# Patient Record
Sex: Female | Born: 1945
Health system: Southern US, Community
[De-identification: ages and names within clinical notes are randomized; demographics above are authoritative.]

## PROBLEM LIST (undated history)

## (undated) DIAGNOSIS — M5430 Sciatica, unspecified side: Secondary | ICD-10-CM

## (undated) DIAGNOSIS — K219 Gastro-esophageal reflux disease without esophagitis: Secondary | ICD-10-CM

## (undated) DIAGNOSIS — J45909 Unspecified asthma, uncomplicated: Secondary | ICD-10-CM

## (undated) DIAGNOSIS — E119 Type 2 diabetes mellitus without complications: Secondary | ICD-10-CM

## (undated) DIAGNOSIS — M1990 Unspecified osteoarthritis, unspecified site: Secondary | ICD-10-CM

## (undated) DIAGNOSIS — M545 Low back pain, unspecified: Secondary | ICD-10-CM

## (undated) DIAGNOSIS — E669 Obesity, unspecified: Secondary | ICD-10-CM

## (undated) DIAGNOSIS — I10 Essential (primary) hypertension: Secondary | ICD-10-CM

## (undated) DIAGNOSIS — R079 Chest pain, unspecified: Secondary | ICD-10-CM

## (undated) DIAGNOSIS — J302 Other seasonal allergic rhinitis: Secondary | ICD-10-CM

## (undated) DIAGNOSIS — H269 Unspecified cataract: Secondary | ICD-10-CM

## (undated) HISTORY — DX: Essential (primary) hypertension: I10

## (undated) HISTORY — DX: Low back pain, unspecified: M54.50

## (undated) HISTORY — DX: Unspecified asthma, uncomplicated: J45.909

## (undated) HISTORY — PX: OTHER SURGICAL HISTORY: SHX169

## (undated) HISTORY — DX: Other seasonal allergic rhinitis: J30.2

## (undated) HISTORY — DX: Type 2 diabetes mellitus without complications: E11.9

## (undated) HISTORY — DX: Low back pain: M54.5

## (undated) HISTORY — DX: Unspecified cataract: H26.9

## (undated) HISTORY — DX: Obesity, unspecified: E66.9

## (undated) HISTORY — DX: Sciatica, unspecified side: M54.30

---
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
## 1968-03-07 HISTORY — PX: TUBAL LIGATION: SHX77

## 2001-04-21 ENCOUNTER — Encounter: Payer: Self-pay | Admitting: Emergency Medicine

## 2001-04-21 ENCOUNTER — Observation Stay (HOSPITAL_COMMUNITY): Admission: EM | Admit: 2001-04-21 | Discharge: 2001-04-21 | Payer: Self-pay | Admitting: Emergency Medicine

## 2002-01-23 ENCOUNTER — Encounter: Payer: Self-pay | Admitting: Internal Medicine

## 2002-01-23 ENCOUNTER — Ambulatory Visit (HOSPITAL_COMMUNITY): Admission: RE | Admit: 2002-01-23 | Discharge: 2002-01-23 | Payer: Self-pay | Admitting: Internal Medicine

## 2002-03-07 DIAGNOSIS — I10 Essential (primary) hypertension: Secondary | ICD-10-CM

## 2002-03-07 DIAGNOSIS — E119 Type 2 diabetes mellitus without complications: Secondary | ICD-10-CM

## 2002-03-07 DIAGNOSIS — K219 Gastro-esophageal reflux disease without esophagitis: Secondary | ICD-10-CM

## 2002-03-07 HISTORY — DX: Gastro-esophageal reflux disease without esophagitis: K21.9

## 2002-03-07 HISTORY — DX: Essential (primary) hypertension: I10

## 2002-03-07 HISTORY — DX: Type 2 diabetes mellitus without complications: E11.9

## 2003-02-03 ENCOUNTER — Ambulatory Visit (HOSPITAL_COMMUNITY): Admission: RE | Admit: 2003-02-03 | Discharge: 2003-02-03 | Payer: Self-pay | Admitting: Family Medicine

## 2004-05-30 ENCOUNTER — Emergency Department (HOSPITAL_COMMUNITY): Admission: EM | Admit: 2004-05-30 | Discharge: 2004-05-30 | Payer: Self-pay | Admitting: Emergency Medicine

## 2004-05-31 ENCOUNTER — Emergency Department (HOSPITAL_COMMUNITY): Admission: EM | Admit: 2004-05-31 | Discharge: 2004-05-31 | Payer: Self-pay | Admitting: *Deleted

## 2004-06-04 ENCOUNTER — Ambulatory Visit: Payer: Self-pay | Admitting: Family Medicine

## 2004-07-16 ENCOUNTER — Ambulatory Visit: Payer: Self-pay | Admitting: Family Medicine

## 2004-08-04 ENCOUNTER — Ambulatory Visit: Payer: Self-pay | Admitting: Family Medicine

## 2004-08-25 ENCOUNTER — Ambulatory Visit: Payer: Self-pay | Admitting: *Deleted

## 2004-08-27 ENCOUNTER — Ambulatory Visit: Payer: Self-pay | Admitting: Family Medicine

## 2004-11-05 ENCOUNTER — Ambulatory Visit: Payer: Self-pay | Admitting: Family Medicine

## 2004-12-31 ENCOUNTER — Ambulatory Visit: Payer: Self-pay | Admitting: Family Medicine

## 2005-01-04 ENCOUNTER — Ambulatory Visit (HOSPITAL_COMMUNITY): Admission: RE | Admit: 2005-01-04 | Discharge: 2005-01-04 | Payer: Self-pay | Admitting: Family Medicine

## 2005-01-04 ENCOUNTER — Encounter: Payer: Self-pay | Admitting: Orthopedic Surgery

## 2005-01-14 ENCOUNTER — Ambulatory Visit: Payer: Self-pay | Admitting: Family Medicine

## 2005-01-20 ENCOUNTER — Ambulatory Visit: Payer: Self-pay | Admitting: Orthopedic Surgery

## 2005-02-01 ENCOUNTER — Encounter: Payer: Self-pay | Admitting: Orthopedic Surgery

## 2005-02-01 ENCOUNTER — Ambulatory Visit: Payer: Self-pay | Admitting: Orthopedic Surgery

## 2005-02-01 ENCOUNTER — Inpatient Hospital Stay (HOSPITAL_COMMUNITY): Admission: RE | Admit: 2005-02-01 | Discharge: 2005-02-05 | Payer: Self-pay | Admitting: Orthopedic Surgery

## 2005-02-01 HISTORY — PX: OTHER SURGICAL HISTORY: SHX169

## 2005-02-21 ENCOUNTER — Ambulatory Visit: Payer: Self-pay | Admitting: Orthopedic Surgery

## 2005-03-22 ENCOUNTER — Encounter (HOSPITAL_COMMUNITY): Admission: RE | Admit: 2005-03-22 | Discharge: 2005-04-21 | Payer: Self-pay | Admitting: Orthopedic Surgery

## 2005-04-18 ENCOUNTER — Ambulatory Visit: Payer: Self-pay | Admitting: Orthopedic Surgery

## 2005-04-22 ENCOUNTER — Ambulatory Visit: Payer: Self-pay | Admitting: Family Medicine

## 2005-04-25 ENCOUNTER — Encounter (HOSPITAL_COMMUNITY): Admission: RE | Admit: 2005-04-25 | Discharge: 2005-05-25 | Payer: Self-pay | Admitting: Orthopedic Surgery

## 2005-05-05 ENCOUNTER — Ambulatory Visit: Payer: Self-pay | Admitting: Family Medicine

## 2005-06-07 ENCOUNTER — Encounter (HOSPITAL_COMMUNITY): Admission: RE | Admit: 2005-06-07 | Discharge: 2005-07-07 | Payer: Self-pay | Admitting: Orthopedic Surgery

## 2005-07-18 ENCOUNTER — Ambulatory Visit: Payer: Self-pay | Admitting: Orthopedic Surgery

## 2005-07-22 ENCOUNTER — Ambulatory Visit: Payer: Self-pay | Admitting: Family Medicine

## 2005-07-25 ENCOUNTER — Ambulatory Visit (HOSPITAL_COMMUNITY): Admission: RE | Admit: 2005-07-25 | Discharge: 2005-07-25 | Payer: Self-pay | Admitting: Family Medicine

## 2005-09-09 ENCOUNTER — Ambulatory Visit: Payer: Self-pay | Admitting: Family Medicine

## 2005-11-04 ENCOUNTER — Ambulatory Visit: Payer: Self-pay | Admitting: Family Medicine

## 2005-12-16 ENCOUNTER — Ambulatory Visit: Payer: Self-pay | Admitting: Family Medicine

## 2006-02-03 ENCOUNTER — Ambulatory Visit: Payer: Self-pay | Admitting: Family Medicine

## 2006-03-06 ENCOUNTER — Ambulatory Visit (HOSPITAL_COMMUNITY): Admission: RE | Admit: 2006-03-06 | Discharge: 2006-03-06 | Payer: Self-pay | Admitting: Family Medicine

## 2006-03-17 ENCOUNTER — Other Ambulatory Visit: Admission: RE | Admit: 2006-03-17 | Discharge: 2006-03-17 | Payer: Self-pay | Admitting: Family Medicine

## 2006-03-17 ENCOUNTER — Encounter: Payer: Self-pay | Admitting: Family Medicine

## 2006-03-17 ENCOUNTER — Ambulatory Visit: Payer: Self-pay | Admitting: Family Medicine

## 2006-03-18 ENCOUNTER — Encounter: Payer: Self-pay | Admitting: Family Medicine

## 2006-03-18 LAB — CONVERTED CEMR LAB
Candida species: POSITIVE — AB
GC Probe Amp, Genital: NEGATIVE
Trichomonal Vaginitis: NEGATIVE

## 2006-03-29 ENCOUNTER — Ambulatory Visit: Payer: Self-pay | Admitting: Family Medicine

## 2006-03-29 ENCOUNTER — Ambulatory Visit (HOSPITAL_COMMUNITY): Admission: RE | Admit: 2006-03-29 | Discharge: 2006-03-29 | Payer: Self-pay | Admitting: Family Medicine

## 2006-03-31 ENCOUNTER — Ambulatory Visit (HOSPITAL_COMMUNITY): Admission: RE | Admit: 2006-03-31 | Discharge: 2006-03-31 | Payer: Self-pay | Admitting: Family Medicine

## 2006-04-13 ENCOUNTER — Ambulatory Visit: Payer: Self-pay | Admitting: Orthopedic Surgery

## 2006-04-17 ENCOUNTER — Encounter: Payer: Self-pay | Admitting: Orthopedic Surgery

## 2006-05-12 ENCOUNTER — Encounter: Payer: Self-pay | Admitting: Family Medicine

## 2006-05-12 LAB — CONVERTED CEMR LAB
ALT: 20 units/L (ref 0–35)
AST: 18 units/L (ref 0–37)
Alkaline Phosphatase: 94 units/L (ref 39–117)
Cholesterol: 169 mg/dL (ref 0–200)
Indirect Bilirubin: 0.2 mg/dL (ref 0.0–0.9)
Total Protein: 7.3 g/dL (ref 6.0–8.3)
Triglycerides: 100 mg/dL (ref <150)
VLDL: 20 mg/dL (ref 0–40)

## 2006-05-19 ENCOUNTER — Ambulatory Visit: Payer: Self-pay | Admitting: Family Medicine

## 2006-07-07 ENCOUNTER — Encounter (INDEPENDENT_AMBULATORY_CARE_PROVIDER_SITE_OTHER): Payer: Self-pay | Admitting: Internal Medicine

## 2006-07-27 ENCOUNTER — Encounter (INDEPENDENT_AMBULATORY_CARE_PROVIDER_SITE_OTHER): Payer: Self-pay | Admitting: *Deleted

## 2006-07-27 ENCOUNTER — Encounter: Payer: Self-pay | Admitting: Family Medicine

## 2006-07-27 ENCOUNTER — Ambulatory Visit (HOSPITAL_COMMUNITY): Admission: RE | Admit: 2006-07-27 | Discharge: 2006-07-27 | Payer: Self-pay | Admitting: Gastroenterology

## 2006-07-27 ENCOUNTER — Ambulatory Visit: Payer: Self-pay | Admitting: Gastroenterology

## 2006-07-27 LAB — CONVERTED CEMR LAB: Pap Smear: NORMAL

## 2006-08-22 ENCOUNTER — Ambulatory Visit: Payer: Self-pay | Admitting: Family Medicine

## 2006-09-22 ENCOUNTER — Ambulatory Visit: Payer: Self-pay | Admitting: Family Medicine

## 2006-11-16 ENCOUNTER — Encounter: Payer: Self-pay | Admitting: Family Medicine

## 2006-11-16 LAB — CONVERTED CEMR LAB
AST: 19 units/L (ref 0–37)
BUN: 16 mg/dL (ref 6–23)
Bilirubin, Direct: 0.1 mg/dL (ref 0.0–0.3)
CO2: 26 meq/L (ref 19–32)
Calcium: 9.6 mg/dL (ref 8.4–10.5)
Cholesterol: 111 mg/dL (ref 0–200)
Creatinine, Ser: 0.9 mg/dL (ref 0.40–1.20)
Glucose, Bld: 122 mg/dL — ABNORMAL HIGH (ref 70–99)
Indirect Bilirubin: 0.3 mg/dL (ref 0.0–0.9)
Microalb, Ur: 1.65 mg/dL (ref 0.00–1.89)
Sodium: 145 meq/L (ref 135–145)
Total Bilirubin: 0.4 mg/dL (ref 0.3–1.2)
Total CHOL/HDL Ratio: 2.8

## 2006-11-24 ENCOUNTER — Ambulatory Visit: Payer: Self-pay | Admitting: Family Medicine

## 2006-11-28 DIAGNOSIS — E1159 Type 2 diabetes mellitus with other circulatory complications: Secondary | ICD-10-CM | POA: Insufficient documentation

## 2007-02-22 ENCOUNTER — Encounter (INDEPENDENT_AMBULATORY_CARE_PROVIDER_SITE_OTHER): Payer: Self-pay | Admitting: *Deleted

## 2007-02-22 ENCOUNTER — Encounter: Payer: Self-pay | Admitting: Family Medicine

## 2007-02-22 LAB — CONVERTED CEMR LAB
ALT: 20 units/L
ALT: 20 units/L (ref 0–35)
AST: 16 units/L (ref 0–37)
Albumin: 4.2 g/dL
Albumin: 4.2 g/dL (ref 3.5–5.2)
Alkaline Phosphatase: 72 units/L (ref 39–117)
BUN: 12 mg/dL
Basophils Relative: 1 % (ref 0–1)
CO2: 25 meq/L
Calcium: 9.1 mg/dL
Chloride: 106 meq/L
Chloride: 106 meq/L (ref 96–112)
Cholesterol: 158 mg/dL
Cholesterol: 158 mg/dL (ref 0–200)
Creatinine, Ser: 0.89 mg/dL
Creatinine, Ser: 0.89 mg/dL (ref 0.40–1.20)
Eosinophils Absolute: 0.3 10*3/uL (ref 0.2–0.7)
HDL: 40 mg/dL
HDL: 40 mg/dL (ref >39)
Hemoglobin: 12.6 g/dL (ref 12.0–15.0)
LDL Cholesterol: 103 mg/dL
LDL Cholesterol: 103 mg/dL — ABNORMAL HIGH (ref 0–99)
MCHC: 33.2 g/dL (ref 30.0–36.0)
MCV: 85.4 fL (ref 78.0–100.0)
Monocytes Absolute: 0.7 10*3/uL (ref 0.1–1.0)
Monocytes Relative: 8 % (ref 3–12)
Neutrophils Relative %: 56 % (ref 43–77)
RBC: 4.45 M/uL (ref 3.87–5.11)
Sodium: 142 meq/L
Total Protein: 7 g/dL (ref 6.0–8.3)
Triglycerides: 76 mg/dL
Triglycerides: 76 mg/dL (ref <150)

## 2007-02-28 ENCOUNTER — Ambulatory Visit: Payer: Self-pay | Admitting: Family Medicine

## 2007-04-06 ENCOUNTER — Ambulatory Visit: Payer: Self-pay | Admitting: Family Medicine

## 2007-04-16 ENCOUNTER — Ambulatory Visit (HOSPITAL_COMMUNITY): Admission: RE | Admit: 2007-04-16 | Discharge: 2007-04-16 | Payer: Self-pay | Admitting: Family Medicine

## 2007-04-27 ENCOUNTER — Ambulatory Visit: Payer: Self-pay | Admitting: Family Medicine

## 2007-06-19 ENCOUNTER — Ambulatory Visit: Payer: Self-pay | Admitting: Family Medicine

## 2007-06-28 ENCOUNTER — Ambulatory Visit: Payer: Self-pay | Admitting: Orthopedic Surgery

## 2007-06-28 DIAGNOSIS — M545 Low back pain: Secondary | ICD-10-CM

## 2007-06-28 DIAGNOSIS — M5430 Sciatica, unspecified side: Secondary | ICD-10-CM

## 2007-06-29 ENCOUNTER — Encounter (INDEPENDENT_AMBULATORY_CARE_PROVIDER_SITE_OTHER): Payer: Self-pay | Admitting: *Deleted

## 2007-07-18 ENCOUNTER — Telehealth (INDEPENDENT_AMBULATORY_CARE_PROVIDER_SITE_OTHER): Payer: Self-pay | Admitting: *Deleted

## 2007-07-20 ENCOUNTER — Ambulatory Visit: Payer: Self-pay | Admitting: Family Medicine

## 2007-08-13 ENCOUNTER — Ambulatory Visit: Payer: Self-pay | Admitting: Orthopedic Surgery

## 2007-08-13 DIAGNOSIS — M25519 Pain in unspecified shoulder: Secondary | ICD-10-CM

## 2007-08-14 ENCOUNTER — Telehealth: Payer: Self-pay | Admitting: Orthopedic Surgery

## 2007-08-17 ENCOUNTER — Telehealth: Payer: Self-pay | Admitting: Orthopedic Surgery

## 2007-08-20 ENCOUNTER — Encounter: Payer: Self-pay | Admitting: Orthopedic Surgery

## 2007-08-28 ENCOUNTER — Ambulatory Visit: Payer: Self-pay | Admitting: Orthopedic Surgery

## 2007-08-28 DIAGNOSIS — M19019 Primary osteoarthritis, unspecified shoulder: Secondary | ICD-10-CM | POA: Insufficient documentation

## 2007-08-28 DIAGNOSIS — M75120 Complete rotator cuff tear or rupture of unspecified shoulder, not specified as traumatic: Secondary | ICD-10-CM | POA: Insufficient documentation

## 2007-08-29 ENCOUNTER — Encounter (HOSPITAL_COMMUNITY): Admission: RE | Admit: 2007-08-29 | Discharge: 2007-09-28 | Payer: Self-pay | Admitting: Orthopedic Surgery

## 2007-08-31 ENCOUNTER — Encounter: Payer: Self-pay | Admitting: Orthopedic Surgery

## 2007-10-03 ENCOUNTER — Encounter: Payer: Self-pay | Admitting: Orthopedic Surgery

## 2007-10-03 ENCOUNTER — Encounter: Admission: RE | Admit: 2007-10-03 | Discharge: 2007-11-02 | Payer: Self-pay | Admitting: Orthopedic Surgery

## 2007-10-24 ENCOUNTER — Ambulatory Visit: Payer: Self-pay | Admitting: Orthopedic Surgery

## 2007-11-05 ENCOUNTER — Encounter (HOSPITAL_COMMUNITY): Admission: RE | Admit: 2007-11-05 | Discharge: 2007-12-03 | Payer: Self-pay | Admitting: Orthopedic Surgery

## 2007-11-20 ENCOUNTER — Ambulatory Visit: Payer: Self-pay | Admitting: Orthopedic Surgery

## 2007-11-26 ENCOUNTER — Encounter: Payer: Self-pay | Admitting: Family Medicine

## 2007-11-29 ENCOUNTER — Telehealth: Payer: Self-pay | Admitting: Orthopedic Surgery

## 2007-12-11 ENCOUNTER — Encounter: Payer: Self-pay | Admitting: Orthopedic Surgery

## 2008-01-04 ENCOUNTER — Encounter: Payer: Self-pay | Admitting: Family Medicine

## 2008-02-08 ENCOUNTER — Encounter: Payer: Self-pay | Admitting: Family Medicine

## 2008-04-01 ENCOUNTER — Telehealth: Payer: Self-pay | Admitting: Orthopedic Surgery

## 2008-06-30 ENCOUNTER — Encounter: Payer: Self-pay | Admitting: Family Medicine

## 2008-09-16 LAB — HM DIABETES EYE EXAM: HM Diabetic Eye Exam: NORMAL

## 2008-10-06 ENCOUNTER — Encounter: Payer: Self-pay | Admitting: Family Medicine

## 2008-11-05 ENCOUNTER — Encounter: Payer: Self-pay | Admitting: Family Medicine

## 2008-12-11 ENCOUNTER — Encounter: Payer: Self-pay | Admitting: Family Medicine

## 2008-12-12 ENCOUNTER — Encounter: Payer: Self-pay | Admitting: Family Medicine

## 2009-03-04 ENCOUNTER — Ambulatory Visit: Payer: Self-pay | Admitting: Family Medicine

## 2009-03-04 DIAGNOSIS — R05 Cough: Secondary | ICD-10-CM

## 2009-03-04 DIAGNOSIS — R0602 Shortness of breath: Secondary | ICD-10-CM | POA: Insufficient documentation

## 2009-03-08 DIAGNOSIS — R5381 Other malaise: Secondary | ICD-10-CM

## 2009-03-08 DIAGNOSIS — I10 Essential (primary) hypertension: Secondary | ICD-10-CM | POA: Insufficient documentation

## 2009-03-08 DIAGNOSIS — R5383 Other fatigue: Secondary | ICD-10-CM

## 2009-03-11 ENCOUNTER — Encounter: Payer: Self-pay | Admitting: Family Medicine

## 2009-03-22 ENCOUNTER — Telehealth: Payer: Self-pay | Admitting: Family Medicine

## 2009-03-23 ENCOUNTER — Encounter (INDEPENDENT_AMBULATORY_CARE_PROVIDER_SITE_OTHER): Payer: Self-pay | Admitting: *Deleted

## 2009-03-24 ENCOUNTER — Telehealth (INDEPENDENT_AMBULATORY_CARE_PROVIDER_SITE_OTHER): Payer: Self-pay | Admitting: *Deleted

## 2009-03-26 ENCOUNTER — Encounter: Payer: Self-pay | Admitting: Family Medicine

## 2009-03-26 ENCOUNTER — Encounter (INDEPENDENT_AMBULATORY_CARE_PROVIDER_SITE_OTHER): Payer: Self-pay | Admitting: *Deleted

## 2009-03-26 LAB — CONVERTED CEMR LAB
AST: 18 units/L
Albumin: 4.4 g/dL
BUN: 14 mg/dL
Calcium: 10.3 mg/dL
Chloride: 101 meq/L
Glucose, Bld: 137 mg/dL
Potassium: 4.6 meq/L
Sodium: 143 meq/L

## 2009-03-30 ENCOUNTER — Ambulatory Visit (HOSPITAL_COMMUNITY): Admission: RE | Admit: 2009-03-30 | Discharge: 2009-03-30 | Payer: Self-pay | Admitting: Family Medicine

## 2009-03-31 ENCOUNTER — Encounter: Payer: Self-pay | Admitting: Family Medicine

## 2009-04-02 LAB — CONVERTED CEMR LAB
ALT: 24 units/L (ref 0–35)
BUN: 14 mg/dL (ref 6–23)
Bilirubin, Direct: 0.1 mg/dL (ref 0.0–0.3)
CO2: 29 meq/L (ref 19–32)
Chloride: 101 meq/L (ref 96–112)
Creatinine, Ser: 0.82 mg/dL (ref 0.40–1.20)
HDL: 52 mg/dL (ref >39)
Hgb A1c MFr Bld: 9.3 % — ABNORMAL HIGH (ref 4.6–6.1)
Indirect Bilirubin: 0.3 mg/dL (ref 0.0–0.9)
Potassium: 4.6 meq/L (ref 3.5–5.3)
Total Bilirubin: 0.4 mg/dL (ref 0.3–1.2)
Triglycerides: 131 mg/dL (ref <150)
VLDL: 26 mg/dL (ref 0–40)

## 2009-04-03 ENCOUNTER — Encounter (INDEPENDENT_AMBULATORY_CARE_PROVIDER_SITE_OTHER): Payer: Self-pay | Admitting: *Deleted

## 2009-04-03 ENCOUNTER — Telehealth: Payer: Self-pay | Admitting: Family Medicine

## 2009-04-10 ENCOUNTER — Ambulatory Visit: Payer: Self-pay | Admitting: Cardiology

## 2009-04-10 ENCOUNTER — Encounter (INDEPENDENT_AMBULATORY_CARE_PROVIDER_SITE_OTHER): Payer: Self-pay | Admitting: *Deleted

## 2009-04-13 ENCOUNTER — Encounter: Payer: Self-pay | Admitting: Cardiology

## 2009-04-14 ENCOUNTER — Encounter: Payer: Self-pay | Admitting: Family Medicine

## 2009-04-28 ENCOUNTER — Ambulatory Visit: Payer: Self-pay | Admitting: Cardiology

## 2009-04-28 ENCOUNTER — Encounter: Payer: Self-pay | Admitting: Cardiology

## 2009-04-28 ENCOUNTER — Encounter: Payer: Self-pay | Admitting: Family Medicine

## 2009-04-28 ENCOUNTER — Ambulatory Visit (HOSPITAL_COMMUNITY): Admission: RE | Admit: 2009-04-28 | Discharge: 2009-04-28 | Payer: Self-pay | Admitting: Cardiology

## 2009-05-11 ENCOUNTER — Encounter (INDEPENDENT_AMBULATORY_CARE_PROVIDER_SITE_OTHER): Payer: Self-pay | Admitting: *Deleted

## 2009-05-12 ENCOUNTER — Encounter (INDEPENDENT_AMBULATORY_CARE_PROVIDER_SITE_OTHER): Payer: Self-pay | Admitting: *Deleted

## 2009-05-12 ENCOUNTER — Ambulatory Visit: Payer: Self-pay | Admitting: Cardiology

## 2009-05-13 ENCOUNTER — Encounter: Payer: Self-pay | Admitting: Cardiology

## 2009-06-02 LAB — CONVERTED CEMR LAB
CO2: 30 meq/L (ref 19–32)
Chloride: 101 meq/L (ref 96–112)
Glucose, Bld: 119 mg/dL — ABNORMAL HIGH (ref 70–99)
Potassium: 4.8 meq/L (ref 3.5–5.3)
Sodium: 144 meq/L (ref 135–145)

## 2009-06-12 ENCOUNTER — Ambulatory Visit: Payer: Self-pay | Admitting: Cardiovascular Disease

## 2009-06-16 ENCOUNTER — Encounter (INDEPENDENT_AMBULATORY_CARE_PROVIDER_SITE_OTHER): Payer: Self-pay | Admitting: *Deleted

## 2009-06-19 ENCOUNTER — Ambulatory Visit: Payer: Self-pay | Admitting: Family Medicine

## 2009-06-19 ENCOUNTER — Other Ambulatory Visit: Admission: RE | Admit: 2009-06-19 | Discharge: 2009-06-19 | Payer: Self-pay | Admitting: Family Medicine

## 2009-06-21 DIAGNOSIS — K219 Gastro-esophageal reflux disease without esophagitis: Secondary | ICD-10-CM | POA: Insufficient documentation

## 2009-06-21 DIAGNOSIS — E669 Obesity, unspecified: Secondary | ICD-10-CM | POA: Insufficient documentation

## 2009-06-23 LAB — CONVERTED CEMR LAB
ALT: 17 units/L (ref 0–35)
AST: 17 units/L (ref 0–37)
BUN: 11 mg/dL (ref 6–23)
CO2: 28 meq/L (ref 19–32)
Chloride: 100 meq/L (ref 96–112)
Creatinine, Ser: 0.75 mg/dL (ref 0.40–1.20)
Creatinine, Urine: 117.3 mg/dL
Indirect Bilirubin: 0.2 mg/dL (ref 0.0–0.9)
Total Protein: 6.9 g/dL (ref 6.0–8.3)
Triglycerides: 57 mg/dL (ref <150)
VLDL: 11 mg/dL (ref 0–40)

## 2009-06-25 ENCOUNTER — Telehealth: Payer: Self-pay | Admitting: Family Medicine

## 2009-07-31 ENCOUNTER — Ambulatory Visit: Payer: Self-pay | Admitting: Family Medicine

## 2009-08-05 ENCOUNTER — Encounter: Payer: Self-pay | Admitting: Family Medicine

## 2009-08-12 LAB — CONVERTED CEMR LAB
BUN: 12 mg/dL (ref 6–23)
Basophils Absolute: 0 10*3/uL (ref 0.0–0.1)
Basophils Relative: 1 % (ref 0–1)
Creatinine, Ser: 0.75 mg/dL (ref 0.40–1.20)
Eosinophils Absolute: 0.2 10*3/uL (ref 0.0–0.7)
Eosinophils Relative: 2 % (ref 0–5)
Glucose, Bld: 150 mg/dL — ABNORMAL HIGH (ref 70–99)
HCT: 36.4 % (ref 36.0–46.0)
Hemoglobin: 12 g/dL (ref 12.0–15.0)
Hgb A1c MFr Bld: 7.1 % — ABNORMAL HIGH (ref <5.7)
Lymphocytes Relative: 27 % (ref 12–46)
MCHC: 33 g/dL (ref 30.0–36.0)
MCV: 82.5 fL (ref 78.0–100.0)
Monocytes Absolute: 0.5 10*3/uL (ref 0.1–1.0)
Platelets: 304 10*3/uL (ref 150–400)
Potassium: 3.9 meq/L (ref 3.5–5.3)
RDW: 15.6 % — ABNORMAL HIGH (ref 11.5–15.5)
TSH: 3.422 microintl units/mL (ref 0.350–4.500)

## 2009-10-09 ENCOUNTER — Ambulatory Visit: Payer: Self-pay | Admitting: Family Medicine

## 2009-10-12 DIAGNOSIS — R490 Dysphonia: Secondary | ICD-10-CM | POA: Insufficient documentation

## 2009-10-12 DIAGNOSIS — J370 Chronic laryngitis: Secondary | ICD-10-CM

## 2009-10-21 ENCOUNTER — Telehealth: Payer: Self-pay | Admitting: Emergency Medicine

## 2009-11-20 LAB — CONVERTED CEMR LAB
ALT: 17 units/L (ref 0–35)
Albumin: 4.2 g/dL (ref 3.5–5.2)
Alkaline Phosphatase: 81 units/L (ref 39–117)
BUN: 15 mg/dL (ref 6–23)
Cholesterol: 163 mg/dL (ref 0–200)
Creatinine, Ser: 0.91 mg/dL (ref 0.40–1.20)
Glucose, Bld: 162 mg/dL — ABNORMAL HIGH (ref 70–99)
HDL: 56 mg/dL (ref >39)
Indirect Bilirubin: 0.2 mg/dL (ref 0.0–0.9)
LDL Cholesterol: 89 mg/dL (ref 0–99)
Total Protein: 6.9 g/dL (ref 6.0–8.3)
Triglycerides: 92 mg/dL (ref <150)
VLDL: 18 mg/dL (ref 0–40)

## 2009-11-27 ENCOUNTER — Ambulatory Visit: Payer: Self-pay | Admitting: Family Medicine

## 2009-11-27 DIAGNOSIS — F329 Major depressive disorder, single episode, unspecified: Secondary | ICD-10-CM | POA: Insufficient documentation

## 2009-11-27 DIAGNOSIS — J45991 Cough variant asthma: Secondary | ICD-10-CM

## 2009-12-08 ENCOUNTER — Telehealth: Payer: Self-pay | Admitting: Family Medicine

## 2009-12-17 ENCOUNTER — Telehealth: Payer: Self-pay | Admitting: Family Medicine

## 2009-12-22 ENCOUNTER — Telehealth: Payer: Self-pay | Admitting: Family Medicine

## 2010-01-01 ENCOUNTER — Encounter: Payer: Self-pay | Admitting: Family Medicine

## 2010-01-05 ENCOUNTER — Telehealth: Payer: Self-pay | Admitting: Family Medicine

## 2010-02-11 ENCOUNTER — Encounter (INDEPENDENT_AMBULATORY_CARE_PROVIDER_SITE_OTHER): Payer: Self-pay | Admitting: *Deleted

## 2010-03-19 ENCOUNTER — Encounter: Payer: Self-pay | Admitting: Family Medicine

## 2010-03-19 ENCOUNTER — Ambulatory Visit
Admission: RE | Admit: 2010-03-19 | Discharge: 2010-03-19 | Payer: Self-pay | Source: Home / Self Care | Attending: Family Medicine | Admitting: Family Medicine

## 2010-03-22 LAB — CONVERTED CEMR LAB
CO2: 28 meq/L (ref 19–32)
Calcium: 9.8 mg/dL (ref 8.4–10.5)
Chloride: 101 meq/L (ref 96–112)
Creatinine, Ser: 0.77 mg/dL (ref 0.40–1.20)
Glucose, Bld: 146 mg/dL — ABNORMAL HIGH (ref 70–99)
Sodium: 141 meq/L (ref 135–145)

## 2010-03-27 ENCOUNTER — Encounter: Payer: Self-pay | Admitting: Family Medicine

## 2010-03-28 ENCOUNTER — Encounter: Payer: Self-pay | Admitting: Family Medicine

## 2010-03-28 ENCOUNTER — Encounter: Payer: Self-pay | Admitting: Orthopedic Surgery

## 2010-03-31 ENCOUNTER — Encounter: Payer: Self-pay | Admitting: Family Medicine

## 2010-04-06 NOTE — Letter (Signed)
Summary: Stress Echocardiogram Information Sheet  [facility]  [address], [place] [address]   Phone: [phone]  Fax: [phone]      April 10, 2009 MRN: [id number] light prior to the test.   Tiffany Jacobs  Doctor: Appointment Date: Appointment Time: Appointment Location: [facility] ([place])  Stress Echocardiogram Information Sheet    Instructions:   1. DO NOT  take your metformin,amlodipine,and glipizide medicine   the morning befor the test.  2. Eat light prior to the test.  3. Dress prepared to exercise.  4. DO NOT use ANY caffine or tobacco products 3 hours before appointment.  5. Report to the Short Stay Center on the1st floor.  6. Please bring all current prescription medications.  7. If you have any questions, please call [phone]

## 2010-04-06 NOTE — Assessment & Plan Note (Signed)
Summary: follow up/slj   Vital Signs:  Patient profile:   65 year old female Menstrual status:  postmenopausal Height:      62.5 inches Weight:      233.25 pounds BMI:     42.13 O2 Sat:      94 % Pulse rate:   94 / minute Pulse rhythm:   regular Resp:     16 per minute BP sitting:   140 / 90  (left arm) Cuff size:   large  Vitals Entered By: Everitt Amber LPN (November 27, 2009 9:30 AM)  Nutrition Counseling: Patient's BMI is greater than 25 and therefore counseled on weight management options. CC: Follow up chronic problems   Primary Care Provider:  Syliva Overman MD  CC:  Follow up chronic problems.  History of Present Illness: Reports  that she has not been doing well. She has not been testing her sugars, no strips. she is gainng more weight, overeats to deal with stress. she often feels overwhelmed becauseof family issues and stress, none of her children are currently employed.She does not sleep well. Denies recent fever or chills. Denies sinus pressure, nasal congestion , ear pain or sore throat. Denies chest congestion, or cough productive of sputum. Denies chest pain, palpitations, PND, orthopnea or leg swelling. Denies abdominal pain, nausea, vomitting, diarrhea or constipation. Denies change in bowel movements or bloody stool. Denies dysuria , frequency, incontinence or hesitancy. Denies  joint pain, swelling, or reduced mobility. Denies headaches, vertigo, seizures.  Denies  rash, lesions, or itch.     Current Medications (verified): 1)  Aspirin 81 Mg  Tbec (Aspirin) .... One Tab By Mouth Once Daily 2)  Metformin Hcl 1000 Mg  Tabs (Metformin Hcl) .... One Tab By Mouth Two Times A Day 3)  Amlodipine Besylate 10 Mg Tabs (Amlodipine Besylate) .... Take 1 Tablet By Mouth Once A Day 4)  Vitamin D 400 Unit Tabs (Cholecalciferol) .... Take 1 Tab Daily 5)  Ipratropium-Albuterol 0.5-2.5 (3) Mg/57ml Soln (Ipratropium-Albuterol) .... One Vial Three Times A Day As  Needed For 1 Month 6)  Glipizide 5 Mg Tabs (Glipizide) .... One and A Half Tablets Twice Daily 7)  Zolpidem Tartrate 10 Mg Tabs (Zolpidem Tartrate) .... Take 1 Tab By Mouth At Bedtime 8)  Maxzide-25 37.5-25 Mg Tabs (Triamterene-Hctz) .... Take 1 Tablet By Mouth Once A Day 9)  Pravastatin Sodium 40 Mg Tabs (Pravastatin Sodium) .... Take 1 Tab By Mouth At Bedtime 10)  Qvar 80 Mcg/act Aers (Beclomethasone Dipropionate) .... One Inhalation Twice Daily  Allergies (verified): No Known Drug Allergies  Review of Systems      See HPI General:  Complains of fatigue and sleep disorder. Eyes:  Denies discharge, eye pain, and red eye. MS:  Complains of joint pain and stiffness. Neuro:  Complains of headaches; denies poor balance, seizures, and sensation of room spinning. Psych:  Complains of anxiety, depression, easily tearful, and irritability; denies suicidal thoughts/plans, thoughts of violence, and unusual visions or sounds. Endo:  Complains of excessive thirst and excessive urination. Allergy:  Complains of seasonal allergies.  Physical Exam  General:  Well-developed,obese,in no acute distress; alert,appropriate and cooperative throughout examination HEENT: No facial asymmetry,  EOMI, No sinus tenderness, TM's Clear, oropharynx  pink and moist.   Chest: Clear to auscultation bilaterally.  CVS: S1, S2, No murmurs, No S3.   Abd: Soft, Nontender.  MS: Adequate ROM spine, hips, shoulders and knees.  Ext: No edema.   CNS: CN 2-12 intact, power tone and  sensation normal throughout.   Skin: Intact, no visible lesions or rashes.  Psych: Good eye contact, flat affect.  Memory intact,  depressed appearing.    Impression & Recommendations:  Problem # 1:  DEPRESSION (ICD-311) Assessment Deteriorated  Her updated medication list for this problem includes:    Fluoxetine Hcl 10 Mg Caps (Fluoxetine hcl) .Marland Kitchen... Take 1 capsule by mouth once a day  Problem # 2:  OBESITY (ICD-278.00) Assessment:  Deteriorated  Ht: 62.5 (11/27/2009)   Wt: 233.25 (11/27/2009)   BMI: 42.13 (11/27/2009) therapeutic lifestyle change discussed and encouraged  Problem # 3:  DIABETES (ICD-250.00) Assessment: Deteriorated  The following medications were removed from the medication list:    Glipizide 5 Mg Tabs (Glipizide) ..... One and a half tablets twice daily Her updated medication list for this problem includes:    Aspirin 81 Mg Tbec (Aspirin) ..... One tab by mouth once daily    Metformin Hcl 1000 Mg Tabs (Metformin hcl) ..... One tab by mouth two times a day    Glipizide 10 Mg Tabs (Glipizide) .Marland Kitchen... Take 1 tablet by mouth two times a day Patient advised to reduce carbs and sweets, commit to regular physical activity, take meds as prescribed, test blood sugars as directed, and attempt to lose weight , to improve blood sugar control.  Labs Reviewed: Creat: 0.91 (11/19/2009)     Last Eye Exam: normal (09/16/2008) Reviewed HgBA1c results: 8.2 (11/19/2009)  7.1 (08/12/2009)  Problem # 4:  ESSENTIAL HYPERTENSION (ICD-401.9) Assessment: Deteriorated  Her updated medication list for this problem includes:    Amlodipine Besylate 10 Mg Tabs (Amlodipine besylate) .Marland Kitchen... Take 1 tablet by mouth once a day    Maxzide-25 37.5-25 Mg Tabs (Triamterene-hctz) .Marland Kitchen... Take 1 tablet by mouth once a day  BP today: 140/90 Prior BP: 130/80 (10/09/2009)  Prior 10 Yr Risk Heart Disease: 24 % (03/04/2009)  Labs Reviewed: K+: 4.4 (11/19/2009) Creat: : 0.91 (11/19/2009)   Chol: 163 (11/19/2009)   HDL: 56 (11/19/2009)   LDL: 89 (11/19/2009)   TG: 92 (11/19/2009)  Problem # 5:  COUGH VARIANT ASTHMA (ICD-493.82) Assessment: Improved  The following medications were removed from the medication list:    Proair Hfa 108 (90 Base) Mcg/act Aers (Albuterol sulfate) ..... One puff two times a day as needed Her updated medication list for this problem includes:    Ipratropium-albuterol 0.5-2.5 (3) Mg/41ml Soln  (Ipratropium-albuterol) ..... One vial three times a day as needed for 1 month    Qvar 80 Mcg/act Aers (Beclomethasone dipropionate) ..... One inhalation twice daily  Complete Medication List: 1)  Aspirin 81 Mg Tbec (Aspirin) .... One tab by mouth once daily 2)  Metformin Hcl 1000 Mg Tabs (Metformin hcl) .... One tab by mouth two times a day 3)  Amlodipine Besylate 10 Mg Tabs (Amlodipine besylate) .... Take 1 tablet by mouth once a day 4)  Vitamin D 400 Unit Tabs (Cholecalciferol) .... Take 1 tab daily 5)  Ipratropium-albuterol 0.5-2.5 (3) Mg/24ml Soln (Ipratropium-albuterol) .... One vial three times a day as needed for 1 month 6)  Zolpidem Tartrate 10 Mg Tabs (Zolpidem tartrate) .... Take 1 tab by mouth at bedtime 7)  Maxzide-25 37.5-25 Mg Tabs (Triamterene-hctz) .... Take 1 tablet by mouth once a day 8)  Pravastatin Sodium 40 Mg Tabs (Pravastatin sodium) .... Take 1 tab by mouth at bedtime 9)  Qvar 80 Mcg/act Aers (Beclomethasone dipropionate) .... One inhalation twice daily 10)  Fluoxetine Hcl 10 Mg Caps (Fluoxetine hcl) .... Take 1 capsule  by mouth once a day 11)  Glipizide 10 Mg Tabs (Glipizide) .... Take 1 tablet by mouth two times a day  Other Orders: Influenza Vaccine NON MCR (60454) Pneumococcal Vaccine ([address]) Admin 1st Vaccine (91478) Admin 1st Vaccine [facility]) [id number])  Patient Instructions: 1)  Please schedule a follow-up appointment in 2 months. 2)  It is important that you exercise regularly at least 20 minutes 5 times a week. If you develop chest pain, have severe difficulty breathing, or feel very tired , stop exercising immediately and seek medical attention. 3)  You need to lose weight. Consider a lower calorie diet and regular exercise.  4)  pls make eating changes as we discussed. 5)  you need to check your sugars every day. 6)  with your new nerve med i believe you will eat less and feel better 7)  The medication list was reviewed and reconciled..All changed/newly  prescribed medications were explained. A complete medication list was provided to the patient/caregiver.  8)    Prescriptions: QVAR 80 MCG/ACT AERS (BECLOMETHASONE DIPROPIONATE) one inhalation twice daily  #1 x 0   Entered by:   Everitt Amber LPN   Authorized by:   Syliva Overman MD   Signed by:   Syliva Overman MD on 11/27/2009   Method used:   Samples Given   RxID:   [id number] GLIPIZIDE 10 MG TABS (GLIPIZIDE) Take 1 tablet by mouth two times a day  #60 x 3   Entered and Authorized by:   Syliva Overman MD   Signed by:   Syliva Overman MD on 11/27/2009   Method used:   Printed then faxed to ...       [facility]* (retail)       [address]       [place], [place]  [address]       Ph: [phone]       Fax: [phone]   RxID:   [id number] FLUOXETINE HCL 10 MG CAPS (FLUOXETINE HCL) Take 1 capsule by mouth once a day  #30 x 3   Entered and Authorized by:   Syliva Overman MD   Signed by:   Syliva Overman MD on 11/27/2009   Method used:   Electronically to        [facility] (retail)       [address]       [place], [place]  [address]       Ph: [phone]       Fax: [phone]   RxID:   [id number]     Pneumovax    Vaccine Type: Pneumovax    Site: right deltoid    Mfr: Merck    Dose: 0.5 ml    Route: IM    Given by: Everitt Amber LPN    Exp. Date: 05/19/2011    Lot #: [id number]  Influenza Vaccine    Vaccine Type: Fluvax Non-MCR    Site: right deltoid    Mfr: novartis     Dose: 0.5 ml    Route: IM    Given by: Everitt Amber LPN    Exp. Date: 07/2010    Lot #: [id number]

## 2010-04-06 NOTE — Progress Notes (Signed)
Summary: diabetic supplies  Phone Note Call from Patient   Summary of Call: [phone] is [facility] number, they haven't gotten the supply prescription, please send to this number. Initial call taken by: Curtis Sites,  January 05, 2010 3:20 PM    Prescriptions: ACCU-CHEK MULTICLIX LANCETS  MISC (LANCETS) once daily testing dx:250.00  #100 x 1   Entered by:   Adella Hare LPN   Authorized by:   Syliva Overman MD   Signed by:   Adella Hare LPN on 45/40/9811   Method used:   Electronically to        [facility] (retail)       [address] [place], [place]  [address]       Ph: [phone]       Fax: [phone]   RxID:   [id number] ACCU-CHEK COMPACT TEST DRUM  STRP (GLUCOSE BLOOD) once daily testing dx:250.00  #100 x 1   Entered by:   Adella Hare LPN   Authorized by:   Syliva Overman MD   Signed by:   Adella Hare LPN on 66/44/0347   Method used:   Electronically to        [facility] (retail)       [address] [place], [place]  [address]       Ph: [phone]       Fax: [phone]   RxID:   [id number]

## 2010-04-06 NOTE — Assessment & Plan Note (Signed)
Summary: ROV   Visit Type:  Follow-up Primary Provider:  Syliva Overman MD   History of Present Illness: Return visit as scheduled for this very pleasant 65 year old woman with obesity, multiple cardiovascular risk factors and exertional dyspnea.  Since her last visit, she has done better.  She walks on a regular basis without much difficulty.  She is managing her diet better, but has not yet lost weight.  Consultation with a dietitian is pending.  She has had no chest discomfort.  Stress echocardiogram was performed and demonstrated normal resting left ventricular systolic function, impaired exercise capacity and a normal electrocardiographic and echocardiographic response to exercise.  None of her previous medical records are currently available for review.   Both previous [facility] cardiology assessments are stored off site and are being sought.  [facility] records located with the patient's first name spelled Tiffany Jacobs.  She presented to [facility] with chest pain, weakness, diaphoresis, and inferior ST segment depression.  Cigarette consumption was 1/4 pack per day.  Lipids were mildly elevated.  Cardiac markers were positive with total CK of 670 and MB of 34.  Cardiac catheterization showed normal coronary arteries, normal intracardiac pressures and normal left ventricular function.  Current Medications (verified): 1)  Aspirin 81 Mg  Tbec (Aspirin) .... One Tab By Mouth Once Daily 2)  Proair Hfa 108 (90 Base) Mcg/act  Aers (Albuterol Sulfate) .... One Puff Two Times A Day As Needed 3)  Metformin Hcl 1000 Mg  Tabs (Metformin Hcl) .... One Tab By Mouth Two Times A Day 4)  Amlodipine Besylate 10 Mg Tabs (Amlodipine Besylate) .... Take 1 Tablet By Mouth Once A Day 5)  Simvastatin 40 Mg Tabs (Simvastatin) .... One Tab By Mouth Qhs 6)  Glipizide 5 Mg Tabs (Glipizide) .... Take  1 Tab Two Times A Day 7)  Vitamin B Plus+ 8-6-725 Mg-Mcg-Mg Caps (Vit B6-Vit B12-Omega 3 Acids) ....  Take 1 Tab Daily 8)  Vitamin D 400 Unit Tabs (Cholecalciferol) .... Take 1 Tab Daily 9)  Ipratropium-Albuterol 0.5-2.5 (3) Mg/14ml Soln (Ipratropium-Albuterol) .... One Vial Three Times A Day As Needed For 1 Month 10)  Ambien 10 Mg Tabs (Zolpidem Tartrate) .... Take 1 Tab Qod 11)  Prilosec 20 Mg Cpdr (Omeprazole) .... Take 1 Tab Two Times A Day 12)  Losartan Potassium-Hctz 100-12.5 Mg Tabs (Losartan Potassium-Hctz) .... Take 1 Tablet By Mouth Once A Day  Allergies (verified): No Known Drug Allergies  Past History:  PMH, FH, and Social History reviewed and updated.  Review of Systems  The patient denies anorexia, weight loss, weight gain, vision loss, decreased hearing, hoarseness, chest pain, syncope, dyspnea on exertion, peripheral edema, prolonged cough, headaches, hemoptysis, and abdominal pain.    Vital Signs:  Patient profile:   65 year old female Weight:      224 pounds Pulse rate:   102 / minute BP sitting:   145 / 79  (right arm)  Vitals Entered By: Dreama Saa, CNA (May 12, 2009 11:18 AM)  Physical Exam  General:    Well-developed; no acute distress; obese Neck-No JVD; no carotid bruits: Lungs-No tachypnea, clear without rales, rhonchi or wheezes: CV-normal PMI; normal S1 and S2; S4 present Abdomen-BS normal; soft and non-tender without masses or organomegaly: MS-No deformities, cyanosis or clubbing: Neurologic-Nl cranial nerves; symmetric strength and tone: Skin- Warm, no sig. lesions: Extremities-Nl distal pulses; no edema    Impression & Recommendations:  Problem # 1:  ? of ATHEROSCLEROTIC CARDIOVASCULAR DISEASE (ICD-429.2) She  has no coronary artery disease, but did suffer a minimal myocardial infarction, the mechanism of which is unclear.  A negative stress echocardiogram provides additional evidence against significant coronary disease.  We are continuing to search for all pertinent records.  Problem # 2:  DYSPNEA (ICD-786.05) Symptoms are improved  with increased exercise.  I explained to patient that she will improve even more with weight loss.  She appears motivated to institute lifestyle changes.  Problem # 3:  ESSENTIAL HYPERTENSION (ICD-401.9) Blood pressure control remains suboptimal, especially in the setting of diabetes.  Hydrochlorothiazide will be added to losartan in the form of a combination product, with the dose of diuretic adjusted as necessary.  Patient will attempt to collect blood pressures out of the office.  She will return for a nursing visit in one month and to see me again in 6 months.  Other Orders: Durable Medical Equipment (DME) Future Orders: T-Basic Metabolic Panel [id number]) ... 06/02/2009  EKG  Procedure date:  05/12/2009  Findings:      Normal sinus rhythm Left axis deviation Somewhat delayed R wave progression Nonspecific T wave abnormality No prior tracing for comparison.   Patient Instructions: 1)  Your physician recommends that you schedule a follow-up appointment in: 6 MONTHS 2)  Your physician recommends that you return for lab work in: 3 WEEKS 3)  Your physician has recommended you make the following change in your medication:  CHANGE PLAIN LOSARTAN TO LOSARTAN/HCTZ 100/12.5 DAILY 4)  You have been referred to NURSE VISIT IN 1 MONTH 5)  Your physician has requested that you regularly monitor and record your blood pressure readings at home.  Please use the same machine at the same time of day to check your readings and record them to bring to your follow-up visit. Prescriptions: LOSARTAN POTASSIUM-HCTZ 100-12.5 MG TABS (LOSARTAN POTASSIUM-HCTZ) Take 1 tablet by mouth once a day  #30 x 6   Entered by:   Teressa Lower RN   Authorized by:   Kathlen Brunswick, MD, [facility]   Signed by:   Teressa Lower RN on 05/12/2009   Method used:   Electronically to        [facility] (retail)       [address]       [place], [place]  [address]       Ph: [phone]        Fax: [phone]   RxID:   [id number]

## 2010-04-06 NOTE — Progress Notes (Signed)
Summary: APPTS [facility]  Phone Note Call from Patient   Summary of Call: NEEDS HER PUL FUNCTION TEST AND MAMMO [facility] AND CALL HER WITH THEM Initial call taken by: Lind Guest,  March 24, 2009 9:54 AM  Follow-up for Phone Call        pt has been rescheduled for 04/03/2009 10:00. pt also has appt for a mammo for 03/30/2009 5:50. left message for pt to call back foor appt times.  Follow-up by: Rudene Anda,  March 24, 2009 2:10 PM

## 2010-04-06 NOTE — Letter (Signed)
Summary: Generic Letter  [facility]  [address], [place] [address]   Phone: [phone]  Fax: [phone]        June 16, 2009 MRN: [id number]    [facility] [address] [place], [place]  [address]    Dear Ms. Hodgdon,  This is some information on a DASH diet. Dash stands for Dietary Approaches to Stop Hypertension.  Please refer to this diet to help with your blood pressure. We will not be making any other changes at this time, per Dr. Dietrich Pates.      Sincerely Cristoval Teall RN  This letter has been electronically signed by your physician.

## 2010-04-06 NOTE — Miscellaneous (Signed)
Summary: labs 02/22/2007 bmp,lipids,liver   Clinical Lists Changes  Observations: Added new observation of CALCIUM: 9.1 mg/dL (52/84/1324 40:10) Added new observation of ALBUMIN: 4.2 g/dL (27/25/3664 40:34) Added new observation of PROTEIN, TOT: 7.0 g/dL (74/25/9563 87:56) Added new observation of SGPT (ALT): 20 units/L (02/22/2007 15:06) Added new observation of SGOT (AST): 16 units/L (02/22/2007 15:06) Added new observation of ALK PHOS: 72 units/L (02/22/2007 15:06) Added new observation of BILI DIRECT: 0.1 mg/dL (43/32/9518 84:16) Added new observation of CREATININE: 0.89 mg/dL (60/63/0160 10:93) Added new observation of BUN: 12 mg/dL (23/55/7322 02:54) Added new observation of BG RANDOM: 158 mg/dL (27/08/2374 28:31) Added new observation of CO2 PLSM/SER: 25 meq/L (02/22/2007 15:06) Added new observation of CL SERUM: 106 meq/L (02/22/2007 15:06) Added new observation of K SERUM: 4.4 meq/L (02/22/2007 15:06) Added new observation of NA: 142 meq/L (02/22/2007 15:06) Added new observation of LDL: 103 mg/dL (51/76/1607 37:10) Added new observation of HDL: 40 mg/dL (62/69/4854 62:70) Added new observation of TRIGLYC TOT: 76 mg/dL (35/00/9381 82:99) Added new observation of CHOLESTEROL: 158 mg/dL (37/16/9678 93:81)

## 2010-04-06 NOTE — Assessment & Plan Note (Signed)
Summary: nurse visit  Nurse Visit   Vital Signs:  Patient profile:   65 year old female Height:      62.5 inches Weight:      228 pounds O2 Sat:      95 % on Room air Pulse rate:   101 / minute BP sitting:   123 / 66  (right arm)  Vitals Entered By: Dreama Saa, CNA (June 12, 2009 4:25 PM)  O2 Flow:  Room air  Visit Type:  1 month nurse visit Primary Provider:  Syliva Overman MD   History of Present Illness: S: 1 month  bp check B: last ov 05/12/2009, changed losartan to losartan/hct 100/12.5 once daily, bmp 06/02/2009-wnl except glucose 119 A: no  c/o, brought bp diary 31 reading, scanned into emr     average hr   87     average bp   135/90     hr > 100   2    sbp>150    2    dbp>90     16  dbp> 100   3 R:gave pt the dash diet Teressa Lower RN  June 15, 2009 11:17 AM    Current Medications (verified): 1)  Aspirin 81 Mg  Tbec (Aspirin) .... One Tab By Mouth Once Daily 2)  Proair Hfa 108 (90 Base) Mcg/act  Aers (Albuterol Sulfate) .... One Puff Two Times A Day As Needed 3)  Metformin Hcl 1000 Mg  Tabs (Metformin Hcl) .... One Tab By Mouth Two Times A Day 4)  Amlodipine Besylate 10 Mg Tabs (Amlodipine Besylate) .... Take 1 Tablet By Mouth Once A Day 5)  Simvastatin 40 Mg Tabs (Simvastatin) .... One Tab By Mouth Qhs 6)  Glipizide 5 Mg Tabs (Glipizide) .... Take  1 Tab Two Times A Day 7)  Vitamin B Plus+ 8-6-725 Mg-Mcg-Mg Caps (Vit B6-Vit B12-Omega 3 Acids) .... Take 1 Tab Daily 8)  Vitamin D 400 Unit Tabs (Cholecalciferol) .... Take 1 Tab Daily 9)  Ipratropium-Albuterol 0.5-2.5 (3) Mg/49ml Soln (Ipratropium-Albuterol) .... One Vial Three Times A Day As Needed For 1 Month 10)  Ambien 10 Mg Tabs (Zolpidem Tartrate) .... Take 1 Tab Qod 11)  Prilosec 20 Mg Cpdr (Omeprazole) .... Take 1 Tab Two Times A Day 12)  Losartan Potassium-Hctz 100-12.5 Mg Tabs (Losartan Potassium-Hctz) .... Take 1 Tablet By Mouth Once A Day 13)  Fish Oil 1000 Mg Caps (Omega-3 Fatty Acids) .... Take  1 Cap Two Times A Day  Allergies (verified): No Known Drug Allergies

## 2010-04-06 NOTE — Progress Notes (Signed)
  Phone Note Call from Patient   Caller: Patient Summary of Call: can we send in zolpidem for this patient, states she discussed insomnia with dr at recent ov. Initial call taken by: Adella Hare LPN,  June 25, 2009 2:30 PM  Follow-up for Phone Call        advise ok I will erx, pls fax, pls ensure you go ovwer good slleep hygiene with her also Follow-up by: Syliva Overman MD,  June 25, 2009 2:39 PM  Additional Follow-up for Phone Call Additional follow up Details #1::        Sent RX in, called patient and left message Additional Follow-up by: Everitt Amber LPN,  June 25, 2009 2:49 PM    Additional Follow-up for Phone Call Additional follow up Details #2::    called pt left message Follow-up by: Everitt Amber LPN,  June 25, 2009 4:40 PM  Additional Follow-up for Phone Call Additional follow up Details #3:: Details for Additional Follow-up Action Taken: cannot reach pt. will notify her if she calls Additional Follow-up by: Everitt Amber LPN,  June 29, 2009 2:32 PM  New/Updated Medications: ZOLPIDEM TARTRATE 10 MG TABS (ZOLPIDEM TARTRATE) Take 1 tab by mouth at bedtime Prescriptions: ZOLPIDEM TARTRATE 10 MG TABS (ZOLPIDEM TARTRATE) Take 1 tab by mouth at bedtime  #30 x 3   Entered and Authorized by:   Syliva Overman MD   Signed by:   Syliva Overman MD on 06/25/2009   Method used:   Printed then faxed to ...       [facility] (retail)       [address]       [place], [place]  [address]       Ph: [phone]       Fax: [phone]   RxID:   [id number]

## 2010-04-06 NOTE — Letter (Signed)
Summary: Appointment - Reminder 2  [facility]. [address] [place] [address]   Phone: [phone]  Fax: [phone]     February 11, 2010 MRN: [id number]   [facility] [address] [place], [place]  [address]   Dear Tiffany Jacobs,  Our records indicate that it is time to schedule a follow-up appointment.  Dr. Dietrich Pates        recommended that you follow up with [place] in    11/2009 PAST DUE        . It is very important that we reach you to schedule this appointment. We look forward to participating in your health care needs. Please contact us at the number listed above at your earliest convenience to schedule your appointment.  If you are unable to make an appointment at this time, give [place] a call so we can update our records.     Sincerely,   Glass blower/designer

## 2010-04-06 NOTE — Assessment & Plan Note (Signed)
Summary: fup   Vital Signs:  Patient profile:   65 year old female Menstrual status:  postmenopausal Height:      62.5 inches Weight:      226 pounds BMI:     40.82 O2 Sat:      94 % Pulse rate:   86 / minute Pulse rhythm:   regular Resp:     16 per minute BP sitting:   122 / 82  (left arm) Cuff size:   large  Vitals Entered By: Everitt Amber LPN (Jul 31, 2009 9:07 AM)  Nutrition Counseling: Patient's BMI is greater than 25 and therefore counseled on weight management options. CC: Follow up chronic problems   Primary Care Provider:  Syliva Overman MD  CC:  Follow up chronic problems.  History of Present Illness: Pt comes in for f/u with many of her chronic problems unchanged. sh saw Dr . Juanetta Gosling once , states she was waiting on our office to sched a f/u , and she continues to coughj. I explained that it was her responsibility to make the appt, and she states that she will. Her blood sugARS ARE IMPOROVED, BUT STILL HIGHER THAN DESIRED, AND SHE IS FRUSTRATED BY HER INABILITY TO LOSE WEIGHT. She reports extremely poor sleep, and is requestring help for this. he denies any recent fever or chills. She deneis head or chest congestion, dysuria or frequency.  Current Medications (verified): 1)  Aspirin 81 Mg  Tbec (Aspirin) .... One Tab By Mouth Once Daily 2)  Proair Hfa 108 (90 Base) Mcg/act  Aers (Albuterol Sulfate) .... One Puff Two Times A Day As Needed 3)  Metformin Hcl 1000 Mg  Tabs (Metformin Hcl) .... One Tab By Mouth Two Times A Day 4)  Amlodipine Besylate 10 Mg Tabs (Amlodipine Besylate) .... Take 1 Tablet By Mouth Once A Day 5)  Simvastatin 40 Mg Tabs (Simvastatin) .... One Tab By Mouth Qhs 6)  Vitamin D 400 Unit Tabs (Cholecalciferol) .... Take 1 Tab Daily 7)  Ipratropium-Albuterol 0.5-2.5 (3) Mg/78ml Soln (Ipratropium-Albuterol) .... One Vial Three Times A Day As Needed For 1 Month 8)  Prilosec 20 Mg Cpdr (Omeprazole) .... Take 1 Tab Two Times A Day 9)  Losartan  Potassium-Hctz 100-12.5 Mg Tabs (Losartan Potassium-Hctz) .... Take 1 Tablet By Mouth Once A Day 10)  Glipizide 5 Mg Tabs (Glipizide) .... One and A Half Tablets Twice Daily 11)  Zolpidem Tartrate 10 Mg Tabs (Zolpidem Tartrate) .... Take 1 Tab By Mouth At Bedtime  Allergies (verified): No Known Drug Allergies  Review of Systems      See HPI General:  Complains of sleep disorder; difficulty falling and staying asleep, sleep hygiene discusesed. Eyes:  Denies discharge and red eye. ENT:  Denies earache, hoarseness, nasal congestion, sinus pressure, and sore throat. CV:  Denies chest pain or discomfort, palpitations, and swelling of feet. Resp:  Complains of cough; still bothered with cough, needs tomake f/u with pulmonary. GI:  Denies abdominal pain, constipation, diarrhea, nausea, and vomiting. GU:  Denies dysuria and urinary frequency. MS:  Complains of joint pain and stiffness. Derm:  Complains of lesion(s); painful callous under right 5th toe wants diabetic shoes. Neuro:  Complains of headaches; denies poor balance, seizures, and sensation of room spinning. Psych:  Complains of anxiety; denies depression. Endo:  Denies excessive thirst and excessive urination; fastingss range from 130 to 150, has log and meter with her. Heme:  Denies abnormal bruising and bleeding. Allergy:  Denies hives or rash and itching  eyes.  Physical Exam  General:  Well-developed,obese,in no acute distress; alert,appropriate and cooperative throughout examination HEENT: No facial asymmetry,  EOMI, No sinus tenderness, TM's Clear, oropharynx  pink and moist.   Chest: Clear to auscultation bilaterally.  CVS: S1, S2, No murmurs, No S3.   Abd: Soft, Nontender.  ZO:[id number]  ROM spine, hips, shoulders and knees.  Ext: No edema.   CNS: CN 2-12 intact, power tone and sensation normal throughout.   Skin: Intact, no visible lesions or rashes.  Psych: Good eye contact, normal affect.  Memory intact, not anxious or  depressed appearing.    Impression & Recommendations:  Problem # 1:  OBESITY (ICD-278.00) Assessment Unchanged  Ht: 62.5 (07/31/2009)   Wt: 226 (07/31/2009)   BMI: 40.82 (07/31/2009)  Problem # 2:  ESSENTIAL HYPERTENSION (ICD-401.9) Assessment: Unchanged  Her updated medication list for this problem includes:    Amlodipine Besylate 10 Mg Tabs (Amlodipine besylate) .Marland Kitchen... Take 1 tablet by mouth once a day    Losartan Potassium-hctz 100-12.5 Mg Tabs (Losartan potassium-hctz) .Marland Kitchen... Take 1 tablet by mouth once a day  Orders: T-Basic Metabolic Panel [phone])  BP today: 122/82 Prior BP: 120/84 (06/19/2009)  Prior 10 Yr Risk Heart Disease: 24 % (03/04/2009)  Labs Reviewed: K+: 4.3 (06/19/2009) Creat: : 0.75 (06/19/2009)   Chol: 106 (06/19/2009)   HDL: 45 (06/19/2009)   LDL: 50 (06/19/2009)   TG: 57 (06/19/2009)  Problem # 3:  COUGH (ICD-786.2) Assessment: Unchanged pt to sched pulmonary f/u  Problem # 4:  DIABETES (ICD-250.00) Assessment: Improved  Her updated medication list for this problem includes:    Aspirin 81 Mg Tbec (Aspirin) ..... One tab by mouth once daily    Metformin Hcl 1000 Mg Tabs (Metformin hcl) ..... One tab by mouth two times a day    Losartan Potassium-hctz 100-12.5 Mg Tabs (Losartan potassium-hctz) .Marland Kitchen... Take 1 tablet by mouth once a day    Glipizide 5 Mg Tabs (Glipizide) ..... One and a half tablets twice daily  Orders: T- Hemoglobin A1C ([id number])  Labs Reviewed: Creat: 0.75 (06/19/2009)     Last Eye Exam: normal (09/16/2008) Reviewed HgBA1c results: 7.9 (06/19/2009)  9.3 (03/26/2009)  Complete Medication List: 1)  Aspirin 81 Mg Tbec (Aspirin) .... One tab by mouth once daily 2)  Proair Hfa 108 (90 Base) Mcg/act Aers (Albuterol sulfate) .... One puff two times a day as needed 3)  Metformin Hcl 1000 Mg Tabs (Metformin hcl) .... One tab by mouth two times a day 4)  Amlodipine Besylate 10 Mg Tabs (Amlodipine besylate) .... Take 1 tablet by  mouth once a day 5)  Simvastatin 40 Mg Tabs (Simvastatin) .... One tab by mouth qhs 6)  Vitamin D 400 Unit Tabs (Cholecalciferol) .... Take 1 tab daily 7)  Ipratropium-albuterol 0.5-2.5 (3) Mg/65ml Soln (Ipratropium-albuterol) .... One vial three times a day as needed for 1 month 8)  Prilosec 20 Mg Cpdr (Omeprazole) .... Take 1 tab two times a day 9)  Losartan Potassium-hctz 100-12.5 Mg Tabs (Losartan potassium-hctz) .... Take 1 tablet by mouth once a day 10)  Glipizide 5 Mg Tabs (Glipizide) .... One and a half tablets twice daily 11)  Zolpidem Tartrate 10 Mg Tabs (Zolpidem tartrate) .... Take 1 tab by mouth at bedtime  Other Orders: T-TSH ([id number]) T-CBC w/Diff [phone]) T-Vitamin D (25-Hydroxy) [id number])  Patient Instructions: 1)  f/u THE WEEK  of July 22 or after 2)  It is important that you exercise regularly at least 20 minutes 5 times a  week. If you develop chest pain, have severe difficulty breathing, or feel very tired , stop exercising immediately and seek medical attention. 3)  You need to lose weight. Consider a lower calorie diet and regular exercise.  4)  TSH prior to visit, ICD-9: 5)  CBC w/ Diff prior to visit, ICD-9: july 15 or after. 6)  HbgA1C prior to visit, ICD-9: 7)  Vit D, chem 7 8)  You need to call Dr Juanetta Gosling or go to his office to make your f/u appt 9)  Test once daily but alternate between fasting and bedtime. 10)  Practice good sleep hygiene

## 2010-04-06 NOTE — Letter (Signed)
Summary: home bloodpressure readings  home bloodpressure readings   Imported By: Dreama Saa, CNA 06/15/2009 11:53:53  _____________________________________________________________________  External Attachment:    Type:   Image     Comment:   External Document

## 2010-04-06 NOTE — Miscellaneous (Signed)
Summary: LABS BMP,LIPID,LIVER,A1C 03/26/2009  Clinical Lists Changes  Observations: Added new observation of CALCIUM: 10.3 mg/dL (30/86/5784 6:96) Added new observation of ALBUMIN: 4.4 g/dL (29/52/8413 2:44) Added new observation of PROTEIN, TOT: 7.4 g/dL (03/09/7251 6:64) Added new observation of SGPT (ALT): 24 units/L (03/26/2009 9:10) Added new observation of SGOT (AST): 18 units/L (03/26/2009 9:10) Added new observation of ALK PHOS: 92 units/L (03/26/2009 9:10) Added new observation of CREATININE: 0.82 mg/dL (40/34/7425 9:56) Added new observation of BUN: 14 mg/dL (38/75/6433 2:95) Added new observation of BG RANDOM: 137 mg/dL (18/84/1660 6:30) Added new observation of CO2 PLSM/SER: 29 meq/L (03/26/2009 9:10) Added new observation of CL SERUM: 101 meq/L (03/26/2009 9:10) Added new observation of K SERUM: 4.6 meq/L (03/26/2009 9:10) Added new observation of NA: 143 meq/L (03/26/2009 9:10) Added new observation of LDL: 145 mg/dL (16/03/930 3:55) Added new observation of HDL: 52 mg/dL (73/22/0254 2:70) Added new observation of TRIGLYC TOT: 131 mg/dL (62/37/6283 1:51) Added new observation of CHOLESTEROL: 223 mg/dL (76/16/0737 1:06) Added new observation of HGBA1C: 9.3 % (03/26/2009 9:10)

## 2010-04-06 NOTE — Letter (Signed)
Summary: Appointment - Missed  [facility] [place]  [address], [place] [address]   Phone: [phone]  Fax: [phone]     May 11, 2009 MRN: [id number]   [facility] [address] [place], [place]  [address]   Dear Ms. Grosso,  Our records indicate you missed your appointment on 05-08-09 woth Dr Dietrich Pates                                 It is very important that we reach you to reschedule this appointment. We look forward to participating in your health care needs. Please contact us at the number listed above at your earliest convenience to reschedule this appointment.     Sincerely,    Glass blower/designer

## 2010-04-06 NOTE — Letter (Signed)
Summary: Discharge Summary 06-03-1988  Discharge Summary 06-03-1988   Imported By: Faythe Ghee 05/13/2009 14:11:14  _____________________________________________________________________  External Attachment:    Type:   Image     Comment:   External Document

## 2010-04-06 NOTE — Letter (Signed)
Summary: medical release  medical release   Imported By: Lind Guest 03/11/2009 11:06:37  _____________________________________________________________________  External Attachment:    Type:   Image     Comment:   External Document

## 2010-04-06 NOTE — Progress Notes (Signed)
Summary: SUPPLIES  Phone Note Call from Patient   Summary of Call: NEED THE SUPPLIES FOR HER METER SEND TO [facility] IT WAS SUPPOSED BEEN DONE . Initial call taken by: Lind Guest,  December 22, 2009 11:34 AM  Follow-up for Phone Call        Rx Called In Follow-up by: Adella Hare LPN,  December 22, 2009 2:19 PM    New/Updated Medications: ACCU-CHEK COMPACT TEST DRUM  STRP (GLUCOSE BLOOD) once daily testing dx:250.00 ACCU-CHEK MULTICLIX LANCETS  MISC (LANCETS) once daily testing dx:250.00 Prescriptions: ACCU-CHEK MULTICLIX LANCETS  MISC (LANCETS) once daily testing dx:250.00  #100 x 1   Entered by:   Adella Hare LPN   Authorized by:   Syliva Overman MD   Signed by:   Adella Hare LPN on 62/13/0865   Method used:   Electronically to        [facility] (retail)       [address] [place], [place]  [address]       Ph: [phone]       Fax: [phone]   RxID:   [id number] ACCU-CHEK COMPACT TEST DRUM  STRP (GLUCOSE BLOOD) once daily testing dx:250.00  #100 x 1   Entered by:   Adella Hare LPN   Authorized by:   Syliva Overman MD   Signed by:   Adella Hare LPN on 75/64/3329   Method used:   Electronically to        [facility] (retail)       [address] [place], [place]  [address]       Ph: [phone]       Fax: [phone]   RxID:   [id number]

## 2010-04-06 NOTE — Assessment & Plan Note (Signed)
Summary: per Dr.Simpson for increased fatigue/tg   Visit Type:  Initial Consult Primary Provider:  Syliva Overman MD   History of Present Illness: Initial visit for this very pleasant 65 year old woman kindly referred by Dr. Lodema Hong for evaluation of exercise intolerance and exertional dyspnea in the setting of multiple cardiovascular risk factors.  Tiffany Jacobs has a history of hypertension that has been well controlled with medical therapy.  She was seen by a cardiologist many years ago, but has little information about the workup performed or the findings.  She was initially told that she had suffered a myocardial infarction, but apparently did not have atherosclerotic coronary artery disease at catheterization.  She was evaluated in recent years by Dr. Domingo Sep, who told her that the initial diagnosis was erroneous.  We are currently seeking all of her past medical records.  She has had diabetes for the past few years treated with an oral agent and has had long-standing obesity.  She has noted the insidious onset of progressive dyspnea on exertion.  She experiences rare episodes of atypical chest discomfort, which she does not considered a significant problem.  She reports daytime fatigue, but not really somnolence.  There is no history of nocturnal snoring nor of evaluation for possible sleep apnea.  EKG  Procedure date:  April 24, 2009  Findings:      Sinus tachycardia at a rate of 101 bpm Left anterior fascicular block Slightly delayed R-wave progression Nonspecific T wave abnormality Possible left ventricular hypertrophy No previous tracings for comparison.   Current Medications (verified): 1)  Aspirin 81 Mg  Tbec (Aspirin) .... One Tab By Mouth Once Daily 2)  Proair Hfa 108 (90 Base) Mcg/act  Aers (Albuterol Sulfate) .... One Puff Two Times A Day As Needed 3)  Metformin Hcl 1000 Mg  Tabs (Metformin Hcl) .... One Tab By Mouth Two Times A Day 4)  Amlodipine Besylate 10 Mg Tabs  (Amlodipine Besylate) .... Take 1 Tablet By Mouth Once A Day 5)  Simvastatin 40 Mg Tabs (Simvastatin) .... One Tab By Mouth Qhs 6)  Glipizide 5 Mg Tabs (Glipizide) .... Take  1 Tab Two Times A Day 7)  Vitamin B Plus+ 8-6-725 Mg-Mcg-Mg Caps (Vit B6-Vit B12-Omega 3 Acids) .... Take 1 Tab Daily 8)  Vitamin D 400 Unit Tabs (Cholecalciferol) .... Take 1 Tab Daily  Allergies (verified): No Known Drug Allergies  Past History:  Family History: Last updated: 2009-04-24 Mother deceased at an advanced age; uncertain cause of death Father deceased kidney failure Sister 3 healthy  Brothers 3 DM1 Mental disease HTN,2  Social History: Last updated: 03/04/2009 Disabled Married  x 49 years 7 children Former Smoker, quit 1990 Alcohol use-no Drug use-no  Past Medical History: Hospitalized and evaluated for possible cardiac disease in 1992 including cardiac catheterization Hypertension  Diabetes-type II SCIATICA (ICD-724.3) LOW BACK PAIN (ICD-724.2) OBESITY HYPOTHYROID SEASONAL ALLERGIES ASTHMA/RAD  Past Surgical History: Total Knee Arthroplasty left Dr. Romeo Apple 02-01-05 Tubal ligation (1970) Left breast biopsy for benign disease  Family History: Mother deceased at an advanced age; uncertain cause of death Father deceased kidney failure Sister 3 healthy  Brothers 3 DM1 Mental disease HTN,2  Review of Systems       impaired hearing in the left ear; partial upper and lower dentures; occasional mild palpitations; stable weight and appetite; remote history of peptic ulcer disease; continuing urinary frequency; diffuse arthritic discomfort; intermittent ankle edema.  All other systems reviewed and are negative.  Vital Signs:  Patient profile:   65 year  old female Weight:      223 pounds BMI:     40.28 Pulse rate:   106 / minute BP sitting:   129 / 73  (right arm)  Vitals Entered By: Dreama Saa, CNA (April 10, 2009 1:35 PM)  Physical Exam  General:    General-Well-developed; no acute distress; obese HEENT-[place]/AT; PERRL; EOM intact; conjunctiva and lids nl:  Neck-No JVD; no carotid bruits: Endocrine-Mild thyromegaly: Lungs-No tachypnea, clear without rales, rhonchi or wheezes: CV-normal PMI; normal S1 and S2:;  Abdomen-BS normal; soft and non-tender without masses or organomegaly: MS-No deformities, cyanosis or clubbing: Neurologic-Nl cranial nerves; symmetric strength and tone: Skin- Warm, no sig. lesions: Extremities-Nl distal pulses; no edema    Impression & Recommendations:  Problem # 1:  ? of ATHEROSCLEROTIC CARDIOVASCULAR DISEASE (ICD-429.2) There is inadequate historical information to allow a diagnosis to be established.  Her history sounds as if it could be consistent with stress cardiomyopathy or with coronary artery spasm.  My hope is that prior medical records will provide additional guidance.  Even with this uncertain history,  with multiple cardiovascular risk factors plus exertional symptoms, stress testing is warranted and will be undertaken pending the receipt of additional medical information.  Problem # 2:  ESSENTIAL HYPERTENSION (ICD-401.9) Blood pressure control is excellent with current medications.  Problem # 3:  DYSPNEA (ICD-786.05) Dyspnea is likely related to physical deconditioning and obesity.  Ischemic heart disease will be excluded with stress testing.  Additional studies can be undertaken as warranted.  Problem # 4:  DIABETES (ICD-250.00) CBGs are generally good at home. Recent hemoglobin A1c level was in excess of 9 suggesting that more intensive medical therapy will be required.  Recent lipid profile was also suboptimal.  Simvastatin has been started, and a repeat lipid profile will be necessary in a month or so.  I plan to reevaluate this nice woman once her stress test has been completed and additional medical information is available.  Other Orders: Stress Echo (Stress Echo)  Patient  Instructions: 1)  Your physician recommends that you schedule a follow-up appointment in: 2 weeks 2)  Your physician has requested that you have a stress echocardiogram. For further information please visit [email or web address]  Please follow instruction sheet as given.

## 2010-04-06 NOTE — Letter (Signed)
Summary: Letter  Letter   Imported By: Lind Guest 04/01/2009 15:05:30  _____________________________________________________________________  External Attachment:    Type:   Image     Comment:   External Document

## 2010-04-06 NOTE — Letter (Signed)
Summary: [facility] [employer]  [address] [place] [address]   Phone: [phone]  Fax: [phone]     May 12, 2009 MRN: [id number]   [facility] [address] [place], [place]  [address]      YOUR LAB WORK IS DUE  June 02, 2009 _________________________________________  Please go to [facility], located across the street from [facility] on the second floor.  Hours are Monday - Friday 7am until 7:30pm         Saturday 8am until 12noon    __  DO NOT EAT OR DRINK AFTER MIDNIGHT EVENING PRIOR TO LABWORK  _X_ YOUR LABWORK IS NOT FASTING --YOU MAY EAT PRIOR TO LABWORK

## 2010-04-06 NOTE — Progress Notes (Signed)
Summary: nos appt  Phone Note Call from Patient   Caller: [email or web address]  Call For: byrum Summary of Call: In ref to nos from 8/16, phone disconnected, notified Dr. Anthony Sar office of status, no other contact info available. Initial call taken by: Darletta Moll,  October 21, 2009 9:42 AM

## 2010-04-06 NOTE — Op Note (Signed)
Summary: Operative Report  Operative Report   Imported By: Lind Guest 03/09/2009 09:49:52  _____________________________________________________________________  External Attachment:    Type:   Image     Comment:   External Document

## 2010-04-06 NOTE — Letter (Signed)
Summary: DIABETIC SHOES  DIABETIC SHOES   Imported By: Lind Guest 08/05/2009 15:33:37  _____________________________________________________________________  External Attachment:    Type:   Image     Comment:   External Document

## 2010-04-06 NOTE — Progress Notes (Signed)
Summary: DR. Juanetta Gosling  DR. HAWKINS   Imported By: Lind Guest 05/01/2009 10:33:52  _____________________________________________________________________  External Attachment:    Type:   Image     Comment:   External Document

## 2010-04-06 NOTE — Progress Notes (Signed)
Summary: RX  Phone Note Call from Patient   Summary of Call: [facility] IS NOT GETTING THE RX YOU SENT OVER PLEASE TRY AGAIN [facility] LEFT MESSAGE Initial call taken by: Lind Guest,  December 17, 2009 10:58 AM  Follow-up for Phone Call        resent again Follow-up by: Everitt Amber LPN,  December 17, 2009 12:07 PM    Prescriptions: LANCETS AND STRIPS FOR ACCU-CHEK PLUS METER two times a day testing  #17month x 3   Entered by:   Everitt Amber LPN   Authorized by:   Syliva Overman MD   Signed by:   Everitt Amber LPN on 74/25/9563   Method used:   Printed then faxed to ...       [facility] (retail)       [address]       [place], [place]  [address]       Ph: [phone]       Fax: [phone]   RxID:   [id number]

## 2010-04-06 NOTE — Miscellaneous (Signed)
Summary: refill  Clinical Lists Changes  Medications: Rx of AMLODIPINE BESYLATE 10 MG TABS (AMLODIPINE BESYLATE) Take 1 tablet by mouth once a day;  #30 Each x 0;  Signed;  Entered by: Worthy Keeler LPN;  Authorized by: Syliva Overman MD;  Method used: Electronically to [facility] [address] [place], [place], [place]  [address], Ph: [phone], Fax: [phone]    Prescriptions: AMLODIPINE BESYLATE 10 MG TABS (AMLODIPINE BESYLATE) Take 1 tablet by mouth once a day  #30 Each x 0   Entered by:   Worthy Keeler LPN   Authorized by:   Syliva Overman MD   Signed by:   Worthy Keeler LPN on 13/10/6576   Method used:   Electronically to        [facility] (retail)       [address]       [place], [place]  [address]       Ph: [phone]       Fax: [phone]   RxID:   [id number]

## 2010-04-06 NOTE — Letter (Signed)
Summary: [facility]   Imported By: Lind Guest 06/22/2009 10:56:46  _____________________________________________________________________  External Attachment:    Type:   Image     Comment:   External Document

## 2010-04-06 NOTE — Progress Notes (Signed)
  Phone Note Other Incoming   Caller: Dr Lodema Hong Summary of Call: pls let pt know i received labs from her previous provider, most recent was in october, she needs a fasting lipid, hepatic , chem 7 HBA1C asap, pls order test also Initial call taken by: Syliva Overman MD,  March 22, 2009 2:37 PM  Follow-up for Phone Call        called patient, left message Follow-up by: Worthy Keeler LPN,  March 23, 2009 10:27 AM  Additional Follow-up for Phone Call Additional follow up Details #1::        labs ordered, patient aware Additional Follow-up by: Worthy Keeler LPN,  March 23, 2009 4:08 PM

## 2010-04-06 NOTE — Progress Notes (Signed)
  Phone Note Other Incoming   Caller: dr simpson Summary of Call: pls advise pt her blood sugar is uncontrolled based on lab data, sh needs top add glipizide 5 mg  one twice daily, attend diabetic class at [facility] , change her diet and commit to at least of physical activity 5 days per week.it is vital that she also test regularly to improve control. Needs to come in for oV with values recorded and her meter in approx 6 weeks, if she has no ov needs to sched.  Pls erx the script in, NOTE it is regular glipizde not the xl which is often not covered  Initial call taken by: Syliva Overman MD,  April 03, 2009 4:53 AM  Follow-up for Phone Call        called patient, no answer Follow-up by: Worthy Keeler LPN,  April 03, 2009 9:56 AM  Additional Follow-up for Phone Call Additional follow up Details #1::        rx sent, patient aware Additional Follow-up by: Worthy Keeler LPN,  April 06, 2009 8:41 AM    New/Updated Medications: GLUCOTROL 5 MG TABS (GLIPIZIDE) one tab by mouth bid Prescriptions: GLUCOTROL 5 MG TABS (GLIPIZIDE) one tab by mouth bid  #60 x 2   Entered by:   Worthy Keeler LPN   Authorized by:   Syliva Overman MD   Signed by:   Worthy Keeler LPN on 52/84/1324   Method used:   Electronically to        [facility] (retail)       [address] [place], [place]  [address]       Ph: [phone]       Fax: [phone]   RxID:   [id number]

## 2010-04-06 NOTE — Letter (Signed)
Summary: LANCETS AND STRIPS  LANCETS AND STRIPS   Imported By: Lind Guest 01/04/2010 13:34:18  _____________________________________________________________________  External Attachment:    Type:   Image     Comment:   External Document

## 2010-04-06 NOTE — Progress Notes (Signed)
Summary: refill  Phone Note Call from Patient   Summary of Call: needs to get needles and drums for sugar meter. [phone] [facility]  Initial call taken by: Rudene Anda,  December 08, 2009 4:30 PM  Follow-up for Phone Call        THIS WAS SENT IN SEPT 23,PLSAQSK HER TO RECHECK WITHT THEPHARMACY , OR IF EASIER JUST RESEND, AND LET HER KNOW Follow-up by: Syliva Overman MD,  December 08, 2009 7:12 PM  Additional Follow-up for Phone Call Additional follow up Details #1::        resent to pharmacy Additional Follow-up by: Everitt Amber LPN,  December 09, 2009 9:19 AM    Prescriptions: LANCETS AND STRIPS FOR ACCU-CHEK PLUS METER two times a day testing  #32month x 3   Entered by:   Everitt Amber LPN   Authorized by:   Syliva Overman MD   Signed by:   Everitt Amber LPN on 45/40/9811   Method used:   Printed then faxed to ...       [facility] (retail)       [address]       [place], [place]  [address]       Ph: [phone]       Fax: [phone]   RxID:   [id number]

## 2010-04-06 NOTE — Progress Notes (Signed)
  note written and sent to nurse re labs needed

## 2010-04-06 NOTE — Miscellaneous (Signed)
Summary: Meds  Clinical Lists Changes  Medications: Added new medication of * LANCETS AND STRIPS FOR ACCU-CHEK PLUS METER two times a day testing - Signed Added new medication of IPRATROPIUM-ALBUTEROL 0.5-2.5 (3) MG/3ML SOLN (IPRATROPIUM-ALBUTEROL) one inhalation every 6-8 hrs as needed for wheezing - Signed Rx of LANCETS AND STRIPS FOR ACCU-CHEK PLUS METER two times a day testing;  #3 months x 3;  Signed;  Entered by: Everitt Amber LPN;  Authorized by: Syliva Overman MD;  Method used: Handwritten Rx of IPRATROPIUM-ALBUTEROL 0.5-2.5 (3) MG/3ML SOLN (IPRATROPIUM-ALBUTEROL) one inhalation every 6-8 hrs as needed for wheezing;  #1 month x 3;  Signed;  Entered by: Everitt Amber LPN;  Authorized by: Syliva Overman MD;  Method used: Handwritten    Prescriptions: IPRATROPIUM-ALBUTEROL 0.5-2.5 (3) MG/3ML SOLN (IPRATROPIUM-ALBUTEROL) one inhalation every 6-8 hrs as needed for wheezing  #1 month x 3   Entered by:   Everitt Amber LPN   Authorized by:   Syliva Overman MD   Signed by:   Everitt Amber LPN on 62/69/4854   Method used:   Handwritten   RxID:   [id number] LANCETS AND STRIPS FOR ACCU-CHEK PLUS METER two times a day testing  #3 months x 3   Entered by:   Everitt Amber LPN   Authorized by:   Syliva Overman MD   Signed by:   Everitt Amber LPN on 37/16/9678   Method used:   Handwritten   RxID:   [id number]

## 2010-04-06 NOTE — Assessment & Plan Note (Signed)
Summary: PHY   Vital Signs:  Patient profile:   65 year old female Menstrual status:  postmenopausal Height:      62.5 inches Weight:      228.25 pounds BMI:     41.23 O2 Sat:      95 % Pulse rate:   96 / minute Pulse rhythm:   regular Resp:     14 per minute BP sitting:   120 / 84  (right arm)  Vitals Entered By: Everitt Amber LPN (June 19, 2009 9:45 AM)  Nutrition Counseling: Patient's BMI is greater than 25 and therefore counseled on weight management options. CC: CPE, having pain in her left hand and wrist, wanted to know about a muscle relaxer or something she could take for it  Vision Screening:Left eye w/o correction: 20 / 40 Right Eye w/o correction: 20 / 25 Both eyes w/o correction:  20/ 30  Color vision testing: normal      Vision Entered By: Everitt Amber LPN (June 19, 2009 9:45 AM)     Menstrual Status postmenopausal Last PAP Result Normal   Primary Care Provider:  Syliva Overman MD  CC:  CPE, having pain in her left hand and wrist, and wanted to know about a muscle relaxer or something she could take for it.  History of Present Illness: The pt reports that she continues to have cpough and exertional fatiguie, adn that though the med for reflux prescribed by pulmonary has helped, she would like a f/u there because of symptom persistence. Shr reports significant fluctuation in her blood sugars, and reports frustration art her inability to lose weight, despite attempts ast lifestyle change to facilitate this. She las beento cardiology since her last visit and been cleared.  Current Medications (verified): 1)  Aspirin 81 Mg  Tbec (Aspirin) .... One Tab By Mouth Once Daily 2)  Proair Hfa 108 (90 Base) Mcg/act  Aers (Albuterol Sulfate) .... One Puff Two Times A Day As Needed 3)  Metformin Hcl 1000 Mg  Tabs (Metformin Hcl) .... One Tab By Mouth Two Times A Day 4)  Amlodipine Besylate 10 Mg Tabs (Amlodipine Besylate) .... Take 1 Tablet By Mouth Once A Day 5)   Simvastatin 40 Mg Tabs (Simvastatin) .... One Tab By Mouth Qhs 6)  Glipizide 5 Mg Tabs (Glipizide) .... Take  1 Tab Two Times A Day 7)  Vitamin B Plus+ 8-6-725 Mg-Mcg-Mg Caps (Vit B6-Vit B12-Omega 3 Acids) .... Take 1 Tab Daily 8)  Vitamin D 400 Unit Tabs (Cholecalciferol) .... Take 1 Tab Daily 9)  Ipratropium-Albuterol 0.5-2.5 (3) Mg/90ml Soln (Ipratropium-Albuterol) .... One Vial Three Times A Day As Needed For 1 Month 10)  Ambien 10 Mg Tabs (Zolpidem Tartrate) .... Take 1 Tab Qod 11)  Prilosec 20 Mg Cpdr (Omeprazole) .... Take 1 Tab Two Times A Day 12)  Losartan Potassium-Hctz 100-12.5 Mg Tabs (Losartan Potassium-Hctz) .... Take 1 Tablet By Mouth Once A Day 13)  Fish Oil 1000 Mg Caps (Omega-3 Fatty Acids) .... Take 1 Cap Two Times A Day  Allergies (verified): No Known Drug Allergies  Review of Systems      See HPI General:  Complains of fatigue; denies chills and fever. Eyes:  Denies discharge and red eye. ENT:  Denies hoarseness, nasal congestion, sinus pressure, and sore throat. CV:  Complains of shortness of breath with exertion; denies chest pain or discomfort, palpitations, and swelling of feet; had a redcent cardiac eval, no new dx of cAD. Resp:  Complains of cough, shortness  of breath, and wheezing; denies sputum productive; has been eval by pulmonary and has seen improvement on reflux med, wants a f/u scheduled. GI:  Denies abdominal pain, constipation, diarrhea, nausea, and vomiting. GU:  Denies dysuria and urinary frequency. MS:  Complains of joint pain, low back pain, mid back pain, muscle weakness, and stiffness; c/o hand pain and weakness. Derm:  Denies itching, lesion(s), and rash. Neuro:  Complains of headaches; denies poor balance, seizures, and sensation of room spinning; occasional. Psych:  Complains of anxiety; denies depression, panic attacks, sense of great danger, suicidal thoughts/plans, and thoughts of violence. Endo:  Denies cold intolerance, excessive thirst, and  excessive urination; tests at least once daily, re[ports sugars are often between 160 to 200. Heme:  Denies abnormal bruising and bleeding. Allergy:  Complains of seasonal allergies.  Physical Exam  General:  Well-developed,obese,in no acute distress; alert,appropriate and cooperative throughout examination Head:  Normocephalic and atraumatic without obvious abnormalities. No apparent alopecia or balding. Eyes:  No corneal or conjunctival inflammation noted. EOMI. Perrla. Funduscopic exam benign, without hemorrhages, exudates or papilledema. Vision grossly normal. Ears:  External ear exam shows no significant lesions or deformities.  Otoscopic examination reveals clear canals, tympanic membranes are intact bilaterally without bulging, retraction, inflammation or discharge. Hearing is grossly normal bilaterally. Nose:  External nasal examination shows no deformity or inflammation. Nasal mucosa are pink and moist without lesions or exudates. Mouth:  pharynx pink and moist, fair dentition, and teeth missing.   Neck:  No deformities, masses, or tenderness noted. Chest Wall:  No deformities, masses, or tenderness noted. Breasts:  No mass, nodules, thickening, tenderness, bulging, retraction, inflamation, nipple discharge or skin changes noted.   Lungs:  Normal respiratory effort, chest expands symmetrically. Lungs are clear to auscultation, no crackles or wheezes. Heart:  Normal rate and regular rhythm. S1 and S2 normal without gallop, murmur, click, rub or other extra sounds. Abdomen:  Bowel sounds positive,abdomen soft and non-tender without masses, organomegaly or hernias noted. Rectal:  No external abnormalities noted. Normal sphincter tone. No rectal masses or tenderness.Guaic neg stool Genitalia:  normal introitus, normal uterus size and position, and no adnexal masses or tenderness.   Msk:  No deformity or scoliosis noted of thoracic or lumbar spine.   Pulses:  R and L  carotid,radial,femoral,dorsalis pedis and posterior tibial pulses are full and equal bilaterally Extremities:  decreased rOLM spine and hips and shoulders and knees Neurologic:  No cranial nerve deficits noted. Station and gait are normal. Plantar reflexes are down-going bilaterally. DTRs are symmetrical throughout. Sensory, motor and coordinative functions appear intact. Skin:  Intact without suspicious lesions or rashes Cervical Nodes:  No lymphadenopathy noted Axillary Nodes:  No palpable lymphadenopathy Inguinal Nodes:  No significant adenopathy Psych:  Cognition and judgment appear intact. Alert and cooperative with normal attention span and concentration. No apparent delusions, illusions, hallucinations  Diabetes Management Exam:    Foot Exam (with socks and/or shoes not present):       Sensory-Monofilament:          Left foot: diminished          Right foot: diminished       Inspection:          Left foot: normal          Right foot: normal       Nails:          Left foot: normal          Right foot: normal  Impression & Recommendations:  Problem # 1:  ESSENTIAL HYPERTENSION (ICD-401.9) Assessment Unchanged  Her updated medication list for this problem includes:    Amlodipine Besylate 10 Mg Tabs (Amlodipine besylate) .Marland Kitchen... Take 1 tablet by mouth once a day    Losartan Potassium-hctz 100-12.5 Mg Tabs (Losartan potassium-hctz) .Marland Kitchen... Take 1 tablet by mouth once a day  Orders: T-Basic Metabolic Panel [id number])  BP today: 120/84 Prior BP: 123/66 (06/12/2009)  Prior 10 Yr Risk Heart Disease: 24 % (03/04/2009)  Labs Reviewed: K+: 4.8 (06/02/2009) Creat: : 0.71 (06/02/2009)   Chol: 223 (03/26/2009)   HDL: 52 (03/26/2009)   LDL: 145 (03/26/2009)   TG: 131 (03/26/2009)  Problem # 2:  DIABETES (ICD-250.00) Assessment: Comment Only  The following medications were removed from the medication list:    Glipizide 5 Mg Tabs (Glipizide) .Marland Kitchen... Take  1 tab two times a day Her  updated medication list for this problem includes:    Aspirin 81 Mg Tbec (Aspirin) ..... One tab by mouth once daily    Metformin Hcl 1000 Mg Tabs (Metformin hcl) ..... One tab by mouth two times a day    Losartan Potassium-hctz 100-12.5 Mg Tabs (Losartan potassium-hctz) .Marland Kitchen... Take 1 tablet by mouth once a day    Glipizide 5 Mg Tabs (Glipizide) ..... One and a half tablets twice daily  Orders: T- Hemoglobin A1C ([id number]) T-Urine Microalbumin w/creat. ratio [phone])  Labs Reviewed: Creat: 0.71 (06/02/2009)     Last Eye Exam: normal (09/16/2008) Reviewed HgBA1c results: 9.3 (03/26/2009)  9.3 (03/26/2009)  Problem # 3:  LOW BACK PAIN (ICD-724.2) Assessment: Unchanged  Her updated medication list for this problem includes:    Aspirin 81 Mg Tbec (Aspirin) ..... One tab by mouth once daily  Problem # 4:  DYSPNEA (ICD-786.05) Assessment: Unchanged new appt to be dsched with pulmonary  Problem # 5:  OBESITY (ICD-278.00) Assessment: Unchanged  Ht: 62.5 (06/19/2009)   Wt: 228.25 (06/19/2009)   BMI: 41.23 (06/19/2009)  Problem # 6:  GERD (ICD-530.81) Assessment: Improved  Her updated medication list for this problem includes:    Prilosec 20 Mg Cpdr (Omeprazole) .Marland Kitchen... Take 1 tab two times a day  Complete Medication List: 1)  Aspirin 81 Mg Tbec (Aspirin) .... One tab by mouth once daily 2)  Proair Hfa 108 (90 Base) Mcg/act Aers (Albuterol sulfate) .... One puff two times a day as needed 3)  Metformin Hcl 1000 Mg Tabs (Metformin hcl) .... One tab by mouth two times a day 4)  Amlodipine Besylate 10 Mg Tabs (Amlodipine besylate) .... Take 1 tablet by mouth once a day 5)  Simvastatin 40 Mg Tabs (Simvastatin) .... One tab by mouth qhs 6)  Vitamin B Plus+ 8-6-725 Mg-mcg-mg Caps (Vit b6-vit b12-omega 3 acids) .... Take 1 tab daily 7)  Vitamin D 400 Unit Tabs (Cholecalciferol) .... Take 1 tab daily 8)  Ipratropium-albuterol 0.5-2.5 (3) Mg/66ml Soln (Ipratropium-albuterol) ....  One vial three times a day as needed for 1 month 9)  Ambien 10 Mg Tabs (Zolpidem tartrate) .... Take 1 tab qod 10)  Prilosec 20 Mg Cpdr (Omeprazole) .... Take 1 tab two times a day 11)  Losartan Potassium-hctz 100-12.5 Mg Tabs (Losartan potassium-hctz) .... Take 1 tablet by mouth once a day 12)  Fish Oil 1000 Mg Caps (Omega-3 fatty acids) .... Take 1 cap two times a day 13)  Glipizide 5 Mg Tabs (Glipizide) .... One and a half tablets twice daily  Other Orders: T-Hepatic Function [id number]) T-Lipid Profile [phone]) Pap Smear (  [address]) Hemoccult Guaiac-1 spec.(in office) ([address])  Patient Instructions: 1)  F/U in 6 weeks. 2)  It is important that you exercise regularly at least 20 minutes 5 times a week. If you develop chest pain, have severe difficulty breathing, or feel very tired , stop exercising immediately and seek medical attention. 3)  You need to lose weight. Consider a lower calorie diet and regular exercise.  4)  BMP prior to visit, ICD-9: 5)  Hepatic Panel prior to visit, ICD-9:  today 6)  Lipid Panel prior to visit, ICD-9: 7)  HbgA1C prior to visit, ICD-9: 8)  Urine Microalbumin prior to visit, ICD-9: 9)  Ensure you test once every day, write down and bring to oV , pls call if you have concerns Prescriptions: GLIPIZIDE 5 MG TABS (GLIPIZIDE) one and a half tablets twice daily  #90 x 5   Entered and Authorized by:   Syliva Overman MD   Signed by:   Syliva Overman MD on 06/21/2009   Method used:   Electronically to        [facility] (retail)       [address]       [place], [place]  [address]       Ph: [phone]       Fax: [phone]   RxID:   [id number]    Laboratory Results    Stool - Occult Blood Date: 06/19/2009 Comments: [id number] 9r 8/11 118 10 12

## 2010-04-06 NOTE — Assessment & Plan Note (Signed)
Summary: office visit   Vital Signs:  Patient profile:   65 year old female Menstrual status:  postmenopausal Height:      62.5 inches Weight:      230.25 pounds BMI:     41.59 O2 Sat:      94 % Pulse rate:   110 / minute Pulse rhythm:   regular Resp:     16 per minute BP sitting:   130 / 80  (left arm) Cuff size:   large  Vitals Entered By: Everitt Amber LPN (October 09, 2009 9:17 AM)  Nutrition Counseling: Patient's BMI is greater than 25 and therefore counseled on weight management options. CC: has a dry cough for awhile now, won't go away. Wants to see ENT Comments didn't bring meds   Primary Care Provider:  Syliva Overman MD  CC:  has a dry cough for awhile now and won't go away. Wants to see ENT.  History of Present Illness: Pt reports that she  coughs iconstantly, and denies fever , chills , or sputum. Has a tickle in the throat. Statews she ius unable to sing as she had in the past, wants tyo see a new pulmonologist, states no better than she has been , also wants ENT eval. She is distraught byu her stymptoms, states her PFT tests were denied, Dr. Juanetta Gosling was to contact her to have them done , but soi far, no word.She states she already has an establishe d dx of astrhma. She reports that she has not experienced symtoms of uncointrolled blood sugars, she unfortunately has not been testing much recently, lacks supplies.  Allergies (verified): No Known Drug Allergies  Review of Systems      See HPI General:  Complains of fatigue, sleep disorder, and weakness. Eyes:  Denies blurring and discharge. ENT:  Complains of hoarseness and sore throat; denies postnasal drainage and sinus pressure. CV:  Denies chest pain or discomfort, palpitations, and swelling of feet. Resp:  Complains of cough, shortness of breath, and wheezing; denies sputum productive. GI:  Denies abdominal pain, constipation, diarrhea, nausea, and vomiting. GU:  Denies dysuria and urinary frequency. MS:   Complains of joint pain and stiffness. Derm:  Denies itching and rash. Neuro:  Denies headaches and seizures. Psych:  Complains of anxiety and depression; denies mental problems, panic attacks, suicidal thoughts/plans, thoughts of violence, and unusual visions or sounds. Endo:  Denies excessive thirst and excessive urination. Heme:  Denies abnormal bruising and enlarge lymph nodes. Allergy:  Complains of seasonal allergies.  Physical Exam  General:  Well-developed,obese,in no acute distress; alert,appropriate and cooperative throughout examination HEENT: No facial asymmetry,  EOMI, No sinus tenderness, TM's Clear, oropharynx  pink and moist.   Chest: Clear to auscultation bilaterally.  CVS: S1, S2, No murmurs, No S3.   Abd: Soft, Nontender.  RU:[id number]  ROM spine, hips, shoulders and knees.  Ext: No edema.   CNS: CN 2-12 intact, power tone and sensation normal throughout.   Skin: Intact, no visible lesions or rashes.  Psych: Good eye contact, normal affect.  Memory intact, not anxious or depressed appearing.    Impression & Recommendations:  Problem # 1:  HOARSENESS, CHRONIC (BJY-782.95) Assessment Unchanged  Orders: ENT Referral (ENT)  Problem # 2:  CHRONIC LARYNGITIS (ICD-476.0) Assessment: Unchanged  Orders: ENT Referral (ENT)  Problem # 3:  OBESITY (ICD-278.00) Assessment: Deteriorated  Ht: 62.5 (10/09/2009)   Wt: 230.25 (10/09/2009)   BMI: 41.59 (10/09/2009)  Problem # 4:  GERD (ICD-530.81) Assessment: Unchanged  Her updated medication list for this problem includes:    Prilosec 20 Mg Cpdr (Omeprazole) .Marland Kitchen... Take 1 tab two times a day  Problem # 5:  ESSENTIAL HYPERTENSION (ICD-401.9) Assessment: Unchanged  The following medications were removed from the medication list:    Losartan Potassium-hctz 100-12.5 Mg Tabs (Losartan potassium-hctz) .Marland Kitchen... Take 1 tablet by mouth once a day Her updated medication list for this problem includes:    Amlodipine Besylate  10 Mg Tabs (Amlodipine besylate) .Marland Kitchen... Take 1 tablet by mouth once a day    Maxzide-25 37.5-25 Mg Tabs (Triamterene-hctz) .Marland Kitchen... Take 1 tablet by mouth once a day  BP today: 130/80 Prior BP: 122/82 (07/31/2009)  Prior 10 Yr Risk Heart Disease: 24 % (03/04/2009)  Labs Reviewed: K+: 3.9 (08/12/2009) Creat: : 0.75 (08/12/2009)   Chol: 106 (06/19/2009)   HDL: 45 (06/19/2009)   LDL: 50 (06/19/2009)   TG: 57 (06/19/2009)  Problem # 6:  COUGH (ICD-786.2) Assessment: Deteriorated  Orders: Pulmonary Referral (Pulmonary) ENT Referral (ENT)  Problem # 7:  DIABETES (ICD-250.00) Assessment: Improved  The following medications were removed from the medication list:    Losartan Potassium-hctz 100-12.5 Mg Tabs (Losartan potassium-hctz) .Marland Kitchen... Take 1 tablet by mouth once a day Her updated medication list for this problem includes:    Aspirin 81 Mg Tbec (Aspirin) ..... One tab by mouth once daily    Metformin Hcl 1000 Mg Tabs (Metformin hcl) ..... One tab by mouth two times a day    Glipizide 5 Mg Tabs (Glipizide) ..... One and a half tablets twice daily  Orders: T- Hemoglobin A1C ([id number])  Labs Reviewed: Creat: 0.75 (08/12/2009)     Last Eye Exam: normal (09/16/2008) Reviewed HgBA1c results: 7.1 (08/12/2009)  7.9 (06/19/2009)  Complete Medication List: 1)  Aspirin 81 Mg Tbec (Aspirin) .... One tab by mouth once daily 2)  Proair Hfa 108 (90 Base) Mcg/act Aers (Albuterol sulfate) .... One puff two times a day as needed 3)  Metformin Hcl 1000 Mg Tabs (Metformin hcl) .... One tab by mouth two times a day 4)  Amlodipine Besylate 10 Mg Tabs (Amlodipine besylate) .... Take 1 tablet by mouth once a day 5)  Vitamin D 400 Unit Tabs (Cholecalciferol) .... Take 1 tab daily 6)  Ipratropium-albuterol 0.5-2.5 (3) Mg/19ml Soln (Ipratropium-albuterol) .... One vial three times a day as needed for 1 month 7)  Prilosec 20 Mg Cpdr (Omeprazole) .... Take 1 tab two times a day 8)  Glipizide 5 Mg Tabs  (Glipizide) .... One and a half tablets twice daily 9)  Zolpidem Tartrate 10 Mg Tabs (Zolpidem tartrate) .... Take 1 tab by mouth at bedtime 10)  Maxzide-25 37.5-25 Mg Tabs (Triamterene-hctz) .... Take 1 tablet by mouth once a day 11)  Pravastatin Sodium 40 Mg Tabs (Pravastatin sodium) .... Take 1 tab by mouth at bedtime 12)  Qvar 80 Mcg/act Aers (Beclomethasone dipropionate) .... One inhalation twice daily  Other Orders: T-Basic Metabolic Panel [id number]) T-Hepatic Function [phone]) T-Lipid Profile [id number])  Patient Instructions: 1)  F/U mid September. 2)  It is important that you exercise regularly at least 20 minutes 5 times a week. If you develop chest pain, have severe difficulty breathing, or feel very tired , stop exercising immediately and seek medical attention. 3)  You need to lose weight. Consider a lower calorie diet and regular exercise.  4)  Check your blood sugars regularly. If your readings are usually above : or below 70 you should contact our office. 5)  HbgA1C prior  to visit, ICD-9: 6)  BMP prior to visit, ICD-9: 7)  Hepatic Panel prior to visit, ICD-9:   fasting mid september 8)  Lipid Panel prior to visit, ICD-9: 9)  You will be referred to the pulmonary specialist in [place].You will aklso be referred to ENT 10)  PLS stop losartan and simvastatin  Prescriptions: QVAR 80 MCG/ACT AERS (BECLOMETHASONE DIPROPIONATE) one inhalation twice daily  #1 x 0   Entered and Authorized by:   Syliva Overman MD   Signed by:   Syliva Overman MD on 10/09/2009   Method used:   Samples Given   RxID:   [id number] PRAVASTATIN SODIUM 40 MG TABS (PRAVASTATIN SODIUM) Take 1 tab by mouth at bedtime  #30 x 4   Entered and Authorized by:   Syliva Overman MD   Signed by:   Syliva Overman MD on 10/09/2009   Method used:   Printed then faxed to ...       [facility] (retail)       [address]       [place], [place]   [address]       Ph: [phone]       Fax: [phone]   RxID:   [id number] MAXZIDE-25 37.5-25 MG TABS (TRIAMTERENE-HCTZ) Take 1 tablet by mouth once a day  #30 x 0   Entered and Authorized by:   Syliva Overman MD   Signed by:   Syliva Overman MD on 10/09/2009   Method used:   Printed then faxed to ...       [facility] (retail)       [address]       [place], [place]  [address]       Ph: [phone]       Fax: [phone]   RxID:   [id number]

## 2010-04-06 NOTE — Cardiovascular Report (Signed)
Summary: Cardiac Cath Other 05-31-1988  Cardiac Cath Other 05-31-1988   Imported By: Faythe Ghee 05/13/2009 14:11:45  _____________________________________________________________________  External Attachment:    Type:   Image     Comment:   External Document

## 2010-04-06 NOTE — Miscellaneous (Signed)
Summary: refill  Clinical Lists Changes  Medications: Added new medication of IPRATROPIUM-ALBUTEROL 0.5-2.5 (3) MG/3ML SOLN (IPRATROPIUM-ALBUTEROL) one vial three times a day as needed for 1 month - Signed Rx of IPRATROPIUM-ALBUTEROL 0.5-2.5 (3) MG/3ML SOLN (IPRATROPIUM-ALBUTEROL) one vial three times a day as needed for 1 month;  #167ml x 3;  Signed;  Entered by: Worthy Keeler LPN;  Authorized by: Syliva Overman MD;  Method used: Electronically to [facility] [address], [place], [place], [place]  [address], Ph: [phone], Fax: [phone]    Prescriptions: IPRATROPIUM-ALBUTEROL 0.5-2.5 (3) MG/3ML SOLN (IPRATROPIUM-ALBUTEROL) one vial three times a day as needed for 1 month  #18ml x 3   Entered by:   Worthy Keeler LPN   Authorized by:   Syliva Overman MD   Signed by:   Worthy Keeler LPN on 46/96/2952   Method used:   Electronically to        [facility]* (retail)       [address]       [place], [place]  [address]       Ph: [phone]       Fax: [phone]   RxID:   [id number]

## 2010-04-07 ENCOUNTER — Encounter: Payer: Self-pay | Admitting: Orthopedic Surgery

## 2010-04-08 NOTE — Assessment & Plan Note (Signed)
Summary: office visit   Vital Signs:  Patient profile:   65 year old female Menstrual status:  postmenopausal Height:      62.5 inches Weight:      230.75 pounds BMI:     41.68 O2 Sat:      95 % Pulse rate:   96 / minute Pulse rhythm:   regular Resp:     16 per minute BP sitting:   140 / 90  (left arm) Cuff size:   large  Vitals Entered By: Everitt Amber LPN (March 19, 2010 9:39 AM)  Nutrition Counseling: Patient's BMI is greater than 25 and therefore counseled on weight management options. CC: Follow up chronic problems   Primary Care Provider:  Syliva Overman MD  CC:  Follow up chronic problems.  History of Present Illness: Reports  that she has been doing fairly well. Still reports having no diabetic testing supplies despite being sent to thwe pharmacy multiple times.  She is frustrated with weight. Denies recent fever or chills. Denies sinus pressure, nasal congestion , ear pain or sore throat. Denies chest congestion, or cough productive of sputum. Denies chest pain, palpitations, PND, orthopnea or leg swelling. Denies abdominal pain, nausea, vomitting, diarrhea or constipation. Denies change in bowel movements or bloody stool. Denies dysuria , frequency, incontinence or hesitancy. Denies  joint pain, swelling, or reduced mobility. Denies headaches, vertigo, seizures.  Denies  rash, lesions, or itch.     Allergies: No Known Drug Allergies  Review of Systems      See HPI General:  Complains of fatigue. Eyes:  Denies blurring and discharge. MS:  Complains of joint pain, low back pain, mid back pain, and stiffness. Psych:  Complains of anxiety, depression, and mental problems; denies suicidal thoughts/plans, thoughts of violence, and unusual visions or sounds; improved . Endo:  Denies excessive thirst and excessive urination. Heme:  Denies abnormal bruising and bleeding. Allergy:  Denies hives or rash and itching eyes.  Physical Exam  General:   Well-developed,obese,in no acute distress; alert,appropriate and cooperative throughout examination HEENT: No facial asymmetry,  EOMI, No sinus tenderness,  oropharynx  pink and moist.   Chest: Clear to auscultation bilaterally.  CVS: S1, S2, No murmurs, No S3.   Abd: Soft, Nontender.  MS: Adequate ROM spine, hips, shoulders and knees.  Ext: No edema.   CNS: CN 2-12 intact, power tone and sensation normal throughout.   Skin: Intact, no visible lesions or rashes.  Psych: Good eye contact, normal affect.  Memory intact,  depressed appearing.    Impression & Recommendations:  Problem # 1:  DEPRESSION (ICD-311) Assessment Improved  The following medications were removed from the medication list:    Fluoxetine Hcl 10 Mg Caps (Fluoxetine hcl) .Marland Kitchen... Take 1 capsule by mouth once a day  Problem # 2:  OBESITY (ICD-278.00) Assessment: Unchanged  Ht: 62.5 (03/19/2010)   Wt: 230.75 (03/19/2010)   BMI: 41.68 (03/19/2010) therapeutic lifestyle change discussed and encouraged  Problem # 3:  DIABETES (ICD-250.00) Assessment: Comment Only  Her updated medication list for this problem includes:    Aspirin 81 Mg Tbec (Aspirin) ..... One tab by mouth once daily    Metformin Hcl 1000 Mg Tabs (Metformin hcl) ..... One tab by mouth two times a day    Glipizide 10 Mg Tabs (Glipizide) .Marland Kitchen... Take 1 tablet by mouth two times a day Patient advised to reduce carbs and sweets, commit to regular physical activity, take meds as prescribed, test blood sugars as directed, and  attempt to lose weight , to improve blood sugar control.  Orders: T- Hemoglobin A1C (62130-86578)  Labs Reviewed: Creat: 0.91 (11/19/2009)     Last Eye Exam: normal (09/16/2008) Reviewed HgBA1c results: 8.2 (11/19/2009)  7.1 (08/12/2009)  Problem # 4:  ESSENTIAL HYPERTENSION (ICD-401.9) Assessment: Unchanged  The following medications were removed from the medication list:    Maxzide-25 37.5-25 Mg Tabs (Triamterene-hctz) .Marland Kitchen...  Take 1 tablet by mouth once a day Her updated medication list for this problem includes:    Amlodipine Besylate 10 Mg Tabs (Amlodipine besylate) .Marland Kitchen... Take 1 tablet by mouth once a day    Maxzide-25 37.5-25 Mg Tabs (Triamterene-hctz) .Marland Kitchen... Take 1 tablet by mouth once a day .bop  Orders: Medicare Electronic Prescription [phone])  BP today: 140/90 Prior BP: 140/90 (11/27/2009)  Prior 10 Yr Risk Heart Disease: 24 % (03/04/2009)  Labs Reviewed: K+: 4.4 (11/19/2009) Creat: : 0.91 (11/19/2009)   Chol: 163 (11/19/2009)   HDL: 56 (11/19/2009)   LDL: 89 (11/19/2009)   TG: 92 (11/19/2009)  Complete Medication List: 1)  Aspirin 81 Mg Tbec (Aspirin) .... One tab by mouth once daily 2)  Metformin Hcl 1000 Mg Tabs (Metformin hcl) .... One tab by mouth two times a day 3)  Amlodipine Besylate 10 Mg Tabs (Amlodipine besylate) .... Take 1 tablet by mouth once a day 4)  Vitamin D 400 Unit Tabs (Cholecalciferol) .... Take 1 tab daily 5)  Ipratropium-albuterol 0.5-2.5 (3) Mg/8ml Soln (Ipratropium-albuterol) .... One vial three times a day as needed for 1 month 6)  Zolpidem Tartrate 10 Mg Tabs (Zolpidem tartrate) .... Take 1 tab by mouth at bedtime 7)  Pravastatin Sodium 40 Mg Tabs (Pravastatin sodium) .... Take 1 tab by mouth at bedtime 8)  Qvar 80 Mcg/act Aers (Beclomethasone dipropionate) .... One inhalation twice daily 9)  Glipizide 10 Mg Tabs (Glipizide) .... Take 1 tablet by mouth two times a day 10)  Lancets and Strips For Accu-chek Plus Meter  .... Two times a day testing 11)  Ipratropium-albuterol 0.5-2.5 (3) Mg/3ml Soln (Ipratropium-albuterol) .... One inhalation every 6-8 hrs as needed for wheezing 12)  Accu-chek Compact Test Drum Strp (Glucose blood) .... Once daily testing dx:250.00 13)  Accu-chek Multiclix Lancets Misc (Lancets) .... Once daily testing dx:250.00 14)  Maxzide-25 37.5-25 Mg Tabs (Triamterene-hctz) .... Take 1 tablet by mouth once a day  Other Orders: T-Basic Metabolic Panel  ([id number]) T-TSH [phone])  Patient Instructions: 1)  Please schedule a follow-up appointment in 3 months. 2)  It is important that you exercise regularly at least 20 minutes 5 times a week. If you develop chest pain, have severe difficulty breathing, or feel very tired , stop exercising immediately and seek medical attention. 3)  You need to lose weight. Consider a lower calorie diet and regular exercise.  4)  BMP prior to visit, ICD-9: 5)  TSH prior to visit, ICD-9:  today 6)  HbgA1C prior to visit, ICD-9: Prescriptions: MAXZIDE-25 37.5-25 MG TABS (TRIAMTERENE-HCTZ) Take 1 tablet by mouth once a day  #30 x 3   Entered and Authorized by:   Syliva Overman MD   Signed by:   Syliva Overman MD on 03/19/2010   Method used:   Electronically to        [facility]* (retail)       [address] [place], [place]  [address]       Ph: [phone]  Fax: [phone]   RxID:   [id number]    Orders Added: 1)  Est. Patient Level IV [62952] 2)  Medicare Electronic Prescription [G8553] 3)  T-Basic Metabolic Panel [[id number]] 4)  T- Hemoglobin A1C [83036-23375] 5)  T-TSH [[id number]]

## 2010-04-08 NOTE — Letter (Signed)
Summary: MEDICAL RELEASE  MEDICAL RELEASE   Imported By: Lind Guest 03/31/2010 09:00:10  _____________________________________________________________________  External Attachment:    Type:   Image     Comment:   External Document

## 2010-04-08 NOTE — Letter (Signed)
Summary: rx for medicine  rx for medicine   Imported By: Lind Guest 03/19/2010 13:32:21  _____________________________________________________________________  External Attachment:    Type:   Image     Comment:   External Document

## 2010-04-12 ENCOUNTER — Encounter: Payer: Self-pay | Admitting: Family Medicine

## 2010-04-15 ENCOUNTER — Other Ambulatory Visit: Payer: Self-pay | Admitting: Family Medicine

## 2010-04-15 DIAGNOSIS — Z139 Encounter for screening, unspecified: Secondary | ICD-10-CM

## 2010-04-16 ENCOUNTER — Telehealth: Payer: Self-pay | Admitting: Family Medicine

## 2010-04-22 NOTE — Progress Notes (Signed)
Summary: papers  Phone Note Call from Patient   Summary of Call: pt wanting to know if paper has been sent in for her?  [phone] Initial call taken by: Rudene Anda,  April 16, 2010 2:33 PM    Prescriptions: IPRATROPIUM-ALBUTEROL 0.5-2.5 (3) MG/3ML SOLN (IPRATROPIUM-ALBUTEROL) one inhalation every 6-8 hrs as needed for wheezing  #90days x 0   Entered by:   Everitt Amber LPN   Authorized by:   Syliva Overman MD   Signed by:   Everitt Amber LPN on 11/91/4782   Method used:   Printed then faxed to ...       [facility]* (retail)       [address]       [place], [place]  [address]       Ph: [phone]       Fax: [phone]   RxID:   [id number] MAXZIDE-25 37.5-25 MG TABS (TRIAMTERENE-HCTZ) Take 1 tablet by mouth once a day  #90 x 0   Entered by:   Everitt Amber LPN   Authorized by:   Syliva Overman MD   Signed by:   Everitt Amber LPN on 47/42/5956   Method used:   Printed then faxed to ...       [facility]* (retail)       [address]       [place], [place]  [address]       Ph: [phone]       Fax: [phone]   RxID:   [id number] ZOLPIDEM TARTRATE 10 MG TABS (ZOLPIDEM TARTRATE) Take 1 tab by mouth at bedtime  #90 x 0   Entered by:   Everitt Amber LPN   Authorized by:   Syliva Overman MD   Signed by:   Everitt Amber LPN on 27/08/2374   Method used:   Printed then faxed to ...       [facility]* (retail)       [address]       [place], [place]  [address]       Ph: [phone]       Fax: [phone]   RxID:   [id number] PRAVASTATIN SODIUM 40 MG TABS (PRAVASTATIN SODIUM) Take 1 tab by mouth at bedtime  #90 x 0   Entered by:   Everitt Amber LPN   Authorized by:   Syliva Overman MD   Signed by:   Everitt Amber LPN on 71/69/6789   Method used:   Printed then faxed to ...       [facility]* (retail)       [address]  [place], [place]  [address]       Ph: [phone]       Fax: [phone]   RxID:   [id number] GLIPIZIDE 10 MG TABS (GLIPIZIDE) Take 1 tablet by mouth two times a day  #180 x 0   Entered by:   Everitt Amber LPN   Authorized by:   Syliva Overman MD   Signed by:   Everitt Amber LPN on 93/26/7124   Method used:   Printed then faxed to ...       [facility] (retail)       [address]  [place], [place]  [address]       Ph: [phone]       Fax: [phone]   RxID:   [id number] METFORMIN HCL 1000 MG  TABS (METFORMIN HCL) one tab by mouth two times a day  #180 x 0   Entered by:   Everitt Amber LPN   Authorized by:   Syliva Overman MD   Signed by:   Everitt Amber LPN on 38/75/6433   Method used:   Printed then faxed to ...       [facility] (retail)       [address]       [place], [place]  [address]       Ph: [phone]       Fax: [phone]   RxID:   [id number] AMLODIPINE BESYLATE 10 MG TABS (AMLODIPINE BESYLATE) Take 1 tablet by mouth once a day  #90 x 0   Entered by:   Everitt Amber LPN   Authorized by:   Syliva Overman MD   Signed by:   Everitt Amber LPN on 28/31/5176   Method used:   Printed then faxed to ...       [facility] (retail)       [address]       [place], [place]  [address]       Ph: [phone]       Fax: [phone]   RxID:   [id number]

## 2010-04-22 NOTE — Letter (Signed)
Summary: Medical record request [facility]  Medical record request [facility]   Imported By: Cammie Sickle 04/12/2010 20:07:37  _____________________________________________________________________  External Attachment:    Type:   Image     Comment:   External Document

## 2010-04-22 NOTE — Letter (Signed)
Summary: medical release  medical release   Imported By: Lind Guest 04/12/2010 14:10:34  _____________________________________________________________________  External Attachment:    Type:   Image     Comment:   External Document

## 2010-05-03 ENCOUNTER — Ambulatory Visit (HOSPITAL_COMMUNITY): Payer: Medicare HMO

## 2010-05-11 ENCOUNTER — Ambulatory Visit (HOSPITAL_COMMUNITY)
Admission: RE | Admit: 2010-05-11 | Discharge: 2010-05-11 | Disposition: A | Discharge status: Home / Self Care | Payer: Medicare HMO | Source: Ambulatory Visit | Attending: Family Medicine | Admitting: Family Medicine

## 2010-05-11 DIAGNOSIS — Z139 Encounter for screening, unspecified: Secondary | ICD-10-CM

## 2010-05-11 DIAGNOSIS — Z1231 Encounter for screening mammogram for malignant neoplasm of breast: Secondary | ICD-10-CM | POA: Insufficient documentation

## 2010-05-20 ENCOUNTER — Encounter: Payer: Self-pay | Admitting: Family Medicine

## 2010-05-25 NOTE — Letter (Signed)
[facility]   Imported By: Lind Guest 05/20/2010 10:32:45  _____________________________________________________________________  External Attachment:    Type:   Image     Comment:   External Document

## 2010-06-24 ENCOUNTER — Encounter: Payer: Self-pay | Admitting: Family Medicine

## 2010-06-25 ENCOUNTER — Encounter: Payer: Self-pay | Admitting: Family Medicine

## 2010-07-23 NOTE — Op Note (Signed)
NAME:  Tiffany Jacobs, Tiffany Jacobs NO.:  [id number]   MEDICAL RECORD NO.:  [id number]          PATIENT TYPE:  AMB   LOCATION:  DAY                           FACILITY:  [facility]   PHYSICIAN:  Kassie Mends, M.D.      DATE OF BIRTH:  Jul 12, 1945   DATE OF PROCEDURE:  07/27/2006  DATE OF DISCHARGE:                               OPERATIVE REPORT   PROCEDURE:  Colonoscopy.   INDICATIONS FOR PROCEDURE:  Tiffany Jacobs is a 65 year old female who  presents for average risk colon cancer screening.   FINDINGS:  1. Ascending colon diverticulosis; otherwise, normal colon without      evidence of polyps, masses, inflammatory changes, or AVMs.  2. Normal retroflexed view of the rectum.   RECOMMENDATIONS:  1. High fiber diet.  Tiffany Jacobs is given a handout on high fiber      diet and diverticulosis.  2. Screening colonoscopy in 10 years.   MEDICATIONS:  1. Demerol 75 mg IV.  2. Versed 4 mg IV.   PROCEDURE TECHNIQUE:  Physical exam was performed, and informed consent  was obtained from the patient after explaining the benefits, risks, and  alternatives to the procedure.  The patient was connected to the monitor  and placed in the left lateral position.  After administration of  sedation and rectal exam, the patient's rectum was intubated and the  scope advanced under direct visualization to the cecum.  The scope was  subsequently removed slowly by carefully examining the color, texture,  anatomy, and integrity of the mucosa on the way out.  The patient was  recovered in endoscopy and discharged home in satisfactory condition.      Kassie Mends, M.D.  Electronically Signed     SM/MEDQ  D:  07/27/2006  T:  07/27/2006  Job:  [id number]   cc:   Milus Mallick. Lodema Hong, M.D.  Fax: [phone]

## 2010-07-23 NOTE — H&P (Signed)
[facility]  Patient:    Tiffany Jacobs, Tiffany Jacobs Visit Number: [id number] MRN: [id number]          Service Type: OBV Location: 2A A222 01 Attending Physician:  Herbert Seta Dictated by:   Butch Penny, M.D. Admit Date:  04/20/2001                           History and Physical  BRIEF HISTORY:  A 65 year old white female apparently had a hot and cold feeling at work and noted pain in the left arm with tingling sensation.  The patient does have a prior history of heart disease.  She presented herself to the emergency department where she was evaluated.  LABORATORY DATA:  Chemistries:   Sodium 139, potassium 3.6, chloride 104, CO2 28.  Cardiac enzymes:  CPK 246, CPK MB 3.1.  Relative index 1.3.  Troponin 0.01.  Electrocardiogram:  Sinus tachycardia.  Nonspecific T wave abnormalities.  Due to the patients prior history of heart disease it was felt that she should be admitted to rule out coronary syndrome.  FAMILY HISTORY:  See previous records.  SOCIAL HISTORY:  The patient continues to smoke.  PAST HISTORY:  Angioplasty in 1990.  Heart disease, intermittent since 1990 but has not been totally evaluated since that time.  Does seek medical help. Patient has no allergies and takes no medications.  REVIEW OF SYSTEMS:  HEENT:  Negative.  Cardiopulmonary:  No cough, hemoptysis, or dyspnea.  GI:  No bowel irregularity or bleeding.  GU:  No dysuria, hematuria.  PHYSICAL EXAMINATION:  GENERAL:  Alert, white female.  Blood pressure 144/54, respirations 20, pulse 93, temperature 98.6.  HEENT:  Eyes:  PERRLA, TM negative.  Oropharynx benign.  NECK:  Supple.  No JVD or thyroid abnormalities.  LUNGS:  Clear to percussion and auscultation.  HEART:  Regular, no murmurs.  ABDOMEN:  No palpable organs or masses.  SKIN:  Warm and dry.  DIAGNOSES: 1. Left arm pain. 2. History of coronary artery disease. Dictated by:   Butch Penny, M.D. Attending  Physician:  Herbert Seta DD:  04/21/01 TD:  04/21/01 Job: [id number]

## 2010-07-23 NOTE — Discharge Summary (Signed)
NAMEMarland Kitchen  Tiffany, Jacobs NO.:  [id number]   MEDICAL RECORD NO.:  [id number]          PATIENT TYPE:  INP   LOCATION:  A323                          FACILITY:  [facility]   PHYSICIAN:  Vickki Hearing, M.D.DATE OF BIRTH:  [birth year]   DATE OF ADMISSION:  02/01/2005  DATE OF DISCHARGE:  12/02/2006LH                                 DISCHARGE SUMMARY   ADMITTING DIAGNOSIS:  Osteoarthritis, left knee.   DISCHARGE DIAGNOSIS:  Osteoarthritis, left knee.   PROCEDURE:  Left total knee replacement.   SURGEON:  Vickki Hearing, M.D.   ANESTHESIA:  Spinal.   TOURNIQUET TIME:  One hour and 45 minutes.   IMPLANTS:  DePuy #2 femur, #2.5 mobile bearing tray, 15 mm polyethylene  posterior stabilized insert, 32 mm polyethylene patella.   HISTORY:  This is a 65 year old female end-stage osteoarthritis left knee  who had persistent, progressive pain and difficulty with routine activities  of daily living and consented to a left total knee replacement.   She was admitted on the date stated, tolerated procedure well.  Postoperatively, advanced slowly with rehab but was able to ambulate though  her motion was only zero to 70 with no progression between the 30th and the  1st.  She was encouraged to try to work with the therapist a little bit more  to increase her flexion.  She will be placed in a CPM at home and will  progress with therapy as tolerated.  A follow-up date is scheduled for  February 21, 2005 in the office for a wound check. No x-rays are needed at  that time.   MEDICATIONS:  1.  Ecotrin 1 twice a day for four weeks starting February 16, 2005.  2.  Lovenox 30 mg twice a day for 10 days.  3.  Colace 100 mg twice a day.  4.  Feosol one twice a day.  5.  Percocet 5/325 one q.4.  6.  She is to resume Glipizide.  7.  Avalide.  8.  Metformin.  9.  Astelin Spray.  10. Crestor.  11. Pepcid AC as needed.  12. Also additional medication is Flexeril 5 mg.   INSTRUCTIONS:  Staples come out on February 14, 2005.   DISPOSITION:  To home, condition is improved.      Vickki Hearing, M.D.  Electronically Signed     SEH/MEDQ  D:  02/05/2005  T:  02/05/2005  Job:  [id number]

## 2010-07-23 NOTE — Group Therapy Note (Signed)
[facility]  Patient:    Tiffany Jacobs, Tiffany Jacobs Visit Number: [id number] MRN: [id number]          Service Type: OBV Location: 2A A222 01 Attending Physician:  Herbert Seta Dictated by:   Butch Penny, M.D. Proc. Date: 04/21/01 Admit Date:  04/20/2001                               Progress Note  PROBLEM:  This patient was admitted to rule out coronary syndrome.  SUBJECTIVE:  She has had no further chest pain.  Cardiac enzymes are being monitored.  OBJECTIVE:  Vital signs:  Blood pressure 114/53, respirations 24, pulse 94, temperature 99 degrees.  Lungs:  Clear to P&A.  Heart:  A regular rhythm.  ASSESSMENT 1. The patient is admitted with left arm pain. 2. She has a history of coronary artery disease in the past.  PLAN:  To continue to monitor the cardiac enzymes, etc. Dictated by:   Butch Penny, M.D. Attending Physician:  Herbert Seta DD:  04/21/01 TD:  04/21/01 Job: [id number]

## 2010-07-23 NOTE — Op Note (Signed)
NAMEMarland Kitchen  APOORVA, BUGAY NO.:  [id number]   MEDICAL RECORD NO.:  [id number]          PATIENT TYPE:  INP   LOCATION:  A323                          FACILITY:  [facility]   PHYSICIAN:  Vickki Hearing, M.D.DATE OF BIRTH:  10/21/1945   DATE OF PROCEDURE:  02/01/2005  DATE OF DISCHARGE:                                 OPERATIVE REPORT   PREOPERATIVE DIAGNOSIS:  Osteoarthritis left knee.   POSTOPERATIVE DIAGNOSIS:  Osteoarthritis left knee.   PROCEDURE:  Left total knee replacement.   IMPLANTS:  DePuy 2 femur, 2.5 mobile bearing tray, 15-mm polyethylene  posterior stabilized insert, 32-mm patella. Also inserted one Hemovac drain  subcu and one pain pump catheter intra-articular.   ANESTHETIC:  Spinal.   BLOOD LOSS:  Minimal.   TOURNIQUET TIME:  1 hour and 45 minutes.   OPERATIVE FINDINGS:  Osteoarthritis of the knee with complete bone loss  medial compartment, moderate to severe bone loss patellofemoral compartment  and moderate bone loss lateral compartment. In addition, there were multiple  loose bodies resected and removed.   HISTORY:  She is 59 years, presented with bilateral osteoarthritis, end-  stage disease and severe pain. Radiographs indicative of osteoarthritis,  three compartments, medial side greater than lateral or patellofemoral.   The patient was identified as Rayann Heman. Her chart was reviewed,  history and physical was updated, her left knee was marked as a surgical  site by the patient, countersigned by the surgeon, antibiotics were started.  She was taken to the operating room for spinal anesthetic. After adequate  anesthesia, she was placed supine on the operating table. Foley catheter was  inserted. Tourniquet was applied to the left thigh. The left lower extremity  was prepped and draped in sterile technique. Time-out was completed.   Tourniquet was elevated 300 mmHg after exsanguination of the limb, and  incision was made with the  knee in flexion. We used a midline incision. We  divided the subcu tissue down to the extensor mechanism. We performed a  medial arthrotomy, resected the patellofemoral ligament, everted the  patella, removed osteophytes, removed the ACL, PCL, medial and lateral  menisci. Subperiosteal dissection was carried out to the midline of the  tibia in the AP plane, and tibia was subluxated forward. Tibial guide was  set for a neutral cut, and 10 mm of bone was resected from the normal  lateral side. The tibia measured a 2.5.   We prepared the femur by inserting a 3/8-inch drill bit, decompressing the  femoral canal, setting the distal femoral cut for 11. We resected the distal  femur with an oscillating saw, checked the flexion gap. We were easily able  to get a 12.5 and a 15 mm insert in.   We then measured the femur to a size 2, set the 4 in 1 cutting block and  made the four cuts. We then checked the flexion gap. Again, a 15 mm insert  fit very well and the knee was balanced.   We then resected any residual osteophytes, cut the patella from a 20 down to  an 11, drilled peg holes for  a size 32 patella. Then did a trial reduction  with a rotating platform trial. I was satisfied with that. We got a good  stable implant with only 10 degrees of rotation, and then we prepared the  tibia in a standard fashion.   We then trialed with the femoral and tibial implants and a 12.5 and a 15  poly insert. The 15 fit best, good flexion, no lift-off, adequate extension  if not hyperextension and good patellar tracking.   The trial implants were removed. The knee was irrigated. The bone surfaces  were dry. The cement was mixed and then applied to the bone and implants,  and the implants were inserted. Once the implants cured, we trialed again  with a 12.5 and a 15 mm polyethylene posterior stabilized implant. I was  more satisfied with the 15 mm implant, and we inserted that. We then checked  our range of  motion again. It was adequate. Knee was stable. We irrigated  the knee one final time, removed any excess cement, closed the extensor  mechanism with interrupted Bralon suture over a pain pump catheter, then  closed the subcu over Hemovac drain with 0-0 and 2-0 Monocryl. We placed  staples in the skin, sterile dressings, released a tourniquet, applied a  CryoCuff and Ace bandage as well and then took the patient to the recovery  room in stable condition.   Her plan will be full weightbearing, normal total knee protocol, and plan is  for her to go home with supportive family.      Vickki Hearing, M.D.  Electronically Signed     SEH/MEDQ  D:  02/01/2005  T:  02/01/2005  Job:  [id number]

## 2010-07-23 NOTE — H&P (Signed)
NAMEMarland Kitchen  KIMM, SIDER NO.:  [id number]   MEDICAL RECORD NO.:  [id number]          PATIENT TYPE:  AMB   LOCATION:  DAY                           FACILITY:  [facility]   PHYSICIAN:  Vickki Hearing, M.D.DATE OF BIRTH:  12-13-1945   DATE OF ADMISSION:  DATE OF DISCHARGE:  LH                                HISTORY & PHYSICAL   CHIEF COMPLAINT:  Bilateral knee pain.   She is 65 years old.  She has bilateral knee pain, left greater than right,  primarily medial described as a constant aching discomfort exacerbated by  weightbearing.  She has some mild left hip pain that radiates to the left  knee. This comes and goes but is in no way as severe as her medial knee pain  on the left.   She had a 10-point Review of Systems reported as normal.  She has no  allergies.  She has hypertension, diabetes, increased cholesterol, and takes  diabetic medication as well as cholesterol medication and a blood pressure  pill.  She has not had any previous surgery.   She has a negative family history.   SOCIAL HISTORY:  She lives with her husband.  She is disabled.  She is  married, does not smoke or drink.  High school education was completed, and  her family physician is Dr. Syliva Overman.   PHYSICAL EXAMINATION:  VITAL SIGNS:  Weight 231.  Pulse 78, respiratory rate  18.  Height 5 feet 2-1/2 inches.  GENERAL:  She is obese, well-developed, well-nourished.  Grooming and  hygiene normal.  No gross deformities.  PERIPHERAL VASCULAR SYSTEM: Shows normal pulses and perfusion without venous  stasis disease, temperature normal.  LYMPH SYSTEM:  Negative for lymphadenopathy.  MUSCULOSKELETAL:  Severe crepitance, medial compartment tenderness, varus  alignment to both knees. Range of motion is approximately 5 to 95 degrees.  Ligaments are stable. Extensor mechanism intact.  SKIN:  Normal.  NEUROPSYCH:  She is awake, alert, and oriented x3 with normal mood.  Normal  sensation,  coordination, and reflexes are seen.   Radiographs are done which do show some degenerative changes at L5-S1 in the  back, osteoarthritis of both knees.   She has osteoarthritis of the left knee symptomatic more than right and will  have a left total knee replacement.  The risks and benefits of the procedure  have been explained.  The patient agrees to proceed with surgery, and all  questions were answered.      Vickki Hearing, M.D.  Electronically Signed     SEH/MEDQ  D:  01/31/2005  T:  01/31/2005  Job:  [id number]   cc:   [facility]  Fax: [phone]   Milus Mallick. Lodema Hong, M.D.  Fax: [phone]

## 2010-08-11 ENCOUNTER — Encounter: Payer: Self-pay | Admitting: Family Medicine

## 2010-08-13 ENCOUNTER — Ambulatory Visit (INDEPENDENT_AMBULATORY_CARE_PROVIDER_SITE_OTHER): Payer: Medicare HMO | Admitting: Family Medicine

## 2010-08-13 ENCOUNTER — Encounter: Payer: Self-pay | Admitting: Family Medicine

## 2010-08-13 VITALS — BP 130/90 | HR 77 | Resp 16 | Ht 62.5 in | Wt 228.0 lb

## 2010-08-13 DIAGNOSIS — E119 Type 2 diabetes mellitus without complications: Secondary | ICD-10-CM

## 2010-08-13 DIAGNOSIS — J45991 Cough variant asthma: Secondary | ICD-10-CM

## 2010-08-13 DIAGNOSIS — E669 Obesity, unspecified: Secondary | ICD-10-CM

## 2010-08-13 DIAGNOSIS — I10 Essential (primary) hypertension: Secondary | ICD-10-CM

## 2010-08-13 DIAGNOSIS — Z79899 Other long term (current) drug therapy: Secondary | ICD-10-CM

## 2010-08-13 LAB — LIPID PANEL
Total CHOL/HDL Ratio: 3.5 Ratio
VLDL: 17 mg/dL (ref 0–40)

## 2010-08-13 MED ORDER — METFORMIN HCL 1000 MG PO TABS: 1000.0000 mg | ORAL_TABLET | Freq: Two times a day (BID) | ORAL | Status: DC

## 2010-08-13 MED ORDER — ZOLPIDEM TARTRATE 10 MG PO TABS: 10.0000 mg | ORAL_TABLET | Freq: Every evening | ORAL | Status: DC | PRN

## 2010-08-13 MED ORDER — AMLODIPINE BESYLATE 10 MG PO TABS: 10.0000 mg | ORAL_TABLET | Freq: Every day | ORAL | Status: DC

## 2010-08-13 MED ORDER — TRIAMTERENE-HCTZ 37.5-25 MG PO TABS: 1.0000 | ORAL_TABLET | Freq: Every day | ORAL | Status: DC

## 2010-08-13 MED ORDER — PRAVASTATIN SODIUM 40 MG PO TABS: 40.0000 mg | ORAL_TABLET | Freq: Every day | ORAL | Status: DC

## 2010-08-13 MED ORDER — ALBUTEROL SULFATE HFA 108 (90 BASE) MCG/ACT IN AERS: 2.0000 | INHALATION_SPRAY | Freq: Four times a day (QID) | RESPIRATORY_TRACT | Status: DC | PRN

## 2010-08-13 MED ORDER — GLIPIZIDE 10 MG PO TABS: 10.0000 mg | ORAL_TABLET | Freq: Two times a day (BID) | ORAL | Status: DC

## 2010-08-13 NOTE — Patient Instructions (Signed)
F/u in 3 months.  Fasting  Labs  Today.  It is important that you exercise regularly at least 30 minutes 5 times a week. If you develop chest pain, have severe difficulty breathing, or feel very tired, stop exercising immediately and seek medical attention  A healthy diet is rich in fruit, vegetables and whole grains. Poultry fish, nuts and beans are a healthy choice for protein rather then red meat. A low sodium diet and drinking 64 ounces of water daily is generally recommended. Oils and sweet should be limited. Carbohydrates especially for those who are diabetic or overweight, should be limited to 34-45 gram per meal. It is important to eat on a regular schedule, at least 3 times daily. Snacks should be primarily fruits, vegetables or nuts.   Your pneumonia vaccine is due  Happy birthday tomorrow

## 2010-08-14 ENCOUNTER — Encounter: Payer: Self-pay | Admitting: Family Medicine

## 2010-08-14 LAB — COMPLETE METABOLIC PANEL WITH GFR
ALT: 17 U/L (ref 0–35)
CO2: 33 mEq/L — ABNORMAL HIGH (ref 19–32)
Calcium: 9.4 mg/dL (ref 8.4–10.5)
Chloride: 100 mEq/L (ref 96–112)
GFR, Est African American: >60 mL/min (ref >60)
Potassium: 4.2 mEq/L (ref 3.5–5.3)
Sodium: 141 mEq/L (ref 135–145)
Total Protein: 7 g/dL (ref 6.0–8.3)

## 2010-08-14 LAB — MICROALBUMIN / CREATININE URINE RATIO
Creatinine, Urine: 109 mg/dL
Microalb, Ur: 1.76 mg/dL (ref 0.00–1.89)

## 2010-08-14 NOTE — Assessment & Plan Note (Signed)
Unchanged, reports "gaining 10 pounds, then losing 10 pounds"Encouraged more conssitent effort

## 2010-08-14 NOTE — Assessment & Plan Note (Signed)
Deteriorated, proventil MDI prescribed

## 2010-08-14 NOTE — Assessment & Plan Note (Signed)
Uncontrolled, no med change at this time, however behav changes encouraged and discussed

## 2010-08-14 NOTE — Progress Notes (Signed)
  Subjective:    Patient ID: Tiffany Jacobs, female    DOB: 1945/07/11, 65 y.o.   MRN: [id number]  HPI HYPERTENSION Disease Monitoring Blood pressure range-unnown Chest pain- no      Dyspnea- yes Medications Compliance- good Lightheadedness- no   Edema- no   DIABETES Disease Monitoring Blood Sugar ranges-150 to200 fasting Polyuria- no New Visual problems- no Medications Compliance- poor Hypoglycemic symptoms- no   HYPERLIPIDEMIA Disease Monitoring See symptoms for Hypertension Medications Compliance- poor RUQ pain- no  Muscle aches- no  Pt c/o increased shortness of breath in the past 2 to 2 weeks, has been doing more frequent neb treatments , and is requested a srcipt for an inhaler. She states she has had  no test supplies for over 1 month, her current diary  unfortunately shows high blood sugars. Her home situation appears improved, she is certainly not as depressed as she has been in the past, currently denies being at all depressed.  She reports poor eating habits in the past several months, states her children have been bringing in a lot of food for herrmost of which is not the most healthy.             Review of Systems Denies recent fever or chills. Denies sinus pressure, nasal congestion, ear pain or sore throat. Denies chest congestion or  productive cough . She has noted  Wheezing and increased dyspnea with minimal exertion in recent times, attributes this to asthma aswell as large abdominal girth Denies chest pains, palpitations, paroxysmal nocturnal dyspnea, orthopnea and leg swelling Denies abdominal pain, nausea, vomiting,diarrhea or constipation.  Denies rectal bleeding or change in bowel movement. Denies dysuria, frequency, hesitancy or incontinence. Denies joint pain, swelling and limitation in mobility. Denies headaches, seizure, numbness, or tingling. Denies uncontrolled  Depression or  Anxiety, she ha chronic  insomnia. Denies skin break down  or rash.        Objective:   Physical Exam Patient alert and oriented and in no Cardiopulmonary distress.  HEENT: No facial asymmetry, EOMI, no sinus tenderness, TM's clear, Oropharynx pink and moist.  Neck supple no adenopathy.  Chest: Clear to auscultation bilaterally.decreased air entry bilaterally  CVS: S1, S2 no murmurs, no S3.  ABD: Soft non tender. Bowel sounds normal.  Ext: No edema  MS: Adequate ROM spine, shoulders, hips and knees.  Skin: Intact, no ulcerations or rash noted.  Psych: Good eye contact, normal affect. Memory intact not anxious or depressed appearing.  CNS: CN 2-12 intact, power, tone and sensation normal throughout.        Assessment & Plan:

## 2010-08-14 NOTE — Assessment & Plan Note (Signed)
Uncontrolled, pt non compliant , though states sh is trying to do better. I have advised she likely needs insulin, wants to "work harder at control" Re educated re importance of diet, exercise, med compliance and testing

## 2010-08-27 ENCOUNTER — Telehealth: Payer: Self-pay | Admitting: *Deleted

## 2010-08-29 ENCOUNTER — Other Ambulatory Visit: Payer: Self-pay | Admitting: Family Medicine

## 2010-08-29 NOTE — Telephone Encounter (Signed)
The order is in for all forms of albuterol, pls see if the pharmacy can tell you the preferred "name " for the albuterol mDI, this is the only drug , called proair, proventil, albuterol, whichever form is preferred is what should be prescribed, pls give me f/u

## 2010-08-29 NOTE — Progress Notes (Signed)
proair named as generic , this should pass

## 2010-08-29 NOTE — Telephone Encounter (Signed)
proair is mentioned as eneric, I have entered a "generic script historically" you can fax that and see where it gets [place]

## 2010-08-30 ENCOUNTER — Telehealth: Payer: Self-pay | Admitting: Family Medicine

## 2010-08-30 ENCOUNTER — Other Ambulatory Visit: Payer: Self-pay | Admitting: *Deleted

## 2010-08-30 DIAGNOSIS — J45991 Cough variant asthma: Secondary | ICD-10-CM

## 2010-08-30 MED ORDER — ALBUTEROL SULFATE HFA 108 (90 BASE) MCG/ACT IN AERS: 2.0000 | INHALATION_SPRAY | Freq: Four times a day (QID) | RESPIRATORY_TRACT | Status: DC | PRN

## 2010-08-30 NOTE — Telephone Encounter (Signed)
Already spoke to [facility]. Didn't get the wrong meds but needed a cheaper med

## 2010-08-30 NOTE — Telephone Encounter (Signed)
Called patient left message

## 2010-08-30 NOTE — Progress Notes (Signed)
Med sent as requested 

## 2010-09-07 ENCOUNTER — Telehealth: Payer: Self-pay | Admitting: Family Medicine

## 2010-09-07 DIAGNOSIS — J45991 Cough variant asthma: Secondary | ICD-10-CM

## 2010-09-07 MED ORDER — ALBUTEROL SULFATE HFA 108 (90 BASE) MCG/ACT IN AERS: 2.0000 | INHALATION_SPRAY | Freq: Four times a day (QID) | RESPIRATORY_TRACT | Status: DC | PRN

## 2010-09-07 NOTE — Telephone Encounter (Signed)
Med sent as requested 

## 2010-10-19 ENCOUNTER — Telehealth: Payer: Self-pay | Admitting: Family Medicine

## 2010-10-19 NOTE — Telephone Encounter (Signed)
Advised no lantus kept at office. She is behind on payment at the mail order and they will not send her any. Gave meter and strips from office

## 2010-11-17 ENCOUNTER — Encounter: Payer: Self-pay | Admitting: Family Medicine

## 2010-11-19 ENCOUNTER — Ambulatory Visit (INDEPENDENT_AMBULATORY_CARE_PROVIDER_SITE_OTHER): Payer: Medicare HMO | Admitting: Family Medicine

## 2010-11-19 ENCOUNTER — Encounter: Payer: Self-pay | Admitting: Family Medicine

## 2010-11-19 VITALS — BP 148/90 | HR 100 | Resp 16 | Ht 62.5 in | Wt 227.0 lb

## 2010-11-19 DIAGNOSIS — E785 Hyperlipidemia, unspecified: Secondary | ICD-10-CM

## 2010-11-19 DIAGNOSIS — E119 Type 2 diabetes mellitus without complications: Secondary | ICD-10-CM

## 2010-11-19 DIAGNOSIS — G8929 Other chronic pain: Secondary | ICD-10-CM

## 2010-11-19 DIAGNOSIS — J370 Chronic laryngitis: Secondary | ICD-10-CM

## 2010-11-19 DIAGNOSIS — Z13220 Encounter for screening for lipoid disorders: Secondary | ICD-10-CM

## 2010-11-19 DIAGNOSIS — I10 Essential (primary) hypertension: Secondary | ICD-10-CM

## 2010-11-19 DIAGNOSIS — J45991 Cough variant asthma: Secondary | ICD-10-CM

## 2010-11-19 DIAGNOSIS — G4700 Insomnia, unspecified: Secondary | ICD-10-CM

## 2010-11-19 DIAGNOSIS — H9209 Otalgia, unspecified ear: Secondary | ICD-10-CM

## 2010-11-19 DIAGNOSIS — E669 Obesity, unspecified: Secondary | ICD-10-CM

## 2010-11-19 LAB — HEMOGLOBIN A1C: Hgb A1c MFr Bld: 7.3 % — ABNORMAL HIGH (ref <5.7)

## 2010-11-19 MED ORDER — PRAVASTATIN SODIUM 40 MG PO TABS: 40.0000 mg | ORAL_TABLET | Freq: Every day | ORAL | Status: DC

## 2010-11-19 MED ORDER — ALBUTEROL SULFATE HFA 108 (90 BASE) MCG/ACT IN AERS: 2.0000 | INHALATION_SPRAY | Freq: Four times a day (QID) | RESPIRATORY_TRACT | Status: DC | PRN

## 2010-11-19 MED ORDER — BAYER MICROLET LANCETS MISC: Status: DC

## 2010-11-19 MED ORDER — GLUCOSE BLOOD VI STRP: ORAL_STRIP | Status: DC

## 2010-11-19 MED ORDER — AMLODIPINE BESYLATE 10 MG PO TABS: 10.0000 mg | ORAL_TABLET | Freq: Every day | ORAL | Status: DC

## 2010-11-19 MED ORDER — METFORMIN HCL 1000 MG PO TABS: 1000.0000 mg | ORAL_TABLET | Freq: Two times a day (BID) | ORAL | Status: DC

## 2010-11-19 MED ORDER — TRIAMTERENE-HCTZ 37.5-25 MG PO CAPS: 1.0000 | ORAL_CAPSULE | Freq: Every day | ORAL | Status: DC

## 2010-11-19 MED ORDER — ZOLPIDEM TARTRATE 10 MG PO TABS: 10.0000 mg | ORAL_TABLET | Freq: Every evening | ORAL | Status: DC | PRN

## 2010-11-19 MED ORDER — GLIPIZIDE 10 MG PO TABS: 10.0000 mg | ORAL_TABLET | Freq: Two times a day (BID) | ORAL | Status: DC

## 2010-11-19 NOTE — Patient Instructions (Addendum)
F/U in 3.5 months  It is important that you exercise regularly at least 30 minutes 5 times a week. If you develop chest pain, have severe difficulty breathing, or feel very tired, stop exercising immediately and seek medical attention  A healthy diet is rich in fruit, vegetables and whole grains. Poultry fish, nuts and beans are a healthy choice for protein rather then red meat. A low sodium diet and drinking 64 ounces of water daily is generally recommended. Oils and sweet should be limited. Carbohydrates especially for those who are diabetic or overweight, should be limited to 34-45 gram per meal. It is important to eat on a regular schedule, at least 3 times daily. Snacks should be primarily fruits, vegetables or nuts.   hBA1C today.  Fasting lipid, cmp , egfr, hba1c  In 3 months.  LABWORK  NEEDS TO BE DONE BETWEEN 3 TO 7 DAYS BEFORE YOUR NEXT SCEDULED  VISIT.  THIS WILL IMPROVE THE QUALITY OF YOUR CARE.  Pls schedule class with hospital for diabetic education You will be referrde to ENT

## 2010-11-20 DIAGNOSIS — E782 Mixed hyperlipidemia: Secondary | ICD-10-CM | POA: Insufficient documentation

## 2010-11-20 NOTE — Assessment & Plan Note (Signed)
C/o chronic left ear pain with tickle in throat and hoarseness, requests ENT eval, same will be arranged

## 2010-11-20 NOTE — Assessment & Plan Note (Signed)
Medication compliance addressed. Commitment to regular exercise and healthy  food choices, with portion control discussed. DASH diet and low fat diet discussed and literature offered. Changes in medication made at this visit.  

## 2010-11-20 NOTE — Assessment & Plan Note (Signed)
Improved on current regime

## 2010-11-20 NOTE — Assessment & Plan Note (Signed)
Improved, closer attention to diet stressed, pt top attend a class also

## 2010-11-20 NOTE — Assessment & Plan Note (Signed)
Low fat diet discussed and encouraged, fasting labs prior to next visit 

## 2010-11-20 NOTE — Assessment & Plan Note (Signed)
Unchanged. Patient re-educated about  the importance of commitment to a  minimum of 150 minutes of exercise per week. The importance of healthy food choices with portion control discussed. Encouraged to start a food diary, count calories and to consider  joining a support group. Sample diet sheets offered. Goals set by the patient for the next several months.    

## 2010-11-20 NOTE — Progress Notes (Signed)
  Subjective:    Patient ID: Tiffany Jacobs, female    DOB: 1945/11/03, 65 y.o.   MRN: [id number]  HPI The PT is here for follow up and re-evaluation of chronic medical conditions, medication management and review of any available recent lab and radiology data.  Preventive health is updated, specifically  Cancer screening and Immunization.   Questions or concerns regarding consultations or procedures which the PT has had in the interim are  addressed. The PT denies any adverse reactions to current medications since the last visit.  Reports improvement in her blood sugars with medication compliance, however, still has c/o no testing supplies , which is a recurrent complaint.Unable to exercise without joint pain and fatigue. Plans to attend diabetic ed class. C/o chronic tickle in throat with cough, hoarseness and right ear pain, wants eval by eNT      Review of Systems See HPI Denies recent fever or chills. Denies sinus pressure, nasal congestion,  or sore throat. Denies chest congestion, productive cough or wheezing. Denies chest pains, palpitations and leg swelling Denies abdominal pain, nausea, vomiting,diarrhea or constipation.   Denies dysuria, frequency, hesitancy or incontinence. Chronic back and knee pain Denies headaches, seizures, numbness, or tingling. Denies uncontrolled  depression, anxiety or insomnia. Denies skin break down or rash.        Objective:   Physical Exam  Patient alert and oriented and in no cardiopulmonary distress.  HEENT: No facial asymmetry, EOMI, no sinus tenderness,  oropharynx pink and moist.  Neck supple no adenopathy.  Chest: Clear to auscultation bilaterally.  CVS: S1, S2 no murmurs, no S3.  ABD: Soft non tender. Bowel sounds normal.  Ext: No edema  MS: Decreased  ROM spine, shoulders, hips and knees.  Skin: Intact, no ulcerations or rash noted.  Psych: Good eye contact, normal affect. Memory intact not anxious or depressed  appearing.  CNS: CN 2-12 intact, power, tone and sensation normal throughout. Diabetic Foot Check:  Appearance - no lesions, ulcers or calluses Skin - no unusual pallor or redness Sensation - grossly intact to light touch Monofilament testing -  Right - Great toe, medial, central, lateral ball and posterior foot  Decreased  Left - Great toe, medial, central, lateral ball and posterior foot decreased Pulses Left - Dorsalis Pedis and Posterior Tibia normal Right - Dorsalis Pedis and Posterior Tibia normal        Assessment & Plan:

## 2010-11-22 ENCOUNTER — Telehealth: Payer: Self-pay | Admitting: *Deleted

## 2010-11-22 NOTE — Telephone Encounter (Signed)
Patient aware.

## 2010-11-22 NOTE — Telephone Encounter (Signed)
Message copied by Diamantina Monks on Mon Nov 22, 2010  8:33 AM ------      Message from: Syliva Overman MD E      Created: Sat Nov 20, 2010  9:03 AM       pls advise blood sugar greatly improved, keep up the good work, goal is 6.9 or less the next time. Remind her to attend class at the hospital also to start measuring her carbs

## 2010-11-25 ENCOUNTER — Ambulatory Visit (INDEPENDENT_AMBULATORY_CARE_PROVIDER_SITE_OTHER): Payer: Medicare HMO | Admitting: Otolaryngology

## 2010-11-25 ENCOUNTER — Encounter: Payer: Self-pay | Admitting: Family Medicine

## 2010-11-25 DIAGNOSIS — R490 Dysphonia: Secondary | ICD-10-CM

## 2010-11-25 DIAGNOSIS — R070 Pain in throat: Secondary | ICD-10-CM

## 2010-11-29 ENCOUNTER — Telehealth: Payer: Self-pay | Admitting: Family Medicine

## 2010-11-29 DIAGNOSIS — E119 Type 2 diabetes mellitus without complications: Secondary | ICD-10-CM

## 2010-11-29 MED ORDER — ACCU-CHEK SOFTCLIX LANCETS MISC: Status: AC

## 2010-11-29 MED ORDER — GLUCOSE BLOOD VI STRP: ORAL_STRIP | Status: AC

## 2010-11-29 NOTE — Telephone Encounter (Signed)
Sent correct one in as requested

## 2010-12-23 ENCOUNTER — Ambulatory Visit (INDEPENDENT_AMBULATORY_CARE_PROVIDER_SITE_OTHER): Payer: Medicare HMO | Admitting: Otolaryngology

## 2010-12-23 DIAGNOSIS — J343 Hypertrophy of nasal turbinates: Secondary | ICD-10-CM

## 2010-12-23 DIAGNOSIS — J330 Polyp of nasal cavity: Secondary | ICD-10-CM

## 2010-12-23 DIAGNOSIS — J370 Chronic laryngitis: Secondary | ICD-10-CM

## 2010-12-23 DIAGNOSIS — R490 Dysphonia: Secondary | ICD-10-CM

## 2010-12-23 DIAGNOSIS — J310 Chronic rhinitis: Secondary | ICD-10-CM

## 2011-01-13 ENCOUNTER — Ambulatory Visit (INDEPENDENT_AMBULATORY_CARE_PROVIDER_SITE_OTHER): Payer: Medicare HMO | Admitting: Otolaryngology

## 2011-01-13 DIAGNOSIS — J310 Chronic rhinitis: Secondary | ICD-10-CM

## 2011-01-13 DIAGNOSIS — J330 Polyp of nasal cavity: Secondary | ICD-10-CM

## 2011-01-31 ENCOUNTER — Telehealth: Payer: Self-pay | Admitting: Family Medicine

## 2011-01-31 DIAGNOSIS — E119 Type 2 diabetes mellitus without complications: Secondary | ICD-10-CM

## 2011-01-31 MED ORDER — METFORMIN HCL 1000 MG PO TABS: 1000.0000 mg | ORAL_TABLET | Freq: Two times a day (BID) | ORAL | Status: DC

## 2011-01-31 MED ORDER — GLIPIZIDE 10 MG PO TABS: 10.0000 mg | ORAL_TABLET | Freq: Two times a day (BID) | ORAL | Status: DC

## 2011-01-31 NOTE — Telephone Encounter (Signed)
Sent in

## 2011-02-01 ENCOUNTER — Other Ambulatory Visit: Payer: Self-pay

## 2011-02-01 DIAGNOSIS — E119 Type 2 diabetes mellitus without complications: Secondary | ICD-10-CM

## 2011-02-01 MED ORDER — GLIPIZIDE 10 MG PO TABS: 10.0000 mg | ORAL_TABLET | Freq: Two times a day (BID) | ORAL | Status: DC

## 2011-02-01 MED ORDER — METFORMIN HCL 1000 MG PO TABS: 1000.0000 mg | ORAL_TABLET | Freq: Two times a day (BID) | ORAL | Status: DC

## 2011-02-03 LAB — COMPLETE METABOLIC PANEL WITH GFR
ALT: 24 U/L (ref 0–35)
AST: 23 U/L (ref 0–37)
Albumin: 4.1 g/dL (ref 3.5–5.2)
BUN: 11 mg/dL (ref 6–23)
CO2: 28 mEq/L (ref 19–32)
Calcium: 9.3 mg/dL (ref 8.4–10.5)
Chloride: 103 mEq/L (ref 96–112)
Creat: 0.81 mg/dL (ref 0.50–1.10)
GFR, Est African American: 88 mL/min (ref >60)
Potassium: 4.5 mEq/L (ref 3.5–5.3)

## 2011-02-03 LAB — LIPID PANEL
Cholesterol: 127 mg/dL (ref 0–200)
LDL Cholesterol: 68 mg/dL (ref 0–99)
Triglycerides: 72 mg/dL (ref <150)
VLDL: 14 mg/dL (ref 0–40)

## 2011-02-09 ENCOUNTER — Encounter: Payer: Self-pay | Admitting: Family Medicine

## 2011-02-11 ENCOUNTER — Encounter: Payer: Self-pay | Admitting: Family Medicine

## 2011-02-11 ENCOUNTER — Ambulatory Visit (INDEPENDENT_AMBULATORY_CARE_PROVIDER_SITE_OTHER): Payer: Medicare HMO | Admitting: Family Medicine

## 2011-02-11 VITALS — BP 128/78 | HR 92 | Resp 18 | Ht 62.5 in | Wt 230.0 lb

## 2011-02-11 DIAGNOSIS — G4700 Insomnia, unspecified: Secondary | ICD-10-CM

## 2011-02-11 DIAGNOSIS — I10 Essential (primary) hypertension: Secondary | ICD-10-CM

## 2011-02-11 DIAGNOSIS — E119 Type 2 diabetes mellitus without complications: Secondary | ICD-10-CM

## 2011-02-11 DIAGNOSIS — E785 Hyperlipidemia, unspecified: Secondary | ICD-10-CM

## 2011-02-11 DIAGNOSIS — E669 Obesity, unspecified: Secondary | ICD-10-CM

## 2011-02-11 DIAGNOSIS — R5383 Other fatigue: Secondary | ICD-10-CM

## 2011-02-11 DIAGNOSIS — R5381 Other malaise: Secondary | ICD-10-CM

## 2011-02-11 MED ORDER — GLIPIZIDE 10 MG PO TABS: 10.0000 mg | ORAL_TABLET | Freq: Two times a day (BID) | ORAL | Status: DC

## 2011-02-11 MED ORDER — AMLODIPINE BESYLATE 10 MG PO TABS: 10.0000 mg | ORAL_TABLET | Freq: Every day | ORAL | Status: DC

## 2011-02-11 MED ORDER — METFORMIN HCL 1000 MG PO TABS: 1000.0000 mg | ORAL_TABLET | Freq: Two times a day (BID) | ORAL | Status: DC

## 2011-02-11 MED ORDER — PRAVASTATIN SODIUM 40 MG PO TABS: 40.0000 mg | ORAL_TABLET | Freq: Every day | ORAL | Status: DC

## 2011-02-11 MED ORDER — ZOLPIDEM TARTRATE 10 MG PO TABS: 10.0000 mg | ORAL_TABLET | Freq: Every evening | ORAL | Status: DC | PRN

## 2011-02-11 NOTE — Assessment & Plan Note (Signed)
Controlled, no change in medication Low fat diet discussed and encouraged 

## 2011-02-11 NOTE — Assessment & Plan Note (Signed)
Controlled, no change in medication  

## 2011-02-11 NOTE — Assessment & Plan Note (Signed)
Deteriorated. Patient re-educated about  the importance of commitment to a  minimum of 150 minutes of exercise per week. The importance of healthy food choices with portion control discussed. Encouraged to start a food diary, count calories and to consider  joining a support group. Sample diet sheets offered. Goals set by the patient for the next several months.    

## 2011-02-11 NOTE — Patient Instructions (Signed)
CPE April 17 or after. Call if you need me before. I am thankful the cough is better with help from ENT  Your blood sugar has worsened in the past 3 months, please be more diligent about diet so this will improve.  Cholesterol, liver, kidney function all excellent.   CBC, HBA1C and chem 7 mid April BEFORE visit  It is important that you exercise regularly at least 30 minutes 5 times a week. If you develop chest pain, have severe difficulty breathing, or feel very tired, stop exercising immediately and seek medical attention  A healthy diet is rich in fruit, vegetables and whole grains. Poultry fish, nuts and beans are a healthy choice for protein rather then red meat. A low sodium diet and drinking 64 ounces of water daily is generally recommended. Oils and sweet should be limited. Carbohydrates especially for those who are diabetic or overweight, should be limited to 34-45 gram per meal. It is important to eat on a regular schedule, at least 3 times daily. Snacks should be primarily fruits, vegetables or nuts. I suggest you call and attend another class to help with healthy diet and food choices

## 2011-02-11 NOTE — Progress Notes (Signed)
  Subjective:    Patient ID: Tiffany Jacobs, female    DOB: 1945/11/23, 65 y.o.   MRN: [id number]  HPI The PT is here for follow up and re-evaluation of chronic medical conditions, medication management and review of any available recent lab and radiology data.  Preventive health is updated, specifically  Cancer screening and Immunization.   Questions or concerns regarding consultations or procedures which the PT has had in the interim are  Addressed.States much better following ENT eval when dx of right maxillary sinus  Abnormality was  made and this treatment is addressing the chronic cough The PT denies any adverse reactions to current medications since the last visit.  Continues to have marked fl;uctuation in her blood sugars , primarily as her diet fluctuates , still working on stopping sweetened drinks, fasting bloo0d sugars this week have ranged between 130 to 180      Review of Systems See HPI Denies recent fever or chills. Denies sinus pressure, nasal congestion, ear pain or sore throat. Denies chest congestion, productive cough or wheezing. Denies chest pains, palpitations and leg swelling Denies abdominal pain, nausea, vomiting,diarrhea or constipation.   Denies dysuria, frequency, hesitancy or incontinence. Denies uncontrolled  joint pain, swelling and limitation in mobility. Denies headaches, seizures, numbness, or tingling. Denies depression, anxiety or insomnia. Denies skin break down or rash.        Objective:   Physical Exam Patient alert and oriented and in no cardiopulmonary distress.  HEENT: No facial asymmetry, EOMI, no sinus tenderness,  oropharynx pink and moist.  Neck supple no adenopathy.  Chest: Clear to auscultation bilaterally.  CVS: S1, S2 no murmurs, no S3.  ABD: Soft non tender. Bowel sounds normal.  Ext: No edema  MS: Adequate though reduced  ROM spine, shoulders, hips and knees.  Skin: Intact, no ulcerations or rash noted.  Psych:  Good eye contact, normal affect. Memory intact not anxious or depressed appearing.  CNS: CN 2-12 intact, power, tone and sensation normal throughout.        Assessment & Plan:

## 2011-02-11 NOTE — Assessment & Plan Note (Signed)
Deteriorated, no med change, and closer attention to diet

## 2011-03-17 ENCOUNTER — Ambulatory Visit (INDEPENDENT_AMBULATORY_CARE_PROVIDER_SITE_OTHER): Payer: Medicare Other | Admitting: Otolaryngology

## 2011-03-17 DIAGNOSIS — J310 Chronic rhinitis: Secondary | ICD-10-CM

## 2011-03-17 DIAGNOSIS — J330 Polyp of nasal cavity: Secondary | ICD-10-CM

## 2011-04-07 ENCOUNTER — Other Ambulatory Visit: Payer: Self-pay | Admitting: Family Medicine

## 2011-04-07 DIAGNOSIS — Z139 Encounter for screening, unspecified: Secondary | ICD-10-CM

## 2011-04-19 ENCOUNTER — Telehealth (HOSPITAL_COMMUNITY): Payer: Self-pay | Admitting: Dietician

## 2011-04-26 NOTE — Telephone Encounter (Signed)
Pt scheduled to attend diabetes class on 04/19/11 at 10:00 AM. Pt did not attend class.  

## 2011-05-19 ENCOUNTER — Ambulatory Visit (HOSPITAL_COMMUNITY)
Admission: RE | Admit: 2011-05-19 | Discharge: 2011-05-19 | Disposition: A | Discharge status: Home / Self Care | Payer: Medicare Other | Source: Ambulatory Visit | Attending: Family Medicine | Admitting: Family Medicine

## 2011-05-19 DIAGNOSIS — Z139 Encounter for screening, unspecified: Secondary | ICD-10-CM

## 2011-05-19 DIAGNOSIS — Z1231 Encounter for screening mammogram for malignant neoplasm of breast: Secondary | ICD-10-CM | POA: Insufficient documentation

## 2011-06-14 LAB — CBC WITH DIFFERENTIAL/PLATELET
Basophils Absolute: 0.1 10*3/uL (ref 0.0–0.1)
Basophils Relative: 1 % (ref 0–1)
Eosinophils Absolute: 0.2 10*3/uL (ref 0.0–0.7)
MCH: 26.9 pg (ref 26.0–34.0)
MCHC: 31.9 g/dL (ref 30.0–36.0)
Monocytes Absolute: 0.7 10*3/uL (ref 0.1–1.0)
Monocytes Relative: 9 % (ref 3–12)
Neutro Abs: 5 10*3/uL (ref 1.7–7.7)
Neutrophils Relative %: 64 % (ref 43–77)
RDW: 15.4 % (ref 11.5–15.5)

## 2011-06-14 LAB — HEMOGLOBIN A1C
Hgb A1c MFr Bld: 8 % — ABNORMAL HIGH (ref <5.7)
Mean Plasma Glucose: 183 mg/dL — ABNORMAL HIGH (ref <117)

## 2011-06-14 LAB — BASIC METABOLIC PANEL
BUN: 14 mg/dL (ref 6–23)
Creat: 0.77 mg/dL (ref 0.50–1.10)
Glucose, Bld: 154 mg/dL — ABNORMAL HIGH (ref 70–99)

## 2011-06-24 ENCOUNTER — Other Ambulatory Visit (HOSPITAL_COMMUNITY)
Admission: RE | Admit: 2011-06-24 | Discharge: 2011-06-24 | Disposition: A | Discharge status: Home / Self Care | Payer: Medicare Other | Source: Ambulatory Visit | Attending: Family Medicine | Admitting: Family Medicine

## 2011-06-24 ENCOUNTER — Ambulatory Visit (INDEPENDENT_AMBULATORY_CARE_PROVIDER_SITE_OTHER): Payer: Medicare Other | Admitting: Family Medicine

## 2011-06-24 ENCOUNTER — Encounter: Payer: Self-pay | Admitting: Family Medicine

## 2011-06-24 VITALS — BP 122/82 | HR 91 | Resp 16 | Ht 62.5 in | Wt 230.1 lb

## 2011-06-24 DIAGNOSIS — E119 Type 2 diabetes mellitus without complications: Secondary | ICD-10-CM

## 2011-06-24 DIAGNOSIS — E785 Hyperlipidemia, unspecified: Secondary | ICD-10-CM

## 2011-06-24 DIAGNOSIS — J45991 Cough variant asthma: Secondary | ICD-10-CM

## 2011-06-24 DIAGNOSIS — I10 Essential (primary) hypertension: Secondary | ICD-10-CM

## 2011-06-24 DIAGNOSIS — Z124 Encounter for screening for malignant neoplasm of cervix: Secondary | ICD-10-CM | POA: Insufficient documentation

## 2011-06-24 DIAGNOSIS — R5383 Other fatigue: Secondary | ICD-10-CM

## 2011-06-24 DIAGNOSIS — Z0000 Encounter for general adult medical examination without abnormal findings: Secondary | ICD-10-CM

## 2011-06-24 DIAGNOSIS — R5381 Other malaise: Secondary | ICD-10-CM

## 2011-06-24 DIAGNOSIS — Z1211 Encounter for screening for malignant neoplasm of colon: Secondary | ICD-10-CM

## 2011-06-24 LAB — POC HEMOCCULT BLD/STL (OFFICE/1-CARD/DIAGNOSTIC): Fecal Occult Blood, POC: NEGATIVE

## 2011-06-24 MED ORDER — SITAGLIPTIN PHOSPHATE 100 MG PO TABS: 100.0000 mg | ORAL_TABLET | Freq: Every day | ORAL | Status: DC

## 2011-06-24 MED ORDER — SITAGLIPTIN PHOSPHATE 50 MG PO TABS: 100.0000 mg | ORAL_TABLET | Freq: Every day | ORAL | Status: DC

## 2011-06-24 MED ORDER — PRAVASTATIN SODIUM 40 MG PO TABS: 40.0000 mg | ORAL_TABLET | Freq: Every day | ORAL | Status: DC

## 2011-06-24 NOTE — Assessment & Plan Note (Signed)
Controlled, no change in medication  

## 2011-06-24 NOTE — Progress Notes (Signed)
  Subjective:    Patient ID: Dickey Gave, female    DOB: 1946-02-16, 66 y.o.   MRN: [id number]  HPI The PT is here for annual exam  and re-evaluation of chronic medical conditions, medication management and review of any available recent lab and radiology data.  Preventive health is updated, specifically  Cancer screening and Immunization.   The PT denies any adverse reactions to current medications since the last visit.  C/o intermittent numbness in hands and lower extremities, denies weakness or blurred vision. Resistant to starting insulin although blood sugar control is worse    Review of Systems See HPI   Denies recent fever or chills. Denies sinus pressure, nasal congestion, ear pain or sore throat. Denies chest congestion, productive cough or wheezing. Denies chest pains, palpitations and leg swelling Denies abdominal pain, nausea, vomiting,diarrhea or constipation.   Denies dysuria, frequency, hesitancy or incontinence. Chronic  joint pain,  and limitation in mobility. Denies headaches, seizures,  or tingling. Denies depression, anxiety or insomnia. Denies skin break down or rash.        Objective:   Physical Exam  Pleasant well nourished female, alert and oriented x 3, in no cardio-pulmonary distress. Afebrile. HEENT No facial trauma or asymetry. Sinuses non tender.  EOMI, PERTL, fundoscopic exam is normal, no hemorhage or exudate.  External ears normal, tympanic membranes clear. Oropharynx moist, no exudate, poor  dentition. Neck: supple, no adenopathy,JVD or thyromegaly.No bruits.  Chest: Clear to ascultation bilaterally.No crackles or wheezes. Non tender to palpation  Breast: No asymetry,no masses. No nipple discharge or inversion. No axillary or supraclavicular adenopathy  Cardiovascular system; Heart sounds normal,  S1 and  S2 ,no S3.  No murmur, or thrill. Apical beat not displaced Peripheral pulses normal.  Abdomen: Soft, non tender, no  organomegaly or masses. No bruits. Bowel sounds normal. No guarding, tenderness or rebound.  Rectal:  No mass. Guaiac negative stool.  GU: External genitalia normal. No lesions. Vaginal canal normal.No discharge. Uterus normal size, no adnexal masses, no cervical motion or adnexal tenderness.  Musculoskeletal exam: Decreased  ROM of spine, hips , shoulders and knees. No deformity ,swelling or crepitus noted. No muscle wasting or atrophy.   Neurologic: Cranial nerves 2 to 12 intact. Power, tone ,sensation and reflexes normal throughout. No disturbance in gait. No tremor.  Skin: Intact, no ulceration, erythema , scaling or rash noted. Pigmentation normal throughout  Psych; Normal mood and affect. Judgement and concentration normal Diabetic Foot Check:  Appearance - no lesions, ulcers or calluses Skin - no unusual pallor or redness Sensation - grossly intact to light touch Monofilament testing -  Right - Great toe, medial, central, lateral ball and posterior foot diminished Left - Great toe, medial, central, lateral ball and posterior foot diminished Pulses Left - Dorsalis Pedis and Posterior Tibia normal Right - Dorsalis Pedis and Posterior Tibia normal       Assessment & Plan:

## 2011-06-24 NOTE — Assessment & Plan Note (Signed)
Deteriorated, refusing insulin, will add [place]

## 2011-06-24 NOTE — Assessment & Plan Note (Signed)
Uncontrolled, reports exertional dyspnea, requests handicap sticker, not using maintainance inhaler only rescue, cost of medication has been a recurrent problem

## 2011-06-24 NOTE — Patient Instructions (Addendum)
F/u in 3.5 month  Blood sugars are uncontrolled, I recommend strongly that you start long acting insulin in addition to your current medication.  You are requesting that you try another tablet,  I will write a script for januvia, for you to take in addition to the tablets you are already one, you will get a coupon to see if this helps also.  Continue to monitor the sporadic numbness you are experiencing, if it worsens you definitely should be evaluated by a neurologidst  Fasting cmp and egfr, lipidn HBA1C, tshb in 3.5 month

## 2011-07-14 ENCOUNTER — Ambulatory Visit (INDEPENDENT_AMBULATORY_CARE_PROVIDER_SITE_OTHER): Payer: Medicare Other | Admitting: Otolaryngology

## 2011-07-14 DIAGNOSIS — J310 Chronic rhinitis: Secondary | ICD-10-CM

## 2011-07-14 DIAGNOSIS — J343 Hypertrophy of nasal turbinates: Secondary | ICD-10-CM

## 2011-07-14 DIAGNOSIS — J330 Polyp of nasal cavity: Secondary | ICD-10-CM

## 2011-09-21 ENCOUNTER — Telehealth: Payer: Self-pay | Admitting: Family Medicine

## 2011-09-21 DIAGNOSIS — E119 Type 2 diabetes mellitus without complications: Secondary | ICD-10-CM

## 2011-09-21 MED ORDER — SITAGLIPTIN PHOSPHATE 100 MG PO TABS: 100.0000 mg | ORAL_TABLET | Freq: Every day | ORAL | Status: DC

## 2011-09-21 MED ORDER — METFORMIN HCL 1000 MG PO TABS: 1000.0000 mg | ORAL_TABLET | Freq: Two times a day (BID) | ORAL | Status: DC

## 2011-09-21 NOTE — Telephone Encounter (Signed)
Both diabetic meds sent in

## 2011-09-29 ENCOUNTER — Other Ambulatory Visit: Payer: Self-pay | Admitting: Family Medicine

## 2011-09-29 ENCOUNTER — Telehealth: Payer: Self-pay | Admitting: Family Medicine

## 2011-09-29 NOTE — Telephone Encounter (Signed)
Patient aware.

## 2011-10-05 ENCOUNTER — Other Ambulatory Visit: Payer: Self-pay | Admitting: Family Medicine

## 2011-10-05 ENCOUNTER — Telehealth: Payer: Self-pay

## 2011-10-05 NOTE — Telephone Encounter (Signed)
Advise this is not a common side effect, she can stop for 5 days see if irt makes a difference if it does then do not start back. Needs HBa1C end August end August before next visit, at that time I will address further treatment options

## 2011-10-06 NOTE — Telephone Encounter (Signed)
Pt aware and she will have labs drawn before next visit.

## 2011-10-11 LAB — COMPLETE METABOLIC PANEL WITH GFR
AST: 20 U/L (ref 0–37)
BUN: 12 mg/dL (ref 6–23)
Calcium: 9.7 mg/dL (ref 8.4–10.5)
Chloride: 100 mEq/L (ref 96–112)
Creat: 0.78 mg/dL (ref 0.50–1.10)
Total Bilirubin: 0.3 mg/dL (ref 0.3–1.2)

## 2011-10-11 LAB — LIPID PANEL
Cholesterol: 150 mg/dL (ref 0–200)
Triglycerides: 88 mg/dL (ref <150)
VLDL: 18 mg/dL (ref 0–40)

## 2011-10-11 LAB — TSH: TSH: 3.308 u[IU]/mL (ref 0.350–4.500)

## 2011-10-13 ENCOUNTER — Ambulatory Visit (INDEPENDENT_AMBULATORY_CARE_PROVIDER_SITE_OTHER): Payer: Medicare Other | Admitting: Otolaryngology

## 2011-10-28 ENCOUNTER — Ambulatory Visit: Payer: Medicare Other | Admitting: Family Medicine

## 2011-11-03 ENCOUNTER — Ambulatory Visit (INDEPENDENT_AMBULATORY_CARE_PROVIDER_SITE_OTHER): Payer: Medicare Other | Admitting: Otolaryngology

## 2011-11-03 DIAGNOSIS — J310 Chronic rhinitis: Secondary | ICD-10-CM

## 2011-11-03 DIAGNOSIS — J330 Polyp of nasal cavity: Secondary | ICD-10-CM

## 2011-11-04 ENCOUNTER — Other Ambulatory Visit: Payer: Self-pay

## 2011-11-04 ENCOUNTER — Ambulatory Visit (INDEPENDENT_AMBULATORY_CARE_PROVIDER_SITE_OTHER): Payer: Medicare Other | Admitting: Family Medicine

## 2011-11-04 ENCOUNTER — Encounter: Payer: Self-pay | Admitting: Family Medicine

## 2011-11-04 VITALS — BP 150/92 | HR 92 | Resp 16 | Ht 62.5 in | Wt 229.1 lb

## 2011-11-04 DIAGNOSIS — E119 Type 2 diabetes mellitus without complications: Secondary | ICD-10-CM

## 2011-11-04 DIAGNOSIS — E785 Hyperlipidemia, unspecified: Secondary | ICD-10-CM

## 2011-11-04 DIAGNOSIS — E669 Obesity, unspecified: Secondary | ICD-10-CM

## 2011-11-04 DIAGNOSIS — I10 Essential (primary) hypertension: Secondary | ICD-10-CM

## 2011-11-04 DIAGNOSIS — J45991 Cough variant asthma: Secondary | ICD-10-CM

## 2011-11-04 MED ORDER — SPIRONOLACTONE 25 MG PO TABS: 25.0000 mg | ORAL_TABLET | Freq: Every day | ORAL | Status: DC

## 2011-11-04 MED ORDER — INSULIN DETEMIR 100 UNIT/ML ~~LOC~~ SOLN: SUBCUTANEOUS | Status: DC

## 2011-11-04 MED ORDER — ZOLPIDEM TARTRATE 10 MG PO TABS: ORAL_TABLET | ORAL | Status: DC

## 2011-11-04 MED ORDER — GLIPIZIDE 10 MG PO TABS: 10.0000 mg | ORAL_TABLET | Freq: Two times a day (BID) | ORAL | Status: DC

## 2011-11-04 MED ORDER — AMLODIPINE BESYLATE 10 MG PO TABS: 10.0000 mg | ORAL_TABLET | Freq: Every day | ORAL | Status: DC

## 2011-11-04 NOTE — Patient Instructions (Addendum)
F/u in 6 weeks, bring meter and log.  Test twice daily, before breakfast and at bedtime and record.  New is levemir start at 7 units increase every 5 days as discussed.this is for your blood sugar  New is spironolactone , take one daily, for blood pressure, continue other blood pressure medication  You need the shingles vaccine, check your pharmacy, script provided  It is important that you exercise regularly at least 30 minutes 5 times a week. If you develop chest pain, have severe difficulty breathing, or feel very tired, stop exercising immediately and seek medical attention  A healthy diet is rich in fruit, vegetables and whole grains. Poultry fish, nuts and beans are a healthy choice for protein rather then red meat. A low sodium diet and drinking 64 ounces of water daily is generally recommended. Oils and sweet should be limited. Carbohydrates especially for those who are diabetic or overweight, should be limited to 34-45 gram per meal. It is important to eat on a regular schedule, at least 3 times daily. Snacks should be primarily fruits, vegetables or nuts.   Call with questions

## 2011-11-06 NOTE — Assessment & Plan Note (Signed)
Uncontrolled , add levemir  Patient advised to reduce carb and sweets, commit to regular physical activity, take meds as prescribed, test blood as directed, and attempt to lose weight, to improve blood sugar control. Marland Kitchen

## 2011-11-06 NOTE — Progress Notes (Signed)
  Subjective:    Patient ID: Tiffany Jacobs, female    DOB: December 15, 1945, 66 y.o.   MRN: [id number]  HPI The PT is here for follow up and re-evaluation of chronic medical conditions, medication management and review of any available recent lab and radiology data.  Preventive health is updated, specifically  Cancer screening and Immunization.   Questions or concerns regarding consultations or procedures which the PT has had in the interim are  addressed. The PT denies any adverse reactions to current medications since the last visit.  C/o inability to lose weight, however admits inconsistency in efforts to do so. Reports fasting sugars to be gewnrally 170 to 180, realizes and accepts she is uncontrolled and now willing to take insulin     Review of Systems See HPI Denies recent fever or chills. Denies sinus pressure, nasal congestion, ear pain or sore throat. Denies chest congestion, productive cough or wheezing. Denies chest pains, palpitations and leg swelling Denies abdominal pain, nausea, vomiting,diarrhea or constipation.   Denies dysuria, frequency, hesitancy or incontinence. Denies joint pain, swelling and limitation in mobility. Denies headaches, seizures, numbness, or tingling. Denies depression, anxiety or insomnia. Denies skin break down or rash.        Objective:   Physical Exam Patient alert and oriented and in no cardiopulmonary distress.  HEENT: No facial asymmetry, EOMI, no sinus tenderness,  oropharynx pink and moist.  Neck supple no adenopathy.  Chest: Clear to auscultation bilaterally.  CVS: S1, S2 no murmurs, no S3.  ABD: Soft non tender. Bowel sounds normal.  Ext: No edema  MS: Adequate ROM spine, shoulders, hips and knees.  Skin: Intact, no ulcerations or rash noted.  Psych: Good eye contact, normal affect. Memory intact not anxious or depressed appearing.  CNS: CN 2-12 intact, power, tone and sensation normal throughout.          Assessment & Plan:

## 2011-11-06 NOTE — Assessment & Plan Note (Signed)
Unchanged. Patient re-educated about  the importance of commitment to a  minimum of 150 minutes of exercise per week. The importance of healthy food choices with portion control discussed. Encouraged to start a food diary, count calories and to consider  joining a support group. Sample diet sheets offered. Goals set by the patient for the next several months.    

## 2011-11-06 NOTE — Assessment & Plan Note (Signed)
Uncontrolled, add spironolactone DASH diet and commitment to daily physical activity for a minimum of 30 minutes discussed and encouraged, as a part of hypertension management. The importance of attaining a healthy weight is also discussed.  

## 2011-11-06 NOTE — Assessment & Plan Note (Signed)
Controlled, low fat diet discussed and encouraged, ideally , would prefer LDL less than 70

## 2011-11-06 NOTE — Assessment & Plan Note (Signed)
Stable and controlled

## 2011-11-08 ENCOUNTER — Telehealth: Payer: Self-pay | Admitting: Family Medicine

## 2011-11-08 NOTE — Telephone Encounter (Signed)
Yes please call for samples, and let her know

## 2011-11-08 NOTE — Telephone Encounter (Signed)
The insulin was going to be $135 and she can't afford that. What are her options?

## 2011-11-08 NOTE — Telephone Encounter (Signed)
Its because they have to give her a whole box of pens (they can't split a box) so it is showing that she is getting a 75 day supply and insurance is charging her 3 copays. There is no way around that 3 copay charge with levemir or lantus. Want me to call for samples?

## 2011-11-08 NOTE — Telephone Encounter (Signed)
Ask the pharmacy if lantus more affordable for her, if not we can request levemir samples for her through pharma rep.

## 2011-11-09 NOTE — Telephone Encounter (Signed)
Patient aware and will come collect  

## 2011-11-09 NOTE — Telephone Encounter (Signed)
Called to let pt know but family member said she was not at home and hung up

## 2011-11-09 NOTE — Telephone Encounter (Signed)
Tiffany Jacobs for samples

## 2011-11-21 ENCOUNTER — Telehealth: Payer: Self-pay | Admitting: Family Medicine

## 2011-11-21 NOTE — Telephone Encounter (Signed)
Coming to bring samples today . Will call back for her to collect

## 2011-12-09 ENCOUNTER — Encounter: Payer: Self-pay | Admitting: Family Medicine

## 2011-12-09 ENCOUNTER — Ambulatory Visit (INDEPENDENT_AMBULATORY_CARE_PROVIDER_SITE_OTHER): Payer: Medicare Other | Admitting: Family Medicine

## 2011-12-09 VITALS — BP 130/72 | HR 106 | Resp 18 | Ht 62.5 in | Wt 227.1 lb

## 2011-12-09 DIAGNOSIS — R0602 Shortness of breath: Secondary | ICD-10-CM

## 2011-12-09 DIAGNOSIS — I10 Essential (primary) hypertension: Secondary | ICD-10-CM

## 2011-12-09 DIAGNOSIS — J45991 Cough variant asthma: Secondary | ICD-10-CM

## 2011-12-09 DIAGNOSIS — E119 Type 2 diabetes mellitus without complications: Secondary | ICD-10-CM

## 2011-12-09 DIAGNOSIS — E669 Obesity, unspecified: Secondary | ICD-10-CM

## 2011-12-09 NOTE — Progress Notes (Signed)
  Subjective:    Patient ID: Tiffany Jacobs, female    DOB: 1946-01-10, 66 y.o.   MRN: [id number]  HPI The PT is here for follow up and re-evaluation of chronic medical conditions, medication management and review of any available recent lab and radiology data.  Preventive health is updated, specifically  Cancer screening and Immunization.   Questions or concerns regarding consultations or procedures which the PT has had in the interim are  addressed. The PT denies any adverse reactions to current medications since the last visit.  She has her log book which shows fasting sugars to average 170, she continues to work on diet and physical activity. Obtaining supplies is also a challenge Denies polyuria, polydipsia or blurred vision      Review of Systems See HPI Denies recent fever or chills. Denies sinus pressure, nasal congestion, ear pain or sore throat. Denies chest congestion, productive cough or wheezing. Denies chest pains, palpitations and leg swelling Denies abdominal pain, nausea, vomiting,diarrhea or constipation.   Denies dysuria, frequency, hesitancy or incontinence. Chronic  joint pain, mild  limitation in mobility. Denies headaches, seizures, numbness, or tingling. Denies depression, anxiety or insomnia. Denies skin break down or rash.        Objective:   Physical Exam  Patient alert and oriented and in no cardiopulmonary distress.  HEENT: No facial asymmetry, EOMI, no sinus tenderness,  oropharynx pink and moist.  Neck supple no adenopathy.  Chest: Clear to auscultation bilaterally.  CVS: S1, S2 no murmurs, no S3.  ABD: Soft non tender. Bowel sounds normal.  Ext: No edema  MS: Adequate ROM spine, shoulders, hips and knees.  Skin: Intact, no ulcerations or rash noted.  Psych: Good eye contact, normal affect. Memory intact not anxious or depressed appearing.  CNS: CN 2-12 intact, power, tone and sensation normal throughout. Diabetic Foot Check:    Appearance - no lesions, ulcers or calluses Skin - no unusual pallor or redness Sensation - grossly intact to light touch Monofilament testing -  Right - Great toe, medial, central, lateral ball and posterior foot intact Left - Great toe, medial, central, lateral ball and posterior foot intact Pulses Left - Dorsalis Pedis and Posterior Tibia normal Right - Dorsalis Pedis and Posterior Tibia normal       Assessment & Plan:

## 2011-12-09 NOTE — Assessment & Plan Note (Signed)
Controlled, no change in medication DASH diet and commitment to daily physical activity for a minimum of 30 minutes discussed and encouraged, as a part of hypertension management. The importance of attaining a healthy weight is also discussed.  

## 2011-12-09 NOTE — Assessment & Plan Note (Signed)
Still a problem, and marked obesity and deconditioning are the likely factors

## 2011-12-09 NOTE — Patient Instructions (Addendum)
F/u forst week in December  Increase levemir to 25 units, CHANGE YOUR EATING, in Novemebr, if fasting sugar is still over 130, then increase to 30 units  Please reconsider the flu vaccine  Weight loss goal of 6 pounds  It is important that you exercise regularly at least 30 minutes 5 times a week. If you develop chest pain, have severe difficulty breathing, or feel very tired, stop exercising immediately and seek medical attention

## 2011-12-09 NOTE — Assessment & Plan Note (Signed)
unchanged Patient re-educated about  the importance of commitment to a  minimum of 150 minutes of exercise per week. The importance of healthy food choices with portion control discussed. Encouraged to start a food diary, count calories and to consider  joining a support group. Sample diet sheets offered. Goals set by the patient for the next several months.    

## 2011-12-09 NOTE — Assessment & Plan Note (Signed)
Controlled, no change in medication  

## 2011-12-09 NOTE — Assessment & Plan Note (Signed)
Fasting sugars are averaging 170, increase levemir to 25 units, will increase further to 30 units daily if needed

## 2011-12-22 ENCOUNTER — Telehealth: Payer: Self-pay | Admitting: Family Medicine

## 2011-12-22 NOTE — Telephone Encounter (Signed)
pls let pt know with the medical history that she does hav , this is insufficient for a Doctor's note to have her excused, so I will not write one

## 2011-12-22 NOTE — Telephone Encounter (Signed)
PATIENT AWARE

## 2011-12-30 ENCOUNTER — Other Ambulatory Visit: Payer: Self-pay

## 2011-12-30 ENCOUNTER — Telehealth: Payer: Self-pay | Admitting: Family Medicine

## 2011-12-30 MED ORDER — INSULIN PEN NEEDLE 32G X 4 MM MISC: 20.0000 [IU] | Status: DC

## 2011-12-30 NOTE — Telephone Encounter (Signed)
Spoke with patient and she is asking that needles for insulin pen be sent.

## 2012-01-06 ENCOUNTER — Other Ambulatory Visit: Payer: Self-pay | Admitting: Family Medicine

## 2012-01-18 ENCOUNTER — Other Ambulatory Visit: Payer: Self-pay

## 2012-01-18 LAB — HM DIABETES EYE EXAM: HM Diabetic Eye Exam: NORMAL

## 2012-01-18 MED ORDER — TRIAMTERENE-HCTZ 37.5-25 MG PO CAPS: 1.0000 | ORAL_CAPSULE | Freq: Every day | ORAL | Status: DC

## 2012-02-06 ENCOUNTER — Telehealth: Payer: Self-pay | Admitting: Family Medicine

## 2012-02-06 NOTE — Telephone Encounter (Signed)
pls try and get samples nmeeded, also refer to [employer]

## 2012-02-06 NOTE — Telephone Encounter (Signed)
Called and left message that I would call when samples came in

## 2012-02-06 NOTE — Telephone Encounter (Signed)
Wanting samples- can't afford insulin

## 2012-02-09 ENCOUNTER — Other Ambulatory Visit: Payer: Self-pay

## 2012-02-09 DIAGNOSIS — E119 Type 2 diabetes mellitus without complications: Secondary | ICD-10-CM

## 2012-02-09 MED ORDER — INSULIN DETEMIR 100 UNIT/ML ~~LOC~~ SOLN: SUBCUTANEOUS | Status: DC

## 2012-02-09 NOTE — Telephone Encounter (Signed)
Referred to Tiffany Jacobs also

## 2012-02-17 ENCOUNTER — Encounter: Payer: Self-pay | Admitting: Family Medicine

## 2012-02-17 ENCOUNTER — Ambulatory Visit (INDEPENDENT_AMBULATORY_CARE_PROVIDER_SITE_OTHER): Payer: Medicare Other | Admitting: Family Medicine

## 2012-02-17 VITALS — BP 116/74 | HR 110 | Resp 18 | Ht 62.5 in | Wt 228.0 lb

## 2012-02-17 DIAGNOSIS — L299 Pruritus, unspecified: Secondary | ICD-10-CM | POA: Insufficient documentation

## 2012-02-17 DIAGNOSIS — E785 Hyperlipidemia, unspecified: Secondary | ICD-10-CM

## 2012-02-17 DIAGNOSIS — E669 Obesity, unspecified: Secondary | ICD-10-CM

## 2012-02-17 DIAGNOSIS — E1065 Type 1 diabetes mellitus with hyperglycemia: Secondary | ICD-10-CM

## 2012-02-17 DIAGNOSIS — I10 Essential (primary) hypertension: Secondary | ICD-10-CM

## 2012-02-17 LAB — BASIC METABOLIC PANEL
BUN: 18 mg/dL (ref 6–23)
CO2: 29 mEq/L (ref 19–32)
Calcium: 10.1 mg/dL (ref 8.4–10.5)
Creat: 0.93 mg/dL (ref 0.50–1.10)

## 2012-02-17 MED ORDER — HYDROXYZINE HCL 25 MG PO TABS: ORAL_TABLET | ORAL | Status: DC

## 2012-02-17 NOTE — Progress Notes (Signed)
  Subjective:    Patient ID: Tiffany Jacobs, female    DOB: 1945-07-05, 66 y.o.   MRN: [id number]  HPI The PT is here for follow up and re-evaluation of chronic medical conditions, medication management and review of any available recent lab and radiology data.  Preventive health is updated, specifically  Cancer screening and Immunization.   Questions or concerns regarding consultations or procedures which the PT has had in the interim are  addressed. The PT denies any adverse reactions to current medications since the last visit.  There are no new concerns.  C/o inability to afford medication, levemir, hence blood sugars remain uncontrolled C/o itching on the feet at night wants med for this , no rash noted in area of concern, thought that spironolactone was the  medication for this, I advised her not the case       Review of Systems See HPI Denies recent fever or chills. Denies sinus pressure, nasal congestion, ear pain or sore throat. Denies chest congestion, productive cough or wheezing. Denies chest pains, palpitations and leg swelling Denies abdominal pain, nausea, vomiting,diarrhea or constipation.   Denies dysuria, frequency, hesitancy or incontinence. Chronic  joint pain, and mild  limitation in mobility. Denies headaches, seizures, numbness, or tingling. Denies depression,c/o increased stress, her spouse is ill Denies skin break down or rash.        Objective:   Physical Exam  Patient alert and oriented and in no cardiopulmonary distress.  HEENT: No facial asymmetry, EOMI, no sinus tenderness,  oropharynx pink and moist.  Neck supple no adenopathy.  Chest: Clear to auscultation bilaterally.  CVS: S1, S2 no murmurs, no S3.  ABD: Soft non tender. Bowel sounds normal.  Ext: No edema  MS: Adequate ROM spine, shoulders, hips and knees.  Skin: Intact, no ulcerations or rash noted.  Psych: Good eye contact, normal affect. Memory intact not anxious or  depressed appearing.  CNS: CN 2-12 intact, power, tone and sensation normal throughout. Diabetic Foot Check:  Appearance - no lesions, ulcers or calluses Skin - no unusual pallor or redness Sensation - grossly intact to light touch Monofilament testing -  Right - Great toe, medial, central, lateral ball and posterior foot intact Left - Great toe, medial, central, lateral ball and posterior foot intact Pulses Left - Dorsalis Pedis and Posterior Tibia normal Right - Dorsalis Pedis and Posterior Tibia normal       Assessment & Plan:

## 2012-02-17 NOTE — Assessment & Plan Note (Signed)
Unchanged. Patient re-educated about  the importance of commitment to a  minimum of 150 minutes of exercise per week. The importance of healthy food choices with portion control discussed. Encouraged to start a food diary, count calories and to consider  joining a support group. Sample diet sheets offered. Goals set by the patient for the next several months.    

## 2012-02-17 NOTE — Assessment & Plan Note (Signed)
Hyperlipidemia:Low fat diet discussed and encouraged.  Controlled, no change in medication rept lab in 3 to 4 monht

## 2012-02-17 NOTE — Patient Instructions (Addendum)
F/u in 3.5 month  Blood pressure and cholesterol are excellent, no med change  PLEASE GO TO SEE ABOUT YOUR iNSULIN, you need this  Fasting lipid, cmp and EGFR, hBA1C in 4 month, before next visit  hBA1C and chem 7 today, also microalb to be sent  It is important that you exercise regularly at least 30 minutes 5 times a week. If you develop chest pain, have severe difficulty breathing, or feel very tired, stop exercising immediately and seek medical attention   A healthy diet is rich in fruit, vegetables and whole grains. Poultry fish, nuts and beans are a healthy choice for protein rather then red meat. A low sodium diet and drinking 64 ounces of water daily is generally recommended. Oils and sweet should be limited. Carbohydrates especially for those who are diabetic or overweight, should be limited to 30-45 gram per meal. It is important to eat on a regular schedule, at least 3 times daily. Snacks should be primarily fruits, vegetables or nuts.

## 2012-02-17 NOTE — Assessment & Plan Note (Addendum)
Uncontrolled primarily due to medication non compliance, pt to seek med through health dept, she is to physically go, we have completed forms multiple times Patient educated about the importance of limiting  Carbohydrate intake , the need to commit to daily physical activity for a minimum of 30 minutes , and to commit weight loss. The fact that changes in all these areas will reduce or eliminate all together the development of diabetes is stressed.   Lab today

## 2012-02-17 NOTE — Assessment & Plan Note (Signed)
Nocturnal itch in the absence of rash, med prescribed

## 2012-02-17 NOTE — Assessment & Plan Note (Signed)
Controlled, no change in medication DASH diet and commitment to daily physical activity for a minimum of 30 minutes discussed and encouraged, as a part of hypertension management. The importance of attaining a healthy weight is also discussed.  

## 2012-02-18 LAB — MICROALBUMIN / CREATININE URINE RATIO: Microalb Creat Ratio: 3.3 mg/g (ref 0.0–30.0)

## 2012-02-21 ENCOUNTER — Telehealth: Payer: Self-pay | Admitting: Family Medicine

## 2012-02-21 DIAGNOSIS — I10 Essential (primary) hypertension: Secondary | ICD-10-CM

## 2012-02-21 MED ORDER — SPIRONOLACTONE 25 MG PO TABS: 25.0000 mg | ORAL_TABLET | Freq: Every day | ORAL | Status: DC

## 2012-02-21 NOTE — Telephone Encounter (Signed)
Sent in

## 2012-03-29 ENCOUNTER — Encounter (HOSPITAL_COMMUNITY): Payer: Self-pay | Admitting: Emergency Medicine

## 2012-03-29 ENCOUNTER — Emergency Department (HOSPITAL_COMMUNITY): Payer: Medicare Other

## 2012-03-29 ENCOUNTER — Emergency Department (HOSPITAL_COMMUNITY)
Admission: EM | Admit: 2012-03-29 | Discharge: 2012-03-30 | Disposition: A | Discharge status: Home / Self Care | Payer: Medicare Other | Attending: Emergency Medicine | Admitting: Emergency Medicine

## 2012-03-29 DIAGNOSIS — S7011XA Contusion of right thigh, initial encounter: Secondary | ICD-10-CM

## 2012-03-29 DIAGNOSIS — Z8739 Personal history of other diseases of the musculoskeletal system and connective tissue: Secondary | ICD-10-CM | POA: Insufficient documentation

## 2012-03-29 DIAGNOSIS — S20229A Contusion of unspecified back wall of thorax, initial encounter: Secondary | ICD-10-CM

## 2012-03-29 DIAGNOSIS — W1809XA Striking against other object with subsequent fall, initial encounter: Secondary | ICD-10-CM | POA: Insufficient documentation

## 2012-03-29 DIAGNOSIS — S8990XA Unspecified injury of unspecified lower leg, initial encounter: Secondary | ICD-10-CM | POA: Insufficient documentation

## 2012-03-29 DIAGNOSIS — Y92410 Unspecified street and highway as the place of occurrence of the external cause: Secondary | ICD-10-CM | POA: Insufficient documentation

## 2012-03-29 DIAGNOSIS — S40029A Contusion of unspecified upper arm, initial encounter: Secondary | ICD-10-CM | POA: Insufficient documentation

## 2012-03-29 DIAGNOSIS — E669 Obesity, unspecified: Secondary | ICD-10-CM | POA: Insufficient documentation

## 2012-03-29 DIAGNOSIS — Z87891 Personal history of nicotine dependence: Secondary | ICD-10-CM | POA: Insufficient documentation

## 2012-03-29 DIAGNOSIS — W1789XA Other fall from one level to another, initial encounter: Secondary | ICD-10-CM | POA: Insufficient documentation

## 2012-03-29 DIAGNOSIS — S40021A Contusion of right upper arm, initial encounter: Secondary | ICD-10-CM

## 2012-03-29 DIAGNOSIS — J45909 Unspecified asthma, uncomplicated: Secondary | ICD-10-CM | POA: Insufficient documentation

## 2012-03-29 DIAGNOSIS — E119 Type 2 diabetes mellitus without complications: Secondary | ICD-10-CM | POA: Insufficient documentation

## 2012-03-29 DIAGNOSIS — W19XXXA Unspecified fall, initial encounter: Secondary | ICD-10-CM

## 2012-03-29 DIAGNOSIS — S7010XA Contusion of unspecified thigh, initial encounter: Secondary | ICD-10-CM | POA: Insufficient documentation

## 2012-03-29 DIAGNOSIS — S40019A Contusion of unspecified shoulder, initial encounter: Secondary | ICD-10-CM | POA: Insufficient documentation

## 2012-03-29 DIAGNOSIS — Y9389 Activity, other specified: Secondary | ICD-10-CM | POA: Insufficient documentation

## 2012-03-29 DIAGNOSIS — M5144 Schmorl's nodes, thoracic region: Secondary | ICD-10-CM | POA: Insufficient documentation

## 2012-03-29 DIAGNOSIS — Z794 Long term (current) use of insulin: Secondary | ICD-10-CM | POA: Insufficient documentation

## 2012-03-29 DIAGNOSIS — I10 Essential (primary) hypertension: Secondary | ICD-10-CM | POA: Insufficient documentation

## 2012-03-29 DIAGNOSIS — Z79899 Other long term (current) drug therapy: Secondary | ICD-10-CM | POA: Insufficient documentation

## 2012-03-29 DIAGNOSIS — Z981 Arthrodesis status: Secondary | ICD-10-CM | POA: Insufficient documentation

## 2012-03-29 DIAGNOSIS — Z8709 Personal history of other diseases of the respiratory system: Secondary | ICD-10-CM | POA: Insufficient documentation

## 2012-03-29 DIAGNOSIS — Z7982 Long term (current) use of aspirin: Secondary | ICD-10-CM | POA: Insufficient documentation

## 2012-03-29 MED ORDER — OXYCODONE-ACETAMINOPHEN 5-325 MG PO TABS: 1.0000 | ORAL_TABLET | ORAL | Status: DC | PRN

## 2012-03-29 MED ORDER — OXYCODONE-ACETAMINOPHEN 5-325 MG PO TABS
1.0000 | ORAL_TABLET | Freq: Once | ORAL | Status: AC
  Administered 2012-03-29: 1 via ORAL
  Filled 2012-03-29: qty 1

## 2012-03-29 NOTE — ED Notes (Signed)
Pt states she fell when getting out of [employer] today. Pt c/o rt leg, arm and buttocks pain since fall. Pt states she landed on concrete.

## 2012-03-29 NOTE — ED Provider Notes (Signed)
History   This chart was scribed for Dione Booze, MD by Sofie Rower, ED Scribe. The patient was seen in room APA02/APA02 and the patient's care was started at 10:01PM.     CSN: [id number]  Arrival date & time 03/29/12  2023   First MD Initiated Contact with Patient 03/29/12 2201      Chief Complaint  Patient presents with  . Fall  . Leg Pain  . Arm Pain    (Consider location/radiation/quality/duration/timing/severity/associated sxs/prior treatment) Patient is a 67 y.o. female presenting with fall, leg pain, and arm pain. The history is provided by the patient. No language interpreter was used.  Fall The accident occurred 6 to 12 hours ago. The fall occurred while standing (While exiting her motor vehicle [employer]).). She fell from an unknown height. She landed on concrete. There was no blood loss. The point of impact was the right elbow, right shoulder, right knee and right hip (right lower extremity). The pain is present in the right elbow, right shoulder, right knee and right hip (Right lower extremity). The pain is at a severity of 7/10. The pain is moderate. She was ambulatory at the scene. There was no entrapment after the fall. There was no drug use involved in the accident. There was no alcohol use involved in the accident. Pertinent negatives include no loss of consciousness. The symptoms are aggravated by pressure on the injury and use of the injured limb. She has tried nothing for the symptoms. The treatment provided no relief.  Leg Pain  The incident occurred 6 to 12 hours ago. The incident occurred in the street. The injury mechanism was a fall. The pain is present in the right hip, right thigh, right leg and right knee. The quality of the pain is described as aching. The pain is moderate. The pain has been constant since onset. She reports no foreign bodies present. The symptoms are aggravated by activity. She has tried nothing for the symptoms. The treatment provided no relief.  Arm  Pain This is a new problem. The current episode started 6 to 12 hours ago. The problem occurs constantly. The problem has been gradually worsening. The symptoms are aggravated by twisting and bending. Nothing relieves the symptoms. She has tried nothing for the symptoms. The treatment provided no relief.    Tiffany Jacobs is a 67 y.o. female , with a hx of hypertension, diabetes, and lower back pain, who presents to the Emergency Department complaining of sudden, moderate, fall, onset today (03/29/12).  Associated symptoms include right lower extremity pain, right upper extremity pain, and back pain located at the lumbar region. The pt reports she fell while exiting her motor vehicle earlier this afternoon. The pt informs she impacted directly upon her right side at the time of the fall. The pt rates her right sided pain at a 7/10 at present. The pt has taken ibuprofen PTA which provides moderate relief of the right sided pain. Modifying factors include certain movements and positions of the right upper and right lower extremities which intensifies the right sided pain.   The pt denies any LOC experienced during the fall.   The pt does not smoke or drink alcohol.   PCP is Dr. Lodema Hong.    Past Medical History  Diagnosis Date  . Hypertension   . Diabetes mellitus, type 2   . Sciatica   . Low back pain   . Obesity   . Seasonal allergies   . Asthma   . RAD (  reactive airway disease)     Past Surgical History  Procedure Date  . Total knee arthroplasty left 02-01-05    Dr. Romeo Apple  . Tubal ligation 1970  . Left breast biopsy for benign disease     Family History  Problem Relation Age of Onset  . Kidney failure Father   . Mental illness Brother   . Diabetes Brother   . Hypertension Brother     History  Substance Use Topics  . Smoking status: Former Games developer  . Smokeless tobacco: Not on file  . Alcohol Use: No    OB History    Grav Para Term Preterm Abortions TAB SAB Ect  Mult Living                  Review of Systems  Musculoskeletal: Positive for back pain and arthralgias.  Neurological: Negative for loss of consciousness and syncope.  All other systems reviewed and are negative.    Allergies  Ace inhibitors  Home Medications   Current Outpatient Rx  Name  Route  Sig  Dispense  Refill  . ALBUTEROL SULFATE HFA 108 (90 BASE) MCG/ACT IN AERS   Inhalation   Inhale 2 puffs into the lungs every 6 (six) hours as needed for wheezing.   18 g   3     pls dispense preferred medication, per the insuran ...   . AMLODIPINE BESYLATE 10 MG PO TABS   Oral   Take 1 tablet (10 mg total) by mouth daily.   30 tablet   5   . ASPIRIN 81 MG PO TBEC   Oral   Take 81 mg by mouth every morning.          Marland Kitchen GLIPIZIDE 10 MG PO TABS   Oral   Take 1 tablet (10 mg total) by mouth 2 (two) times daily before a meal.   60 tablet   5   . HYDROXYZINE HCL 25 MG PO TABS   Oral   Take 25 mg by mouth at bedtime as needed. One tablet at night for itching         . INSULIN DETEMIR 100 UNIT/ML [place] SOLN   Subcutaneous   Inject 25 Units into the skin every morning.         Marland Kitchen METFORMIN HCL 1000 MG PO TABS   Oral   Take 1 tablet (1,000 mg total) by mouth 2 (two) times daily with a meal.   60 tablet   5   . PRAVASTATIN SODIUM 40 MG PO TABS   Oral   Take 1 tablet (40 mg total) by mouth at bedtime.   30 tablet   5   . ZOLPIDEM TARTRATE 10 MG PO TABS   Oral   Take 10 mg by mouth at bedtime as needed. For sleep         . IPRATROPIUM-ALBUTEROL 0.5-2.5 (3) MG/3ML IN SOLN   Nebulization   Take 3 mLs by nebulization. Inhale 1 vial thru nebulizer every 6-8 hours as needed for wheezing            BP 128/49  Pulse 96  Temp 98.4 F (36.9 C) (Oral)  Resp 18  Ht 5' 2.5" (1.588 m)  Wt 227 lb (102.967 kg)  BMI 40.86 kg/m2  SpO2 100%  Physical Exam  Nursing note and vitals reviewed. Constitutional: She is oriented to person, place, and time. She appears  well-developed and well-nourished. No distress.  HENT:  Head: Normocephalic and atraumatic.  Eyes:  EOM are normal. Pupils are equal, round, and reactive to light.  Neck: Neck supple. No tracheal deviation present.  Cardiovascular: Normal rate, regular rhythm and normal heart sounds.   Pulmonary/Chest: Effort normal and breath sounds normal. No respiratory distress.  Abdominal: Soft. Bowel sounds are normal. She exhibits no distension.  Musculoskeletal: Normal range of motion. She exhibits edema (1 + edema detected).       Lumbar back: She exhibits tenderness.       Moderate tenderness proximal right humerus. No tend over clavicle or humoral head. Full passive ROM. Distal/neurovascularly intact. Mild tenderness of lower lumbar spine. Mild tenderness of right femor. Full Passive ROM.   Neurological: She is alert and oriented to person, place, and time. No sensory deficit.  Skin: Skin is warm and dry.  Psychiatric: She has a normal mood and affect. Her behavior is normal.    ED Course  Procedures (including critical care time)  DIAGNOSTIC STUDIES: Oxygen Saturation is 100% on room air, normal by my interpretation.    COORDINATION OF CARE:   10:06 PM- Treatment plan discussed with patient. Pt agrees with treatment.     Dg Lumbar Spine Complete  03/29/2012  *RADIOLOGY REPORT*  Clinical Data: Status post fall on concrete; lower back pain.  LUMBAR SPINE - COMPLETE 4+ VIEW  Comparison: Lumbar spine radiographs performed 01/04/2005  Findings: There is no evidence of acute fracture or subluxation. There is chronic grade 2 anterolisthesis of L5 on S1; this appears slightly worsened from 2006, with associated chronic intervertebral disc space narrowing at L5-S1.  Vertebral bodies otherwise demonstrate normal height and alignment.  The visualized bowel gas pattern is unremarkable in appearance; air and stool are noted within the colon.  Diffuse sclerotic change is noted along the sacroiliac joints  bilaterally.  IMPRESSION:  1.  No evidence of acute fracture or subluxation along the lumbar spine. 2.  Chronic grade 2 anterolisthesis of L5 on S1 appears slightly worsened from 2006, with associated degenerative change.   Original Report Authenticated By: Tonia Ghent, M.D.    Dg Femur Right  03/29/2012  *RADIOLOGY REPORT*  Clinical Data: Status post fall on concrete; right thigh pain.  RIGHT FEMUR - 2 VIEW  Comparison: None.  Findings: There is no evidence of fracture or dislocation.  The right femur appears intact.  There is degenerative change at the right knee, with significant medial compartment narrowing and vacuum phenomenon.  Prominent marginal osteophytes are noted arising particularly at the medial and patellofemoral compartments.  No significant knee joint effusion is identified.  No definite soft tissue abnormalities are characterized on radiograph.  IMPRESSION:  1.  No evidence of fracture or dislocation. 2.  Osteoarthritis at the right knee, with significant joint space narrowing at the medial compartment, and associated vacuum phenomenon.   Original Report Authenticated By: Tonia Ghent, M.D.    Dg Humerus Right  03/29/2012  *RADIOLOGY REPORT*  Clinical Data: Status post fall on concrete, with distal right arm pain.  RIGHT HUMERUS - 2+ VIEW  Comparison: None.  Findings: There is no evidence of fracture or dislocation.  The right humerus appears intact.  The right humeral head remains seated at the glenoid fossa.  Mild degenerative change is noted at the right acromioclavicular joint, with inferior osteophyte formation.  Mild subcortical cysts are seen at the glenoid.  The elbow joint is incompletely assessed, but appears grossly unremarkable.  No significant soft tissue abnormalities are characterized on radiograph.  IMPRESSION:  1.  No evidence of fracture  or dislocation. 2.  Mild degenerative change at the right acromioclavicular joint, with inferior osteophyte formation; mild subcortical  cysts at the glenoid.   Original Report Authenticated By: Tonia Ghent, M.D.       1. Fall   2. Contusion, back   3. Contusion of right upper arm   4. Contusion of right thigh       MDM  Fall with injury to the right arm, right leg, back. I do not see evidence of serious injury, but x-rays will be obtained.  X-rays are negative for fracture. She was given a dose of Percocet with good relief of pain. She is discharged with prescription for Percocet but is advised to use over-the-counter analgesics as needed for pain not requiring Percocet.  I personally performed the services described in this documentation, which was scribed in my presence. The recorded information has been reviewed and is accurate.      Dione Booze, MD 03/29/12 [phone]

## 2012-03-29 NOTE — ED Notes (Signed)
Locates pain mid humerus area on right and right hip/ thigh area.   Took total of 800 mg of Ibuprofen after fall and has been using ice to areas of discomfort

## 2012-04-01 ENCOUNTER — Encounter (HOSPITAL_COMMUNITY): Payer: Self-pay | Admitting: *Deleted

## 2012-04-01 ENCOUNTER — Emergency Department (HOSPITAL_COMMUNITY)
Admission: EM | Admit: 2012-04-01 | Discharge: 2012-04-01 | Disposition: A | Discharge status: Home / Self Care | Payer: Medicare Other | Attending: Emergency Medicine | Admitting: Emergency Medicine

## 2012-04-01 DIAGNOSIS — Z7982 Long term (current) use of aspirin: Secondary | ICD-10-CM | POA: Insufficient documentation

## 2012-04-01 DIAGNOSIS — M25511 Pain in right shoulder: Secondary | ICD-10-CM

## 2012-04-01 DIAGNOSIS — Z87891 Personal history of nicotine dependence: Secondary | ICD-10-CM | POA: Insufficient documentation

## 2012-04-01 DIAGNOSIS — E669 Obesity, unspecified: Secondary | ICD-10-CM | POA: Insufficient documentation

## 2012-04-01 DIAGNOSIS — Z79899 Other long term (current) drug therapy: Secondary | ICD-10-CM | POA: Insufficient documentation

## 2012-04-01 DIAGNOSIS — E119 Type 2 diabetes mellitus without complications: Secondary | ICD-10-CM | POA: Insufficient documentation

## 2012-04-01 DIAGNOSIS — M25519 Pain in unspecified shoulder: Secondary | ICD-10-CM | POA: Insufficient documentation

## 2012-04-01 DIAGNOSIS — Z8739 Personal history of other diseases of the musculoskeletal system and connective tissue: Secondary | ICD-10-CM | POA: Insufficient documentation

## 2012-04-01 DIAGNOSIS — M5430 Sciatica, unspecified side: Secondary | ICD-10-CM | POA: Insufficient documentation

## 2012-04-01 DIAGNOSIS — Z794 Long term (current) use of insulin: Secondary | ICD-10-CM | POA: Insufficient documentation

## 2012-04-01 DIAGNOSIS — J45909 Unspecified asthma, uncomplicated: Secondary | ICD-10-CM | POA: Insufficient documentation

## 2012-04-01 DIAGNOSIS — I10 Essential (primary) hypertension: Secondary | ICD-10-CM | POA: Insufficient documentation

## 2012-04-01 MED ORDER — OXYCODONE-ACETAMINOPHEN 5-325 MG PO TABS: 1.0000 | ORAL_TABLET | ORAL | Status: AC | PRN

## 2012-04-01 MED ORDER — OXYCODONE-ACETAMINOPHEN 5-325 MG PO TABS
1.0000 | ORAL_TABLET | Freq: Once | ORAL | Status: AC
  Administered 2012-04-01: 1 via ORAL
  Filled 2012-04-01: qty 1

## 2012-04-01 NOTE — ED Notes (Signed)
Pt c/o continued  pain to right upper arm.

## 2012-04-01 NOTE — ED Provider Notes (Signed)
History     CSN: [id number]  Arrival date & time 04/01/12  1932   First MD Initiated Contact with Patient 04/01/12 2011      Chief Complaint  Patient presents with  . Arm Pain    (Consider location/radiation/quality/duration/timing/severity/associated sxs/prior treatment) HPI Comments: Patient c/o continues to have right shoulder pain for several days after a fall.  States she fell while exiting her her vehicle three days ago and was seen here at the time of the injury.  Had x-ray and given percocet pre-pack for the pain.  States the pain is now worse and she is having to use her left hand to raise her right arm.  States the pain is worse with movement and improves with rest.  She denies numbness, weakness or distal pain.    Patient is a 67 y.o. female presenting with shoulder injury. The history is provided by the patient.  Shoulder Injury This is a new problem. The current episode started in the past 7 days. The problem occurs constantly. The problem has been gradually worsening. Associated symptoms include arthralgias. Pertinent negatives include no abdominal pain, change in bowel habit, chest pain, chills, congestion, coughing, diaphoresis, fever, headaches, joint swelling, nausea, neck pain, numbness, sore throat, swollen glands, vomiting or weakness. Exacerbated by: movement and palpation. She has tried nothing for the symptoms. The treatment provided no relief.    Past Medical History  Diagnosis Date  . Hypertension   . Diabetes mellitus, type 2   . Sciatica   . Low back pain   . Obesity   . Seasonal allergies   . Asthma   . RAD (reactive airway disease)     Past Surgical History  Procedure Date  . Total knee arthroplasty left 02-01-05    Dr. Romeo Apple  . Tubal ligation 1970  . Left breast biopsy for benign disease     Family History  Problem Relation Age of Onset  . Kidney failure Father   . Mental illness Brother   . Diabetes Brother   . Hypertension Brother      History  Substance Use Topics  . Smoking status: Former Games developer  . Smokeless tobacco: Not on file  . Alcohol Use: No    OB History    Grav Para Term Preterm Abortions TAB SAB Ect Mult Living                  Review of Systems  Constitutional: Negative for fever, chills, diaphoresis, activity change and appetite change.  HENT: Negative for congestion, sore throat and neck pain.   Eyes: Negative for visual disturbance.  Respiratory: Negative for cough.   Cardiovascular: Negative for chest pain.  Gastrointestinal: Negative for nausea, vomiting, abdominal pain and change in bowel habit.  Musculoskeletal: Positive for arthralgias. Negative for back pain, joint swelling and gait problem.  Skin: Negative for color change and wound.  Neurological: Negative for weakness, numbness and headaches.  All other systems reviewed and are negative.    Allergies  Ace inhibitors  Home Medications   Current Outpatient Rx  Name  Route  Sig  Dispense  Refill  . ALBUTEROL SULFATE HFA 108 (90 BASE) MCG/ACT IN AERS   Inhalation   Inhale 2 puffs into the lungs every 6 (six) hours as needed for wheezing.   18 g   3     pls dispense preferred medication, per the insuran ...   . AMLODIPINE BESYLATE 10 MG PO TABS   Oral   Take 1  tablet (10 mg total) by mouth daily.   30 tablet   5   . ASPIRIN 81 MG PO TBEC   Oral   Take 81 mg by mouth every morning.          Marland Kitchen GLIPIZIDE 10 MG PO TABS   Oral   Take 1 tablet (10 mg total) by mouth 2 (two) times daily before a meal.   60 tablet   5   . HYDROXYZINE HCL 25 MG PO TABS   Oral   Take 25 mg by mouth at bedtime as needed. One tablet at night for itching         . INSULIN DETEMIR 100 UNIT/ML [place] SOLN   Subcutaneous   Inject 25 Units into the skin every morning.         . IPRATROPIUM-ALBUTEROL 0.5-2.5 (3) MG/3ML IN SOLN   Nebulization   Take 3 mLs by nebulization. Inhale 1 vial thru nebulizer every 6-8 hours as needed for wheezing           . METFORMIN HCL 1000 MG PO TABS   Oral   Take 1 tablet (1,000 mg total) by mouth 2 (two) times daily with a meal.   60 tablet   5   . PRAVASTATIN SODIUM 40 MG PO TABS   Oral   Take 1 tablet (40 mg total) by mouth at bedtime.   30 tablet   5   . ZOLPIDEM TARTRATE 10 MG PO TABS   Oral   Take 10 mg by mouth at bedtime as needed. For sleep           BP 156/85  Pulse 103  Temp 98.1 F (36.7 C) (Oral)  Resp 16  Ht 5' 2.5" (1.588 m)  Wt 227 lb (102.967 kg)  BMI 40.86 kg/m2  SpO2 98%  Physical Exam  Nursing note and vitals reviewed. Constitutional: She is oriented to person, place, and time. She appears well-developed and well-nourished. No distress.  HENT:  Head: Normocephalic and atraumatic.  Eyes: EOM are normal. Pupils are equal, round, and reactive to light.  Neck: Normal range of motion. Neck supple.  Cardiovascular: Normal rate, regular rhythm, normal heart sounds and intact distal pulses.   No murmur heard. Pulmonary/Chest: Effort normal and breath sounds normal. No respiratory distress.  Musculoskeletal: She exhibits tenderness. She exhibits no edema.       Right shoulder: She exhibits tenderness and pain. She exhibits no bony tenderness, no swelling, no effusion, no crepitus, no deformity, no laceration, no spasm, normal pulse and normal strength.       Arms:      Diffuse ttp of the anterior and lateral right shoulder.  Pain has full passive ROM of the shoulder, but pain with active abduction of the arm.  No edema, bony deformity, erythema or distal tenderness. Radial pulse brisk, distal sensation intact, CR< 2 sec  Lymphadenopathy:    She has no cervical adenopathy.  Neurological: She is alert and oriented to person, place, and time. She exhibits normal muscle tone. Coordination normal.  Skin: Skin is warm and dry.    ED Course  Procedures (including critical care time)  Labs Reviewed - No data to display No results found.  Previous x-ray was  reviewed.    Sling applied, pain improved, remains NV intact  MDM   Previous ED chart and imaging reviewed.    Patient states she did not receive or either lost her discharge instructions that contained her prescription.  patient reviewed on [place] narcotic database, no ED prescriptions recently.  Possible rotator cuff or labral injury.  Will provide sling for comfort.  Pt agrees to close f/u with Dr. Romeo Apple.  Referral given.     Prescribed Percocet #15  Mliss Wedin L. Loyde Orth, [place] 04/02/12 0010

## 2012-04-02 NOTE — ED Provider Notes (Signed)
Medical screening examination/treatment/procedure(s) were performed by non-physician practitioner and as supervising physician I was immediately available for consultation/collaboration.   Audery Wassenaar L Jae Skeet, MD 04/02/12 0050 

## 2012-04-12 ENCOUNTER — Other Ambulatory Visit: Payer: Self-pay | Admitting: Family Medicine

## 2012-04-12 DIAGNOSIS — Z139 Encounter for screening, unspecified: Secondary | ICD-10-CM

## 2012-04-17 ENCOUNTER — Ambulatory Visit (INDEPENDENT_AMBULATORY_CARE_PROVIDER_SITE_OTHER): Payer: Medicare Other | Admitting: Orthopedic Surgery

## 2012-04-17 ENCOUNTER — Encounter: Payer: Self-pay | Admitting: Orthopedic Surgery

## 2012-04-17 VITALS — BP 130/78 | Ht 62.5 in | Wt 232.4 lb

## 2012-04-17 DIAGNOSIS — M75100 Unspecified rotator cuff tear or rupture of unspecified shoulder, not specified as traumatic: Secondary | ICD-10-CM | POA: Insufficient documentation

## 2012-04-17 DIAGNOSIS — S43429A Sprain of unspecified rotator cuff capsule, initial encounter: Secondary | ICD-10-CM

## 2012-04-17 MED ORDER — HYDROCODONE-ACETAMINOPHEN 7.5-325 MG PO TABS: 1.0000 | ORAL_TABLET | ORAL | Status: DC | PRN

## 2012-04-17 NOTE — Progress Notes (Signed)
Patient ID: Tiffany Jacobs, female   DOB: 1945/10/22, 66 y.o.   MRN: [id number] Chief Complaint  Patient presents with  . Shoulder Pain    Right shoulder pain s/p fall DOI 03/29/12    The patient is coming to [place] from the emergency room with a chief complaint of pain in her right arm and right shoulder she began on 03/29/2012 after she fell getting out of her [employer]. She says that she fell forward landing onto her right and left arm injured her back with some radiating knee pain and some other aches and pains which were relieved by Percocet. She also tried some ibuprofen once the Percocet ran out.  She's basically having dull throbbing 4/10 intermittent pain in her right shoulder with decreased range of motion especially internal rotation and forward elevation which is increased at night and reaching over her head or attempting to she can only reach her arm up to 90 flexion she has some numbness which resolved she has some swelling but is mainly in her lower back and legs and that has gotten better as well.  Review of systems she has a history of weight gain and fatigue she denied chest pain shortness of breath heartburn frequency changes in her skin nervousness easy bleeding or excessive thirst  Past Medical History  Diagnosis Date  . Hypertension   . Diabetes mellitus, type 2   . Sciatica   . Low back pain   . Obesity   . Seasonal allergies   . Asthma   . RAD (reactive airway disease)     BP 130/78  Ht 5' 2.5" (1.588 m)  Wt 232 lb 6.4 oz (105.416 kg)  BMI 41.8 kg/m2  Physical exam findings overall she is somewhat obese she is well-developed and well-nourished. She is oriented x3 Mood is somewhat flat affect is depressed She is ambulating with no assistive devices and a limp  Left shoulder no tenderness or swelling, full range of motion, normal stability tests normal strength normal skin normal pulse and temperature normal sensation normal reflexes  Right shoulder she is tender  over the front of the shoulder and anterior joint line as well as rotator interval Internal rotation only to the front pocket, external rotation is 45 forward elevation 90 she exhibits 3/5 supraspinatus strength skin is intact pulse and temperature are normal sensation is normal  Radiographically to the hospital show normal humerus slight decrease in the acromiohumeral distance  Impression rotator cuff tear  Plan recommend MRI to evaluate the rotator cuff tear plan surgical treatment  Medical decision-making review records and x-ray report new problem further workup planned MRI.

## 2012-04-19 ENCOUNTER — Ambulatory Visit (INDEPENDENT_AMBULATORY_CARE_PROVIDER_SITE_OTHER): Payer: Medicare Other | Admitting: Otolaryngology

## 2012-04-24 ENCOUNTER — Other Ambulatory Visit: Payer: Self-pay | Admitting: Family Medicine

## 2012-04-30 ENCOUNTER — Other Ambulatory Visit: Payer: Medicare Other

## 2012-05-16 ENCOUNTER — Ambulatory Visit
Admission: RE | Admit: 2012-05-16 | Discharge: 2012-05-16 | Disposition: A | Discharge status: Home / Self Care | Payer: Medicare Other | Source: Ambulatory Visit | Attending: Orthopedic Surgery | Admitting: Orthopedic Surgery

## 2012-05-16 DIAGNOSIS — M75100 Unspecified rotator cuff tear or rupture of unspecified shoulder, not specified as traumatic: Secondary | ICD-10-CM

## 2012-05-22 ENCOUNTER — Ambulatory Visit (HOSPITAL_COMMUNITY): Payer: Medicare Other

## 2012-05-23 ENCOUNTER — Other Ambulatory Visit: Payer: Self-pay | Admitting: Orthopedic Surgery

## 2012-05-23 ENCOUNTER — Telehealth: Payer: Self-pay | Admitting: Orthopedic Surgery

## 2012-05-23 ENCOUNTER — Ambulatory Visit: Payer: Medicare Other | Admitting: Orthopedic Surgery

## 2012-05-23 NOTE — Telephone Encounter (Signed)
Message copied by Vickki Hearing on Wed May 23, 2012 11:47 AM ------      Message from: Cammie Sickle A      Created: Wed May 23, 2012  9:48 AM       I am not seeing PT referral in [facility]'s chart.  Okey Regal      ----- Message -----         From: Vickki Hearing, MD         Sent: 05/23/2012   8:34 AM           To: Doristine Section            Call her to give instructions on scheduling therapy             Orders are in epic        ------

## 2012-05-24 ENCOUNTER — Other Ambulatory Visit: Payer: Self-pay | Admitting: *Deleted

## 2012-05-24 DIAGNOSIS — M75101 Unspecified rotator cuff tear or rupture of right shoulder, not specified as traumatic: Secondary | ICD-10-CM

## 2012-05-24 MED ORDER — HYDROCODONE-ACETAMINOPHEN 7.5-325 MG PO TABS: 1.0000 | ORAL_TABLET | ORAL | Status: DC | PRN

## 2012-05-28 ENCOUNTER — Ambulatory Visit (HOSPITAL_COMMUNITY)
Admission: RE | Admit: 2012-05-28 | Discharge: 2012-05-28 | Disposition: A | Discharge status: Home / Self Care | Payer: Medicare Other | Source: Ambulatory Visit | Attending: Family Medicine | Admitting: Family Medicine

## 2012-05-28 DIAGNOSIS — Z139 Encounter for screening, unspecified: Secondary | ICD-10-CM

## 2012-05-28 DIAGNOSIS — Z1231 Encounter for screening mammogram for malignant neoplasm of breast: Secondary | ICD-10-CM | POA: Insufficient documentation

## 2012-05-29 ENCOUNTER — Encounter: Payer: Self-pay | Admitting: Orthopedic Surgery

## 2012-05-29 ENCOUNTER — Telehealth: Payer: Self-pay | Admitting: Orthopedic Surgery

## 2012-05-29 NOTE — Telephone Encounter (Signed)
Left message for patient to return my call.

## 2012-05-29 NOTE — Telephone Encounter (Signed)
Patient called, states had received MRI results and was prescribed physical therapy per Dr. Romeo Apple.  She has an appointment for therapy at [facility] on 06/04/12;  States now is considering surgery rather than therapy.  Please advise.  Ph # [phone].

## 2012-05-29 NOTE — Telephone Encounter (Signed)
Scheduled patient to come in for an appointment to discuss surgery per Dr. Romeo Apple

## 2012-05-29 NOTE — Telephone Encounter (Signed)
Patient called back and stated she wants to have surgery rather than going to therapy,  as previously discussed. States she is having trouble sleeping at night and just wants some relief. Would like to have it done as soon as possible. Ok to schedule?

## 2012-06-04 ENCOUNTER — Ambulatory Visit (HOSPITAL_COMMUNITY): Admission: RE | Admit: 2012-06-04 | Payer: Medicare Other | Source: Ambulatory Visit

## 2012-06-05 ENCOUNTER — Encounter: Payer: Self-pay | Admitting: Orthopedic Surgery

## 2012-06-05 ENCOUNTER — Ambulatory Visit (INDEPENDENT_AMBULATORY_CARE_PROVIDER_SITE_OTHER): Payer: Medicare Other | Admitting: Orthopedic Surgery

## 2012-06-05 DIAGNOSIS — M75101 Unspecified rotator cuff tear or rupture of right shoulder, not specified as traumatic: Secondary | ICD-10-CM

## 2012-06-05 DIAGNOSIS — S43429A Sprain of unspecified rotator cuff capsule, initial encounter: Secondary | ICD-10-CM

## 2012-06-05 NOTE — Progress Notes (Signed)
Patient ID: Tiffany Jacobs, female   DOB: 02-Apr-1945, 67 y.o.   MRN: [id number] Chief Complaint  Patient presents with  . Follow-up    Right shoulder cuff repair, preop for surgery   IMPRESSION:  1. Large chronic rotator cuff tear as described. There is  supraspinatus and infraspinatus tendon retraction and significant  muscular atrophy.  2. Subscapularis tendinosis and partial tearing.  3. Bicipital tendinosis and subluxation.  4. Acromioclavicular and glenohumeral degenerative changes with  diffuse labral degeneration.   As we were doing a preoperative evaluation the patient tells me that her sugar is not controlled because she can't afford the diabetic medication  She will need preoperative clearance prior to undergoing operative surgery   I reviewed her MRI showed the pictures to explain my concerns about her healing in these types of tears in the setting of diabetes but she wishes to proceed with surgery anyway  Time spent 15 minutes

## 2012-06-05 NOTE — Patient Instructions (Addendum)
Pre-op visit from DR. Simpson: diabetic management  ASAP

## 2012-06-06 LAB — COMPLETE METABOLIC PANEL WITH GFR
Alkaline Phosphatase: 89 U/L (ref 39–117)
BUN: 11 mg/dL (ref 6–23)
Creat: 0.77 mg/dL (ref 0.50–1.10)
GFR, Est Non African American: 81 mL/min
Glucose, Bld: 226 mg/dL — ABNORMAL HIGH (ref 70–99)
Sodium: 142 mEq/L (ref 135–145)
Total Bilirubin: 0.4 mg/dL (ref 0.3–1.2)
Total Protein: 6.9 g/dL (ref 6.0–8.3)

## 2012-06-06 LAB — LIPID PANEL
Cholesterol: 134 mg/dL (ref 0–200)
HDL: 39 mg/dL — ABNORMAL LOW (ref >39)
Total CHOL/HDL Ratio: 3.4 Ratio
Triglycerides: 114 mg/dL (ref <150)

## 2012-06-15 ENCOUNTER — Encounter: Payer: Self-pay | Admitting: Family Medicine

## 2012-06-15 ENCOUNTER — Ambulatory Visit (INDEPENDENT_AMBULATORY_CARE_PROVIDER_SITE_OTHER): Payer: Medicare Other | Admitting: Family Medicine

## 2012-06-15 VITALS — BP 128/88 | HR 106 | Resp 16 | Wt 226.0 lb

## 2012-06-15 DIAGNOSIS — I10 Essential (primary) hypertension: Secondary | ICD-10-CM

## 2012-06-15 DIAGNOSIS — G4700 Insomnia, unspecified: Secondary | ICD-10-CM

## 2012-06-15 DIAGNOSIS — IMO0001 Reserved for inherently not codable concepts without codable children: Secondary | ICD-10-CM

## 2012-06-15 DIAGNOSIS — E669 Obesity, unspecified: Secondary | ICD-10-CM

## 2012-06-15 DIAGNOSIS — E785 Hyperlipidemia, unspecified: Secondary | ICD-10-CM

## 2012-06-15 DIAGNOSIS — J45991 Cough variant asthma: Secondary | ICD-10-CM

## 2012-06-15 DIAGNOSIS — E119 Type 2 diabetes mellitus without complications: Secondary | ICD-10-CM

## 2012-06-15 DIAGNOSIS — E1065 Type 1 diabetes mellitus with hyperglycemia: Secondary | ICD-10-CM

## 2012-06-15 MED ORDER — INSULIN DETEMIR 100 UNIT/ML ~~LOC~~ SOLN: SUBCUTANEOUS | Status: DC

## 2012-06-15 MED ORDER — SPIRONOLACTONE 50 MG PO TABS: 50.0000 mg | ORAL_TABLET | Freq: Every day | ORAL | Status: DC

## 2012-06-15 MED ORDER — ALBUTEROL SULFATE HFA 108 (90 BASE) MCG/ACT IN AERS: 2.0000 | INHALATION_SPRAY | Freq: Four times a day (QID) | RESPIRATORY_TRACT | Status: DC | PRN

## 2012-06-15 MED ORDER — METFORMIN HCL 1000 MG PO TABS: 1000.0000 mg | ORAL_TABLET | Freq: Two times a day (BID) | ORAL | Status: DC

## 2012-06-15 NOTE — Assessment & Plan Note (Signed)
Controlled, no change in medication  

## 2012-06-15 NOTE — Assessment & Plan Note (Signed)
Controlled, no change in medication Hyperlipidemia:Low fat diet discussed and encouraged.  \ 

## 2012-06-15 NOTE — Assessment & Plan Note (Addendum)
Uncontrolled, dose increase in spironolactone DASH diet and commitment to daily physical activity for a minimum of 30 minutes discussed and encouraged, as a part of hypertension management. The importance of attaining a healthy weight is also discussed.

## 2012-06-15 NOTE — Assessment & Plan Note (Signed)
Deteriorated. Patient re-educated about  the importance of commitment to a  minimum of 150 minutes of exercise per week. The importance of healthy food choices with portion control discussed. Encouraged to start a food diary, count calories and to consider  joining a support group. Sample diet sheets offered. Goals set by the patient for the next several months.    

## 2012-06-15 NOTE — Patient Instructions (Addendum)
F/U in  2.5  Month, call if you need me before  Please contact Blima Singer as we have discussed, you NEED insulin to get blood sugar controlled.  When/if I hear from her that you cannot get help then we will see what we are able  To do    Blood pressure is high so increase spironolactone to 50 mg once daily, take two 25mg  tabs daily till done

## 2012-06-15 NOTE — Progress Notes (Signed)
  Subjective:    Patient ID: Tiffany Jacobs, female    DOB: Nov 10, 1945, 67 y.o.   MRN: [id number]  HPI The PT is here for follow up and re-evaluation of chronic medical conditions, medication management and review of any available recent lab and radiology data.  Preventive health is updated, specifically  Cancer screening and Immunization.   Questions or concerns regarding consultations or procedures which the PT has had in the interim are  Addressed.Has severely injured right shoulder and is to have surgery, however,based on recent labs this is being delayed because of uncontrolled blood sugar The PT denies any adverse reactions to current medications since the last visit. Still has not made the move to get her insulin through the health dept and blood sugar has worsened, states she will address this     Review of Systems See HPI Denies recent fever or chills. Denies sinus pressure, nasal congestion, ear pain or sore throat. Denies chest congestion, productive cough or wheezing. Denies chest pains, palpitations and leg swelling Denies abdominal pain, nausea, vomiting,diarrhea or constipation.   Denies dysuria, frequency, hesitancy or incontinence. Denies headaches, seizures, numbness, or tingling. Denies depression, anxiety or insomnia. Denies skin break down or rash.        Objective:   Physical Exam  Patient alert and oriented and in no cardiopulmonary distress.  HEENT: No facial asymmetry, EOMI, no sinus tenderness,  oropharynx pink and moist.  Neck supple no adenopathy.  Chest: Clear to auscultation bilaterally.  CVS: S1, S2 no murmurs, no S3.  ABD: Soft non tender. Bowel sounds normal.  Ext: No edema  MS: Adequate ROM spine, decreased in shoulders and knees Skin: Intact, no ulcerations or rash noted.  Psych: Good eye contact, normal affect. Memory intact not anxious mildly  depressed appearing.  CNS: CN 2-12 intact, power, tone and sensation normal  throughout.       Assessment & Plan:

## 2012-06-18 ENCOUNTER — Telehealth: Payer: Self-pay | Admitting: Family Medicine

## 2012-06-18 ENCOUNTER — Other Ambulatory Visit: Payer: Self-pay | Admitting: Family Medicine

## 2012-06-27 NOTE — Telephone Encounter (Signed)
Called and left msg for patient to return call.  

## 2012-06-27 NOTE — Telephone Encounter (Signed)
Spoke with patient and instructed her to call to health department and ask for the Prescription Assistance Program.

## 2012-07-05 ENCOUNTER — Ambulatory Visit (INDEPENDENT_AMBULATORY_CARE_PROVIDER_SITE_OTHER): Payer: Medicare Other | Admitting: Otolaryngology

## 2012-07-06 ENCOUNTER — Other Ambulatory Visit: Payer: Self-pay | Admitting: Family Medicine

## 2012-07-06 ENCOUNTER — Telehealth: Payer: Self-pay | Admitting: Family Medicine

## 2012-07-06 MED ORDER — DAPAGLIFLOZIN PROPANEDIOL 5 MG PO TABS: 1.0000 | ORAL_TABLET | Freq: Every day | ORAL | Status: AC

## 2012-07-06 NOTE — Telephone Encounter (Signed)
Called patient and left message for them to return call at the office   

## 2012-07-06 NOTE — Telephone Encounter (Signed)
Please give pt farxiga 5mg  one tablet daily 1 month supply. She is to continue her other tablets for diabetes. This works by increasing glucose passed out in urine. She needs to ensure good water intake . Side effect may be vaginal yeast infection, may also help with weight loss, but if she keeps sugar intake down , she should do well If they work well for her dose can be increased to 10mg  next 4 weeks she will keep in touch and let us know, we can expect to help withsamples short term even

## 2012-07-10 NOTE — Telephone Encounter (Signed)
Pt aware.

## 2012-07-25 ENCOUNTER — Other Ambulatory Visit: Payer: Self-pay | Admitting: Orthopedic Surgery

## 2012-07-25 MED ORDER — HYDROCODONE-ACETAMINOPHEN 5-325 MG PO TABS: 1.0000 | ORAL_TABLET | Freq: Four times a day (QID) | ORAL | Status: DC | PRN

## 2012-08-07 ENCOUNTER — Other Ambulatory Visit: Payer: Self-pay | Admitting: Family Medicine

## 2012-08-08 ENCOUNTER — Telehealth: Payer: Self-pay | Admitting: Family Medicine

## 2012-08-08 ENCOUNTER — Other Ambulatory Visit: Payer: Self-pay | Admitting: Family Medicine

## 2012-08-08 DIAGNOSIS — I10 Essential (primary) hypertension: Secondary | ICD-10-CM

## 2012-08-08 DIAGNOSIS — R5383 Other fatigue: Secondary | ICD-10-CM

## 2012-08-08 DIAGNOSIS — IMO0001 Reserved for inherently not codable concepts without codable children: Secondary | ICD-10-CM

## 2012-08-08 DIAGNOSIS — E785 Hyperlipidemia, unspecified: Secondary | ICD-10-CM

## 2012-08-08 MED ORDER — DAPAGLIFLOZIN PROPANEDIOL 5 MG PO TABS: 1.0000 | ORAL_TABLET | Freq: Every day | ORAL | Status: DC

## 2012-08-08 MED ORDER — DAPAGLIFLOZIN PROPANEDIOL 10 MG PO TABS: 1.0000 | ORAL_TABLET | Freq: Every day | ORAL | Status: DC

## 2012-08-08 NOTE — Telephone Encounter (Signed)
Called patient and left message for them to return call at the office   

## 2012-08-08 NOTE — Telephone Encounter (Signed)
pls give her 2 months of 10mg  tabs one daily, she should have blood work and an appt by end July, lab is hBA1C chem 7 and EGFR, pls order if not alreadty done and she does need the appt if none, thanks

## 2012-08-08 NOTE — Telephone Encounter (Signed)
She is asking for more samples of farxiga. She has been on the 5mg . We have the 10 mg in office. Do you want to increase her to the 10? If not I need to call for some more 5mg . Let me know.

## 2012-08-08 NOTE — Telephone Encounter (Signed)
Needs fasting lipid, cmp and eGFR, also hBA1C, this is what is correct

## 2012-08-09 NOTE — Telephone Encounter (Signed)
Called patient and left message for them to return call at the office   

## 2012-08-09 NOTE — Telephone Encounter (Signed)
Patient aware.

## 2012-08-23 ENCOUNTER — Ambulatory Visit (INDEPENDENT_AMBULATORY_CARE_PROVIDER_SITE_OTHER): Payer: Medicare Other | Admitting: Otolaryngology

## 2012-08-23 DIAGNOSIS — J310 Chronic rhinitis: Secondary | ICD-10-CM

## 2012-08-23 DIAGNOSIS — J330 Polyp of nasal cavity: Secondary | ICD-10-CM

## 2012-08-27 ENCOUNTER — Telehealth: Payer: Self-pay

## 2012-08-27 NOTE — Telephone Encounter (Signed)
T.O Dr. Lodema Hong  Reduce glipizide to half the dose now prescribed.   Patient states that she is only taking 10mg  daily now.  Instructed to half dose and will followup midway through week.

## 2012-09-03 NOTE — Telephone Encounter (Signed)
Noted this was discussed , when pt was called back blood sugars had improved

## 2012-09-05 ENCOUNTER — Ambulatory Visit: Payer: Medicare Other | Admitting: Family Medicine

## 2012-09-09 ENCOUNTER — Other Ambulatory Visit: Payer: Self-pay | Admitting: Family Medicine

## 2012-09-13 ENCOUNTER — Other Ambulatory Visit: Payer: Self-pay

## 2012-09-14 ENCOUNTER — Ambulatory Visit (INDEPENDENT_AMBULATORY_CARE_PROVIDER_SITE_OTHER): Payer: Medicare Other | Admitting: Family Medicine

## 2012-09-14 ENCOUNTER — Encounter: Payer: Self-pay | Admitting: Family Medicine

## 2012-09-14 VITALS — BP 124/84 | HR 94 | Resp 16 | Ht 62.5 in | Wt 221.1 lb

## 2012-09-14 DIAGNOSIS — E1065 Type 1 diabetes mellitus with hyperglycemia: Secondary | ICD-10-CM

## 2012-09-14 DIAGNOSIS — E785 Hyperlipidemia, unspecified: Secondary | ICD-10-CM

## 2012-09-14 DIAGNOSIS — E669 Obesity, unspecified: Secondary | ICD-10-CM

## 2012-09-14 DIAGNOSIS — I10 Essential (primary) hypertension: Secondary | ICD-10-CM

## 2012-09-14 DIAGNOSIS — IMO0002 Reserved for concepts with insufficient information to code with codable children: Secondary | ICD-10-CM

## 2012-09-14 DIAGNOSIS — G4700 Insomnia, unspecified: Secondary | ICD-10-CM

## 2012-09-14 MED ORDER — DAPAGLIFLOZIN PROPANEDIOL 5 MG PO TABS: 1.0000 | ORAL_TABLET | Freq: Every day | ORAL | Status: DC

## 2012-09-14 NOTE — Patient Instructions (Addendum)
F/u end October ,with rectal, or early Novemebr.  Call if you need me before  Take 1 glipizide daily as you are doing now, though script says twice daily, also farxiga is 5mg  daily and metformin 100mg  twice daily  Test sugar once daily.  Call with concern  Labs end July PLEASE we will contact after results are in   Blood pressure today is good, take meds you are taking   PLEASE bring ALL medicine so I can really know what you are taking so your medical record is accurate

## 2012-09-15 NOTE — Assessment & Plan Note (Signed)
Improved, based on review of blood sugar diary brought into the visit. Current meds are farxiga 5mg , metformin 2000mg  , and glipizide 10mg  once daily Patient advised to reduce carb and sweets, commit to regular physical activity, take meds as prescribed, test blood as directed, and attempt to lose weight, to improve blood sugar control. Approx 10 mins spent in pt education which seems to be improving slowly but surely, will follow next HBA1c closely and modify medicaion accordingly. Still needs to attend diabetic ed, fluctuation in sugars has to improve for better control, she understands

## 2012-09-15 NOTE — Assessment & Plan Note (Signed)
continue medication, sleep hygiene reviewed

## 2012-09-15 NOTE — Assessment & Plan Note (Signed)
Hyperlipidemia:Low fat diet discussed and encouraged.  updated lab needed

## 2012-09-15 NOTE — Assessment & Plan Note (Signed)
Improved. Pt applauded on succesful weight loss through lifestyle change, and encouraged to continue same. Weight loss goal set for the next several months.  

## 2012-09-15 NOTE — Progress Notes (Signed)
  Subjective:    Patient ID: Tiffany Jacobs, female    DOB: 21-Dec-1945, 67 y.o.   MRN: [id number]  HPI The PT is here for follow up and re-evaluation of chronic medical conditions, medication management and review of any available recent lab and radiology data.  Preventive health is updated, specifically  Cancer screening and Immunization.   Questions or concerns regarding consultations or procedures which the PT has had in the interim are  addressed. 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 Denies sinus pressure, nasal congestion, ear pain or sore throat. Denies chest congestion, productive cough or wheezing. Denies chest pains, palpitations and leg swelling Denies abdominal pain, nausea, vomiting,diarrhea or constipation.   Denies dysuria, frequency, hesitancy or incontinence. C/o uncontrolled shoulder pain, has to wait on better blood suagr control before surgery is considered Denies headaches, seizures, numbness, or tingling. Denies uncontrolled  depression, anxiety or insomnia. Denies skin break down or rash.        Objective:   Physical Exam Patient alert and oriented and in no cardiopulmonary distress.  HEENT: No facial asymmetry, EOMI, no sinus tenderness,  oropharynx pink and moist.  Neck supple no adenopathy.  Chest: Clear to auscultation bilaterally.  CVS: S1, S2 no murmurs, no S3.  ABD: Soft non tender. Bowel sounds normal.  Ext: No edema  JY:NWGNFAOZH ROM spine, shoulders, hips and knees.  Skin:  Intact, no ulcerations or rash noted.  Psych: Good eye contact, normal affect. Memory intact not anxious or depressed appearing.  CNS: CN 2-12 intact, power, tone and sensation normal throughout.        Assessment & Plan:

## 2012-09-15 NOTE — Assessment & Plan Note (Signed)
Controlled, no change in medication DASH diet and commitment to daily physical activity for a minimum of 30 minutes discussed and encouraged, as a part of hypertension management. The importance of attaining a healthy weight is also discussed.  

## 2012-09-20 ENCOUNTER — Other Ambulatory Visit: Payer: Self-pay

## 2012-09-20 DIAGNOSIS — E119 Type 2 diabetes mellitus without complications: Secondary | ICD-10-CM

## 2012-09-20 DIAGNOSIS — I10 Essential (primary) hypertension: Secondary | ICD-10-CM

## 2012-09-20 MED ORDER — ZOLPIDEM TARTRATE 10 MG PO TABS: ORAL_TABLET | ORAL | Status: DC

## 2012-09-20 MED ORDER — AMLODIPINE BESYLATE 10 MG PO TABS: 10.0000 mg | ORAL_TABLET | Freq: Every day | ORAL | Status: DC

## 2012-09-20 MED ORDER — METFORMIN HCL 1000 MG PO TABS: 1000.0000 mg | ORAL_TABLET | Freq: Two times a day (BID) | ORAL | Status: DC

## 2012-09-20 MED ORDER — GLIPIZIDE 10 MG PO TABS: 10.0000 mg | ORAL_TABLET | Freq: Every day | ORAL | Status: DC

## 2012-09-26 ENCOUNTER — Telehealth: Payer: Self-pay | Admitting: Family Medicine

## 2012-09-26 MED ORDER — HYDROXYZINE HCL 25 MG PO TABS: ORAL_TABLET | ORAL | Status: DC

## 2012-09-26 NOTE — Telephone Encounter (Signed)
This was sent in last month but I refilled it again

## 2012-10-02 LAB — LIPID PANEL
Cholesterol: 125 mg/dL (ref 0–200)
HDL: 41 mg/dL (ref >39)
Total CHOL/HDL Ratio: 3 Ratio
Triglycerides: 181 mg/dL — ABNORMAL HIGH (ref <150)

## 2012-10-02 LAB — HEMOGLOBIN A1C: Hgb A1c MFr Bld: 7 % — ABNORMAL HIGH (ref <5.7)

## 2012-10-02 LAB — COMPLETE METABOLIC PANEL WITH GFR
Alkaline Phosphatase: 79 U/L (ref 39–117)
Creat: 0.9 mg/dL (ref 0.50–1.10)
GFR, Est Non African American: 67 mL/min
Glucose, Bld: 150 mg/dL — ABNORMAL HIGH (ref 70–99)
Sodium: 140 mEq/L (ref 135–145)
Total Bilirubin: 0.3 mg/dL (ref 0.3–1.2)
Total Protein: 7.3 g/dL (ref 6.0–8.3)

## 2012-10-04 ENCOUNTER — Telehealth (HOSPITAL_COMMUNITY): Payer: Self-pay | Admitting: Dietician

## 2012-10-04 NOTE — Telephone Encounter (Signed)
Called at 1350. No answer and unable to leave voicemail. Phone continued to ring.

## 2012-10-04 NOTE — Telephone Encounter (Signed)
Called back at 1134. Pt is not available. Will reattempt.

## 2012-10-04 NOTE — Telephone Encounter (Signed)
Called at 1503. Left message on voicemail re: available dates for diabetes classes. Offered 10/09/12 at 1000 and 10/18/12 at 1730 classes.

## 2012-10-04 NOTE — Telephone Encounter (Signed)
Received voicemail eft by pt at 0949. Inquiring about DM class registration.

## 2012-10-05 ENCOUNTER — Encounter: Payer: Self-pay | Admitting: Family Medicine

## 2012-10-05 ENCOUNTER — Ambulatory Visit: Payer: Medicare Other | Admitting: Family Medicine

## 2012-10-05 ENCOUNTER — Ambulatory Visit (INDEPENDENT_AMBULATORY_CARE_PROVIDER_SITE_OTHER): Payer: Medicare Other | Admitting: Family Medicine

## 2012-10-05 VITALS — BP 130/76 | HR 100 | Resp 18 | Ht 62.5 in | Wt 220.1 lb

## 2012-10-05 DIAGNOSIS — E669 Obesity, unspecified: Secondary | ICD-10-CM

## 2012-10-05 DIAGNOSIS — G4700 Insomnia, unspecified: Secondary | ICD-10-CM

## 2012-10-05 DIAGNOSIS — E1165 Type 2 diabetes mellitus with hyperglycemia: Secondary | ICD-10-CM

## 2012-10-05 DIAGNOSIS — M19019 Primary osteoarthritis, unspecified shoulder: Secondary | ICD-10-CM

## 2012-10-05 DIAGNOSIS — E785 Hyperlipidemia, unspecified: Secondary | ICD-10-CM

## 2012-10-05 DIAGNOSIS — I10 Essential (primary) hypertension: Secondary | ICD-10-CM

## 2012-10-05 MED ORDER — KETOROLAC TROMETHAMINE 60 MG/2ML IJ SOLN
60.0000 mg | Freq: Once | INTRAMUSCULAR | Status: AC
  Administered 2012-10-05: 60 mg via INTRAMUSCULAR

## 2012-10-05 MED ORDER — ZOLPIDEM TARTRATE 10 MG PO TABS: ORAL_TABLET | ORAL | Status: DC

## 2012-10-05 MED ORDER — PRAVASTATIN SODIUM 40 MG PO TABS: ORAL_TABLET | ORAL | Status: DC

## 2012-10-05 NOTE — Patient Instructions (Addendum)
F/u in mid Novemebr, call if you need me before  Flu vaccine available in October  CONGRATS on marked improvement in blood sugar.  Tiffany Jacobs 5mg  one daily , samples will be provided (if 10mg  tablet, break in half)  You will have an EKG  and CXR today as preparation for right shoulder surgery.EKG is within normal Blood pressure and labs are excellent , you are medically cleared as far as that is concerned and I will let dr Romeo Apple know Please continue to reduce sweets and starchy foods, take all medications as you are currently taking them  Non fasting HBA1C and chem 7 and EGFR in November before follow up visit  Toradol 60mg  IM and tylenol two tablets ion office today for right shoulder pain  All the best with your surgery

## 2012-10-05 NOTE — Progress Notes (Signed)
  Subjective:    Patient ID: Tiffany Jacobs, female    DOB: 02-Jan-1946, 67 y.o.   MRN: [id number]  HPI The PT is here for follow up and re-evaluation of chronic medical conditions, medication management and review of any available recent lab and radiology data.  Preventive health is updated, specifically  Cancer screening and Immunization.   She is anxious about  her blood sugar since she has to wait on right shoulder surgery until this is , the pain is uncontrolled and unbearable and her mobility is severely limited. She has no money to afford her pain med prescribed by orhto The PT denies any adverse reactions to current medications since the last visit.       Review of Systems See HPI Denies recent fever or chills. Denies sinus pressure, nasal congestion, ear pain or sore throat. Denies chest congestion, productive cough or wheezing. Denies chest pains, palpitations and leg swelling Denies abdominal pain, nausea, vomiting,diarrhea or constipation.   Denies dysuria, frequency, hesitancy or incontinence. Denies headaches, seizures, numbness, or tingling. Denies depression, anxiety or insomnia. Denies skin break down or rash.        Objective:   Physical Exam Patient alert and oriented and in no cardiopulmonary distress.  HEENT: No facial asymmetry, EOMI, no sinus tenderness,  oropharynx pink and moist.  Neck supple no adenopathy.  Chest: Clear to auscultation bilaterally.  CVS: S1, S2 no murmurs, no S3.  ABD: Soft non tender. Bowel sounds normal.  Ext: No edema  MS: Adequate ROM spine,  hips and knees.Decrease in right shoulder  Skin: Intact, no ulcerations or rash noted.  Psych: Good eye contact, normal affect. Memory intact not anxious or depressed appearing.  CNS: CN 2-12 intact, power, tone and sensation normal throughout.        Assessment & Plan:

## 2012-10-06 NOTE — Assessment & Plan Note (Signed)
Marked improvement, stable for surgery. No med changes Patient advised to reduce carb and sweets, commit to regular physical activity, take meds as prescribed, test blood as directed, and attempt to lose weight, to improve blood sugar control.

## 2012-10-06 NOTE — Assessment & Plan Note (Signed)
Controlled, no change in medication DASH diet and commitment to daily physical activity for a minimum of 30 minutes discussed and encouraged, as a part of hypertension management. The importance of attaining a healthy weight is also discussed.  

## 2012-10-06 NOTE — Assessment & Plan Note (Signed)
Unchanged, sleep hygiene reviewed, continue med as before

## 2012-10-06 NOTE — Assessment & Plan Note (Signed)
Needs to reduce cheese, eggs butter and fried foods Hyperlipidemia:Low fat diet discussed and encouraged.  No med change

## 2012-10-06 NOTE — Assessment & Plan Note (Signed)
Uncontrolled right shoulder pain awaiting surgery, toradol administered in office. CXR today for pre op clearance and EKG in office negative for ischemia

## 2012-10-06 NOTE — Assessment & Plan Note (Signed)
Improved. Pt applauded on succesful weight loss through lifestyle change, and encouraged to continue same. Weight loss goal set for the next several months.  

## 2012-10-08 ENCOUNTER — Telehealth (HOSPITAL_COMMUNITY): Payer: Self-pay | Admitting: Dietician

## 2012-10-08 NOTE — Telephone Encounter (Signed)
Called on 10/05/12 at 1034. Unable to leave message.

## 2012-10-08 NOTE — Telephone Encounter (Signed)
Called again on 10/05/12 at 1122. Pt unavailable.

## 2012-10-08 NOTE — Telephone Encounter (Signed)
Received voicemail from pt left on 7/31/14at 1746.

## 2012-10-08 NOTE — Telephone Encounter (Signed)
Called and left message at 1640.

## 2012-10-10 NOTE — Telephone Encounter (Signed)
Received message from pt at 1010.

## 2012-10-10 NOTE — Telephone Encounter (Signed)
Called at 1126 and 1128. Unable to leave message.

## 2012-10-11 NOTE — Telephone Encounter (Signed)
Called at 1347. Pt registered for 10/18/12 class.

## 2012-10-17 ENCOUNTER — Encounter: Payer: Self-pay | Admitting: Family Medicine

## 2012-10-18 ENCOUNTER — Encounter (HOSPITAL_COMMUNITY): Payer: Self-pay | Admitting: Dietician

## 2012-10-18 NOTE — Progress Notes (Signed)
[facility] Diabetes Class Completion  Date:October 18, 2012  Time: 1730  Pt attended [facility]'s Diabetes [facility] Education Class on October 18, 2012.   Patient was educated on the following topics:   -Survival skills (signs and symptoms of hyperglycemia and hypoglycemia, treatment for hypoglycemia, ideal levels for fasting and postprandial blood sugars, goal Hgb A1c level, foot care basics)  -Recommendations for physical activity   -Carbohydrate metabolism in relation to diabetes   -Meal planning (sources of carbohydrate, carbohydrate counting, meal planning strategies, food label reading, and portion control).  Handouts provided:  -"Diabetes and You: Taking Charge of Your Health"  -"Carbohydrate Counting and Meal Planning"  -"Blood Sugar Diary"  -"Your Guide to Better Office Visits"   Nancye Grumbine A. Mayford Knife, RD, LDN

## 2012-10-23 ENCOUNTER — Ambulatory Visit (INDEPENDENT_AMBULATORY_CARE_PROVIDER_SITE_OTHER): Payer: Medicare Other | Admitting: Orthopedic Surgery

## 2012-10-23 ENCOUNTER — Encounter: Payer: Self-pay | Admitting: Orthopedic Surgery

## 2012-10-23 VITALS — BP 142/94 | Ht 62.5 in | Wt 221.0 lb

## 2012-10-23 DIAGNOSIS — S43429A Sprain of unspecified rotator cuff capsule, initial encounter: Secondary | ICD-10-CM

## 2012-10-23 DIAGNOSIS — M75101 Unspecified rotator cuff tear or rupture of right shoulder, not specified as traumatic: Secondary | ICD-10-CM

## 2012-10-23 NOTE — Patient Instructions (Addendum)
Open right rotator cuff repair chronic tear admit  You have been scheduled for rotator cuff surgery.  All surgeries carry some risk.  Remember you always have the option of continued nonsurgical treatment. However in this situation the risks vs. the benefits favor surgery as the best treatment option. The risks of the surgery includes the following but is not limited to bleeding, infection, pulmonary embolus, death from anesthesia, nerve injury vascular injury or need for further surgery, continued pain.  Specific to this procedure the following risks and complications are rare but possible Stiffness, pain, weakness, re tear.  I expect 9-12 months to fully recover from this procedure.

## 2012-10-23 NOTE — Progress Notes (Signed)
Patient ID: Tiffany Jacobs, female   DOB: 1946-02-17, 67 y.o.   MRN: [id number]  Chief Complaint  Patient presents with  . Follow-up    Recheck right shoulder and discuss surgery    BP 142/94  Ht 5' 2.5" (1.588 m)  Wt 221 lb (100.245 kg)  BMI 39.75 kg/m2  We discussed surgery again   Her diabetes has been controlled.  Fortunately she does have approximately 45 of external rotation and forward elevation of 110 with extreme weakness in supraspinatus tendon.  She will be scheduled for an open rotator cuff repair with a scheduled admission secondary to diabetes, pain control and her living situation.   OPEN RIGHT ROT CUFF REPAIR   SEEPREOP h/p FOR DETAILS

## 2012-10-24 ENCOUNTER — Other Ambulatory Visit: Payer: Self-pay | Admitting: *Deleted

## 2012-10-29 ENCOUNTER — Telehealth: Payer: Self-pay | Admitting: Orthopedic Surgery

## 2012-10-29 ENCOUNTER — Encounter (HOSPITAL_COMMUNITY): Payer: Self-pay | Admitting: Pharmacy Technician

## 2012-10-29 NOTE — Telephone Encounter (Signed)
Regarding surgery scheduled at [facility], 11/09/12, admit after surgery / observation stay, CPT 23412, 23410, ICD9  840.4, per Alease Frame, no pre-authorization is required for observation stay or out-patient; if admitted after surgery, "this would be handled after the patient's head is on the bed", on the hospital side, and clinical information would be requested.  Her name and today's date for reference , 10/29/12, 5:24p.m.

## 2012-11-02 ENCOUNTER — Encounter (HOSPITAL_COMMUNITY): Payer: Self-pay

## 2012-11-02 ENCOUNTER — Encounter (HOSPITAL_COMMUNITY)
Admission: RE | Admit: 2012-11-02 | Discharge: 2012-11-02 | Disposition: A | Discharge status: Home / Self Care | Payer: Medicare Other | Source: Ambulatory Visit | Attending: Orthopedic Surgery | Admitting: Orthopedic Surgery

## 2012-11-02 DIAGNOSIS — Z01812 Encounter for preprocedural laboratory examination: Secondary | ICD-10-CM | POA: Insufficient documentation

## 2012-11-02 HISTORY — DX: Unspecified osteoarthritis, unspecified site: M19.90

## 2012-11-02 HISTORY — DX: Gastro-esophageal reflux disease without esophagitis: K21.9

## 2012-11-02 LAB — BASIC METABOLIC PANEL
BUN: 13 mg/dL (ref 6–23)
CO2: 25 mEq/L (ref 19–32)
Chloride: 101 mEq/L (ref 96–112)
Glucose, Bld: 170 mg/dL — ABNORMAL HIGH (ref 70–99)
Potassium: 4.1 mEq/L (ref 3.5–5.1)

## 2012-11-02 NOTE — Patient Instructions (Addendum)
Tiffany Jacobs  11/02/2012   Your procedure is scheduled on:  11/09/2012  Report to [facility] at  945  AM.  Call this number if you have problems the morning of surgery: [phone]   Remember:   Do not eat food or drink liquids after midnight.   Take these medicines the morning of surgery with A SIP OF WATER: amlodipine. Take your albuterol, flonase and duoneb before you come.   Do not wear jewelry, make-up or nail polish.  Do not wear lotions, powders, or perfumes.   Do not shave 48 hours prior to surgery. Men may shave face and neck.  Do not bring valuables to the hospital.  [facility] is not responsible for any belongings or valuables.  Contacts, dentures or bridgework may not be worn into surgery.  Leave suitcase in the car. After surgery it may be brought to your room.  For patients admitted to the hospital, checkout time is 11:00 AM the day of discharge.   Patients discharged the day of surgery will not be allowed to drive home.  Name and phone number of your driver: family  Special Instructions: Shower using CHG 2 nights before surgery and the night before surgery.  If you shower the day of surgery use CHG.  Use special wash - you have one bottle of CHG for all showers.  You should use approximately 1/3 of the bottle for each shower.   Please read over the following fact sheets that you were given: Pain Booklet, Coughing and Deep Breathing, Surgical Site Infection Prevention, Anesthesia Post-op Instructions and Care and Recovery After Surgery Rotator Cuff Injury The rotator cuff is the collective set of muscles and tendons that make up the stabilizing unit of your shoulder. This unit holds in the ball of the humerus (upper arm bone) in the socket of the scapula (shoulder blade). Injuries to this stabilizing unit most commonly come from sports or activities that cause the arm to be moved repeatedly over the head. Examples of this include throwing, weight lifting, swimming,  racquet sports, or an injury such as falling on your arm. Chronic (longstanding) irritation of this unit can cause inflammation (soreness), bursitis, and eventual damage to the tendons to the point of rupture (tear). An acute (sudden) injury of the rotator cuff can result in a partial or complete tear. You may need surgery with complete tears. Small or partial rotator cuff tears may be treated conservatively with temporary immobilization, exercises and rest. Physical therapy may be needed. HOME CARE INSTRUCTIONS   Apply ice to the injury for 15-20 minutes 3-4 times per day for the first 2 days. Put the ice in a plastic bag and place a towel between the bag of ice and your skin.  If you have a shoulder immobilizer (sling and straps), do not remove it for as long as directed by your caregiver or until you see a caregiver for a follow-up examination. If you need to remove it, move your arm as little as possible.  You may want to sleep on several pillows or in a recliner at night to lessen swelling and pain.  Only take over-the-counter or prescription medicines for pain, discomfort, or fever as directed by your caregiver.  Do simple hand squeezing exercises with a soft rubber ball to decrease hand swelling. SEEK MEDICAL CARE IF:   Pain in your shoulder increases or new pain or numbness develops in your arm, hand, or fingers.  Your hand or fingers  are colder than your other hand. SEEK IMMEDIATE MEDICAL CARE IF:   Your arm, hand, or fingers are numb or tingling.  Your arm, hand, or fingers are increasingly swollen and painful, or turn white or blue. Document Released: 02/19/2000 Document Revised: 05/16/2011 Document Reviewed: 02/12/2008 [facility] Patient Information 2014 [place], [place]. PATIENT INSTRUCTIONS POST-ANESTHESIA  IMMEDIATELY FOLLOWING SURGERY:  Do not drive or operate machinery for the first twenty four hours after surgery.  Do not make any important decisions for twenty four hours  after surgery or while taking narcotic pain medications or sedatives.  If you develop intractable nausea and vomiting or a severe headache please notify your doctor immediately.  FOLLOW-UP:  Please make an appointment with your surgeon as instructed. You do not need to follow up with anesthesia unless specifically instructed to do so.  WOUND CARE INSTRUCTIONS (if applicable):  Keep a dry clean dressing on the anesthesia/puncture wound site if there is drainage.  Once the wound has quit draining you may leave it open to air.  Generally you should leave the bandage intact for twenty four hours unless there is drainage.  If the epidural site drains for more than 36-48 hours please call the anesthesia department.  QUESTIONS?:  Please feel free to call your physician or the hospital operator if you have any questions, and they will be happy to assist you.

## 2012-11-08 NOTE — H&P (Signed)
  Shoulder Pain        Right shoulder pain s/p fall DOI 03/29/12     The patient is coming to [place] from the emergency room with a chief complaint of pain in her right arm and right shoulder she began on 03/29/2012 after she fell getting out of her [employer]. She says that she fell forward landing onto her right and left arm injured her back with some radiating knee pain and some other aches and pains which were relieved by Percocet. She also tried some ibuprofen once the Percocet ran out.  She's basically having dull throbbing 4/10 intermittent pain in her right shoulder with decreased range of motion especially internal rotation and forward elevation which is increased at night and reaching over her head or attempting to she can only reach her arm up to 90 flexion she has some numbness which resolved she has some swelling but is mainly in her lower back and legs and that has gotten better as well.  Review of systems she has a history of weight gain and fatigue she denied chest pain shortness of breath heartburn frequency changes in her skin nervousness easy bleeding or excessive thirst    Past Medical History   Diagnosis  Date   .  Hypertension     .  Diabetes mellitus, type 2     .  Sciatica     .  Low back pain     .  Obesity     .  Seasonal allergies     .  Asthma     .  RAD (reactive airway disease)       BP 130/78  Ht 5' 2.5" (1.588 m)  Wt 232 lb 6.4 oz (105.416 kg)  BMI 41.8 kg/m2  Physical exam findings overall she is somewhat obese she is well-developed and well-nourished. She is oriented x3 Mood is somewhat flat affect is depressed She is ambulating with no assistive devices and a limp  Left shoulder no tenderness or swelling, full range of motion, normal stability tests normal strength normal skin normal pulse and temperature normal sensation normal reflexes  Right shoulder she is tender over the front of the shoulder and anterior joint line as well as rotator interval Internal  rotation only to the front pocket, external rotation is 45 forward elevation 90 she exhibits 3/5 supraspinatus strength skin is intact pulse and temperature are normal sensation is normal  Radiographs from hospital show normal humerus slight decrease in the acromiohumeral distance  MRI Rotator cuff:  There is a large full-thickness rotator cuff tear. The supraspinatus and infraspinatus tendons are completely torn with moderate retraction.  There is also prominent subscapularis tendinosis and partial insertional tearing without retraction.  The teres minor tendon is intact. Muscles:  There is moderate to severe supraspinatus and infraspinatus muscular atrophy.  No significant muscular edema is identified. Biceps long head:  There is marked tendinosis and partial tearing of the biceps tendon which is slightly subluxed medially from the superior aspect of the bicipital groove.  No discontinuity is identified. IMPRESSION:   1.  Large chronic rotator cuff tear as described.  There is supraspinatus and infraspinatus tendon retraction and significant muscular atrophy. 2.  Subscapularis tendinosis and partial tearing. 3.  Bicipital tendinosis and subluxation. 4.  Acromioclavicular and glenohumeral degenerative changes with diffuse labral degeneration.   Impression rotator cuff tear OPEN RIGHT ROT CUFF REPAIR address the biceps tendon as needed  SEEPREOP h/p FOR DETAILS

## 2012-11-09 ENCOUNTER — Inpatient Hospital Stay (HOSPITAL_COMMUNITY): Payer: Medicare Other | Admitting: Anesthesiology

## 2012-11-09 ENCOUNTER — Ambulatory Visit (HOSPITAL_COMMUNITY)
Admission: RE | Admit: 2012-11-09 | Discharge: 2012-11-10 | Disposition: A | Discharge status: Home / Self Care | Payer: Medicare Other | Source: Ambulatory Visit | Attending: Orthopedic Surgery | Admitting: Orthopedic Surgery

## 2012-11-09 ENCOUNTER — Encounter (HOSPITAL_COMMUNITY): Payer: Self-pay | Admitting: Anesthesiology

## 2012-11-09 ENCOUNTER — Encounter (HOSPITAL_COMMUNITY)
Admission: RE | Disposition: A | Discharge status: Home / Self Care | Payer: Self-pay | Source: Ambulatory Visit | Attending: Orthopedic Surgery

## 2012-11-09 ENCOUNTER — Encounter (HOSPITAL_COMMUNITY): Payer: Self-pay | Admitting: *Deleted

## 2012-11-09 DIAGNOSIS — Z6841 Body Mass Index (BMI) 40.0 and over, adult: Secondary | ICD-10-CM | POA: Insufficient documentation

## 2012-11-09 DIAGNOSIS — M719 Bursopathy, unspecified: Principal | ICD-10-CM | POA: Insufficient documentation

## 2012-11-09 DIAGNOSIS — E119 Type 2 diabetes mellitus without complications: Secondary | ICD-10-CM | POA: Insufficient documentation

## 2012-11-09 DIAGNOSIS — M24419 Recurrent dislocation, unspecified shoulder: Secondary | ICD-10-CM | POA: Insufficient documentation

## 2012-11-09 DIAGNOSIS — M67919 Unspecified disorder of synovium and tendon, unspecified shoulder: Principal | ICD-10-CM | POA: Insufficient documentation

## 2012-11-09 DIAGNOSIS — M545 Low back pain, unspecified: Secondary | ICD-10-CM | POA: Insufficient documentation

## 2012-11-09 DIAGNOSIS — M19019 Primary osteoarthritis, unspecified shoulder: Secondary | ICD-10-CM | POA: Insufficient documentation

## 2012-11-09 DIAGNOSIS — E669 Obesity, unspecified: Secondary | ICD-10-CM | POA: Insufficient documentation

## 2012-11-09 DIAGNOSIS — J45909 Unspecified asthma, uncomplicated: Secondary | ICD-10-CM | POA: Insufficient documentation

## 2012-11-09 DIAGNOSIS — M75120 Complete rotator cuff tear or rupture of unspecified shoulder, not specified as traumatic: Secondary | ICD-10-CM

## 2012-11-09 DIAGNOSIS — I10 Essential (primary) hypertension: Secondary | ICD-10-CM | POA: Insufficient documentation

## 2012-11-09 DIAGNOSIS — M75121 Complete rotator cuff tear or rupture of right shoulder, not specified as traumatic: Secondary | ICD-10-CM

## 2012-11-09 HISTORY — PX: ACROMIO-CLAVICULAR JOINT REPAIR: SHX5183

## 2012-11-09 HISTORY — PX: SHOULDER OPEN ROTATOR CUFF REPAIR: SHX2407

## 2012-11-09 HISTORY — PX: SHOULDER ACROMIOPLASTY: SHX6093

## 2012-11-09 LAB — CBC
HCT: 38.3 % (ref 36.0–46.0)
Hemoglobin: 12.8 g/dL (ref 12.0–15.0)
MCH: 28.1 pg (ref 26.0–34.0)
MCHC: 33.4 g/dL (ref 30.0–36.0)
MCV: 84 fL (ref 78.0–100.0)
RBC: 4.56 MIL/uL (ref 3.87–5.11)

## 2012-11-09 LAB — GLUCOSE, CAPILLARY
Glucose-Capillary: 194 mg/dL — ABNORMAL HIGH (ref 70–99)
Glucose-Capillary: 202 mg/dL — ABNORMAL HIGH (ref 70–99)
Glucose-Capillary: 216 mg/dL — ABNORMAL HIGH (ref 70–99)
Glucose-Capillary: 278 mg/dL — ABNORMAL HIGH (ref 70–99)

## 2012-11-09 LAB — CREATININE, SERUM: Creatinine, Ser: 0.74 mg/dL (ref 0.50–1.10)

## 2012-11-09 SURGERY — REPAIR, ROTATOR CUFF, OPEN
Anesthesia: General | Site: Shoulder | Laterality: Right | Wound class: Clean

## 2012-11-09 MED ORDER — HYDROCODONE-ACETAMINOPHEN 10-325 MG PO TABS
1.0000 | ORAL_TABLET | ORAL | Status: DC | PRN
  Administered 2012-11-10 (×2): 1 via ORAL
  Filled 2012-11-09 (×2): qty 1

## 2012-11-09 MED ORDER — DAPAGLIFLOZIN PROPANEDIOL 5 MG PO TABS
1.0000 | ORAL_TABLET | Freq: Every day | ORAL | Status: DC
  Administered 2012-11-10: 1 via ORAL
  Filled 2012-11-09 (×3): qty 1

## 2012-11-09 MED ORDER — ONDANSETRON HCL 4 MG PO TABS: 4.0000 mg | ORAL_TABLET | Freq: Four times a day (QID) | ORAL | Status: DC | PRN

## 2012-11-09 MED ORDER — EPHEDRINE SULFATE 50 MG/ML IJ SOLN
INTRAMUSCULAR | Status: AC
  Filled 2012-11-09: qty 1

## 2012-11-09 MED ORDER — PHENOL 1.4 % MT LIQD: 1.0000 | OROMUCOSAL | Status: DC | PRN

## 2012-11-09 MED ORDER — ALBUTEROL SULFATE (5 MG/ML) 0.5% IN NEBU
2.5000 mg | INHALATION_SOLUTION | Freq: Four times a day (QID) | RESPIRATORY_TRACT | Status: DC
Start: 2012-11-09 — End: 2012-11-10
  Administered 2012-11-09 – 2012-11-10 (×3): 2.5 mg via RESPIRATORY_TRACT
  Filled 2012-11-09 (×3): qty 0.5

## 2012-11-09 MED ORDER — CEFAZOLIN SODIUM-DEXTROSE 2-3 GM-% IV SOLR
INTRAVENOUS | Status: AC
  Filled 2012-11-09: qty 50

## 2012-11-09 MED ORDER — ONDANSETRON HCL 4 MG/2ML IJ SOLN: 4.0000 mg | Freq: Four times a day (QID) | INTRAMUSCULAR | Status: DC | PRN

## 2012-11-09 MED ORDER — INSULIN ASPART 100 UNIT/ML ~~LOC~~ SOLN
0.0000 [IU] | Freq: Three times a day (TID) | SUBCUTANEOUS | Status: DC
  Administered 2012-11-09: 3 [IU] via SUBCUTANEOUS
  Administered 2012-11-10: 2 [IU] via SUBCUTANEOUS
  Filled 2012-11-09: qty 0.09

## 2012-11-09 MED ORDER — ONDANSETRON HCL 4 MG/2ML IJ SOLN
4.0000 mg | Freq: Once | INTRAMUSCULAR | Status: AC
  Administered 2012-11-09: 4 mg via INTRAVENOUS

## 2012-11-09 MED ORDER — METHOCARBAMOL 500 MG PO TABS: 500.0000 mg | ORAL_TABLET | Freq: Four times a day (QID) | ORAL | Status: DC | PRN

## 2012-11-09 MED ORDER — ZOLPIDEM TARTRATE 5 MG PO TABS
5.0000 mg | ORAL_TABLET | Freq: Every evening | ORAL | Status: DC | PRN
  Administered 2012-11-10: 5 mg via ORAL
  Filled 2012-11-09: qty 1

## 2012-11-09 MED ORDER — METFORMIN HCL 500 MG PO TABS
1000.0000 mg | ORAL_TABLET | Freq: Two times a day (BID) | ORAL | Status: DC
Start: 2012-11-09 — End: 2012-11-10
  Administered 2012-11-09 – 2012-11-10 (×2): 1000 mg via ORAL
  Filled 2012-11-09: qty 2
  Filled 2012-11-09: qty 1
  Filled 2012-11-09: qty 2

## 2012-11-09 MED ORDER — DEXAMETHASONE SODIUM PHOSPHATE 4 MG/ML IJ SOLN
4.0000 mg | Freq: Once | INTRAMUSCULAR | Status: AC
  Administered 2012-11-09: 4 mg via INTRAVENOUS

## 2012-11-09 MED ORDER — SENNA 8.6 MG PO TABS
1.0000 | ORAL_TABLET | Freq: Two times a day (BID) | ORAL | Status: DC
  Administered 2012-11-09 – 2012-11-10 (×3): 8.6 mg via ORAL
  Filled 2012-11-09 (×3): qty 1

## 2012-11-09 MED ORDER — ROCURONIUM BROMIDE 50 MG/5ML IV SOLN
INTRAVENOUS | Status: AC
  Filled 2012-11-09: qty 1

## 2012-11-09 MED ORDER — CEFAZOLIN SODIUM-DEXTROSE 2-3 GM-% IV SOLR
2.0000 g | Freq: Four times a day (QID) | INTRAVENOUS | Status: AC
  Administered 2012-11-09 – 2012-11-10 (×3): 2 g via INTRAVENOUS
  Filled 2012-11-09 (×3): qty 50

## 2012-11-09 MED ORDER — SODIUM CHLORIDE 0.9 % IR SOLN
Status: DC | PRN
  Administered 2012-11-09 (×2): 1000 mL

## 2012-11-09 MED ORDER — MENTHOL 3 MG MT LOZG: 1.0000 | LOZENGE | OROMUCOSAL | Status: DC | PRN

## 2012-11-09 MED ORDER — FENTANYL CITRATE 0.05 MG/ML IJ SOLN
INTRAMUSCULAR | Status: AC
  Filled 2012-11-09: qty 5

## 2012-11-09 MED ORDER — ALBUTEROL SULFATE HFA 108 (90 BASE) MCG/ACT IN AERS: 2.0000 | INHALATION_SPRAY | Freq: Four times a day (QID) | RESPIRATORY_TRACT | Status: DC | PRN

## 2012-11-09 MED ORDER — BUPIVACAINE-EPINEPHRINE PF 0.5-1:200000 % IJ SOLN
INTRAMUSCULAR | Status: AC
  Filled 2012-11-09: qty 10

## 2012-11-09 MED ORDER — MIDAZOLAM HCL 2 MG/2ML IJ SOLN
1.0000 mg | INTRAMUSCULAR | Status: DC | PRN
  Administered 2012-11-09: 2 mg via INTRAVENOUS

## 2012-11-09 MED ORDER — METHOCARBAMOL 100 MG/ML IJ SOLN
500.0000 mg | Freq: Once | INTRAVENOUS | Status: AC
  Administered 2012-11-09: 500 mg via INTRAVENOUS
  Filled 2012-11-09: qty 5

## 2012-11-09 MED ORDER — NEOSTIGMINE METHYLSULFATE 1 MG/ML IJ SOLN
INTRAMUSCULAR | Status: DC | PRN
  Administered 2012-11-09: 2 mg via INTRAVENOUS

## 2012-11-09 MED ORDER — OXYCODONE HCL 5 MG PO TABS
5.0000 mg | ORAL_TABLET | ORAL | Status: DC | PRN
  Administered 2012-11-09: 10 mg via ORAL
  Filled 2012-11-09: qty 2

## 2012-11-09 MED ORDER — FLUTICASONE PROPIONATE 50 MCG/ACT NA SUSP
2.0000 | Freq: Every day | NASAL | Status: DC
  Administered 2012-11-09 – 2012-11-10 (×2): 2 via NASAL
  Filled 2012-11-09: qty 16

## 2012-11-09 MED ORDER — SIMVASTATIN 20 MG PO TABS
40.0000 mg | ORAL_TABLET | Freq: Every day | ORAL | Status: DC
  Administered 2012-11-09: 40 mg via ORAL
  Filled 2012-11-09: qty 2

## 2012-11-09 MED ORDER — ACETAMINOPHEN 650 MG RE SUPP: 650.0000 mg | Freq: Four times a day (QID) | RECTAL | Status: DC | PRN

## 2012-11-09 MED ORDER — ENOXAPARIN SODIUM 40 MG/0.4ML ~~LOC~~ SOLN
40.0000 mg | SUBCUTANEOUS | Status: DC
  Administered 2012-11-09: 40 mg via SUBCUTANEOUS
  Filled 2012-11-09: qty 0.4

## 2012-11-09 MED ORDER — HYDROXYZINE HCL 25 MG PO TABS: 25.0000 mg | ORAL_TABLET | Freq: Three times a day (TID) | ORAL | Status: DC | PRN

## 2012-11-09 MED ORDER — OXYCODONE HCL 5 MG PO TABS
5.0000 mg | ORAL_TABLET | Freq: Once | ORAL | Status: AC
  Administered 2012-11-09: 5 mg via ORAL

## 2012-11-09 MED ORDER — METOCLOPRAMIDE HCL 10 MG PO TABS: 5.0000 mg | ORAL_TABLET | Freq: Three times a day (TID) | ORAL | Status: DC | PRN

## 2012-11-09 MED ORDER — AMLODIPINE BESYLATE 5 MG PO TABS
10.0000 mg | ORAL_TABLET | Freq: Every day | ORAL | Status: DC
  Administered 2012-11-10: 10 mg via ORAL
  Filled 2012-11-09: qty 2

## 2012-11-09 MED ORDER — METHOCARBAMOL 100 MG/ML IJ SOLN
500.0000 mg | Freq: Four times a day (QID) | INTRAVENOUS | Status: DC | PRN
  Administered 2012-11-09: 500 mg via INTRAVENOUS
  Filled 2012-11-09: qty 5

## 2012-11-09 MED ORDER — ALBUTEROL SULFATE (5 MG/ML) 0.5% IN NEBU
2.5000 mg | INHALATION_SOLUTION | Freq: Four times a day (QID) | RESPIRATORY_TRACT | Status: DC
  Administered 2012-11-09: 2.5 mg via RESPIRATORY_TRACT

## 2012-11-09 MED ORDER — SUCCINYLCHOLINE CHLORIDE 20 MG/ML IJ SOLN
INTRAMUSCULAR | Status: AC
  Filled 2012-11-09: qty 1

## 2012-11-09 MED ORDER — GLIPIZIDE 5 MG PO TABS
10.0000 mg | ORAL_TABLET | Freq: Every day | ORAL | Status: DC
  Administered 2012-11-09 – 2012-11-10 (×2): 10 mg via ORAL
  Filled 2012-11-09 (×2): qty 2

## 2012-11-09 MED ORDER — PROPOFOL 10 MG/ML IV BOLUS
INTRAVENOUS | Status: DC | PRN
  Administered 2012-11-09: 150 mg via INTRAVENOUS

## 2012-11-09 MED ORDER — IPRATROPIUM BROMIDE 0.02 % IN SOLN
0.5000 mg | Freq: Four times a day (QID) | RESPIRATORY_TRACT | Status: DC
  Administered 2012-11-09: 0.5 mg via RESPIRATORY_TRACT
  Filled 2012-11-09: qty 2.5

## 2012-11-09 MED ORDER — FENTANYL CITRATE 0.05 MG/ML IJ SOLN
INTRAMUSCULAR | Status: DC | PRN
  Administered 2012-11-09 (×7): 50 ug via INTRAVENOUS

## 2012-11-09 MED ORDER — GLYCOPYRROLATE 0.2 MG/ML IJ SOLN
INTRAMUSCULAR | Status: DC | PRN
  Administered 2012-11-09: .4 mg via INTRAVENOUS

## 2012-11-09 MED ORDER — METOCLOPRAMIDE HCL 5 MG/ML IJ SOLN: 5.0000 mg | Freq: Three times a day (TID) | INTRAMUSCULAR | Status: DC | PRN

## 2012-11-09 MED ORDER — ASPIRIN EC 81 MG PO TBEC
81.0000 mg | DELAYED_RELEASE_TABLET | Freq: Every morning | ORAL | Status: DC
  Administered 2012-11-10: 81 mg via ORAL
  Filled 2012-11-09: qty 1

## 2012-11-09 MED ORDER — LACTATED RINGERS IV SOLN
INTRAVENOUS | Status: DC
  Administered 2012-11-09: 1000 mL via INTRAVENOUS

## 2012-11-09 MED ORDER — KETOROLAC TROMETHAMINE 30 MG/ML IJ SOLN
INTRAMUSCULAR | Status: AC
  Filled 2012-11-09: qty 1

## 2012-11-09 MED ORDER — ROCURONIUM BROMIDE 100 MG/10ML IV SOLN
INTRAVENOUS | Status: DC | PRN
  Administered 2012-11-09: 20 mg via INTRAVENOUS
  Administered 2012-11-09: 30 mg via INTRAVENOUS
  Administered 2012-11-09: 10 mg via INTRAVENOUS

## 2012-11-09 MED ORDER — FENTANYL CITRATE 0.05 MG/ML IJ SOLN
25.0000 ug | INTRAMUSCULAR | Status: DC | PRN
  Administered 2012-11-09 (×4): 50 ug via INTRAVENOUS

## 2012-11-09 MED ORDER — MIDAZOLAM HCL 2 MG/2ML IJ SOLN
INTRAMUSCULAR | Status: AC
  Filled 2012-11-09: qty 2

## 2012-11-09 MED ORDER — CHLORHEXIDINE GLUCONATE 4 % EX LIQD: 60.0000 mL | Freq: Once | CUTANEOUS | Status: DC

## 2012-11-09 MED ORDER — SPIRONOLACTONE 25 MG PO TABS
50.0000 mg | ORAL_TABLET | Freq: Every day | ORAL | Status: DC
  Administered 2012-11-09 – 2012-11-10 (×2): 50 mg via ORAL
  Filled 2012-11-09 (×2): qty 2

## 2012-11-09 MED ORDER — EPHEDRINE SULFATE 50 MG/ML IJ SOLN
INTRAMUSCULAR | Status: DC | PRN
  Administered 2012-11-09 (×2): 5 mg via INTRAVENOUS

## 2012-11-09 MED ORDER — PROPOFOL 10 MG/ML IV EMUL
INTRAVENOUS | Status: AC
  Filled 2012-11-09: qty 20

## 2012-11-09 MED ORDER — ACETAMINOPHEN 325 MG PO TABS: 650.0000 mg | ORAL_TABLET | Freq: Four times a day (QID) | ORAL | Status: DC | PRN

## 2012-11-09 MED ORDER — SODIUM CHLORIDE 0.9 % IV SOLN
INTRAVENOUS | Status: DC
  Administered 2012-11-10: via INTRAVENOUS

## 2012-11-09 MED ORDER — ONDANSETRON HCL 4 MG/2ML IJ SOLN
INTRAMUSCULAR | Status: AC
  Filled 2012-11-09: qty 2

## 2012-11-09 MED ORDER — CEFAZOLIN SODIUM-DEXTROSE 2-3 GM-% IV SOLR
2.0000 g | INTRAVENOUS | Status: AC
  Administered 2012-11-09: 2 g via INTRAVENOUS

## 2012-11-09 MED ORDER — SUCCINYLCHOLINE CHLORIDE 20 MG/ML IJ SOLN
INTRAMUSCULAR | Status: DC | PRN
  Administered 2012-11-09: 140 mg via INTRAVENOUS

## 2012-11-09 MED ORDER — OXYCODONE HCL 5 MG PO TABS
ORAL_TABLET | ORAL | Status: AC
  Filled 2012-11-09: qty 1

## 2012-11-09 MED ORDER — ALUM & MAG HYDROXIDE-SIMETH 200-200-20 MG/5ML PO SUSP: 30.0000 mL | ORAL | Status: DC | PRN

## 2012-11-09 MED ORDER — ALBUTEROL SULFATE (5 MG/ML) 0.5% IN NEBU
2.5000 mg | INHALATION_SOLUTION | Freq: Four times a day (QID) | RESPIRATORY_TRACT | Status: DC
  Filled 2012-11-09: qty 0.5

## 2012-11-09 MED ORDER — KETOROLAC TROMETHAMINE 30 MG/ML IJ SOLN
30.0000 mg | Freq: Once | INTRAMUSCULAR | Status: AC
  Administered 2012-11-09: 30 mg via INTRAVENOUS

## 2012-11-09 MED ORDER — FENTANYL CITRATE 0.05 MG/ML IJ SOLN
INTRAMUSCULAR | Status: AC
  Filled 2012-11-09: qty 2

## 2012-11-09 MED ORDER — ONDANSETRON HCL 4 MG/2ML IJ SOLN: 4.0000 mg | Freq: Once | INTRAMUSCULAR | Status: DC | PRN

## 2012-11-09 MED ORDER — HYDROMORPHONE HCL PF 1 MG/ML IJ SOLN
0.5000 mg | INTRAMUSCULAR | Status: DC | PRN
  Administered 2012-11-10: 1 mg via INTRAVENOUS
  Filled 2012-11-09: qty 1

## 2012-11-09 MED ORDER — LIDOCAINE HCL (PF) 1 % IJ SOLN
INTRAMUSCULAR | Status: AC
  Filled 2012-11-09: qty 5

## 2012-11-09 MED ORDER — IPRATROPIUM-ALBUTEROL 0.5-2.5 (3) MG/3ML IN SOLN: 3.0000 mL | Freq: Four times a day (QID) | RESPIRATORY_TRACT | Status: DC

## 2012-11-09 MED ORDER — LIDOCAINE HCL (CARDIAC) 10 MG/ML IV SOLN
INTRAVENOUS | Status: DC | PRN
  Administered 2012-11-09: 20 mg via INTRAVENOUS

## 2012-11-09 MED ORDER — DEXAMETHASONE SODIUM PHOSPHATE 4 MG/ML IJ SOLN
INTRAMUSCULAR | Status: AC
  Filled 2012-11-09: qty 1

## 2012-11-09 MED ORDER — IPRATROPIUM BROMIDE 0.02 % IN SOLN
0.5000 mg | Freq: Four times a day (QID) | RESPIRATORY_TRACT | Status: DC
  Administered 2012-11-09 – 2012-11-10 (×3): 0.5 mg via RESPIRATORY_TRACT
  Filled 2012-11-09 (×3): qty 2.5

## 2012-11-09 MED ORDER — BUPIVACAINE-EPINEPHRINE PF 0.5-1:200000 % IJ SOLN
INTRAMUSCULAR | Status: DC | PRN
  Administered 2012-11-09 (×2): 30 mL

## 2012-11-09 MED ORDER — DIPHENHYDRAMINE HCL 12.5 MG/5ML PO ELIX: 12.5000 mg | ORAL_SOLUTION | ORAL | Status: DC | PRN

## 2012-11-09 SURGICAL SUPPLY — 63 items
ANCHOR PEEK 5.5MM (Anchor) ×1 IMPLANT
ANCHOR SUT SCREW SPEED 6.5 (Screw) ×2 IMPLANT
BIT DRILL 2.0MX128MM (BIT) ×1 IMPLANT
BLADE HEX COATED 2.75 (ELECTRODE) ×2 IMPLANT
BLADE OSC/SAGITTAL MD 9X18.5 (BLADE) ×2 IMPLANT
BLADE SURG 15 STRL LF DISP TIS (BLADE) ×1 IMPLANT
BLADE SURG 15 STRL SS (BLADE) ×2
BLADE SURG SZ10 CARB STEEL (BLADE) ×2 IMPLANT
BNDG COHESIVE 4X5 TAN NS LF (GAUZE/BANDAGES/DRESSINGS) ×2 IMPLANT
CHLORAPREP W/TINT 26ML (MISCELLANEOUS) ×2 IMPLANT
CLOTH BEACON ORANGE TIMEOUT ST (SAFETY) ×2 IMPLANT
CONNECTOR  PERFECT PASSER ×1 IMPLANT
COVER LIGHT HANDLE STERIS (MISCELLANEOUS) ×4 IMPLANT
COVER MAYO STAND XLG (DRAPE) ×2 IMPLANT
COVER PROBE W GEL 5X96 (DRAPES) ×2 IMPLANT
DRAPE ORTHO 2.5IN SPLIT 77X108 (DRAPES) ×2 IMPLANT
DRAPE ORTHO SPLIT 77X108 STRL (DRAPES) ×6
DRAPE PROXIMA HALF (DRAPES) ×2 IMPLANT
DRESSING ALLEVYN BORDER 5X5 (GAUZE/BANDAGES/DRESSINGS) ×2 IMPLANT
DRESSING ALLEVYN BORDER HEEL (GAUZE/BANDAGES/DRESSINGS) ×1 IMPLANT
ELECT REM PT RETURN 9FT ADLT (ELECTROSURGICAL) ×2
ELECTRODE REM PT RTRN 9FT ADLT (ELECTROSURGICAL) ×1 IMPLANT
FORMALIN 10 PREFIL 120ML (MISCELLANEOUS) ×2 IMPLANT
GLOVE BIOGEL PI IND STRL 7.0 (GLOVE) IMPLANT
GLOVE BIOGEL PI IND STRL 7.5 (GLOVE) IMPLANT
GLOVE BIOGEL PI INDICATOR 7.0 (GLOVE) ×3
GLOVE BIOGEL PI INDICATOR 7.5 (GLOVE) ×1
GLOVE ECLIPSE 6.5 STRL STRAW (GLOVE) ×2 IMPLANT
GLOVE ECLIPSE 7.0 STRL STRAW (GLOVE) ×1 IMPLANT
GLOVE EXAM NITRILE LRG STRL (GLOVE) ×1 IMPLANT
GLOVE SKINSENSE NS SZ8.0 LF (GLOVE) ×1
GLOVE SKINSENSE STRL SZ8.0 LF (GLOVE) IMPLANT
GLOVE SS N UNI LF 8.5 STRL (GLOVE) ×2 IMPLANT
GOWN STRL REIN XL XLG (GOWN DISPOSABLE) ×7 IMPLANT
INST SET MINOR BONE (KITS) ×2 IMPLANT
KIT BLADEGUARD II DBL (SET/KITS/TRAYS/PACK) ×2 IMPLANT
KIT ROOM TURNOVER APOR (KITS) ×2 IMPLANT
MANIFOLD NEPTUNE II (INSTRUMENTS) ×2 IMPLANT
MARKER SKIN DUAL TIP RULER LAB (MISCELLANEOUS) ×2 IMPLANT
NDL HYPO 21X1.5 SAFETY (NEEDLE) ×1 IMPLANT
NDL MAYO 6 CRC TAPER PT (NEEDLE) IMPLANT
NEEDLE HYPO 21X1.5 SAFETY (NEEDLE) ×2 IMPLANT
NEEDLE MAYO 6 CRC TAPER PT (NEEDLE) ×2 IMPLANT
NS IRRIG 1000ML POUR BTL (IV SOLUTION) ×3 IMPLANT
PACK BASIC III (CUSTOM PROCEDURE TRAY) ×2
PACK SRG BSC III STRL LF ECLPS (CUSTOM PROCEDURE TRAY) ×1 IMPLANT
PAD ARMBOARD 7.5X6 YLW CONV (MISCELLANEOUS) ×2 IMPLANT
PENCIL HANDSWITCHING (ELECTRODE) ×2 IMPLANT
RASP SM TEAR CROSS CUT (RASP) ×1 IMPLANT
SET BASIN LINEN APH (SET/KITS/TRAYS/PACK) ×2 IMPLANT
SLING ARM FOAM STRAP XLG (SOFTGOODS) ×1 IMPLANT
SPONGE LAP 18X18 X RAY DECT (DISPOSABLE) ×3 IMPLANT
STAPLER VISISTAT 35W (STAPLE) ×1 IMPLANT
STOCKINETTE IMPERVIOUS LG (DRAPES) ×2 IMPLANT
SUT ETHIBOND NAB OS 4 #2 30IN (SUTURE) ×3 IMPLANT
SUT MON AB 0 CT1 (SUTURE) ×2 IMPLANT
SUT MON AB 2-0 CT1 36 (SUTURE) ×2 IMPLANT
SUT PERFECT PASSER SMRTSTITCH (MISCELLANEOUS) ×1 IMPLANT
SUT PERFECTPASSER WHITE CART (SUTURE) ×1 IMPLANT
SUT SMART STITCH CARTRIDGE (SUTURE) ×1 IMPLANT
SYR 30ML LL (SYRINGE) ×2 IMPLANT
SYR BULB IRRIGATION 50ML (SYRINGE) ×4 IMPLANT
YANKAUER SUCT 12FT TUBE ARGYLE (SUCTIONS) ×2 IMPLANT

## 2012-11-09 NOTE — Anesthesia Postprocedure Evaluation (Signed)
  Anesthesia Post-op Note  Patient: Tiffany Jacobs  Procedure(s) Performed: Procedure(s): ROTATOR CUFF REPAIR Right SHOULDER OPEN (Right) RIGHT SHOULDER ACROMIOPLASTY (Right) ACROMIO-CLAVICULAR JOINT REPAIR (Right)  Patient Location: PACU  Anesthesia Type:General  Level of Consciousness: awake, alert  and oriented  Airway and Oxygen Therapy: Patient Spontanous Breathing and Patient connected to face mask oxygen  Post-op Pain: none  Post-op Assessment: Post-op Vital signs reviewed, Patient's Cardiovascular Status Stable, Respiratory Function Stable, Patent Airway and No signs of Nausea or vomiting  Post-op Vital Signs: Reviewed and stable,133/55, 106,22, sats 100, 36.6  Complications: No apparent anesthesia complications

## 2012-11-09 NOTE — Op Note (Signed)
11/09/2012  1:14 PM  PATIENT:  Tiffany Jacobs  67 y.o. female  PRE-OPERATIVE DIAGNOSIS:  Right Rotator Cuff Tear  POST-OPERATIVE DIAGNOSIS:  Right Rotator Cuff Tear, Acromion-Clavicular joint arthritis  Massive rotator cuff tear chronic right shoulder supraspinatus subscapularis infraspinatus with retraction 3-1/2 cm approximately half way from the tuberosity to the glenoid, bulky a.c. joint arthrosis, subluxation of the biceps tendon.  PROCEDURE:  Procedure(s): ROTATOR CUFF REPAIR Right SHOULDER OPEN (Right) RIGHT SHOULDER ACROMIOPLASTY (Right) ACROMIO-CLAVICULAR JOINT REPAIR (Right)  Implants arthrosis care speech screw 6.5, x 2 implants, ArthroCare Spartan 5.5 one implant  SURGEON:  Surgeon(s) and Role:    * Stanley E Harrison, MD - Primary  PHYSICIAN ASSISTANT:   ASSISTANTS: betty ashley    ANESTHESIA:   general  EBL:  Total I/O In: 700 [I.V.:700] Out: 50 [Blood:50]  BLOOD ADMINISTERED:none  DRAINS: none   LOCAL MEDICATIONS USED:  MARCAINE 0.5% with epinephrine 60 cc total  SPECIMEN:  Source of Specimen:  Distal clavicle and distal acromion  DISPOSITION OF SPECIMEN:  PATHOLOGY  COUNTS:  YES  TOURNIQUET:  * No tourniquets in log *  DICTATION: .Dragon Dictation  PLAN OF CARE: To be determined  PATIENT DISPOSITION:  PACU - hemodynamically stable.   Delay start of Pharmacological VTE agent (>24hrs) due to surgical blood loss or risk of bleeding: not applicable  Details of procedure  Primary indication was pain and loss of function of the right shoulder  The patient was identified in the preop holding area and the surgical site was reviewed and confirmed and then marked. Chart review was completed. The patient was taken to the operating room for general anesthesia which was smooth. She was placed in the semi-modified beachchair position and the right arm was then prepped and draped sterilely. Timeout was completed.   The incision was made over the a.c.  joint and across the anterior acromion and between the middle and anterior third of the deltoid. Subcutaneous tissue was divided down to the deltoid fascia. The deltotrapezial musculoskeletal periosteal flap was established and dissected from the acromion and distal clavicle. The distal clavicle was excised. A bursectomy was performed. The retracted rotator cuff tear was inspected debrided.  Several sutures were placed in the rotator cuff to mobilize the cuff approximately distally anteriorly posteriorly superiorly and inferiorly. 3 #2 Ethibond sutures were placed between the supraspinatus and infraspinatus to close the posterior V-shaped portion of the tear  We then drilled holes in the tuberosity debrided the tuberosity and then placed 2 ArthroCare speech screw anchors and obtained an excellent repair without any tension.  We then placed a Spartan anchor anteriorly to prepare the subscapularis. This anchor had 2 sutures which were placed.  A decision had to be made of what to do with the biceps tendon which was subluxated from the bicipital groove. However the attachment at the superior glenoid was normal.  There was some disease in the tendon however based on the massive nature of this tear and the possibility of of not healing and potential need for a glenohumeral arthroplasty I decided to not tenodesis the biceps tendon to allow for some anterior stability  The wound was irrigated the deltoid trapezial musculoskeletal fascial flap was closed with #2 Ethibond. Subcutaneous tissue was closed with 0 Monocryl  30 cc of Marcaine was injected beneath the deltoid trapezial fascia  Skin was closed with staples sterile dressing was applied and a sling was placed.  

## 2012-11-09 NOTE — Evaluation (Signed)
Occupational Therapy Evaluation Patient Details Name: Tiffany Jacobs MRN: [id number] DOB: 08-Oct-1945 Today's Date: 11/09/2012 Time: 5621-3086 OT Time Calculation (min): 25 min OT Eval OT Assessment / Plan / Recommendation History of present illness Patient with traumatic right rotator cuff tear, s/p R RCR this date.          OT Assessment  All further OT needs can be met in the next venue of care    Follow Up Recommendations  Outpatient OT                          Precautions / Restrictions Precautions Precautions: Shoulder Type of Shoulder Precautions: sling at all times, PROM only x 6 weeks. Shoulder Interventions: Shoulder sling/immobilizer Required Braces or Orthoses: Sling   Pertinent Vitals/Pain n/a    ADL  ADL Comments: Educated on UE dressing:  don clothing on right arm prior to donning over head and on left arm.  When doffing clothing, doff left arm and hand prior to right arm to minimize discomfort and build Independence.      OT Diagnosis:   S/P R RCR OT Problem List:   pain, limited mobility in RUE. OT Treatment Interventions:   Patient education.   OT Goals(Current goals can be found in the care plan section) Acute Rehab OT Goals Patient Stated Goal: to gain full use of right arm. OT Goal Formulation: With patient  Visit Information  Last OT Received On: 11/09/12 History of Present Illness: Patient with traumatic right rotator cuff tear, s/p R RCR this date.        Prior Functioning     Home Living Family/patient expects to be discharged to:: Private residence Living Arrangements: Spouse/significant other Available Help at Discharge: Family Prior Function Level of Independence: Independent Communication Communication: No difficulties Dominant Hand: Right         Vision/Perception Vision - History Baseline Vision: No visual deficits Perception Perception: Within Functional Limits Praxis Praxis: Intact   Cognition   Cognition Arousal/Alertness: Awake/alert Behavior During Therapy: WFL for tasks assessed/performed Overall Cognitive Status: Within Functional Limits for tasks assessed    Extremity/Trunk Assessment Upper Extremity Assessment RUE Deficits / Details: RUE PROM is [facility]. LUE Deficits / Details: LUE AROM and strength is WFL. Cervical / Trunk Assessment Cervical / Trunk Assessment: Normal     Mobility Bed Mobility Details for Bed Mobility Assistance: I with leaning forward in bed to assist with sling donning.  able to bring feet in and out of bed independently.      Exercise Donning/doffing shirt without moving shoulder: Supervision/safety Donning/doffing sling/immobilizer: Maximal assistance Correct positioning of sling/immobilizer: Maximal assistance Pendulum exercises (written home exercise program): Supervision/safety ROM for elbow, wrist and digits of operated UE: Supervision/safety Sling wearing schedule (on at all times/off for ADL's): Supervision/safety   Balance     End of Session OT - End of Session Activity Tolerance: Patient tolerated treatment well Patient left: in bed;with call bell/phone within reach;with nursing/sitter in room Nurse Communication: Precautions  GO Functional Assessment Tool Used: clinical observation Functional Limitation: Self care Self Care Current Status (V7846): At least 40 percent but less than 60 percent impaired, limited or restricted Self Care Goal Status (N6295): At least 40 percent but less than 60 percent impaired, limited or restricted Self Care Discharge Status [phone]): At least 40 percent but less than 60 percent impaired, limited or restricted   Shirlean Mylar, OTR/L  11/09/2012, 5:29 PM

## 2012-11-09 NOTE — Anesthesia Procedure Notes (Signed)
Procedure Name: Intubation Date/Time: 11/09/2012 11:15 AM Performed by: Franco Nones Pre-anesthesia Checklist: Patient identified, Patient being monitored, Timeout performed, Emergency Drugs available and Suction available Patient Re-evaluated:Patient Re-evaluated prior to inductionOxygen Delivery Method: Circle System Utilized Preoxygenation: Pre-oxygenation with 100% oxygen Intubation Type: IV induction, Rapid sequence and Cricoid Pressure applied Laryngoscope Size: Miller and 2 Grade View: Grade I Tube type: Oral Tube size: 7.0 mm Number of attempts: 1 Airway Equipment and Method: stylet Placement Confirmation: ETT inserted through vocal cords under direct vision,  positive ETCO2 and breath sounds checked- equal and bilateral Secured at: 21 cm Tube secured with: Tape Dental Injury: Teeth and Oropharynx as per pre-operative assessment

## 2012-11-09 NOTE — Brief Op Note (Addendum)
11/09/2012  1:14 PM  PATIENT:  Tiffany Jacobs  67 y.o. female  PRE-OPERATIVE DIAGNOSIS:  Right Rotator Cuff Tear  POST-OPERATIVE DIAGNOSIS:  Right Rotator Cuff Tear, Acromion-Clavicular joint arthritis  Massive rotator cuff tear chronic right shoulder supraspinatus subscapularis infraspinatus with retraction 3-1/2 cm approximately half way from the tuberosity to the glenoid, bulky a.c. joint arthrosis, subluxation of the biceps tendon.  PROCEDURE:  Procedure(s): ROTATOR CUFF REPAIR Right SHOULDER OPEN (Right) RIGHT SHOULDER ACROMIOPLASTY (Right) ACROMIO-CLAVICULAR JOINT REPAIR (Right)  Implants arthrosis care speech screw 6.5, x 2 implants, ArthroCare Spartan 5.5 one implant  SURGEON:  Surgeon(s) and Role:    * Vickki Hearing, MD - Primary  PHYSICIAN ASSISTANT:   ASSISTANTS: betty ashley    ANESTHESIA:   general  EBL:  Total I/O In: 700 [I.V.:700] Out: 50 [Blood:50]  BLOOD ADMINISTERED:none  DRAINS: none   LOCAL MEDICATIONS USED:  MARCAINE 0.5% with epinephrine 60 cc total  SPECIMEN:  Source of Specimen:  Distal clavicle and distal acromion  DISPOSITION OF SPECIMEN:  PATHOLOGY  COUNTS:  YES  TOURNIQUET:  * No tourniquets in log *  DICTATION: .Dragon Dictation  PLAN OF CARE: To be determined  PATIENT DISPOSITION:  PACU - hemodynamically stable.   Delay start of Pharmacological VTE agent (>24hrs) due to surgical blood loss or risk of bleeding: not applicable  Details of procedure  Primary indication was pain and loss of function of the right shoulder  The patient was identified in the preop holding area and the surgical site was reviewed and confirmed and then marked. Chart review was completed. The patient was taken to the operating room for general anesthesia which was smooth. She was placed in the semi-modified beachchair position and the right arm was then prepped and draped sterilely. Timeout was completed.   The incision was made over the a.c.  joint and across the anterior acromion and between the middle and anterior third of the deltoid. Subcutaneous tissue was divided down to the deltoid fascia. The deltotrapezial musculoskeletal periosteal flap was established and dissected from the acromion and distal clavicle. The distal clavicle was excised. A bursectomy was performed. The retracted rotator cuff tear was inspected debrided.  Several sutures were placed in the rotator cuff to mobilize the cuff approximately distally anteriorly posteriorly superiorly and inferiorly. 3 #2 Ethibond sutures were placed between the supraspinatus and infraspinatus to close the posterior V-shaped portion of the tear  We then drilled holes in the tuberosity debrided the tuberosity and then placed 2 ArthroCare speech screw anchors and obtained an excellent repair without any tension.  We then placed a Spartan anchor anteriorly to prepare the subscapularis. This anchor had 2 sutures which were placed.  A decision had to be made of what to do with the biceps tendon which was subluxated from the bicipital groove. However the attachment at the superior glenoid was normal.  There was some disease in the tendon however based on the massive nature of this tear and the possibility of of not healing and potential need for a glenohumeral arthroplasty I decided to not tenodesis the biceps tendon to allow for some anterior stability  The wound was irrigated the deltoid trapezial musculoskeletal fascial flap was closed with #2 Ethibond. Subcutaneous tissue was closed with 0 Monocryl  30 cc of Marcaine was injected beneath the deltoid trapezial fascia  Skin was closed with staples sterile dressing was applied and a sling was placed.

## 2012-11-09 NOTE — Anesthesia Preprocedure Evaluation (Signed)
Anesthesia Evaluation  Patient identified by MRN, date of birth, ID band Patient awake    Reviewed: Allergy & Precautions, H&P , NPO status , Patient's Chart, lab work & pertinent test results  Airway Mallampati: II TM Distance: >3 FB     Dental  (+) Partial Upper and Partial Lower   Pulmonary asthma (allergies) , former smoker,  breath sounds clear to auscultation        Cardiovascular hypertension, Pt. on medications Rhythm:Regular Rate:Normal     Neuro/Psych  Neuromuscular disease    GI/Hepatic GERD-  Medicated and Controlled,  Endo/Other  diabetes, Type 2, Oral Hypoglycemic Agents  Renal/GU      Musculoskeletal   Abdominal   Peds  Hematology   Anesthesia Other Findings   Reproductive/Obstetrics                           Anesthesia Physical Anesthesia Plan  ASA: III  Anesthesia Plan: General   Post-op Pain Management:    Induction: Intravenous, Rapid sequence and Cricoid pressure planned  Airway Management Planned: Oral ETT  Additional Equipment:   Intra-op Plan:   Post-operative Plan: Extubation in OR  Informed Consent: I have reviewed the patients History and Physical, chart, labs and discussed the procedure including the risks, benefits and alternatives for the proposed anesthesia with the patient or authorized representative who has indicated his/her understanding and acceptance.     Plan Discussed with:   Anesthesia Plan Comments:         Anesthesia Quick Evaluation

## 2012-11-09 NOTE — Interval H&P Note (Signed)
History and Physical Interval Note:  11/09/2012 10:56 AM  Tiffany Jacobs  has presented today for surgery, with the diagnosis of Right Rotator Cuff Tear  The various methods of treatment have been discussed with the patient and family. After consideration of risks, benefits and other options for treatment, the patient has consented to  Procedure(s): ROTATOR CUFF REPAIR SHOULDER OPEN (Right) as a surgical intervention .  The patient's history has been reviewed, patient examined, no change in status, stable for surgery.  I have reviewed the patient's chart and labs.  Questions were answered to the patient's satisfaction.     Fuller Canada

## 2012-11-09 NOTE — Transfer of Care (Signed)
Immediate Anesthesia Transfer of Care Note  Patient: Tiffany Jacobs  Procedure(s) Performed: Procedure(s): ROTATOR CUFF REPAIR Right SHOULDER OPEN (Right) RIGHT SHOULDER ACROMIOPLASTY (Right) ACROMIO-CLAVICULAR JOINT REPAIR (Right)  Patient Location: PACU  Anesthesia Type:General  Level of Consciousness: awake  Airway & Oxygen Therapy: Patient Spontanous Breathing and Patient connected to face mask oxygen  Post-op Assessment: Report given to PACU RN  Post vital signs: Reviewed  Complications: No apparent anesthesia complications

## 2012-11-09 NOTE — Interval H&P Note (Signed)
History and Physical Interval Note:  11/09/2012 10:56 AM  Tiffany Jacobs  has presented today for surgery, with the diagnosis of Right Rotator Cuff Tear  The various methods of treatment have been discussed with the patient and family. After consideration of risks, benefits and other options for treatment, the patient has consented to  Procedure(s): ROTATOR CUFF REPAIR SHOULDER OPEN (Right) as a surgical intervention .  The patient's history has been reviewed, patient examined, no change in status, stable for surgery.  I have reviewed the patient's chart and labs.  Questions were answered to the patient's satisfaction.     Kazim Corrales   

## 2012-11-10 LAB — GLUCOSE, CAPILLARY

## 2012-11-10 MED ORDER — SIMVASTATIN 20 MG PO TABS: 40.0000 mg | ORAL_TABLET | Freq: Every day | ORAL | Status: DC

## 2012-11-10 MED ORDER — TIZANIDINE HCL 2 MG PO TABS: 2.0000 mg | ORAL_TABLET | Freq: Four times a day (QID) | ORAL | Status: DC | PRN

## 2012-11-10 MED ORDER — ATORVASTATIN CALCIUM 20 MG PO TABS: 20.0000 mg | ORAL_TABLET | Freq: Every day | ORAL | Status: DC

## 2012-11-10 MED ORDER — HYDROCODONE-ACETAMINOPHEN 10-325 MG PO TABS: 1.0000 | ORAL_TABLET | ORAL | Status: DC | PRN

## 2012-11-10 NOTE — Progress Notes (Signed)
Discharge instructions and prescriptions given, verbalized understanding, out in stable condition with staff via w/c. 

## 2012-11-10 NOTE — Discharge Summary (Signed)
Physician Discharge Summary  Patient ID: DERYA DETTMANN MRN: [id number] DOB/AGE: 11/09/1945 67 y.o.  Admit date: 11/09/2012 Discharge date: 11/10/2012  Admission Diagnoses: right rotator cuff tear oa shoulder   Discharge Diagnoses: same   Active Problems:   * No active hospital problems. *   Discharged Condition: good  Hospital Course: surgery 9/5, overnight observation , discharge  9/6  Consults: None  Open right rotator cuff repair + excision distal clavicle   Discharge Exam: Blood pressure 111/66, pulse 104, temperature 98.1 F (36.7 C), temperature source Oral, resp. rate 20, height 5' 2.5" (1.588 m), weight 223 lb 12.3 oz (101.5 kg), SpO2 91.00%. General appearance: alert Neuro vascual r exam normal   Disposition: 01-Home or Self Care  Discharge Orders   Future Appointments Provider Department Dept Phone   11/15/2012 3:30 PM Vickki Hearing, MD [facility] [phone]   01/18/2013 9:45 AM Kerri Perches, MD [facility] [phone]   Future Orders Complete By Expires   Diet - low sodium heart healthy  As directed    Discharge instructions  As directed    Comments:     Wear sling full time   Driving Restrictions  As directed    Comments:     No driving   Increase activity slowly  As directed    Lifting restrictions  As directed    Comments:     No lifting right arm       Medication List    STOP taking these medications       HYDROcodone-acetaminophen 5-325 MG per tablet  Commonly known as:  NORCO/VICODIN  Replaced by:  HYDROcodone-acetaminophen 10-325 MG per tablet      TAKE these medications       albuterol 108 (90 BASE) MCG/ACT inhaler  Commonly known as:  PROVENTIL HFA;VENTOLIN HFA  Inhale 2 puffs into the lungs every 6 (six) hours as needed for wheezing.     amLODipine 10 MG tablet  Commonly known as:  NORVASC  Take 1 tablet (10 mg total) by mouth daily.     ASPIRIN LOW DOSE 81 MG EC tablet   Generic drug:  aspirin  Take 81 mg by mouth every morning.     Dapagliflozin Propanediol 5 MG Tabs  Commonly known as:  FARXIGA  Take 1 tablet by mouth daily.     fluticasone 50 MCG/ACT nasal spray  Commonly known as:  FLONASE  Place 2 sprays into the nose daily.     glipiZIDE 10 MG tablet  Commonly known as:  GLUCOTROL  Take 1 tablet (10 mg total) by mouth daily.     HYDROcodone-acetaminophen 10-325 MG per tablet  Commonly known as:  NORCO  Take 1 tablet by mouth every 4 (four) hours as needed for pain.     hydrOXYzine 25 MG tablet  Commonly known as:  ATARAX/VISTARIL  TAKE ONE TABLET BY MOUTH AT NIGHT FOR ITCHING.     ibuprofen 200 MG tablet  Commonly known as:  ADVIL,MOTRIN  Take 200 mg by mouth every 6 (six) hours as needed for pain.     ipratropium-albuterol 0.5-2.5 (3) MG/3ML Soln  Commonly known as:  DUONEB  Take 3 mLs by nebulization. Inhale 1 vial thru nebulizer every 6-8 hours as needed for wheezing     metFORMIN 1000 MG tablet  Commonly known as:  GLUCOPHAGE  Take 1 tablet (1,000 mg total) by mouth 2 (two) times daily with a meal.     pravastatin 40  MG tablet  Commonly known as:  PRAVACHOL  TAKE (1) TABLET BY MOUTH AT BEDTIME FOR CHOLESTEROL.     spironolactone 50 MG tablet  Commonly known as:  ALDACTONE  Take 1 tablet (50 mg total) by mouth daily.     tiZANidine 2 MG tablet  Commonly known as:  ZANAFLEX  Take 1 tablet (2 mg total) by mouth every 6 (six) hours as needed.     zolpidem 10 MG tablet  Commonly known as:  AMBIEN  TAKE (1) TABLET BY MOUTH AT BEDTIME AS NEEDED FOR SLEEP.           Follow-up Information   Follow up with Fuller Canada, MD.   Specialties:  Orthopedic Surgery, Radiology   Contact information:   [address] [place] [address] [phone]       Signed: Fuller Canada 11/10/2012, 9:03 AM

## 2012-11-12 ENCOUNTER — Encounter (HOSPITAL_COMMUNITY): Payer: Self-pay | Admitting: Orthopedic Surgery

## 2012-11-12 NOTE — Progress Notes (Signed)
OIB-UR Chart Review Completed

## 2012-11-15 ENCOUNTER — Ambulatory Visit (INDEPENDENT_AMBULATORY_CARE_PROVIDER_SITE_OTHER): Payer: Medicare Other | Admitting: Orthopedic Surgery

## 2012-11-15 VITALS — BP 131/85 | Ht 62.5 in | Wt 221.0 lb

## 2012-11-15 DIAGNOSIS — Z9889 Other specified postprocedural states: Secondary | ICD-10-CM

## 2012-11-15 NOTE — Patient Instructions (Signed)
Wear sling cannot move arm away from the body

## 2012-11-15 NOTE — Progress Notes (Signed)
Patient ID: Tiffany Jacobs, female   DOB: [birth year], 67 y.o.   MRN: [id number]   Chief Complaint  Patient presents with  . Follow-up    Post op 1 Right Rotator Cuff DOS 11/09/12    Postop visit right rotator cuff repair supraspinatus subscapularis and infraspinatus distal clavicle excision  Her incision looks good  I placed her in a sling shot  Come back next week to remove the staples  She will rest and this device for good 6 weeks before we will start therapy

## 2012-11-21 ENCOUNTER — Telehealth: Payer: Self-pay | Admitting: Family Medicine

## 2012-11-21 NOTE — Telephone Encounter (Signed)
Note written, see previous

## 2012-11-21 NOTE — Telephone Encounter (Signed)
Samples received.

## 2012-11-21 NOTE — Telephone Encounter (Signed)
Would you like for the rx to be sent to the pharmacy? I do believe she is the patient that insurance did not cover?

## 2012-11-21 NOTE — Telephone Encounter (Signed)
pls request samples for pt iof insurance does not cover , she will let you know if it does,  Keep her on a constant list for hte med pls.She has been unable to qualify through hD for help repeatedly Unless rep states no samples she needs this continually [facility]

## 2012-11-21 NOTE — Telephone Encounter (Signed)
Please advise 

## 2012-11-22 ENCOUNTER — Encounter: Payer: Self-pay | Admitting: Orthopedic Surgery

## 2012-11-22 ENCOUNTER — Ambulatory Visit (INDEPENDENT_AMBULATORY_CARE_PROVIDER_SITE_OTHER): Payer: Medicare Other | Admitting: Orthopedic Surgery

## 2012-11-22 VITALS — BP 133/74 | Ht 62.5 in | Wt 221.0 lb

## 2012-11-22 DIAGNOSIS — Z9889 Other specified postprocedural states: Secondary | ICD-10-CM | POA: Insufficient documentation

## 2012-11-22 MED ORDER — TIZANIDINE HCL 2 MG PO TABS: 2.0000 mg | ORAL_TABLET | Freq: Four times a day (QID) | ORAL | Status: DC | PRN

## 2012-11-22 MED ORDER — HYDROCODONE-ACETAMINOPHEN 10-325 MG PO TABS: 1.0000 | ORAL_TABLET | ORAL | Status: DC | PRN

## 2012-11-22 NOTE — Patient Instructions (Addendum)
Wear sling 4 more weeks

## 2012-11-22 NOTE — Progress Notes (Signed)
Patient ID: Tiffany Jacobs, female   DOB: [birth year], 67 y.o.   MRN: [id number]  Chief Complaint  Patient presents with  . Follow-up    Post op 2 right Rotator Cuff Repair DOS 11/09/12    Open rotator cuff repair distal clavicle excision status post 3 tendon cuff tear doing well and looks good. Continue medication swelling, 4 weeks and we can start therapy

## 2012-11-26 ENCOUNTER — Other Ambulatory Visit: Payer: Self-pay

## 2012-11-26 ENCOUNTER — Other Ambulatory Visit: Payer: Self-pay | Admitting: Family Medicine

## 2012-11-26 MED ORDER — DAPAGLIFLOZIN PROPANEDIOL 5 MG PO TABS: 1.0000 | ORAL_TABLET | Freq: Every day | ORAL | Status: DC

## 2012-12-04 ENCOUNTER — Other Ambulatory Visit: Payer: Self-pay | Admitting: *Deleted

## 2012-12-04 DIAGNOSIS — Z9889 Other specified postprocedural states: Secondary | ICD-10-CM

## 2012-12-04 MED ORDER — TIZANIDINE HCL 2 MG PO TABS: 2.0000 mg | ORAL_TABLET | Freq: Four times a day (QID) | ORAL | Status: DC | PRN

## 2012-12-20 ENCOUNTER — Ambulatory Visit (INDEPENDENT_AMBULATORY_CARE_PROVIDER_SITE_OTHER): Payer: Medicare Other | Admitting: Orthopedic Surgery

## 2012-12-20 ENCOUNTER — Telehealth: Payer: Self-pay | Admitting: Orthopedic Surgery

## 2012-12-20 VITALS — BP 146/88 | Ht 62.5 in | Wt 221.0 lb

## 2012-12-20 DIAGNOSIS — Z9889 Other specified postprocedural states: Secondary | ICD-10-CM

## 2012-12-20 MED ORDER — HYDROCODONE-ACETAMINOPHEN 7.5-325 MG PO TABS: 1.0000 | ORAL_TABLET | ORAL | Status: DC | PRN

## 2012-12-20 MED ORDER — TIZANIDINE HCL 2 MG PO TABS: 2.0000 mg | ORAL_TABLET | Freq: Four times a day (QID) | ORAL | Status: DC | PRN

## 2012-12-20 NOTE — Patient Instructions (Signed)
-   Start therapy

## 2012-12-20 NOTE — Telephone Encounter (Signed)
Dr. Romeo Apple said patient can drive and remove sling. I tried to return call to patient, however, she was asleep. I left message for her to call back.

## 2012-12-20 NOTE — Progress Notes (Signed)
Patient ID: Tiffany Jacobs, female   DOB: 07-23-1945, 67 y.o.   MRN: [id number]  Chief Complaint  Patient presents with  . Follow-up    4 week recheck post op right rotator cuff repair DOS 11/09/12    Blood pressure 146/88, height 5' 2.5" (1.588 m), weight 221 lb (100.245 kg). Encounter Diagnosis  Name Primary?  . S/P rotator cuff repair Yes   6 weeks status post rotator cuff repair initial 6 weeks of rest and swelling with no motion activities  Her wound looks clean she is neurovascularly intact she can start occupational therapy come back in 6 weeks  Meds ordered this encounter  Medications  . HYDROcodone-acetaminophen (NORCO) 7.5-325 MG per tablet    Sig: Take 1 tablet by mouth every 4 (four) hours as needed for pain.    Dispense:  90 tablet    Refill:  0  . tiZANidine (ZANAFLEX) 2 MG tablet    Sig: Take 1 tablet (2 mg total) by mouth every 6 (six) hours as needed.    Dispense:  90 tablet    Refill:  2

## 2012-12-26 ENCOUNTER — Ambulatory Visit (HOSPITAL_COMMUNITY): Payer: Medicare Other | Admitting: Occupational Therapy

## 2012-12-27 ENCOUNTER — Ambulatory Visit (INDEPENDENT_AMBULATORY_CARE_PROVIDER_SITE_OTHER): Payer: Medicare Other | Admitting: Otolaryngology

## 2012-12-28 ENCOUNTER — Ambulatory Visit (HOSPITAL_COMMUNITY): Payer: Medicare Other | Admitting: Occupational Therapy

## 2013-01-04 ENCOUNTER — Ambulatory Visit: Payer: Medicare Other | Admitting: Family Medicine

## 2013-01-07 ENCOUNTER — Ambulatory Visit (HOSPITAL_COMMUNITY)
Admission: RE | Admit: 2013-01-07 | Discharge: 2013-01-07 | Disposition: A | Discharge status: Home / Self Care | Payer: Medicare Other | Source: Ambulatory Visit | Attending: Orthopedic Surgery | Admitting: Orthopedic Surgery

## 2013-01-07 DIAGNOSIS — IMO0001 Reserved for inherently not codable concepts without codable children: Secondary | ICD-10-CM | POA: Insufficient documentation

## 2013-01-07 DIAGNOSIS — I10 Essential (primary) hypertension: Secondary | ICD-10-CM | POA: Insufficient documentation

## 2013-01-07 DIAGNOSIS — M25519 Pain in unspecified shoulder: Secondary | ICD-10-CM | POA: Insufficient documentation

## 2013-01-07 DIAGNOSIS — E119 Type 2 diabetes mellitus without complications: Secondary | ICD-10-CM | POA: Insufficient documentation

## 2013-01-07 DIAGNOSIS — M25619 Stiffness of unspecified shoulder, not elsewhere classified: Secondary | ICD-10-CM | POA: Insufficient documentation

## 2013-01-07 LAB — COMPLETE METABOLIC PANEL WITH GFR
Albumin: 3.8 g/dL (ref 3.5–5.2)
BUN: 12 mg/dL (ref 6–23)
CO2: 28 mEq/L (ref 19–32)
Calcium: 9.4 mg/dL (ref 8.4–10.5)
Chloride: 100 mEq/L (ref 96–112)
GFR, Est African American: 94 mL/min
GFR, Est Non African American: 81 mL/min
Glucose, Bld: 155 mg/dL — ABNORMAL HIGH (ref 70–99)
Potassium: 4.6 mEq/L (ref 3.5–5.3)
Total Protein: 6.7 g/dL (ref 6.0–8.3)

## 2013-01-07 LAB — HEMOGLOBIN A1C: Hgb A1c MFr Bld: 7.6 % — ABNORMAL HIGH (ref <5.7)

## 2013-01-07 NOTE — Evaluation (Signed)
Occupational Therapy Evaluation  Patient Details  Name: Tiffany Jacobs MRN: [id number] Date of Birth: Dec 28, 1945  Today's Date: 01/07/2013 Time: 4098-1191 OT Time Calculation (min): 32 min Evaluation 1315-1347 (32')  Visit#: 1 of 18  Re-eval: 02/05/13  Assessment Diagnosis: Right Rotator Cuff Repair Surgical Date: 11/09/12 Next MD Visit: end of November 2014 Prior Therapy: none   Authorization: [facility] Medicare   Authorization Time Period: Before 10th visit  Authorization Visit#: 1 of 18   Past Medical History:  Past Medical History  Diagnosis Date  . Hypertension   . Diabetes mellitus, type 2   . Sciatica   . Low back pain   . Obesity   . Seasonal allergies   . Asthma   . RAD (reactive airway disease)   . GERD (gastroesophageal reflux disease)   . Arthritis    Past Surgical History:  Past Surgical History  Procedure Laterality Date  . Total knee arthroplasty left  02-01-05    Dr. Romeo Apple  . Tubal ligation  1970  . Left breast biopsy for benign disease    . Shoulder open rotator cuff repair Right 11/09/2012    Procedure: ROTATOR CUFF REPAIR Right SHOULDER OPEN;  Surgeon: Vickki Hearing, MD;  Location: [facility];  Service: Orthopedics;  Laterality: Right;  . Shoulder acromioplasty Right 11/09/2012    Procedure: RIGHT SHOULDER ACROMIOPLASTY;  Surgeon: Vickki Hearing, MD;  Location: [facility];  Service: Orthopedics;  Laterality: Right;  . Acromio-clavicular joint repair Right 11/09/2012    Procedure: ACROMIO-CLAVICULAR JOINT REPAIR;  Surgeon: Vickki Hearing, MD;  Location: [facility];  Service: Orthopedics;  Laterality: Right;    Subjective Symptoms/Limitations Symptoms: S:  I fell and hurt my arm back in January and it just has not been the same since...  Pertinent History: Patient is a 67yo right handed female who presents for therapy this date s/p Right Rotator Cuff Repair. Patient states she fell in January with resulting right shoulder injury. Patient states she  had some 'blood issues' which prevented her from having surgery initially. When she returned to MD in August, she was referred to Dr Romeo Apple for said surg. She presents with decreased range, increased complaint of pain and decreased functioanl use of dominant RUE> Limitations: Shoulder  Patient Stated Goals: i would like for my arm to be 100% Pain Assessment Currently in Pain?: Yes Pain Score: 7  Pain Location: Shoulder Pain Orientation: Right Pain Type: Acute pain Multiple Pain Sites: No  Precautions/Restrictions  Precautions Precautions: Shoulder Precaution Comments: patient states she has been out of her sling since 12/22/2012 Restrictions Weight Bearing Restrictions: No  Balance Screening Balance Screen Has the patient fallen in the past 6 months: Yes (Patient's fall was in January 2014) How many times?: one - patient states she got tangled up in her bed covers and fell ~3 weeks ago  Has the patient had a decrease in activity level because of a fear of falling? : No Is the patient reluctant to leave their home because of a fear of falling? : No  Prior Functioning  Home Living Family/patient expects to be discharged to:: Private residence Living Arrangements: Spouse/significant other (husband ) Available Help at Discharge: Family Type of Home: House Home Access: Stairs to enter [employer] of Steps: 3 Entrance Stairs-Rails: Left Home Layout: One level;Laundry or work area in basement Prior Function Level of Independence: Needs assistance with ADLs;Needs assistance with homemaking Driving: Yes Leisure: Hobbies-yes (Comment) Comments: likes to write and computer games   Assessment  ADL/Vision/Perception ADL ADL Comments: Patient states she needs assistance with her bra, difficulty pulling pants over hips, taking care of her hair.  Dominant Hand: Right Vision - History Baseline Vision: Wears glasses all the time  Cognition/Observation Cognition Overall  Cognitive Status: Within Functional Limits for tasks assessed Arousal/Alertness: Awake/alert Orientation Level: Oriented X4 Observation/Other Assessments Observations: patient arrives this date with no sling in place; minimal guarding posture of RUE  Other Assessments: patient presents with well healed incision on anterior shoulder    Additional Assessments RUE PROM (degrees) Right Shoulder Extension: 42 Degrees Right Shoulder Flexion: 36 Degrees Right Shoulder ABduction: 48 Degrees Right Shoulder Internal Rotation: 65 Degrees Right Shoulder External Rotation: 60 Degrees RUE Strength Right Shoulder Flexion: 2-/5 Right Shoulder Extension: 3/5 Right Shoulder ABduction: 2-/5 Right Shoulder Internal Rotation: 2+/5 Right Shoulder External Rotation: 2-/5 Palpation Palpation: mod-max  fascial restrictions throughout right upper arm, trapezious and scapularis    Occupational Therapy Assessment and Plan OT Assessment and Plan Clinical Impression Statement: A: Patient  presents with decreased P/AROM, strength and increased pain, fascial restrictions all limiting functional use of dominant RUE s/p right rotator cuff repair.   Pt will benefit from skilled therapeutic intervention in order to improve on the following deficits: Impaired UE functional use;Increased fascial restricitons;Decreased activity tolerance;Decreased range of motion;Decreased strength;Pain Rehab Potential: Good OT Frequency: Min 3X/week OT Duration: 6 weeks OT Treatment/Interventions: Self-care/ADL training;Therapeutic activities;Therapeutic exercise;Patient/family education;Manual therapy;Modalities   Goals Home Exercise Program Pt/caregiver will Perform Home Exercise Program: For increased ROM;For increased strengthening PT Goal: Perform Home Exercise Program - Progress: Goal set today Short Term Goals Time to Complete Short Term Goals: 3 weeks Short Term Goal 1: Patient will be educated on HEP Short Term Goal 2:  Patient will improve PROM to [facility] RUE to increase independence with ADL tasks Short Term Goal 3: Patient will have 3+5 strenth proximal RUE to increase ability to complete daily tasks (ie. pulling pants) Short Term Goal 4: Patient will report decreased shoulder pain to 4/10 during daily activities Short Term Goal 5: Patient wiill decrease right shoulder fascial restrictions to min-mod to increase functional reach  Long Term Goals Time to Complete Long Term Goals: 6 weeks Long Term Goal 1: Patient will return to hightest level of independence with all daily and leisure activities.  Long Term Goal 2: Patiwtn will increase AROM to [facility] in order to complete functional overhead actiivities/reaching  Long Term Goal 3: Patient will increase right shoulder strength to 4/5 in order to complete IADL activities Long Term Goal 4: Patient will decrease right shoulder fascial restrictions to trace in order to increase functional use.   Problem List Patient Active Problem List   Diagnosis Date Noted  . S/P rotator cuff repair 11/22/2012  . Insomnia 06/15/2012  . Rotator cuff tear 04/17/2012  . Hyperlipidemia LDL goal < 70 11/20/2010  . COUGH VARIANT ASTHMA 11/27/2009  . OBESITY 06/21/2009  . ESSENTIAL HYPERTENSION 03/08/2009  . FATIGUE 03/08/2009  . SHOULDER, ARTHRITIS, DEGEN./OSTEO 08/28/2007  . RUPTURE ROTATOR CUFF 08/28/2007  . LOW BACK PAIN 06/28/2007  . SCIATICA 06/28/2007  . DM (diabetes mellitus), type 2, uncontrolled 11/28/2006    End of Session Activity Tolerance: Patient tolerated treatment well General Behavior During Therapy: WFL for tasks assessed/performed OT Plan of Care OT Home Exercise Plan: Pendulums, table/towel stretches, elbow/wrist/hand AROM  Consulted and Agree with Plan of Care: Patient  GO Functional Assessment Tool Used: FOTO 35/100 Functional Limitation: Carrying, moving and handling objects Carrying, Moving and Handling Objects  Current Status [phone]): At least 60  percent but less than 80 percent impaired, limited or restricted Carrying, Moving and Handling Objects Goal Status [phone]): At least 1 percent but less than 20 percent impaired, limited or restricted  Velora Mediate, OTR/L  01/07/2013, 5:10 PM  Physician Documentation Your signature is required to indicate approval of the treatment plan as stated above.  Please sign and either send electronically or make a copy of this report for your files and return this physician signed original.  Please mark one 1.__approve of plan  2. ___approve of plan with the following conditions.   ______________________________                                                          _____________________ Physician Signature                                                                                                             Date

## 2013-01-10 ENCOUNTER — Other Ambulatory Visit: Payer: Self-pay

## 2013-01-14 ENCOUNTER — Ambulatory Visit (HOSPITAL_COMMUNITY)
Admission: RE | Admit: 2013-01-14 | Discharge: 2013-01-14 | Disposition: A | Discharge status: Home / Self Care | Payer: Medicare Other | Source: Ambulatory Visit | Attending: Family Medicine | Admitting: Family Medicine

## 2013-01-14 NOTE — Progress Notes (Signed)
Occupational Therapy Treatment Patient Details  Name: Tiffany Jacobs MRN: [id number] Date of Birth: 01-22-1946  Today's Date: 01/14/2013 Time: 4098-1191 OT Time Calculation (min): 42 min  Manual 1351-1409 (18') TherExercises 4782-9562 (24')  Visit#: 2 of 18  Re-eval: 02/05/13    Authorization:    Authorization Time Period: Before 10th visit  Authorization Visit#: 2 of 18  Subjective Symptoms/Limitations Symptoms: S:  I had surgery in September.   Limitations: PROM progressing AAROM and AROM as tolerated, follow Dr. Romeo Apple protocol  Pain Assessment Currently in Pain?: Yes Pain Score: 6  Pain Location: Shoulder Pain Orientation: Right Pain Type: Acute pain Multiple Pain Sites: No   Exercise/Treatments Supine Protraction: PROM;AAROM;10 reps External Rotation: PROM;AAROM;10 reps Internal Rotation: PROM;AAROM;10 reps Flexion: PROM;AAROM;10 reps ABduction: PROM;AAROM;10 reps Seated Elevation: AROM;10 reps Extension: AROM;10 reps Row: AROM;10 reps Standing Other Standing Exercises: body slides x 10 reps for abduction with ER Therapy Ball Flexion: 15 reps ABduction: 15 reps ROM / Strengthening / Isometric Strengthening Thumb Tacks: 1' Flexion: 3X3" External Rotation: 3X3" Internal Rotation: 3X3" ABduction: 3X3"     Manual Therapy Manual Therapy: Myofascial release Myofascial Release: MFR and manual stretching to right upper arm, scapular, trapezius region to decrease pain and fascial restrictions and increase pain free mobility.  Occupational Therapy Assessment and Plan OT Assessment and Plan Clinical Impression Statement: A:  Patient tolerated PROM/AAROM this date with complaint of increased discomfort anterior shoulder at top range.  Reports good folllow through with pendulums at home with no difficulty.  Patient tolerated ball slides this date with increased complaint of fatigue at end of treatment.  reports decreased complaint of pain to 4/10 at end of  session with decresaed complaint of tightness.  OT Plan: P: Increase HEP with table/towel slides, isometrics and encourage functional use of RUE as gross assist in daily tasks.    Goals Short Term Goals Short Term Goal 1: Patient will be educated on HEP Short Term Goal 1 Progress: Progressing toward goal Short Term Goal 2: Patient will improve PROM to [facility] RUE to increase independence with ADL tasks Short Term Goal 2 Progress: Progressing toward goal Short Term Goal 3: Patient will have 3+5 strenth proximal RUE to increase ability to complete daily tasks (ie. pulling pants) Short Term Goal 3 Progress: Progressing toward goal Short Term Goal 4: Patient will report decreased shoulder pain to 4/10 during daily activities Short Term Goal 4 Progress: Progressing toward goal Short Term Goal 5: Patient wiill decrease right shoulder fascial restrictions to min-mod to increase functional reach  Short Term Goal 5 Progress: Progressing toward goal Long Term Goals Long Term Goal 1: Patient will return to hightest level of independence with all daily and leisure activities.  Long Term Goal 1 Progress: Progressing toward goal Long Term Goal 2: Patiwtn will increase AROM to [facility] in order to complete functional overhead actiivities/reaching  Long Term Goal 2 Progress: Progressing toward goal Long Term Goal 3: Patient will increase right shoulder strength to 4/5 in order to complete IADL activities Long Term Goal 3 Progress: Progressing toward goal Long Term Goal 4: Patient will decrease right shoulder fascial restrictions to trace in order to increase functional use.  Long Term Goal 4 Progress: Progressing toward goal  Problem List Patient Active Problem List   Diagnosis Date Noted  . S/P rotator cuff repair 11/22/2012  . Insomnia 06/15/2012  . Rotator cuff tear 04/17/2012  . Hyperlipidemia LDL goal < 70 11/20/2010  . COUGH VARIANT ASTHMA 11/27/2009  . OBESITY 06/21/2009  .  ESSENTIAL HYPERTENSION  03/08/2009  . FATIGUE 03/08/2009  . SHOULDER, ARTHRITIS, DEGEN./OSTEO 08/28/2007  . RUPTURE ROTATOR CUFF 08/28/2007  . LOW BACK PAIN 06/28/2007  . SCIATICA 06/28/2007  . DM (diabetes mellitus), type 2, uncontrolled 11/28/2006    End of Session Activity Tolerance: Patient tolerated treatment well General Behavior During Therapy: [facility] for tasks assessed/performed   Velora Mediate, OTR/l  01/14/2013, 4:14 PM

## 2013-01-16 ENCOUNTER — Ambulatory Visit (HOSPITAL_COMMUNITY)
Admission: RE | Admit: 2013-01-16 | Discharge: 2013-01-16 | Disposition: A | Discharge status: Home / Self Care | Payer: Medicare Other | Source: Ambulatory Visit | Attending: Orthopedic Surgery | Admitting: Orthopedic Surgery

## 2013-01-16 NOTE — Progress Notes (Signed)
Out Occupational Therapy Treatment Patient Details  Name: Tiffany Jacobs MRN: [id number] Date of Birth: 01-14-1946  Today's Date: 01/16/2013 Time: 4098-1191 OT Time Calculation (min): 33 min Manual therapy 1315-1330 15' Therapeutic exercises 1330-1348 18'  Visit#: 3 of 18  Re-eval: 02/05/13    Authorization: [facility] Medicare   Authorization Time Period: Before 10th visit  Authorization Visit#: 3 of 18  Subjective Symptoms/Limitations Symptoms: S:  Its not hurting right now.  Limitations: PROM progressing AAROM and AROM as tolerated, follow Dr. Romeo Apple protocol  Pain Assessment Currently in Pain?: No/denies  Precautions/Restrictions    PROM progressing AAROM and AROM as tolerated, follow Dr. Romeo Apple protocol    Exercise/Treatments Supine Protraction: PROM;10 reps;AAROM;5 reps Horizontal ABduction: PROM;10 reps External Rotation: PROM;10 reps;AAROM;5 reps Internal Rotation: PROM;10 reps;AAROM;5 reps Flexion: PROM;10 reps;AAROM;5 reps ABduction: PROM;10 reps;AAROM;5 reps Therapy Ball Flexion:  (40 reps) ABduction: 25 reps ROM / Strengthening / Isometric Strengthening   Flexion: Supine;3X5" Extension: Supine;3X5" External Rotation: Supine;3X5" Internal Rotation: Supine;3X5" ABduction: Supine;3X5" ADduction: Supine;3X5"       Manual Therapy Manual Therapy: Myofascial release Myofascial Release: MFR and manual stretching to right upper arm, scapular, trapezius region to decrease pain and fascial restrictions and increase pain free mobility  Occupational Therapy Assessment and Plan OT Assessment and Plan Clinical Impression Statement: A:  Patient notes increased pain with AAROM and is motiviated to improve indepdence with AAROM.  Required moderate facilitation from OTR/L  OT Plan: P:  Increase independence with dowel rod exercises.   Goals Short Term Goals Short Term Goal 1: Patient will be educated on HEP Short Term Goal 2: Patient will improve PROM to [facility]  RUE to increase independence with ADL tasks Short Term Goal 3: Patient will have 3+5 strenth proximal RUE to increase ability to complete daily tasks (ie. pulling pants) Short Term Goal 4: Patient will report decreased shoulder pain to 4/10 during daily activities Short Term Goal 5: Patient wiill decrease right shoulder fascial restrictions to min-mod to increase functional reach  Long Term Goals Long Term Goal 1: Patient will return to hightest level of independence with all daily and leisure activities.  Long Term Goal 2: Patiwtn will increase AROM to [facility] in order to complete functional overhead actiivities/reaching  Long Term Goal 3: Patient will increase right shoulder strength to 4/5 in order to complete IADL activities Long Term Goal 4: Patient will decrease right shoulder fascial restrictions to trace in order to increase functional use.   Problem List Patient Active Problem List   Diagnosis Date Noted  . S/P rotator cuff repair 11/22/2012  . Insomnia 06/15/2012  . Rotator cuff tear 04/17/2012  . Hyperlipidemia LDL goal < 70 11/20/2010  . COUGH VARIANT ASTHMA 11/27/2009  . OBESITY 06/21/2009  . ESSENTIAL HYPERTENSION 03/08/2009  . FATIGUE 03/08/2009  . SHOULDER, ARTHRITIS, DEGEN./OSTEO 08/28/2007  . RUPTURE ROTATOR CUFF 08/28/2007  . LOW BACK PAIN 06/28/2007  . SCIATICA 06/28/2007  . DM (diabetes mellitus), type 2, uncontrolled 11/28/2006    End of Session Activity Tolerance: Patient tolerated treatment well General Behavior During Therapy: [facility] for tasks assessed/performed  GO    Shirlean Mylar, OTR/L  01/16/2013, 2:22 PM

## 2013-01-18 ENCOUNTER — Ambulatory Visit (INDEPENDENT_AMBULATORY_CARE_PROVIDER_SITE_OTHER): Payer: Medicare Other | Admitting: Family Medicine

## 2013-01-18 ENCOUNTER — Ambulatory Visit (HOSPITAL_COMMUNITY)
Admission: RE | Admit: 2013-01-18 | Discharge: 2013-01-18 | Disposition: A | Discharge status: Home / Self Care | Payer: Medicare Other | Source: Ambulatory Visit | Attending: Family Medicine | Admitting: Family Medicine

## 2013-01-18 ENCOUNTER — Encounter: Payer: Self-pay | Admitting: Family Medicine

## 2013-01-18 VITALS — BP 122/86 | HR 100 | Resp 18 | Ht 62.5 in | Wt 221.0 lb

## 2013-01-18 DIAGNOSIS — G4700 Insomnia, unspecified: Secondary | ICD-10-CM

## 2013-01-18 DIAGNOSIS — R5381 Other malaise: Secondary | ICD-10-CM

## 2013-01-18 DIAGNOSIS — E1165 Type 2 diabetes mellitus with hyperglycemia: Secondary | ICD-10-CM

## 2013-01-18 DIAGNOSIS — J45991 Cough variant asthma: Secondary | ICD-10-CM

## 2013-01-18 DIAGNOSIS — Z13820 Encounter for screening for osteoporosis: Secondary | ICD-10-CM

## 2013-01-18 DIAGNOSIS — M75120 Complete rotator cuff tear or rupture of unspecified shoulder, not specified as traumatic: Secondary | ICD-10-CM

## 2013-01-18 DIAGNOSIS — E785 Hyperlipidemia, unspecified: Secondary | ICD-10-CM

## 2013-01-18 DIAGNOSIS — E669 Obesity, unspecified: Secondary | ICD-10-CM

## 2013-01-18 DIAGNOSIS — M75121 Complete rotator cuff tear or rupture of right shoulder, not specified as traumatic: Secondary | ICD-10-CM

## 2013-01-18 DIAGNOSIS — I10 Essential (primary) hypertension: Secondary | ICD-10-CM

## 2013-01-18 DIAGNOSIS — Z9119 Patient's noncompliance with other medical treatment and regimen: Secondary | ICD-10-CM

## 2013-01-18 DIAGNOSIS — Z139 Encounter for screening, unspecified: Secondary | ICD-10-CM

## 2013-01-18 MED ORDER — CALCIUM CARBONATE-VITAMIN D 500-200 MG-UNIT PO TABS: 1.0000 | ORAL_TABLET | Freq: Two times a day (BID) | ORAL | Status: DC

## 2013-01-18 NOTE — Progress Notes (Signed)
Occupational Therapy Treatment Patient Details  Name: Tiffany Jacobs MRN: [id number] Date of Birth: [birth year]  Today's Date: 01/18/2013 Time: 8469-6295 OT Time Calculation (min): 42 min Manual therapy 2841-3244 13' 1121-1150 29' therapetuic exercises  Visit#: 4 of 18  Re-eval: 02/05/13    Authorization: [facility] Medicare   Authorization Time Period: Before 10th visit  Authorization Visit#: 4 of 10  Subjective Symptoms/Limitations Symptoms: S:  I want to do all I can.  I want my arm to get better.  Limitations: PROM progressing AAROM and AROM as tolerated, follow Dr. Romeo Apple protocol  Pain Assessment Currently in Pain?: Yes Pain Score: 4  Pain Location: Shoulder Pain Orientation: Right Pain Type: Acute pain  Precautions/Restrictions    PROM progressing AAROM and AROM as tolerated, follow Dr. Romeo Apple protocol    Exercise/Treatments Supine Protraction: PROM;10 reps;AAROM;5 reps Horizontal ABduction: PROM;10 reps External Rotation: PROM;AAROM;10 reps Internal Rotation: PROM;AAROM;10 reps Flexion: PROM;AAROM;10 reps ABduction: PROM;AAROM;10 reps Other Supine Exercises: bridges 20 times Seated Elevation: AROM;15 reps Extension: AROM;15 reps Row: AROM;15 reps Other Seated Exercises: elbow flexion, extension, supination, pronation, wrist flexion and extension 10 times  Therapy Ball Flexion: 25 reps ABduction: 25 reps ROM / Strengthening / Isometric Strengthening   Flexion: Supine;3X5" Extension: Supine;3X5" External Rotation: Supine;3X5" Internal Rotation: Supine;3X5" ABduction: Supine;3X5" ADduction: Supine;3X5"    Manual Therapy Manual Therapy: Myofascial release Myofascial Release: MFR and manual stretching to right upper arm, scapular, trapezius region to decrease pain and fascial restrictions and increase pain free mobility  Occupational Therapy Assessment and Plan OT Assessment and Plan Clinical Impression Statement: A:  Patient able to complete 10  repetitions of AAROM for all ranges except protraction, which continues to be most difficult for patient.  OT Plan: P:   Complete dowel rod in all supine ranges without assistance. Attempt thumbtacks and prot/ret//elev/dep.   Goals Short Term Goals Short Term Goal 1: Patient will be educated on HEP Short Term Goal 2: Patient will improve PROM to [facility] RUE to increase independence with ADL tasks Short Term Goal 3: Patient will have 3+5 strenth proximal RUE to increase ability to complete daily tasks (ie. pulling pants) Short Term Goal 4: Patient will report decreased shoulder pain to 4/10 during daily activities Short Term Goal 5: Patient wiill decrease right shoulder fascial restrictions to min-mod to increase functional reach  Long Term Goals Long Term Goal 1: Patient will return to hightest level of independence with all daily and leisure activities.  Long Term Goal 2: Patiwtn will increase AROM to [facility] in order to complete functional overhead actiivities/reaching  Long Term Goal 3: Patient will increase right shoulder strength to 4/5 in order to complete IADL activities Long Term Goal 4: Patient will decrease right shoulder fascial restrictions to trace in order to increase functional use.   Problem List Patient Active Problem List   Diagnosis Date Noted  . S/P rotator cuff repair 11/22/2012  . Insomnia 06/15/2012  . Rotator cuff tear 04/17/2012  . Hyperlipidemia LDL goal < 70 11/20/2010  . COUGH VARIANT ASTHMA 11/27/2009  . OBESITY 06/21/2009  . ESSENTIAL HYPERTENSION 03/08/2009  . FATIGUE 03/08/2009  . SHOULDER, ARTHRITIS, DEGEN./OSTEO 08/28/2007  . RUPTURE ROTATOR CUFF 08/28/2007  . LOW BACK PAIN 06/28/2007  . SCIATICA 06/28/2007  . DM (diabetes mellitus), type 2, uncontrolled 11/28/2006    End of Session Activity Tolerance: Patient tolerated treatment well General Behavior During Therapy: [facility] for tasks assessed/performed  GO    Shirlean Mylar, OTR/L  01/18/2013,  11:49 AM

## 2013-01-18 NOTE — Patient Instructions (Addendum)
F/u in 4 month, call if you need me before   Blood pressure is great.  Blood sugar has increased and a lot of this is because lack of control over your eating , as you say, you are very stressed and worried about different situations in your life, and espescialy at those times your eating becomes even more uncontrolled  I highly recommend both therapy and medication management to help with this, call if you need referral to a therapist please  Please commit to walking for 30 minutes every day, also to changing your eating habits, so that you eat the same amount regularly  Fasting lipid, cmp and eGFr , HBA1C, TSH and vit D in 4 month,before visit.  Please start calcium with D daily for bones  You are referred for a bone density test, pls get appt at checkout

## 2013-01-18 NOTE — Progress Notes (Signed)
  Subjective:    Patient ID: Tiffany Jacobs, female    DOB: 06/22/1945, 67 y.o.   MRN: [id number]  HPI The PT is here for follow up and re-evaluation of chronic medical conditions, medication management and review of any available recent lab and radiology data.  Preventive health is updated, specifically  Cancer screening and Immunization.  Refuses flu vaccine, got sick in the past Had successful right shoulder surgery in September , from which she is recovering well The PT denies any adverse reactions to current medications since the last visit. She had tried to increase to farxiga 10mg  dose , but was unable to tolerate this. C/o being stressed and overwhelmed, espescialy when she has to babysit her grandchildren. Has seen a therapist in the past and is willing to try to find her once more, no interest in an alternative therapist now. Her eating is not controlled as her stress gets out of whack , depression screen is a 10, however, though I encourage her to use an SSRI to help with this , she is not interested. She is not suicidal or homicidal      Review of Systems See HPI Denies recent fever or chills. Denies sinus pressure, nasal congestion, ear pain or sore throat. Denies chest congestion, productive cough or wheezing. Denies chest pains, palpitations and leg swelling Denies abdominal pain, nausea, vomiting,diarrhea or constipation.   Denies dysuria, frequency, hesitancy or incontinence. Denies headaches, seizures, numbness, or tingling.  Denies skin break down or rash.        Objective:   Physical Exam  Patient alert and oriented and in no cardiopulmonary distress.  HEENT: No facial asymmetry, EOMI, no sinus tenderness,  oropharynx pink and moist.  Neck supple no adenopathy.  Chest: Clear to auscultation bilaterally.  CVS: S1, S2 no murmurs, no S3.  ABD: Soft non tender. Bowel sounds normal.  Ext: No edema  MS: Adequate ROM spine, decreased ROM right   Shoulder,adequate in  hips and knees.  Skin: Intact, no ulcerations or rash noted.Scar well healed from recent surgery  Psych: Good eye contact, normal affect. Memory intact not anxious or depressed appearing.  CNS: CN 2-12 intact, power, tone and sensation normal throughout.       Assessment & Plan:

## 2013-01-19 ENCOUNTER — Telehealth: Payer: Self-pay | Admitting: Family Medicine

## 2013-01-19 DIAGNOSIS — Z9119 Patient's noncompliance with other medical treatment and regimen: Secondary | ICD-10-CM | POA: Insufficient documentation

## 2013-01-19 DIAGNOSIS — Z91199 Patient's noncompliance with other medical treatment and regimen due to unspecified reason: Secondary | ICD-10-CM | POA: Insufficient documentation

## 2013-01-19 NOTE — Assessment & Plan Note (Signed)
Elevated TG Hyperlipidemia:Low fat diet discussed and encouraged.  Uncontrolled. Rept lab next visit

## 2013-01-19 NOTE — Telephone Encounter (Signed)
Please contact pt. Pls ex[plain that on review, I feel she needs an additional glipizide 2.5mg  tablet once daily at breakfast, already taking 10mg  daily, and to continue her metformin and farxiga as before, since her blood sugar increased from a 7.0 to 7.6 I entered the new med , and this will need to be sent after speaking with her. Ensure she understands not to stop or change the way she is taking her other diabetic medication. I tried calling her but phone rand busy. Please send her a note to contact the office if unable to get her , thanks

## 2013-01-19 NOTE — Assessment & Plan Note (Signed)
Deteriorated. Patient re-educated about  the importance of commitment to a  minimum of 150 minutes of exercise per week. The importance of healthy food choices with portion control discussed. Encouraged to start a food diary, count calories and to consider  joining a support group. Sample diet sheets offered. Goals set by the patient for the next several months.    

## 2013-01-19 NOTE — Assessment & Plan Note (Signed)
Stable and controlled

## 2013-01-19 NOTE — Assessment & Plan Note (Addendum)
Deteriorated Patient educated about the importance of limiting  Carbohydrate intake , the need to commit to daily physical activity for a minimum of 30 minutes , and to commit weight loss. The fact that changes in all these areas will reduce or eliminate all together the development of diabetes is stressed.   No med change at this visit, may need to add additional glipizide 5mg  , would start with 2,5 mg additionally, pt to be contacted about this

## 2013-01-19 NOTE — Assessment & Plan Note (Signed)
Controlled, no change in medication DASH diet and commitment to daily physical activity for a minimum of 30 minutes discussed and encouraged, as a part of hypertension management. The importance of attaining a healthy weight is also discussed.  

## 2013-01-19 NOTE — Assessment & Plan Note (Signed)
Continues to refuse immunizations recommended, has only had tdAP

## 2013-01-19 NOTE — Assessment & Plan Note (Signed)
Unchanged, sleep hygiene reviewed. Judicious use of medication as before

## 2013-01-21 ENCOUNTER — Ambulatory Visit (HOSPITAL_COMMUNITY)
Admission: RE | Admit: 2013-01-21 | Discharge: 2013-01-21 | Disposition: A | Discharge status: Home / Self Care | Payer: Medicare Other | Source: Ambulatory Visit | Attending: Orthopedic Surgery | Admitting: Orthopedic Surgery

## 2013-01-21 NOTE — Progress Notes (Signed)
Occupational Therapy Treatment Patient Details  Name: Tiffany Jacobs MRN: [id number] Date of Birth: 1945-09-04  Today's Date: 01/21/2013 Time: 4098-1191 OT Time Calculation (min): 45 min Manual 1350-1405 (15') TherExercises 1405-1435 (30')  Visit#: 5 of 18  Re-eval: 02/05/13    Authorization: [employer]   Authorization Time Period: Before 10th visit  Authorization Visit#: 4 of 10  Subjective Symptoms/Limitations Symptoms: S: I have been trying to use this arm more at home and sometimes I think I do too much cause then I pay for it at night . Pain Assessment Currently in Pain?: Yes Pain Score: 5  Pain Location: Shoulder Pain Orientation: Right Pain Type: Acute pain Multiple Pain Sites: No   Exercise/Treatments Supine Protraction: PROM;AAROM;10 reps Horizontal ABduction: PROM;10 reps External Rotation: PROM;AAROM;10 reps Internal Rotation: PROM;AAROM;10 reps Flexion: PROM;AAROM;10 reps ABduction: PROM;AAROM;10 reps Other Supine Exercises: bridges 20 times Seated Elevation: AROM;15 reps Extension: AROM;15 reps Row: AROM;15 reps Other Seated Exercises: elbow flexion, extension, supination, pronation, wrist flexion and extension 10 times    Therapy Ball Flexion: 25 reps ABduction: 25 reps ROM / Strengthening / Isometric Strengthening Thumb Tacks: 1' (elbows flexed at 90 for thumbs on wall ) Flexion: Supine;3X5" Extension: Supine;3X5" External Rotation: Supine;3X5" Internal Rotation: Supine;3X5" ABduction: Supine;3X5" ADduction: Supine;3X5"       Manual Therapy Manual Therapy: Myofascial release Myofascial Release: MFR and manual stretching to right upper arm, scapular, trapezius region to decrease pain and fascial restrictions and increase pain free mobility  Occupational Therapy Assessment and Plan OT Assessment and Plan Clinical Impression Statement: A: Patient completed AAROM protraction this date with slight increased complaint of discomfort and  continues to report most difficult.  Patient reports improved functional use at home with RUE however states she feels as though she may be over using as she has increased pain at bedtime.  Discussed and worked on positionin with patient with improved compaint of stress on shoulder.  patient to attempt at home next few  nights  OT Plan: P:   Complete dowel rod in all supine ranges without assistance. Attempt thumbtacks and prot/ret//elev/dep.   Goals Short Term Goals Short Term Goal 1: Patient will be educated on HEP Short Term Goal 1 Progress: Progressing toward goal Short Term Goal 2: Patient will improve PROM to [facility] RUE to increase independence with ADL tasks Short Term Goal 2 Progress: Progressing toward goal Short Term Goal 3: Patient will have 3+5 strenth proximal RUE to increase ability to complete daily tasks (ie. pulling pants) Short Term Goal 3 Progress: Progressing toward goal Short Term Goal 4: Patient will report decreased shoulder pain to 4/10 during daily activities Short Term Goal 4 Progress: Progressing toward goal Short Term Goal 5: Patient wiill decrease right shoulder fascial restrictions to min-mod to increase functional reach  Short Term Goal 5 Progress: Progressing toward goal Long Term Goals Long Term Goal 1: Patient will return to hightest level of independence with all daily and leisure activities.  Long Term Goal 1 Progress: Progressing toward goal Long Term Goal 3: Patient will increase right shoulder strength to 4/5 in order to complete IADL activities Long Term Goal 3 Progress: Progressing toward goal Long Term Goal 4: Patient will decrease right shoulder fascial restrictions to trace in order to increase functional use.  Long Term Goal 4 Progress: Progressing toward goal  Problem List Patient Active Problem List   Diagnosis Date Noted  . Personal history of noncompliance with medical treatment, presenting hazards to health 01/19/2013  . S/P rotator cuff  repair 11/22/2012  . Insomnia 06/15/2012  . Rotator cuff tear 04/17/2012  . Hyperlipidemia LDL goal < 70 11/20/2010  . COUGH VARIANT ASTHMA 11/27/2009  . OBESITY 06/21/2009  . ESSENTIAL HYPERTENSION 03/08/2009  . FATIGUE 03/08/2009  . SHOULDER, ARTHRITIS, DEGEN./OSTEO 08/28/2007  . RUPTURE ROTATOR CUFF 08/28/2007  . LOW BACK PAIN 06/28/2007  . SCIATICA 06/28/2007  . DM (diabetes mellitus), type 2, uncontrolled 11/28/2006    End of Session Activity Tolerance: Patient tolerated treatment well General Behavior During Therapy: [facility] for tasks assessed/performed  GO    Velora Mediate, OTR/l  01/21/2013, 4:03 PM

## 2013-01-23 ENCOUNTER — Ambulatory Visit (HOSPITAL_COMMUNITY): Payer: Medicare Other | Admitting: Specialist

## 2013-01-24 ENCOUNTER — Ambulatory Visit (HOSPITAL_COMMUNITY)
Admission: RE | Admit: 2013-01-24 | Discharge: 2013-01-24 | Disposition: A | Discharge status: Home / Self Care | Payer: Medicare Other | Source: Ambulatory Visit | Attending: Family Medicine | Admitting: Family Medicine

## 2013-01-24 DIAGNOSIS — Z13820 Encounter for screening for osteoporosis: Secondary | ICD-10-CM | POA: Insufficient documentation

## 2013-01-24 DIAGNOSIS — M069 Rheumatoid arthritis, unspecified: Secondary | ICD-10-CM | POA: Insufficient documentation

## 2013-01-24 DIAGNOSIS — Z780 Asymptomatic menopausal state: Secondary | ICD-10-CM | POA: Insufficient documentation

## 2013-01-25 ENCOUNTER — Ambulatory Visit (HOSPITAL_COMMUNITY)
Admission: RE | Admit: 2013-01-25 | Discharge: 2013-01-25 | Disposition: A | Discharge status: Home / Self Care | Payer: Medicare Other | Source: Ambulatory Visit | Attending: Orthopedic Surgery | Admitting: Orthopedic Surgery

## 2013-01-25 NOTE — Telephone Encounter (Signed)
Called patient.  No ability to leave message.   Sent letter for patient to call or stop by office to receive change in medication.

## 2013-01-28 ENCOUNTER — Ambulatory Visit (HOSPITAL_COMMUNITY)
Admission: RE | Admit: 2013-01-28 | Discharge: 2013-01-28 | Disposition: A | Discharge status: Home / Self Care | Payer: Medicare Other | Source: Ambulatory Visit | Attending: Family Medicine | Admitting: Family Medicine

## 2013-01-28 NOTE — Progress Notes (Signed)
Occupational Therapy Treatment Patient Details  Name: Tiffany Jacobs MRN: [id number] Date of Birth: Jan 21, 1946  Today's Date: 01/25/2013 Time: 4098-1191 OT Time Calculation (min): 43 min Manual 1352-1407 (15') TherExercises 4782-9562 (28')  Visit#: 6 of 18  Re-eval: 02/05/13    Authorization: [employer]   Authorization Time Period: Before 10th visit  Authorization Visit#: 6 of 10  Subjective Symptoms/Limitations Symptoms: S: I have been doing those towel exercises maybe a little too  much  Pain Assessment Currently in Pain?: No/denies  Exercise/Treatments Supine Protraction: PROM;AAROM;10 reps Horizontal ABduction: PROM;10 reps External Rotation: PROM;AAROM;10 reps Internal Rotation: PROM;AAROM;10 reps Flexion: PROM;AAROM;10 reps ABduction: PROM;AAROM;10 reps Other Supine Exercises: bridges 20 times Seated Elevation: AROM;15 reps Extension: AROM;15 reps Row: AROM;15 reps Other Seated Exercises: elbow flexion, extension, supination, pronation, wrist flexion and extension 10 times  Therapy Ball Flexion: 25 reps ABduction: 25 reps ROM / Strengthening / Isometric Strengthening Wall Wash: 30" low range  Thumb Tacks: 1' (elbows flexed at 90 for thumb on wall ) Flexion: Supine;3X5" Extension: Supine;3X5" External Rotation: Supine;3X5" Internal Rotation: Supine;3X5" ABduction: Supine;3X5" ADduction: Supine;3X5"          Manual Therapy Manual Therapy: Myofascial release Myofascial Release: MFR and manual stretching to right upper arm, scapular, trapezius region to decrease pain and fascial restrictions and increase pain free mobility  Occupational Therapy Assessment and Plan OT Assessment and Plan Clinical Impression Statement: A: Patient arrives this date with her cousin to help her remember her exercises for home as she states she is having trouble remembering.  Improved tolerance with thumbtacks this date.  initiated low range wall slides this date with  fair tolerance.  OT Plan: P:   Complete dowel rod in all supine ranges without assistance. Attempt prot/ret//elev/dep and sidelying ROM.   Goals Short Term Goals Short Term Goal 1: Patient will be educated on HEP Short Term Goal 1 Progress: Progressing toward goal Short Term Goal 2: Patient will improve PROM to [facility] RUE to increase independence with ADL tasks Short Term Goal 2 Progress: Progressing toward goal Short Term Goal 3: Patient will have 3+5 strenth proximal RUE to increase ability to complete daily tasks (ie. pulling pants) Short Term Goal 3 Progress: Progressing toward goal Short Term Goal 4: Patient will report decreased shoulder pain to 4/10 during daily activities Short Term Goal 4 Progress: Progressing toward goal Short Term Goal 5: Patient wiill decrease right shoulder fascial restrictions to min-mod to increase functional reach  Short Term Goal 5 Progress: Progressing toward goal Long Term Goals Long Term Goal 1: Patient will return to hightest level of independence with all daily and leisure activities.  Long Term Goal 1 Progress: Progressing toward goal Long Term Goal 2: Patiwtn will increase AROM to [facility] in order to complete functional overhead actiivities/reaching  Long Term Goal 2 Progress: Progressing toward goal Long Term Goal 3: Patient will increase right shoulder strength to 4/5 in order to complete IADL activities Long Term Goal 3 Progress: Progressing toward goal Long Term Goal 4: Patient will decrease right shoulder fascial restrictions to trace in order to increase functional use.  Long Term Goal 4 Progress: Progressing toward goal  Problem List Patient Active Problem List   Diagnosis Date Noted  . Personal history of noncompliance with medical treatment, presenting hazards to health 01/19/2013  . S/P rotator cuff repair 11/22/2012  . Insomnia 06/15/2012  . Rotator cuff tear 04/17/2012  . Hyperlipidemia LDL goal < 70 11/20/2010  . COUGH VARIANT ASTHMA  11/27/2009  .  OBESITY 06/21/2009  . ESSENTIAL HYPERTENSION 03/08/2009  . FATIGUE 03/08/2009  . SHOULDER, ARTHRITIS, DEGEN./OSTEO 08/28/2007  . RUPTURE ROTATOR CUFF 08/28/2007  . LOW BACK PAIN 06/28/2007  . SCIATICA 06/28/2007  . DM (diabetes mellitus), type 2, uncontrolled 11/28/2006    End of Session Activity Tolerance: Patient tolerated treatment well General Behavior During Therapy: [facility] for tasks assessed/performed  GO    Velora Mediate, OTR/L  01/25/2013, 4:15 PM

## 2013-01-28 NOTE — Evaluation (Signed)
Occupational Therapy Re-Evaluation  Patient Details  Name: Tiffany Jacobs MRN: [id number] Date of Birth: 1945/06/01  Today's Date: 01/28/2013 Time: 7829-5621 OT Time Calculation (min): 46 min Manual [id number] (15') Reassessment 1319-1330 (11') TherExercise 1330-1350 (20')  Visit#: 7 of 18  Re-eval: 02/25/13  Assessment Diagnosis: Right Rotator Cuff Repair Surgical Date: 11/09/12  Authorization: [facility] Medicare   Authorization Time Period: Before 17th visit  Authorization Visit#: 7 of 17   Past Medical History:  Past Medical History  Diagnosis Date  . Hypertension   . Diabetes mellitus, type 2   . Sciatica   . Low back pain   . Obesity   . Seasonal allergies   . Asthma   . RAD (reactive airway disease)   . GERD (gastroesophageal reflux disease)   . Arthritis    Past Surgical History:  Past Surgical History  Procedure Laterality Date  . Total knee arthroplasty left  02-01-05    Dr. Romeo Apple  . Tubal ligation  1970  . Left breast biopsy for benign disease    . Shoulder open rotator cuff repair Right 11/09/2012    Procedure: ROTATOR CUFF REPAIR Right SHOULDER OPEN;  Surgeon: Vickki Hearing, MD;  Location: [facility];  Service: Orthopedics;  Laterality: Right;  . Shoulder acromioplasty Right 11/09/2012    Procedure: RIGHT SHOULDER ACROMIOPLASTY;  Surgeon: Vickki Hearing, MD;  Location: [facility];  Service: Orthopedics;  Laterality: Right;  . Acromio-clavicular joint repair Right 11/09/2012    Procedure: ACROMIO-CLAVICULAR JOINT REPAIR;  Surgeon: Vickki Hearing, MD;  Location: [facility];  Service: Orthopedics;  Laterality: Right;    Subjective Symptoms/Limitations Symptoms: S:  I am doing my exercises... they are doing good.   Pain Assessment Currently in Pain?: Yes Pain Score: 4  Pain Location: Shoulder Pain Orientation: Right Pain Type: Acute pain Multiple Pain Sites: No  Precautions/Restrictions  Precautions Precautions:  Shoulder   Assessment ADL/Vision/Perception ADL ADL Comments: Reports improvement in donning shirts, tying shoes, pulling pants.  continues with difficulty with turning her bra.      Additional Assessments RUE AROM (degrees) Overall AROM Right Upper Extremity: Unable to assess (on initial eval ) RUE Overall AROM Comments: measured in supine ER/IR in adduction  Right Shoulder Flexion: 82 Degrees Right Shoulder ABduction: 78 Degrees Right Shoulder Internal Rotation: 72 Degrees Right Shoulder External Rotation: 65 Degrees RUE Strength Right Shoulder Flexion: 2/5 Right Shoulder ABduction: 2/5 Right Shoulder Internal Rotation: 3/5 Right Shoulder External Rotation: 3-/5 Palpation Palpation: mod fascial restrictions throughout right upper arm, trapezious and scapularis     Exercise/Treatments Supine Protraction: PROM;AAROM;10 reps Horizontal ABduction: PROM;10 reps External Rotation: PROM;AAROM;10 reps Internal Rotation: PROM;AAROM;10 reps Flexion: PROM;AAROM;10 reps ABduction: PROM;AAROM;10 reps Seated Elevation: AROM;15 reps Extension: AROM;15 reps Row: AROM;15 reps Therapy Ball Flexion: 20 reps ABduction: 20 reps ROM / Strengthening / Isometric Strengthening Wall Wash: 1' Flexion: 3X3";Supine Extension: 3X3";Supine External Rotation: 3X3";Supine Internal Rotation: 3X3";Supine ABduction: 3X3";Supine ADduction: 3X3";Supine     Manual Therapy Manual Therapy: Myofascial release Myofascial Release: MFR and manual stretching to right upper arm, scapular, trapezius region to decrease pain and fascial restrictions and increase pain free mobility  Occupational Therapy Assessment and Plan OT Assessment and Plan Clinical Impression Statement: A:  Patient presents with increased complaint of pain in her Right shoulder this date. She reports working multiple times a day on towel slides and pendulums.  Encouraged patient to do exercises 3-4 times a day and no more secondary to  increased pain.  Patient reports improvement in  ADL tasks (pulling pants, tiying shoes, donning shirt. Patient reports complaint of pain 2/10 following treatment.  Patient demos improved ROM in supine and good follow through with HEPs.  She has only met 1/5 STGs and is progressing towards remaining.   OT Plan: P:   Recommend continue skilled OT services to maximize functional potential/independence.  Complete dowel rod in all supine ranges without assistance. Attempt prot/ret//elev/dep.   Goals Short Term Goals Short Term Goal 1: Patient will be educated on HEP Short Term Goal 1 Progress: Met Short Term Goal 2: Patient will improve PROM to [facility] RUE to increase independence with ADL tasks Short Term Goal 2 Progress: Partly met Short Term Goal 3: Patient will have 3+5 strenth proximal RUE to increase ability to complete daily tasks (ie. pulling pants) Short Term Goal 3 Progress: Partly met Short Term Goal 4: Patient will report decreased shoulder pain to 4/10 during daily activities Short Term Goal 4 Progress: Partly met Short Term Goal 5: Patient wiill decrease right shoulder fascial restrictions to min-mod to increase functional reach  Short Term Goal 5 Progress: Progressing toward goal Long Term Goals Long Term Goal 1: Patient will return to hightest level of independence with all daily and leisure activities.  Long Term Goal 1 Progress: Progressing toward goal Long Term Goal 2: Patiwtn will increase AROM to [facility] in order to complete functional overhead actiivities/reaching  Long Term Goal 2 Progress: Progressing toward goal Long Term Goal 3: Patient will increase right shoulder strength to 4/5 in order to complete IADL activities Long Term Goal 3 Progress: Progressing toward goal Long Term Goal 4: Patient will decrease right shoulder fascial restrictions to trace in order to increase functional use.  Long Term Goal 4 Progress: Progressing toward goal  Problem List Patient Active Problem  List   Diagnosis Date Noted  . Personal history of noncompliance with medical treatment, presenting hazards to health 01/19/2013  . S/P rotator cuff repair 11/22/2012  . Insomnia 06/15/2012  . Rotator cuff tear 04/17/2012  . Hyperlipidemia LDL goal < 70 11/20/2010  . COUGH VARIANT ASTHMA 11/27/2009  . OBESITY 06/21/2009  . ESSENTIAL HYPERTENSION 03/08/2009  . FATIGUE 03/08/2009  . SHOULDER, ARTHRITIS, DEGEN./OSTEO 08/28/2007  . RUPTURE ROTATOR CUFF 08/28/2007  . LOW BACK PAIN 06/28/2007  . SCIATICA 06/28/2007  . DM (diabetes mellitus), type 2, uncontrolled 11/28/2006    End of Session Activity Tolerance: Patient tolerated treatment well General Behavior During Therapy: [facility] for tasks assessed/performed  GO Functional Assessment Tool Used: Clinical Observation  Functional Limitation: Carrying, moving and handling objects Carrying, Moving and Handling Objects Current Status (G9562): At least 60 percent but less than 80 percent impaired, limited or restricted Carrying, Moving and Handling Objects Goal Status [phone]): At least 1 percent but less than 20 percent impaired, limited or restricted  Velora Mediate, OTR/L 01/28/2013, 2:27 PM  Physician Documentation Your signature is required to indicate approval of the treatment plan as stated above.  Please sign and either send electronically or make a copy of this report for your files and return this physician signed original.  Please mark one 1.__approve of plan  2. ___approve of plan with the following conditions.   ______________________________  _____________________ Physician Signature                                                                                                             Date

## 2013-01-29 ENCOUNTER — Encounter: Payer: Self-pay | Admitting: Family Medicine

## 2013-01-29 ENCOUNTER — Ambulatory Visit (INDEPENDENT_AMBULATORY_CARE_PROVIDER_SITE_OTHER): Payer: Medicare Other | Admitting: Family Medicine

## 2013-01-29 ENCOUNTER — Other Ambulatory Visit: Payer: Self-pay

## 2013-01-29 ENCOUNTER — Ambulatory Visit (INDEPENDENT_AMBULATORY_CARE_PROVIDER_SITE_OTHER): Payer: Medicare Other | Admitting: Orthopedic Surgery

## 2013-01-29 VITALS — BP 128/74 | HR 88 | Resp 18 | Ht 62.5 in | Wt 219.0 lb

## 2013-01-29 VITALS — BP 148/98 | Ht 62.5 in | Wt 225.0 lb

## 2013-01-29 DIAGNOSIS — Z9889 Other specified postprocedural states: Secondary | ICD-10-CM

## 2013-01-29 DIAGNOSIS — L02212 Cutaneous abscess of back [any part, except buttock]: Secondary | ICD-10-CM | POA: Insufficient documentation

## 2013-01-29 DIAGNOSIS — I10 Essential (primary) hypertension: Secondary | ICD-10-CM

## 2013-01-29 DIAGNOSIS — L02219 Cutaneous abscess of trunk, unspecified: Secondary | ICD-10-CM

## 2013-01-29 DIAGNOSIS — E1165 Type 2 diabetes mellitus with hyperglycemia: Secondary | ICD-10-CM

## 2013-01-29 MED ORDER — CEFTRIAXONE SODIUM 1 G IJ SOLR
500.0000 mg | Freq: Once | INTRAMUSCULAR | Status: AC
  Administered 2013-01-29: 500 mg via INTRAMUSCULAR

## 2013-01-29 MED ORDER — HYDROCODONE-ACETAMINOPHEN 7.5-325 MG PO TABS: 1.0000 | ORAL_TABLET | ORAL | Status: DC | PRN

## 2013-01-29 MED ORDER — DOXYCYCLINE HYCLATE 100 MG PO CAPS: 100.0000 mg | ORAL_CAPSULE | Freq: Two times a day (BID) | ORAL | Status: AC

## 2013-01-29 MED ORDER — GLIPIZIDE ER 2.5 MG PO TB24: 2.5000 mg | ORAL_TABLET | Freq: Every day | ORAL | Status: DC

## 2013-01-29 NOTE — Telephone Encounter (Signed)
Patient aware and med sent  

## 2013-01-29 NOTE — Patient Instructions (Signed)
F/u as before, call if you need me befreo  You have a bad infection with a boil on your upper back .Marland Kitchen We have made arrangements for you to be seen by Dr Malvin Johns (next building) as soon as you leave here to have this attended to.I expect that he will follow the boil until completely healed    You will receive Rocephin 500mg  IM in the office today before you leave, and a 10 day course of antibiotics , doxycycline is sent to your pharmacy , please fill and take all as directed.

## 2013-01-29 NOTE — Progress Notes (Signed)
  Subjective:    Patient ID: Tiffany Jacobs, female    DOB: 10/03/1945, 67 y.o.   MRN: [id number]  HPI 1 week h/o boil on back increasing in size and very painful, has had intermittent chills, no documented fevr, pain is so severe she is unable to lie on her back, no drainage from the area. Blood sugar fluctuating and more uncontrolled since boils has developed   Review of Systems See HPI Denies recent fever or chills. Denies sinus pressure, nasal congestion, ear pain or sore throat. Denies chest congestion, productive cough or wheezing. Denies chest pains, palpitations and leg swelling Denies abdominal pain, nausea, vomiting,diarrhea or constipation.           Objective:   Physical Exam Patient alert and oriented and in no cardiopulmonary distress.Pt in pain  HEENT: No facial asymmetry, EOMI, no sinus tenderness,  oropharynx pink and moist.  Neck supple no adenopathy.  Chest: Clear to auscultation bilaterally.  CVS: S1, S2 no murmurs, no S3.  ABD: Soft non tender. Bowel sounds normal.  Ext: No edema  Skin: absess on upper back diameter approx 5 inches, red and warm, no drainage, area also very deep, at least 2.5 inches   Psych: Good eye contact, normal affect. Memory intact not anxious or depressed appearing.  CNS: CN 2-12 intact, power, tone and sensation normal throughout.        Assessment & Plan:

## 2013-01-30 ENCOUNTER — Telehealth: Payer: Self-pay

## 2013-01-30 ENCOUNTER — Encounter: Payer: Self-pay | Admitting: Orthopedic Surgery

## 2013-01-30 ENCOUNTER — Ambulatory Visit (HOSPITAL_COMMUNITY)
Admission: RE | Admit: 2013-01-30 | Discharge: 2013-01-30 | Disposition: A | Discharge status: Home / Self Care | Payer: Medicare Other | Source: Ambulatory Visit | Attending: Orthopedic Surgery | Admitting: Orthopedic Surgery

## 2013-01-30 NOTE — Progress Notes (Signed)
Occupational Therapy Treatment Patient Details  Name: Tiffany Jacobs MRN: [id number] Date of Birth: 22-Dec-1945  Today's Date: 01/30/2013 Time: 7829-5621 OT Time Calculation (min): 42 min Manual 1308-1326 (18') TherExercises 1326-1350 (24')  Visit#: 8 of 18  Re-eval: 02/25/13    Authorization: [employer]   Authorization Time Period: Before 17th visit  Authorization Visit#: 8 of 17  Subjective Symptoms/Limitations Symptoms: S:  I have a spot on my back that is giving me a worse time that this shoulder... I am going to have to have it taken off or something.  Pain Assessment Currently in Pain?: No/denies (for shoulder pain )   Exercise/Treatments Supine Protraction: PROM;AAROM;10 reps Horizontal ABduction: PROM;AAROM;10 reps External Rotation: PROM;AAROM;10 reps Internal Rotation: PROM;AAROM;10 reps Flexion: PROM;AAROM;10 reps ABduction: PROM;AAROM;10 reps Other Supine Exercises: bridges 20 times Seated Elevation: AROM;15 reps Extension: AROM;15 reps Row: AROM;15 reps    Therapy Ball Flexion: 25 reps ABduction: 25 reps ROM / Strengthening / Isometric Strengthening Wall Wash: 1' Prot/Ret//Elev/Dep: 10x (with min tactile cues from therapist ) Flexion: Supine;3X5" Extension: Supine;3X5" External Rotation: Supine;3X5" Internal Rotation: Supine;3X5" ABduction: Supine;3X5" ADduction: Supine;3X5"  Manual Therapy Manual Therapy: Myofascial release Myofascial Release: MFR and manual stretching to right upper arm, scapular, trapezius region to decrease pain and fascial restrictions and increase pain free mobility  Occupational Therapy Assessment and Plan OT Assessment and Plan Clinical Impression Statement: A: Patient presents wtih improved complaint of pain however with increased complaint of tenderness over scar. Applied bandaide strip of kinesiotape over wellhealed incision to assist with scar management. Patient reports MD appointment went well this date. OT  Plan: P:   Complete dowel rod in all supine ranges without assistance. Continue prot/ret//elev/dep.   Goals Short Term Goals Short Term Goal 1: Patient will be educated on HEP Short Term Goal 1 Progress: Met Short Term Goal 2: Patient will improve PROM to [facility] RUE to increase independence with ADL tasks Short Term Goal 2 Progress: Partly met Short Term Goal 3: Patient will have 3+5 strenth proximal RUE to increase ability to complete daily tasks (ie. pulling pants) Short Term Goal 3 Progress: Partly met Short Term Goal 4: Patient will report decreased shoulder pain to 4/10 during daily activities Short Term Goal 4 Progress: Partly met Short Term Goal 5: Patient wiill decrease right shoulder fascial restrictions to min-mod to increase functional reach  Short Term Goal 5 Progress: Progressing toward goal Long Term Goals Long Term Goal 1: Patient will return to hightest level of independence with all daily and leisure activities.  Long Term Goal 1 Progress: Progressing toward goal Long Term Goal 2: Patiwtn will increase AROM to [facility] in order to complete functional overhead actiivities/reaching  Long Term Goal 2 Progress: Progressing toward goal Long Term Goal 3: Patient will increase right shoulder strength to 4/5 in order to complete IADL activities Long Term Goal 3 Progress: Progressing toward goal Long Term Goal 4: Patient will decrease right shoulder fascial restrictions to trace in order to increase functional use.  Long Term Goal 4 Progress: Progressing toward goal  Problem List Patient Active Problem List   Diagnosis Date Noted  . Abscess of back 01/29/2013  . Personal history of noncompliance with medical treatment, presenting hazards to health 01/19/2013  . S/P rotator cuff repair 11/22/2012  . Insomnia 06/15/2012  . Rotator cuff tear 04/17/2012  . Hyperlipidemia LDL goal < 70 11/20/2010  . COUGH VARIANT ASTHMA 11/27/2009  . OBESITY 06/21/2009  . ESSENTIAL HYPERTENSION  03/08/2009  . FATIGUE 03/08/2009  .  SHOULDER, ARTHRITIS, DEGEN./OSTEO 08/28/2007  . RUPTURE ROTATOR CUFF 08/28/2007  . LOW BACK PAIN 06/28/2007  . SCIATICA 06/28/2007  . DM (diabetes mellitus), type 2, uncontrolled 11/28/2006    End of Session Activity Tolerance: Patient tolerated treatment well General Behavior During Therapy: [facility] for tasks assessed/performed  GO    Velora Mediate, OTR/L 01/30/2013, 4:34 PM

## 2013-01-30 NOTE — Patient Instructions (Signed)
Continue therapy

## 2013-01-30 NOTE — Telephone Encounter (Signed)
noted 

## 2013-01-30 NOTE — Progress Notes (Signed)
Patient ID: Tiffany Jacobs, female   DOB: 07-24-1945, 67 y.o.   MRN: [id number] Chief Complaint  Patient presents with  . Follow-up    6 week recheck post op right RCR s/p therapy DOS 11/09/12   This is actually postop week #2 hand R. 11 open rotator cuff repair for massive cuff tear. She's doing well her therapy is going well her passive range of motion today is 150 she has normal external rotation. She can progress in physical therapy as tolerated   Pain levels are 3-4 on occasion. She will followup for recheck in about 2 months.  Meds ordered this encounter  Medications  . HYDROcodone-acetaminophen (NORCO) 7.5-325 MG per tablet    Sig: Take 1 tablet by mouth every 4 (four) hours as needed.    Dispense:  90 tablet    Refill:  0

## 2013-02-02 NOTE — Assessment & Plan Note (Signed)
Worse with current skin infection per pt report , which is to be expected. Need I/D of absess

## 2013-02-02 NOTE — Assessment & Plan Note (Signed)
Deep abcess on upper back, mqx diameter approx 5 incches, red, and warm, Rocephin administered and doxycycline prescribed. Pt to be further evaluated and treated by general surgeon, Dr Malvin Johns as oon as she leaves the office here, appt made.

## 2013-02-02 NOTE — Assessment & Plan Note (Signed)
Elevated at this visit, no change in management, pt in pain

## 2013-02-03 ENCOUNTER — Emergency Department (HOSPITAL_COMMUNITY)
Admission: EM | Admit: 2013-02-03 | Discharge: 2013-02-03 | Disposition: A | Discharge status: Home / Self Care | Payer: Medicare Other | Attending: Emergency Medicine | Admitting: Emergency Medicine

## 2013-02-03 ENCOUNTER — Encounter (HOSPITAL_COMMUNITY): Payer: Self-pay | Admitting: Emergency Medicine

## 2013-02-03 DIAGNOSIS — Z87891 Personal history of nicotine dependence: Secondary | ICD-10-CM | POA: Insufficient documentation

## 2013-02-03 DIAGNOSIS — Z792 Long term (current) use of antibiotics: Secondary | ICD-10-CM | POA: Insufficient documentation

## 2013-02-03 DIAGNOSIS — M549 Dorsalgia, unspecified: Secondary | ICD-10-CM | POA: Insufficient documentation

## 2013-02-03 DIAGNOSIS — E119 Type 2 diabetes mellitus without complications: Secondary | ICD-10-CM | POA: Insufficient documentation

## 2013-02-03 DIAGNOSIS — IMO0002 Reserved for concepts with insufficient information to code with codable children: Secondary | ICD-10-CM | POA: Insufficient documentation

## 2013-02-03 DIAGNOSIS — I10 Essential (primary) hypertension: Secondary | ICD-10-CM | POA: Insufficient documentation

## 2013-02-03 DIAGNOSIS — Z79899 Other long term (current) drug therapy: Secondary | ICD-10-CM | POA: Insufficient documentation

## 2013-02-03 DIAGNOSIS — L723 Sebaceous cyst: Secondary | ICD-10-CM | POA: Insufficient documentation

## 2013-02-03 DIAGNOSIS — J45909 Unspecified asthma, uncomplicated: Secondary | ICD-10-CM | POA: Insufficient documentation

## 2013-02-03 DIAGNOSIS — Z7982 Long term (current) use of aspirin: Secondary | ICD-10-CM | POA: Insufficient documentation

## 2013-02-03 DIAGNOSIS — E669 Obesity, unspecified: Secondary | ICD-10-CM | POA: Insufficient documentation

## 2013-02-03 DIAGNOSIS — Z8719 Personal history of other diseases of the digestive system: Secondary | ICD-10-CM | POA: Insufficient documentation

## 2013-02-03 DIAGNOSIS — M129 Arthropathy, unspecified: Secondary | ICD-10-CM | POA: Insufficient documentation

## 2013-02-03 MED ORDER — LIDOCAINE HCL (PF) 2 % IJ SOLN
INTRAMUSCULAR | Status: AC
  Administered 2013-02-03: 5 mL
  Filled 2013-02-03: qty 10

## 2013-02-03 MED ORDER — SULFAMETHOXAZOLE-TRIMETHOPRIM 800-160 MG PO TABS: 1.0000 | ORAL_TABLET | Freq: Two times a day (BID) | ORAL | Status: DC

## 2013-02-03 NOTE — ED Notes (Signed)
Patient with no complaints at this time. Respirations even and unlabored. Skin warm/dry. Discharge instructions reviewed with patient at this time. Patient given opportunity to voice concerns/ask questions. Patient discharged at this time and left Emergency Department with steady gait.   

## 2013-02-03 NOTE — ED Notes (Signed)
Pt reports has cyst on mid back x 2 years.  Reports approx 1 week ago area became red and tender.  Reports saw Dr. Lodema Hong Friday, was given an injection of an antibiotic, and referred to a surgeon.  Pt has appt with Dr. Malvin Johns Monday.

## 2013-02-03 NOTE — ED Provider Notes (Signed)
Medical screening examination/treatment/procedure(s) were performed by non-physician practitioner and as supervising physician I was immediately available for consultation/collaboration.     Madoline Bhatt, MD 02/03/13 1505 

## 2013-02-03 NOTE — ED Provider Notes (Signed)
CSN: [id number]     Arrival date & time 02/03/13  1022 History   First MD Initiated Contact with Patient 02/03/13 1045     Chief Complaint  Patient presents with  . Abscess   (Consider location/radiation/quality/duration/timing/severity/associated sxs/prior Treatment) Patient is a 67 y.o. female presenting with abscess. The history is provided by the patient. No language interpreter was used.  Abscess Location:  Torso Torso abscess location:  Upper back Size:  8 Abscess quality: fluctuance, induration, redness and warmth   Duration: years. Progression:  Worsening Chronicity:  New Context: diabetes   Relieved by:  Nothing Worsened by:  Nothing tried Ineffective treatments:  Oral antibiotics   Past Medical History  Diagnosis Date  . Hypertension   . Diabetes mellitus, type 2   . Sciatica   . Low back pain   . Obesity   . Seasonal allergies   . Asthma   . RAD (reactive airway disease)   . GERD (gastroesophageal reflux disease)   . Arthritis    Past Surgical History  Procedure Laterality Date  . Total knee arthroplasty left  02-01-05    Dr. Romeo Apple  . Tubal ligation  1970  . Left breast biopsy for benign disease    . Shoulder open rotator cuff repair Right 11/09/2012    Procedure: ROTATOR CUFF REPAIR Right SHOULDER OPEN;  Surgeon: Vickki Hearing, MD;  Location: [facility];  Service: Orthopedics;  Laterality: Right;  . Shoulder acromioplasty Right 11/09/2012    Procedure: RIGHT SHOULDER ACROMIOPLASTY;  Surgeon: Vickki Hearing, MD;  Location: [facility];  Service: Orthopedics;  Laterality: Right;  . Acromio-clavicular joint repair Right 11/09/2012    Procedure: ACROMIO-CLAVICULAR JOINT REPAIR;  Surgeon: Vickki Hearing, MD;  Location: [facility];  Service: Orthopedics;  Laterality: Right;   Family History  Problem Relation Age of Onset  . Kidney failure Father   . Mental illness Brother   . Diabetes Brother   . Hypertension Brother   . Asthma    . Lung disease    .  Cancer    . Heart disease    . Arthritis     History  Substance Use Topics  . Smoking status: Former Smoker -- 1.00 packs/day for 30 years    Types: Cigarettes  . Smokeless tobacco: Former Neurosurgeon    Quit date: 11/02/1988  . Alcohol Use: No   OB History   Grav Para Term Preterm Abortions TAB SAB Ect Mult Living                 Review of Systems  Musculoskeletal: Positive for back pain.  Skin: Positive for wound.    Allergies  Ace inhibitors  Home Medications   Current Outpatient Rx  Name  Route  Sig  Dispense  Refill  . albuterol (PROVENTIL HFA;VENTOLIN HFA) 108 (90 BASE) MCG/ACT inhaler   Inhalation   Inhale 2 puffs into the lungs every 6 (six) hours as needed for wheezing.   18 g   3     pls dispense preferred medication, per the insuran ...   . amLODipine (NORVASC) 10 MG tablet   Oral   Take 1 tablet (10 mg total) by mouth daily.   30 tablet   5   . aspirin (ASPIRIN LOW DOSE) 81 MG EC tablet   Oral   Take 81 mg by mouth every morning.          . calcium-vitamin D (OSCAL 500/200 D-3) 500-200 MG-UNIT per tablet   Oral  Take 1 tablet by mouth 2 (two) times daily.   180 tablet   3   . Dapagliflozin Propanediol (FARXIGA) 5 MG TABS   Oral   Take 1 tablet by mouth daily.   30 tablet   2     [id number] Exp 10/2013   . doxycycline (VIBRAMYCIN) 100 MG capsule   Oral   Take 1 capsule (100 mg total) by mouth 2 (two) times daily.   20 capsule   0   . fluticasone (FLONASE) 50 MCG/ACT nasal spray   Nasal   Place 2 sprays into the nose daily.         Marland Kitchen glipiZIDE (GLUCOTROL XL) 2.5 MG 24 hr tablet   Oral   Take 1 tablet (2.5 mg total) by mouth daily with breakfast.   30 tablet   5   . glipiZIDE (GLUCOTROL) 10 MG tablet   Oral   Take 1 tablet (10 mg total) by mouth daily.   30 tablet   5   . HYDROcodone-acetaminophen (NORCO) 7.5-325 MG per tablet   Oral   Take 1 tablet by mouth every 4 (four) hours as needed.   90 tablet   0   . hydrOXYzine  (ATARAX/VISTARIL) 25 MG tablet      TAKE ONE TABLET BY MOUTH AT NIGHT FOR ITCHING.   30 tablet   3   . ibuprofen (ADVIL,MOTRIN) 200 MG tablet   Oral   Take 200 mg by mouth every 6 (six) hours as needed for pain.         Marland Kitchen ipratropium-albuterol (DUONEB) 0.5-2.5 (3) MG/3ML SOLN   Nebulization   Take 3 mLs by nebulization. Inhale 1 vial thru nebulizer every 6-8 hours as needed for wheezing          . Lancets (ACCU-CHEK MULTICLIX) lancets      USE AS DIRECTED TO TEST BLOOD SUGAR ONCE DAILY.   102 each   2   . metFORMIN (GLUCOPHAGE) 1000 MG tablet   Oral   Take 1 tablet (1,000 mg total) by mouth 2 (two) times daily with a meal.   60 tablet   5   . pravastatin (PRAVACHOL) 40 MG tablet      TAKE (1) TABLET BY MOUTH AT BEDTIME FOR CHOLESTEROL.   30 tablet   3   . spironolactone (ALDACTONE) 50 MG tablet   Oral   Take 1 tablet (50 mg total) by mouth daily.   30 tablet   11     Dose increase effective 06/15/2012  Discontinue  ...   . sulfamethoxazole-trimethoprim (SEPTRA DS) 800-160 MG per tablet   Oral   Take 1 tablet by mouth 2 (two) times daily.   20 tablet   0   . zolpidem (AMBIEN) 10 MG tablet      TAKE (1) TABLET BY MOUTH AT BEDTIME AS NEEDED FOR SLEEP.   30 tablet   2    BP 138/65  Pulse 112  Temp(Src) 98.8 F (37.1 C) (Oral)  Resp 18  Ht 5\' 2"  (1.575 m)  Wt 220 lb (99.791 kg)  BMI 40.23 kg/m2  SpO2 95% Physical Exam  Nursing note and vitals reviewed. Constitutional: She is oriented to person, place, and time. She appears well-developed and well-nourished.  HENT:  Head: Normocephalic.  Eyes: EOM are normal. Pupils are equal, round, and reactive to light.  Neck: Normal range of motion.  Pulmonary/Chest: Effort normal.  Abdominal: She exhibits no distension.  Musculoskeletal: Normal range of motion.  Neurological:  She is alert and oriented to person, place, and time.  Skin: There is erythema.  Psychiatric: She has a normal mood and affect.     ED Course  INCISION AND DRAINAGE Date/Time: 02/03/2013 11:26 AM Performed by: Elson Areas Authorized by: Elson Areas Consent: Verbal consent obtained. Risks and benefits: risks, benefits and alternatives were discussed Patient identity confirmed: verbally with patient Time out: Immediately prior to procedure a "time out" was called to verify the correct patient, procedure, equipment, support staff and site/side marked as required. Type: cyst Body area: trunk Anesthesia: local infiltration Local anesthetic: lidocaine 2% without epinephrine Scalpel size: 11 Incision type: single straight Complexity: complex Drainage: purulent Drainage amount: moderate Wound treatment: wound left open Packing material: 1/2 in gauze Patient tolerance: Patient tolerated the procedure well with no immediate complications.   (including critical care time) Labs Review Labs Reviewed - No data to display Imaging Review No results found.  EKG Interpretation   None       MDM   1. Sebaceous cyst    Bactrim,   Warm compresses    Elson Areas, PA-C 02/03/13 1127

## 2013-02-03 NOTE — ED Notes (Signed)
Patient has had soft cyst x 1 year, known to Dr. Lodema Hong.  Over last week, site has become hard and tender, w/out drainage.  Saw Dr. Lodema Hong Tuesday and given Rocephin injection.  Went to see Dr. Malvin Johns on referral same day and was given rx for doxycycline; however, she did not have it filled d/t cost.  He planned to schedule appointment after T'giving to remove abscess.  Today, cannot tolerate the pain and has been unable to lay on back.

## 2013-02-05 ENCOUNTER — Encounter (HOSPITAL_COMMUNITY): Payer: Self-pay | Admitting: Emergency Medicine

## 2013-02-05 ENCOUNTER — Emergency Department (HOSPITAL_COMMUNITY)
Admission: EM | Admit: 2013-02-05 | Discharge: 2013-02-05 | Disposition: A | Discharge status: Home / Self Care | Payer: Medicare Other | Attending: Emergency Medicine | Admitting: Emergency Medicine

## 2013-02-05 DIAGNOSIS — I10 Essential (primary) hypertension: Secondary | ICD-10-CM | POA: Insufficient documentation

## 2013-02-05 DIAGNOSIS — Z7982 Long term (current) use of aspirin: Secondary | ICD-10-CM | POA: Insufficient documentation

## 2013-02-05 DIAGNOSIS — Z87891 Personal history of nicotine dependence: Secondary | ICD-10-CM | POA: Insufficient documentation

## 2013-02-05 DIAGNOSIS — M129 Arthropathy, unspecified: Secondary | ICD-10-CM | POA: Insufficient documentation

## 2013-02-05 DIAGNOSIS — J45909 Unspecified asthma, uncomplicated: Secondary | ICD-10-CM | POA: Insufficient documentation

## 2013-02-05 DIAGNOSIS — Z4801 Encounter for change or removal of surgical wound dressing: Secondary | ICD-10-CM | POA: Insufficient documentation

## 2013-02-05 DIAGNOSIS — Z4800 Encounter for change or removal of nonsurgical wound dressing: Secondary | ICD-10-CM

## 2013-02-05 DIAGNOSIS — Z79899 Other long term (current) drug therapy: Secondary | ICD-10-CM | POA: Insufficient documentation

## 2013-02-05 DIAGNOSIS — E119 Type 2 diabetes mellitus without complications: Secondary | ICD-10-CM | POA: Insufficient documentation

## 2013-02-05 DIAGNOSIS — E669 Obesity, unspecified: Secondary | ICD-10-CM | POA: Insufficient documentation

## 2013-02-05 DIAGNOSIS — Z8719 Personal history of other diseases of the digestive system: Secondary | ICD-10-CM | POA: Insufficient documentation

## 2013-02-05 NOTE — ED Provider Notes (Signed)
Medical screening examination/treatment/procedure(s) were performed by non-physician practitioner and as supervising physician I was immediately available for consultation/collaboration.  EKG Interpretation   None         Eashan Schipani N Lyndzie Zentz, DO 02/05/13 1402 

## 2013-02-05 NOTE — ED Notes (Signed)
Abscess packing removal from upper back x 2 days.

## 2013-02-05 NOTE — ED Provider Notes (Signed)
CSN: [id number]     Arrival date & time 02/05/13  1002 History   First MD Initiated Contact with Patient 02/05/13 1019     Chief Complaint  Patient presents with  . Wound Check   (Consider location/radiation/quality/duration/timing/severity/associated sxs/prior Treatment) HPI Comments: Pt is a 67 y/o female who presents to the ED for packing removal from an abscess that was drained and packed 2 days ago on 11/30. Pt states the area is slightly tender, but improved from before it was drained. Denies fever, chills, n/v. She is taking Bactrim as prescribed.  Patient is a 67 y.o. female presenting with wound check. The history is provided by the patient.  Wound Check    Past Medical History  Diagnosis Date  . Hypertension   . Diabetes mellitus, type 2   . Sciatica   . Low back pain   . Obesity   . Seasonal allergies   . Asthma   . RAD (reactive airway disease)   . GERD (gastroesophageal reflux disease)   . Arthritis    Past Surgical History  Procedure Laterality Date  . Total knee arthroplasty left  02-01-05    Dr. Romeo Apple  . Tubal ligation  1970  . Left breast biopsy for benign disease    . Shoulder open rotator cuff repair Right 11/09/2012    Procedure: ROTATOR CUFF REPAIR Right SHOULDER OPEN;  Surgeon: Vickki Hearing, MD;  Location: [facility];  Service: Orthopedics;  Laterality: Right;  . Shoulder acromioplasty Right 11/09/2012    Procedure: RIGHT SHOULDER ACROMIOPLASTY;  Surgeon: Vickki Hearing, MD;  Location: [facility];  Service: Orthopedics;  Laterality: Right;  . Acromio-clavicular joint repair Right 11/09/2012    Procedure: ACROMIO-CLAVICULAR JOINT REPAIR;  Surgeon: Vickki Hearing, MD;  Location: [facility];  Service: Orthopedics;  Laterality: Right;   Family History  Problem Relation Age of Onset  . Kidney failure Father   . Mental illness Brother   . Diabetes Brother   . Hypertension Brother   . Asthma    . Lung disease    . Cancer    . Heart disease    .  Arthritis     History  Substance Use Topics  . Smoking status: Former Smoker -- 1.00 packs/day for 30 years    Types: Cigarettes  . Smokeless tobacco: Former Neurosurgeon    Quit date: 11/02/1988  . Alcohol Use: No   OB History   Grav Para Term Preterm Abortions TAB SAB Ect Mult Living                 Review of Systems  Skin:       Positive for abscess on back.  All other systems reviewed and are negative.    Allergies  Ace inhibitors  Home Medications   Current Outpatient Rx  Name  Route  Sig  Dispense  Refill  . albuterol (PROVENTIL HFA;VENTOLIN HFA) 108 (90 BASE) MCG/ACT inhaler   Inhalation   Inhale 2 puffs into the lungs every 6 (six) hours as needed for wheezing.   18 g   3     pls dispense preferred medication, per the insuran ...   . amLODipine (NORVASC) 10 MG tablet   Oral   Take 1 tablet (10 mg total) by mouth daily.   30 tablet   5   . aspirin (ASPIRIN LOW DOSE) 81 MG EC tablet   Oral   Take 81 mg by mouth every morning.          Marland Kitchen  calcium-vitamin D (OSCAL 500/200 D-3) 500-200 MG-UNIT per tablet   Oral   Take 1 tablet by mouth 2 (two) times daily.   180 tablet   3   . Dapagliflozin Propanediol (FARXIGA) 5 MG TABS   Oral   Take 1 tablet by mouth daily.   30 tablet   2     [id number] Exp 10/2013   . doxycycline (VIBRAMYCIN) 100 MG capsule   Oral   Take 1 capsule (100 mg total) by mouth 2 (two) times daily.   20 capsule   0   . fluticasone (FLONASE) 50 MCG/ACT nasal spray   Nasal   Place 2 sprays into the nose daily.         Marland Kitchen glipiZIDE (GLUCOTROL XL) 2.5 MG 24 hr tablet   Oral   Take 1 tablet (2.5 mg total) by mouth daily with breakfast.   30 tablet   5   . glipiZIDE (GLUCOTROL) 10 MG tablet   Oral   Take 1 tablet (10 mg total) by mouth daily.   30 tablet   5   . HYDROcodone-acetaminophen (NORCO) 7.5-325 MG per tablet   Oral   Take 1 tablet by mouth every 4 (four) hours as needed.   90 tablet   0   . hydrOXYzine  (ATARAX/VISTARIL) 25 MG tablet      TAKE ONE TABLET BY MOUTH AT NIGHT FOR ITCHING.   30 tablet   3   . ibuprofen (ADVIL,MOTRIN) 200 MG tablet   Oral   Take 200 mg by mouth every 6 (six) hours as needed for pain.         Marland Kitchen ipratropium-albuterol (DUONEB) 0.5-2.5 (3) MG/3ML SOLN   Nebulization   Take 3 mLs by nebulization. Inhale 1 vial thru nebulizer every 6-8 hours as needed for wheezing          . Lancets (ACCU-CHEK MULTICLIX) lancets      USE AS DIRECTED TO TEST BLOOD SUGAR ONCE DAILY.   102 each   2   . metFORMIN (GLUCOPHAGE) 1000 MG tablet   Oral   Take 1 tablet (1,000 mg total) by mouth 2 (two) times daily with a meal.   60 tablet   5   . pravastatin (PRAVACHOL) 40 MG tablet      TAKE (1) TABLET BY MOUTH AT BEDTIME FOR CHOLESTEROL.   30 tablet   3   . spironolactone (ALDACTONE) 50 MG tablet   Oral   Take 1 tablet (50 mg total) by mouth daily.   30 tablet   11     Dose increase effective 06/15/2012  Discontinue  ...   . sulfamethoxazole-trimethoprim (SEPTRA DS) 800-160 MG per tablet   Oral   Take 1 tablet by mouth 2 (two) times daily.   20 tablet   0   . zolpidem (AMBIEN) 10 MG tablet      TAKE (1) TABLET BY MOUTH AT BEDTIME AS NEEDED FOR SLEEP.   30 tablet   2    BP 131/70  Pulse 93  Temp(Src) 98.4 F (36.9 C) (Oral)  Resp 18  Ht 5\' 2"  (1.575 m)  Wt 220 lb (99.791 kg)  BMI 40.23 kg/m2  SpO2 93% Physical Exam  Nursing note and vitals reviewed. Constitutional: She is oriented to person, place, and time. She appears well-developed and well-nourished. No distress.  HENT:  Head: Normocephalic and atraumatic.  Mouth/Throat: Oropharynx is clear and moist.  Eyes: Conjunctivae are normal.  Neck: Normal range of motion. Neck  supple.  Cardiovascular: Normal rate, regular rhythm and normal heart sounds.   Pulmonary/Chest: Effort normal and breath sounds normal.  Musculoskeletal: Normal range of motion. She exhibits no edema.  Neurological: She is  alert and oriented to person, place, and time.  Skin: Skin is warm and dry. She is not diaphoretic.     Psychiatric: She has a normal mood and affect. Her behavior is normal.    ED Course  Procedures (including critical care time) Packing removed from abscess on back. Labs Review Labs Reviewed - No data to display Imaging Review No results found.  EKG Interpretation   None       MDM   1. Abscess packing removal    Well-healing abscess. No further drainage, no further I&D needed at this time. Packing removed. Continue abx as prescribed. F/u with PCP. Return precautions given. Patient states understanding of treatment care plan and is agreeable.    Trevor Mace, PA-C 02/05/13 1037

## 2013-02-06 ENCOUNTER — Ambulatory Visit (HOSPITAL_COMMUNITY)
Admission: RE | Admit: 2013-02-06 | Discharge: 2013-02-06 | Disposition: A | Discharge status: Home / Self Care | Payer: Medicare Other | Source: Ambulatory Visit | Attending: Orthopedic Surgery | Admitting: Orthopedic Surgery

## 2013-02-06 DIAGNOSIS — M25519 Pain in unspecified shoulder: Secondary | ICD-10-CM | POA: Insufficient documentation

## 2013-02-06 DIAGNOSIS — M25619 Stiffness of unspecified shoulder, not elsewhere classified: Secondary | ICD-10-CM | POA: Insufficient documentation

## 2013-02-06 DIAGNOSIS — I10 Essential (primary) hypertension: Secondary | ICD-10-CM | POA: Insufficient documentation

## 2013-02-06 DIAGNOSIS — E119 Type 2 diabetes mellitus without complications: Secondary | ICD-10-CM | POA: Insufficient documentation

## 2013-02-06 DIAGNOSIS — IMO0001 Reserved for inherently not codable concepts without codable children: Secondary | ICD-10-CM | POA: Insufficient documentation

## 2013-02-06 NOTE — Progress Notes (Signed)
Occupational Therapy Treatment Patient Details  Name: Tiffany Jacobs MRN: [id number] Date of Birth: 03-05-1946  Today's Date: 02/06/2013 Time: 4098-1191 OT Time Calculation (min): 45 min Manual 1303-1315 (12') TherExercises 1315-1348 (33')  Visit#: 9 of 18  Re-eval: 02/25/13    Authorization: [facility] Medicare   Authorization Time Period: Before 17th visit  Authorization Visit#: 9 of 17  Subjective Symptoms/Limitations Symptoms: S: I thiink everything is good with my arm...  Pain Assessment Pain Score: 1  Pain Location: Shoulder Pain Orientation: Right Pain Type: Acute pain Multiple Pain Sites: No   Exercise/Treatments Supine Protraction: PROM;10 reps;AAROM;12 reps Horizontal ABduction: PROM;10 reps;AAROM;12 reps External Rotation: PROM;10 reps;AAROM;12 reps Internal Rotation: PROM;10 reps;AAROM;12 reps Flexion: PROM;10 reps;AAROM;12 reps ABduction: PROM;10 reps;AAROM;12 reps Other Supine Exercises: bridges 20 times Seated Elevation: AROM;15 reps Extension: AROM;15 reps Row: AROM;15 reps PStanding Extension: AROM   Therapy Ball Flexion: 25 reps ABduction: 25 reps Right/Left: 5 reps ROM / Strengthening / Isometric Strengthening Wall Wash: 2' Prot/Ret//Elev/Dep: 1' Flexion: Supine;3X5" Extension: Supine;3X5" External Rotation: Supine;3X5" Internal Rotation: Supine;3X5" ABduction: Supine;3X5" ADduction: Supine;3X5"      Manual Therapy Manual Therapy: Myofascial release Myofascial Release: MFR and manual stretching to right upper arm, scapular, trapezius region to decrease pain and fascial restrictions and increase pain free mobility  Occupational Therapy Assessment and Plan OT Assessment and Plan Clinical Impression Statement: A:  Patient reports continued difficulty with pulling pants up in back this date.  arrives with decreased pain and reports good follow throught with HEP.  Patient tolerated right/left with ball this date with moderate difficulty and  increased complaint of discomfort during activity.   OT Plan: P:   Complete dowel rod in all supine ranges without assistance. Continue prot/ret//elev/dep.   Goals Short Term Goals Short Term Goal 1: Patient will be educated on HEP Short Term Goal 1 Progress: Met Short Term Goal 2: Patient will improve PROM to [facility] RUE to increase independence with ADL tasks Short Term Goal 2 Progress: Partly met Short Term Goal 3: Patient will have 3+5 strenth proximal RUE to increase ability to complete daily tasks (ie. pulling pants) Short Term Goal 3 Progress: Partly met Short Term Goal 4: Patient will report decreased shoulder pain to 4/10 during daily activities Short Term Goal 4 Progress: Partly met Short Term Goal 5: Patient wiill decrease right shoulder fascial restrictions to min-mod to increase functional reach  Short Term Goal 5 Progress: Progressing toward goal Long Term Goals Long Term Goal 1: Patient will return to hightest level of independence with all daily and leisure activities.  Long Term Goal 1 Progress: Progressing toward goal Long Term Goal 2: Patiwtn will increase AROM to [facility] in order to complete functional overhead actiivities/reaching  Long Term Goal 2 Progress: Progressing toward goal Long Term Goal 3: Patient will increase right shoulder strength to 4/5 in order to complete IADL activities Long Term Goal 3 Progress: Progressing toward goal Long Term Goal 4: Patient will decrease right shoulder fascial restrictions to trace in order to increase functional use.  Long Term Goal 4 Progress: Progressing toward goal  Problem List Patient Active Problem List   Diagnosis Date Noted  . Abscess of back 01/29/2013  . Personal history of noncompliance with medical treatment, presenting hazards to health 01/19/2013  . S/P rotator cuff repair 11/22/2012  . Insomnia 06/15/2012  . Rotator cuff tear 04/17/2012  . Hyperlipidemia LDL goal < 70 11/20/2010  . COUGH VARIANT ASTHMA 11/27/2009   . OBESITY 06/21/2009  . ESSENTIAL HYPERTENSION 03/08/2009  .  FATIGUE 03/08/2009  . SHOULDER, ARTHRITIS, DEGEN./OSTEO 08/28/2007  . RUPTURE ROTATOR CUFF 08/28/2007  . LOW BACK PAIN 06/28/2007  . SCIATICA 06/28/2007  . DM (diabetes mellitus), type 2, uncontrolled 11/28/2006    End of Session Activity Tolerance: Patient tolerated treatment well General Behavior During Therapy: [facility] for tasks assessed/performed  GO    Velora Mediate, OTR/L  02/06/2013, 1:53 PM

## 2013-02-07 ENCOUNTER — Ambulatory Visit (HOSPITAL_COMMUNITY)
Admission: RE | Admit: 2013-02-07 | Discharge: 2013-02-07 | Disposition: A | Discharge status: Home / Self Care | Payer: Medicare Other | Source: Ambulatory Visit | Attending: Occupational Therapy | Admitting: Occupational Therapy

## 2013-02-07 NOTE — Progress Notes (Signed)
Occupational Therapy Treatment Patient Details  Name: KAYTE BORCHARD MRN: [id number] Date of Birth: 07/30/1945  Today's Date: 02/07/2013 Time: 4098-1191 OT Time Calculation (min): 39 min  Manual 1356-1408 (12') TherExercises 4782-9562 (27')    Visit#: 10 of 18  Re-eval: 02/25/13    Authorization: [employer] Medicare   Authorization Time Period: Before 17th visit  Authorization Visit#: 10 of 17  Subjective Symptoms/Limitations Symptoms: S:  I have been rushing around all day  that is why I am a little late but ready to work on this arm  Pain Assessment Currently in Pain?: No/denies  Precautions/Restrictions     Exercise/Treatments Supine Protraction: AAROM;15 reps Horizontal ABduction: AAROM;15 reps External Rotation: AAROM;15 reps Internal Rotation: AAROM;15 reps Flexion: AAROM;15 reps ABduction: AAROM;15 reps Other Supine Exercises: bridges 20 times Standing Extension: AROM;12 reps Pulleys   Therapy Ball Flexion: 25 reps ABduction: 25 reps Right/Left: 5 reps ROM / Strengthening / Isometric Strengthening Proximal Shoulder Strengthening, Supine: 1' Flexion: Supine;3X5" Extension: Supine;3X5" External Rotation: Supine;3X5" Internal Rotation: Supine;3X5" ABduction: Supine;3X5" ADduction: Supine;3X5"       Manual Therapy Manual Therapy: Myofascial release Myofascial Release: MFR and manual stretching to right upper arm, scapular, trapezius region to decrease pain and fascial restrictions and increase pain free mobility Weight Bearing Technique Weight Bearing Technique: No  Occupational Therapy Assessment and Plan OT Assessment and Plan Clinical Impression Statement: A:  Patient reports increased ability to perform pulling pants/underwear and toileting hygiene easier this date from stretches yesterday.  Patient reports increased fatigue this date.  OT Plan: P:   Complete dowel rod in all supine ranges without assistance. Continue prot/ret//elev/dep.    Goals Short Term Goals Short Term Goal 1: Patient will be educated on HEP Short Term Goal 1 Progress: Met Short Term Goal 2: Patient will improve PROM to [facility] RUE to increase independence with ADL tasks Short Term Goal 2 Progress: Partly met Short Term Goal 3: Patient will have 3+5 strenth proximal RUE to increase ability to complete daily tasks (ie. pulling pants) Short Term Goal 3 Progress: Partly met Short Term Goal 4: Patient will report decreased shoulder pain to 4/10 during daily activities Short Term Goal 4 Progress: Partly met Short Term Goal 5: Patient wiill decrease right shoulder fascial restrictions to min-mod to increase functional reach  Short Term Goal 5 Progress: Progressing toward goal Long Term Goals Long Term Goal 1: Patient will return to hightest level of independence with all daily and leisure activities.  Long Term Goal 1 Progress: Progressing toward goal Long Term Goal 2: Patiwtn will increase AROM to [place] in order to complete functional overhead actiivities/reaching  Long Term Goal 2 Progress: Progressing toward goal Long Term Goal 3: Patient will increase right shoulder strength to 4/5 in order to complete IADL activities Long Term Goal 3 Progress: Progressing toward goal Long Term Goal 4: Patient will decrease right shoulder fascial restrictions to trace in order to increase functional use.  Long Term Goal 4 Progress: Progressing toward goal  Problem List Patient Active Problem List   Diagnosis Date Noted  . Abscess of back 01/29/2013  . Personal history of noncompliance with medical treatment, presenting hazards to health 01/19/2013  . S/P rotator cuff repair 11/22/2012  . Insomnia 06/15/2012  . Rotator cuff tear 04/17/2012  . Hyperlipidemia LDL goal < 70 11/20/2010  . COUGH VARIANT ASTHMA 11/27/2009  . OBESITY 06/21/2009  . ESSENTIAL HYPERTENSION 03/08/2009  . FATIGUE 03/08/2009  . SHOULDER, ARTHRITIS, DEGEN./OSTEO 08/28/2007  . RUPTURE ROTATOR CUFF  08/28/2007  .  LOW BACK PAIN 06/28/2007  . SCIATICA 06/28/2007  . DM (diabetes mellitus), type 2, uncontrolled 11/28/2006    End of Session Activity Tolerance: Patient tolerated treatment well General Behavior During Therapy: [facility] for tasks assessed/performed  GO    Velora Mediate, OTR/L  02/07/2013, 2:39 PM

## 2013-02-12 ENCOUNTER — Encounter: Payer: Self-pay | Admitting: Orthopedic Surgery

## 2013-02-13 ENCOUNTER — Ambulatory Visit (HOSPITAL_COMMUNITY)
Admission: RE | Admit: 2013-02-13 | Discharge: 2013-02-13 | Disposition: A | Discharge status: Home / Self Care | Payer: Medicare Other | Source: Ambulatory Visit | Attending: Family Medicine | Admitting: Family Medicine

## 2013-02-15 ENCOUNTER — Ambulatory Visit (HOSPITAL_COMMUNITY)
Admission: RE | Admit: 2013-02-15 | Discharge: 2013-02-15 | Disposition: A | Discharge status: Home / Self Care | Payer: Medicare Other | Source: Ambulatory Visit | Attending: Occupational Therapy | Admitting: Occupational Therapy

## 2013-02-15 NOTE — Progress Notes (Signed)
Occupational Therapy Treatment Patient Details  Name: Tiffany Jacobs MRN: [id number] Date of Birth: 1946/02/16  Today's Date: 02/13/2013 Time: 1884-1660 OT Time Calculation (min): 42 min Manual 1350-1405 (15') TherExercises 6301-6010 (27')  Visit#: 11 of 18  Re-eval: 02/25/13    Authorization: [employer] Medicare   Authorization Time Period: Before 17th visit  Authorization Visit#: 11 of 17  Subjective Symptoms/Limitations Symptoms: S:  It has been feeling like this all morning. Pain Assessment Currently in Pain?: Yes Pain Score: 2  Pain Location: Shoulder Pain Orientation: Right Pain Type: Acute pain Multiple Pain Sites: No      Exercise/Treatments Supine Protraction: AAROM;15 reps;Weights Protraction Weight (lbs): 2 Horizontal ABduction: AAROM;15 reps;Weights Horizontal ABduction Weight (lbs): 2 External Rotation: AAROM;15 reps;Weights External Rotation Weight (lbs): 2 Internal Rotation: AAROM;15 reps;Weights Internal Rotation Weight (lbs): 2 Flexion: AAROM;15 reps;Weights Shoulder Flexion Weight (lbs): 2 ABduction: AAROM;15 reps;Weights Shoulder ABduction Weight (lbs): 2 Seated Elevation: AROM;15 reps Extension: AROM;15 reps Row: AROM;15 reps   Therapy Ball Right/Left: 5 reps ROM / Strengthening / Isometric Strengthening UBE (Upper Arm Bike): 2' forward and 2' reverse, level 1.0 Proximal Shoulder Strengthening, Supine: 1.5'   Stretches Other Shoulder Stretches: wall ladder holes x 5 reps to #12 up/down        Manual Therapy Manual Therapy: Myofascial release Myofascial Release: MFR and manual stretching to right upper arm, scapular, trapezius region to decrease pain and fascial restrictions and increase pain free mobility Weight Bearing Technique Weight Bearing Technique: No  Occupational Therapy Assessment and Plan OT Assessment and Plan Clinical Impression Statement: A:  Patient demos difficulty with wall ladder this date with increased fatigue  at end of session.  Patient cont wtih anterior shoulder pain with flexion and cont report difficulty pulliing pants.   OT Plan: P: continue prot/ret/elev/dep, wall ladder, initiate theraband   Goals Short Term Goals Short Term Goal 1: Patient will be educated on HEP Short Term Goal 1 Progress: Met Short Term Goal 2: Patient will improve PROM to [facility] RUE to increase independence with ADL tasks Short Term Goal 2 Progress: Partly met Short Term Goal 3: Patient will have 3+5 strenth proximal RUE to increase ability to complete daily tasks (ie. pulling pants) Short Term Goal 3 Progress: Partly met Short Term Goal 4: Patient will report decreased shoulder pain to 4/10 during daily activities Short Term Goal 4 Progress: Partly met Short Term Goal 5: Patient wiill decrease right shoulder fascial restrictions to min-mod to increase functional reach  Short Term Goal 5 Progress: Progressing toward goal Long Term Goals Long Term Goal 1: Patient will return to hightest level of independence with all daily and leisure activities.  Long Term Goal 1 Progress: Progressing toward goal Long Term Goal 2: Patiwtn will increase AROM to [facility] in order to complete functional overhead actiivities/reaching  Long Term Goal 2 Progress: Progressing toward goal Long Term Goal 3: Patient will increase right shoulder strength to 4/5 in order to complete IADL activities Long Term Goal 3 Progress: Progressing toward goal Long Term Goal 4: Patient will decrease right shoulder fascial restrictions to trace in order to increase functional use.  Long Term Goal 4 Progress: Progressing toward goal  Problem List Patient Active Problem List   Diagnosis Date Noted  . Abscess of back 01/29/2013  . Personal history of noncompliance with medical treatment, presenting hazards to health 01/19/2013  . S/P rotator cuff repair 11/22/2012  . Insomnia 06/15/2012  . Rotator cuff tear 04/17/2012  . Hyperlipidemia LDL goal < 70  11/20/2010   . COUGH VARIANT ASTHMA 11/27/2009  . OBESITY 06/21/2009  . ESSENTIAL HYPERTENSION 03/08/2009  . FATIGUE 03/08/2009  . SHOULDER, ARTHRITIS, DEGEN./OSTEO 08/28/2007  . RUPTURE ROTATOR CUFF 08/28/2007  . LOW BACK PAIN 06/28/2007  . SCIATICA 06/28/2007  . DM (diabetes mellitus), type 2, uncontrolled 11/28/2006    End of Session Activity Tolerance: Patient tolerated treatment well  GO    Velora Mediate, OTR/l  02/13/2013, 2:55 PM

## 2013-02-15 NOTE — Progress Notes (Signed)
Occupational Therapy Treatment Patient Details  Name: Tiffany Jacobs MRN: [id number] Date of Birth: [birth year]  Today's Date: 02/15/2013 Time: 4098-1191 OT Time Calculation (min): 40 min Manual 1305-1317 (12') TherExercises 1317-1345 (28')  Visit#: 12 of 18  Re-eval: 02/25/13    Authorization: [facility] Medicare   Authorization Time Period: Before 17th visit  Authorization Visit#: 12 of 17  Subjective Symptoms/Limitations Symptoms: S:  I am not having any pain today... i didnt even take a pain pill.   Pain Assessment Currently in Pain?: No/denies Pain Score: 2  Pain Location: Shoulder Pain Orientation: Right Pain Type: Acute pain Multiple Pain Sites: No     Exercise/Treatments Supine Protraction: AAROM;15 reps;Weights Protraction Weight (lbs): 2 Horizontal ABduction: AAROM;15 reps;Weights Horizontal ABduction Weight (lbs): 2 External Rotation: AAROM;15 reps;Weights External Rotation Weight (lbs): 2 Internal Rotation: AAROM;15 reps;Weights Internal Rotation Weight (lbs): 2 Flexion: AAROM;15 reps;Weights Shoulder Flexion Weight (lbs): 2 ABduction: AAROM;15 reps;Weights Shoulder ABduction Weight (lbs): 2 Seated Elevation: AROM;15 reps Extension: AROM;15 reps Row: AROM;15 reps   Standing External Rotation: AROM;10 reps;Theraband Theraband Level (Shoulder External Rotation): Level 2 (Red) Internal Rotation: AROM;10 reps;Theraband Theraband Level (Shoulder Internal Rotation): Level 2 (Red) Flexion: AROM;12 reps (to 90 degreees ) ABduction: AROM;12 reps (to 90 degrees ) Extension: AROM;12 reps;Weights;Theraband Theraband Level (Shoulder Extension): Level 2 (Red) Extension Weight (lbs): 2 Row: AROM;10 reps;Theraband Theraband Level (Shoulder Row): Level 2 (Red) Other Standing Exercises: extension with IR wtih 2# weight bar x 12 reps (to increase independence with pulling pants )   Therapy Ball Right/Left: 5 reps ROM / Strengthening / Isometric  Strengthening UBE (Upper Arm Bike): 2' forward and 2' reverse, level 1.0 Proximal Shoulder Strengthening, Supine: 1.5' Ball on Wall: 1' Prot/Ret//Elev/Dep: 1'   Stretches Other Shoulder Stretches: wall ladder holes x 5 reps to #12 up/down in flexion plane;  up ladder x 3 with removing at top holding x 7"        Manual Therapy Manual Therapy: Myofascial release Myofascial Release: MFR and manual stretching to right upper arm, scapular, trapezius region to decrease pain and fascial restrictions and increase pain free mobility Weight Bearing Technique Weight Bearing Technique: No  Occupational Therapy Assessment and Plan OT Assessment and Plan Clinical Impression Statement: A:  Patient arrived with no complaint of pain this date.  She continues with tenderness in anterior shoulder with flexion activities. Initiated theraband exercises this date with min difficulty and complaint of increased fatigue.  OT Plan: P:  overhead reaching and sustained activity, hold arm off wall at end of ladder x 10" for at least 1', theraband.   Goals Short Term Goals Short Term Goal 1: Patient will be educated on HEP Short Term Goal 1 Progress: Met Short Term Goal 2: Patient will improve PROM to [facility] RUE to increase independence with ADL tasks Short Term Goal 2 Progress: Met Short Term Goal 3: Patient will have 3+5 strenth proximal RUE to increase ability to complete daily tasks (ie. pulling pants) Short Term Goal 3 Progress: Partly met Short Term Goal 4: Patient will report decreased shoulder pain to 4/10 during daily activities Short Term Goal 4 Progress: Partly met Short Term Goal 5: Patient wiill decrease right shoulder fascial restrictions to min-mod to increase functional reach  Short Term Goal 5 Progress: Progressing toward goal Long Term Goals Long Term Goal 1: Patient will return to hightest level of independence with all daily and leisure activities.  Long Term Goal 1 Progress: Progressing  toward goal Long Term Goal 2: Patiwtn will  increase AROM to [facility] in order to complete functional overhead actiivities/reaching  Long Term Goal 2 Progress: Progressing toward goal Long Term Goal 3: Patient will increase right shoulder strength to 4/5 in order to complete IADL activities Long Term Goal 3 Progress: Progressing toward goal Long Term Goal 4: Patient will decrease right shoulder fascial restrictions to trace in order to increase functional use.  Long Term Goal 4 Progress: Progressing toward goal  Problem List Patient Active Problem List   Diagnosis Date Noted  . Abscess of back 01/29/2013  . Personal history of noncompliance with medical treatment, presenting hazards to health 01/19/2013  . S/P rotator cuff repair 11/22/2012  . Insomnia 06/15/2012  . Rotator cuff tear 04/17/2012  . Hyperlipidemia LDL goal < 70 11/20/2010  . COUGH VARIANT ASTHMA 11/27/2009  . OBESITY 06/21/2009  . ESSENTIAL HYPERTENSION 03/08/2009  . FATIGUE 03/08/2009  . SHOULDER, ARTHRITIS, DEGEN./OSTEO 08/28/2007  . RUPTURE ROTATOR CUFF 08/28/2007  . LOW BACK PAIN 06/28/2007  . SCIATICA 06/28/2007  . DM (diabetes mellitus), type 2, uncontrolled 11/28/2006    End of Session Activity Tolerance: Patient tolerated treatment well General Behavior During Therapy: [facility] for tasks assessed/performed  GO    Velora Mediate, OTR/L  02/15/2013, 3:12 PM

## 2013-02-21 ENCOUNTER — Ambulatory Visit (INDEPENDENT_AMBULATORY_CARE_PROVIDER_SITE_OTHER): Payer: Medicare Other | Admitting: Otolaryngology

## 2013-02-22 ENCOUNTER — Ambulatory Visit (HOSPITAL_COMMUNITY): Payer: Medicare Other | Admitting: Specialist

## 2013-02-26 ENCOUNTER — Ambulatory Visit (HOSPITAL_COMMUNITY)
Admission: RE | Admit: 2013-02-26 | Discharge: 2013-02-26 | Disposition: A | Discharge status: Home / Self Care | Payer: Medicare Other | Source: Ambulatory Visit | Attending: Occupational Therapy | Admitting: Occupational Therapy

## 2013-02-27 ENCOUNTER — Ambulatory Visit (HOSPITAL_COMMUNITY)
Admission: RE | Admit: 2013-02-27 | Discharge: 2013-02-27 | Disposition: A | Discharge status: Home / Self Care | Payer: Medicare Other | Source: Ambulatory Visit | Attending: Occupational Therapy | Admitting: Occupational Therapy

## 2013-03-05 NOTE — Evaluation (Signed)
Occupational Therapy Re-Evaluation  Patient Details  Name: Tiffany Jacobs MRN: [id number] Date of Birth: Jul 24, 1945  Today's Date: 02/27/2013 Time: 1110-1150 OT Time Calculation (min): 40 min Reassessment 1110-1122 (12') TherExercises 1122-1150 (28')    Visit#: 14 of 18  Re-eval: 03/25/13  Assessment Diagnosis: Right Rotator Cuff Repair Surgical Date: 11/09/12  Authorization: [facility] Medicare   Authorization Time Period: Before 17th visit  Authorization Visit#: 14 of 17   Past Medical History:  Past Medical History  Diagnosis Date  . Hypertension   . Diabetes mellitus, type 2   . Sciatica   . Low back pain   . Obesity   . Seasonal allergies   . Asthma   . RAD (reactive airway disease)   . GERD (gastroesophageal reflux disease)   . Arthritis    Past Surgical History:  Past Surgical History  Procedure Laterality Date  . Total knee arthroplasty left  02-01-05    Dr. Romeo Apple  . Tubal ligation  1970  . Left breast biopsy for benign disease    . Shoulder open rotator cuff repair Right 11/09/2012    Procedure: ROTATOR CUFF REPAIR Right SHOULDER OPEN;  Surgeon: Vickki Hearing, MD;  Location: [facility];  Service: Orthopedics;  Laterality: Right;  . Shoulder acromioplasty Right 11/09/2012    Procedure: RIGHT SHOULDER ACROMIOPLASTY;  Surgeon: Vickki Hearing, MD;  Location: [facility];  Service: Orthopedics;  Laterality: Right;  . Acromio-clavicular joint repair Right 11/09/2012    Procedure: ACROMIO-CLAVICULAR JOINT REPAIR;  Surgeon: Vickki Hearing, MD;  Location: [facility];  Service: Orthopedics;  Laterality: Right;    Subjective Symptoms/Limitations Symptoms: S:  I am still a zero... I cannot believe it Pain Assessment Currently in Pain?: No/denies  Precautions/Restrictions  Precautions Precautions: Shoulder Restrictions Weight Bearing Restrictions: No  Balance Screening Balance Screen Has the patient fallen in the past 6 months: No  Prior Functioning  Home  Living Family/patient expects to be discharged to:: Private residence Prior Function Level of Independence: Independent with basic ADLs Comments: back to playing on computer with no difficulty   Assessment ADL/Vision/Perception ADL ADL Comments: Reports independent abilty to pull pants and turn bra independently  Dominant Hand: Right  Cognition/Observation Cognition Overall Cognitive Status: Within Functional Limits for tasks assessed Arousal/Alertness: Awake/alert   Additional Assessments RUE AROM (degrees) RUE Overall AROM Comments: measured in sitting Right Shoulder Flexion: 130 Degrees (82) Right Shoulder ABduction: 118 Degrees (78) Right Shoulder Internal Rotation: 70 Degrees (72) Right Shoulder External Rotation: 80 Degrees (65) RUE Strength Right Shoulder Flexion: 3/5 (2/5) Right Shoulder ABduction: 3/5 (2/5) Right Shoulder Internal Rotation: 3+/5 (3/5) Right Shoulder External Rotation: 3/5 (3-/5) Palpation Palpation: trace-min fascial restrictions throughout right upper arm, trapezius and scapular regions       Exercise/Treatments Supine Horizontal ABduction: AAROM;15 reps;Weights Horizontal ABduction Weight (lbs): 2 Flexion: AAROM;15 reps;Weights Shoulder Flexion Weight (lbs): 2 ABduction: AAROM;15 reps;Weights Shoulder ABduction Weight (lbs): 2 Seated Protraction: AROM;10 reps Horizontal ABduction: AROM;10 reps External Rotation: AROM;10 reps Internal Rotation: AROM;10 reps Flexion: AROM;10 reps Abduction: AROM;10 reps Standing External Rotation: Theraband;15 reps Theraband Level (Shoulder External Rotation): Level 2 (Red) Internal Rotation: Theraband;15 reps Theraband Level (Shoulder Internal Rotation): Level 2 (Red) Flexion: Theraband;15 reps Theraband Level (Shoulder Flexion): Level 2 (Red) Extension: Theraband;15 reps Theraband Level (Shoulder Extension): Level 2 (Red) Row: Theraband;15 reps Theraband Level (Shoulder Row): Level 2 (Red) ROM /  Strengthening / Isometric Strengthening UBE (Upper Arm Bike): 2 forward, 2' backward level 2.0 Cybex Press: 10 reps;1 plate Cybex  Row: 10 reps;1 plate Proximal Shoulder Strengthening, Supine: 2'   Occupational Therapy Assessment and Plan OT Assessment and Plan Clinical Impression Statement: A:  reassessment completed this date.  Patient has met 4/5 STG, progressing towards additional goal;  1/4 LTG met, progressing towards remaining.  Patient with improved strength and endurance for all activities this date.  Patient cont to report shoulder feels best with cybex exercises with no pain and good form.  Cont 1-2 x/week 4 weeks to maximize functional potential/independence OT Plan: P: P:  overhead reaching and sustained activity, hold arm off wall at end of ladder x 10" for at least 1', ball on wall   Goals Short Term Goals Short Term Goal 1: Patient will be educated on HEP Short Term Goal 1 Progress: Met Short Term Goal 2: Patient will improve PROM to [facility] RUE to increase independence with ADL tasks Short Term Goal 2 Progress: Met Short Term Goal 3: Patient will have 3+5 strenth proximal RUE to increase ability to complete daily tasks (ie. pulling pants) Short Term Goal 3 Progress: Met Short Term Goal 4: Patient will report decreased shoulder pain to 4/10 during daily activities Short Term Goal 4 Progress: Met Short Term Goal 5: Patient wiill decrease right shoulder fascial restrictions to min-mod to increase functional reach  Short Term Goal 5 Progress: Met Long Term Goals Long Term Goal 1: Patient will return to hightest level of independence with all daily and leisure activities.  Long Term Goal 1 Progress: Progressing toward goal Long Term Goal 2: Patiwtn will increase AROM to [facility] in order to complete functional overhead actiivities/reaching  Long Term Goal 2 Progress: Met Long Term Goal 3: Patient will increase right shoulder strength to 4/5 in order to complete IADL activities Long  Term Goal 3 Progress: Progressing toward goal Long Term Goal 4: Patient will decrease right shoulder fascial restrictions to trace in order to increase functional use.  Long Term Goal 4 Progress: Progressing toward goal  Problem List Patient Active Problem List   Diagnosis Date Noted  . Abscess of back 01/29/2013  . Personal history of noncompliance with medical treatment, presenting hazards to health 01/19/2013  . S/P rotator cuff repair 11/22/2012  . Insomnia 06/15/2012  . Rotator cuff tear 04/17/2012  . Hyperlipidemia LDL goal < 70 11/20/2010  . COUGH VARIANT ASTHMA 11/27/2009  . OBESITY 06/21/2009  . ESSENTIAL HYPERTENSION 03/08/2009  . FATIGUE 03/08/2009  . SHOULDER, ARTHRITIS, DEGEN./OSTEO 08/28/2007  . RUPTURE ROTATOR CUFF 08/28/2007  . LOW BACK PAIN 06/28/2007  . SCIATICA 06/28/2007  . DM (diabetes mellitus), type 2, uncontrolled 11/28/2006    End of Session Activity Tolerance: Patient tolerated treatment well General Behavior During Therapy: [facility] for tasks assessed/performed  GO    Velora Mediate, OTR/L  02/27/2013, 10:50 PM  Physician Documentation Your signature is required to indicate approval of the treatment plan as stated above.  Please sign and either send electronically or make a copy of this report for your files and return this physician signed original.  Please mark one 1.__approve of plan  2. ___approve of plan with the following conditions.   ______________________________                                                          _____________________ Physician Signature  Date  

## 2013-03-05 NOTE — Progress Notes (Addendum)
Occupational Therapy Treatment Patient Details  Name: Tiffany Jacobs MRN: [id number] Date of Birth: Jul 06, 1945  Today's Date: 02/26/2013 Time: 7829-5621 OT Time Calculation (min): 45 min Manual 1348-1400 (12') TherExercises 1400-1433 (33')  Visit#: 13 of 18  Re-eval: 02/25/13    Authorization: [employer] Medicare   Authorization Time Period: Before 17th visit  Authorization Visit#: 13 of 17  Subjective Symptoms/Limitations Symptoms: S:  I am doing good... I am a zero  Pain Assessment Currently in Pain?: No/denies  Exercise/Treatments Supine Protraction: AAROM;15 reps;Weights Protraction Weight (lbs): 2 Horizontal ABduction: AAROM;15 reps;Weights Horizontal ABduction Weight (lbs): 2 External Rotation: AAROM;15 reps;Weights External Rotation Weight (lbs): 2 Internal Rotation: AAROM;15 reps;Weights Internal Rotation Weight (lbs): 2 Flexion: AAROM;15 reps;Weights Shoulder Flexion Weight (lbs): 2 ABduction: AAROM;15 reps;Weights Shoulder ABduction Weight (lbs): 2 Seated Protraction: AROM;10 reps Horizontal ABduction: AROM;10 reps External Rotation: AROM;10 reps Internal Rotation: AROM;10 reps Flexion: AROM;10 reps Abduction: AROM;10 reps Prone    Sidelying   Standing External Rotation: Theraband;12 reps Theraband Level (Shoulder External Rotation): Level 2 (Red) Internal Rotation: Theraband;12 reps Theraband Level (Shoulder Internal Rotation): Level 2 (Red) Flexion: Theraband;12 reps Theraband Level (Shoulder Flexion): Level 2 (Red) Extension: Theraband;12 reps Theraband Level (Shoulder Extension): Level 2 (Red) Row: Theraband;12 reps Theraband Level (Shoulder Row): Level 2 (Red) ROM / Strengthening / Isometric Strengthening UBE (Upper Arm Bike): 2 forward, 2' backward level 2.0 Cybex Press: 10 reps;1 plate Cybex Row: 10 reps;1 plate       Manual Therapy Manual Therapy: Myofascial release Myofascial Release: MFR and manual stretching to right upper arm,  scapular, trapezius region to decrease pain and fascial restrictions and increase pain free mobility  Occupational Therapy Assessment and Plan OT Assessment and Plan Clinical Impression Statement: A:  Patient with no complaint of pain this date upon arrival.  Patient reports increased functional use of RUE in daily tasks.  initiated cybex this date with good tolerance and report of shoulder feeling very well with row action this date.   increased seated AROM this date with report of increased fatigue.   OT Plan: P:  Reassessment; lower strap/serratus initiation off wall walk x 10" for atleast 1'   Goals Short Term Goals Short Term Goal 1: Patient will be educated on HEP Short Term Goal 1 Progress: Met Short Term Goal 2: Patient will improve PROM to [facility] RUE to increase independence with ADL tasks Short Term Goal 2 Progress: Met Short Term Goal 3: Patient will have 3+5 strenth proximal RUE to increase ability to complete daily tasks (ie. pulling pants) Short Term Goal 3 Progress: Partly met Short Term Goal 4: Patient will report decreased shoulder pain to 4/10 during daily activities Short Term Goal 4 Progress: Met Short Term Goal 5: Patient wiill decrease right shoulder fascial restrictions to min-mod to increase functional reach  Short Term Goal 5 Progress: Met Long Term Goals Long Term Goal 1: Patient will return to hightest level of independence with all daily and leisure activities.  Long Term Goal 1 Progress: Progressing toward goal Long Term Goal 2: Patiwtn will increase AROM to [facility] in order to complete functional overhead actiivities/reaching  Long Term Goal 2 Progress: Progressing toward goal Long Term Goal 3: Patient will increase right shoulder strength to 4/5 in order to complete IADL activities Long Term Goal 3 Progress: Progressing toward goal Long Term Goal 4: Patient will decrease right shoulder fascial restrictions to trace in order to increase functional use.  Long Term  Goal 4 Progress: Progressing toward goal  Problem  List Patient Active Problem List   Diagnosis Date Noted  . Abscess of back 01/29/2013  . Personal history of noncompliance with medical treatment, presenting hazards to health 01/19/2013  . S/P rotator cuff repair 11/22/2012  . Insomnia 06/15/2012  . Rotator cuff tear 04/17/2012  . Hyperlipidemia LDL goal < 70 11/20/2010  . COUGH VARIANT ASTHMA 11/27/2009  . OBESITY 06/21/2009  . ESSENTIAL HYPERTENSION 03/08/2009  . FATIGUE 03/08/2009  . SHOULDER, ARTHRITIS, DEGEN./OSTEO 08/28/2007  . RUPTURE ROTATOR CUFF 08/28/2007  . LOW BACK PAIN 06/28/2007  . SCIATICA 06/28/2007  . DM (diabetes mellitus), type 2, uncontrolled 11/28/2006    End of Session Activity Tolerance: Patient tolerated treatment well General Behavior During Therapy: [facility] for tasks assessed/performed  GO    Velora Mediate, OTR/L  02/26/2013, 9:45 PM

## 2013-03-06 ENCOUNTER — Ambulatory Visit (HOSPITAL_COMMUNITY): Payer: Medicare Other | Admitting: Specialist

## 2013-03-11 ENCOUNTER — Ambulatory Visit (HOSPITAL_COMMUNITY)
Admission: RE | Admit: 2013-03-11 | Discharge: 2013-03-11 | Disposition: A | Discharge status: Home / Self Care | Payer: Medicare HMO | Source: Ambulatory Visit | Attending: Orthopedic Surgery | Admitting: Orthopedic Surgery

## 2013-03-11 DIAGNOSIS — M25519 Pain in unspecified shoulder: Secondary | ICD-10-CM | POA: Insufficient documentation

## 2013-03-11 DIAGNOSIS — M25619 Stiffness of unspecified shoulder, not elsewhere classified: Secondary | ICD-10-CM | POA: Insufficient documentation

## 2013-03-11 DIAGNOSIS — IMO0001 Reserved for inherently not codable concepts without codable children: Secondary | ICD-10-CM | POA: Insufficient documentation

## 2013-03-11 DIAGNOSIS — I10 Essential (primary) hypertension: Secondary | ICD-10-CM | POA: Insufficient documentation

## 2013-03-11 DIAGNOSIS — E119 Type 2 diabetes mellitus without complications: Secondary | ICD-10-CM | POA: Insufficient documentation

## 2013-03-11 NOTE — Progress Notes (Signed)
Occupational Therapy Treatment Patient Details  Name: Tiffany Jacobs MRN: [id number] Date of Birth: 1946/02/02  Today's Date: 03/11/2013 Time: 0315-0400 OT Time Calculation (min): 45 min Manual 315-327 (12') Therapeutic Exercise 327-400 (33')  Visit#: 15 of 18  Re-eval: 03/25/13   Authorization: [employer]   Authorization Time Period: Before 24th visit  Authorization Visit#: 15 of 24  Subjective Symptoms/Limitations Symptoms: "I got those scar pads - I've had a time getting them to stay on, but I think they're working." Pain Assessment Currently in Pain?: No/denies Pain Score: 0-No pain  Exercise/Treatments  03/11/13 0700  Shoulder Exercises: Supine  Protraction AAROM;20 reps;Weights  Protraction Weight (lbs) 2  Horizontal ABduction AAROM;15 reps;Weights  Horizontal ABduction Weight (lbs) 2  External Rotation AAROM;20 reps;Weights  External Rotation Weight (lbs) 2  Internal Rotation AAROM;20 reps;Weights  Internal Rotation Weight (lbs) 2  Flexion AAROM;20 reps;Weights  Shoulder Flexion Weight (lbs) 2  ABduction AAROM;20 reps;Weights  Shoulder ABduction Weight (lbs) 2  Shoulder Exercises: Standing  External Rotation Theraband;15 reps  Theraband Level (Shoulder External Rotation) Level 2 (Red)  Internal Rotation Theraband;15 reps  Theraband Level (Shoulder Internal Rotation) Level 2 (Red)  Flexion Theraband;15 reps  Theraband Level (Shoulder Flexion) Level 2 (Red)  Extension Theraband;15 reps  Theraband Level (Shoulder Extension) Level 2 (Red)  Row Theraband;15 reps  Theraband Level (Shoulder Row) Level 2 (Red)  Shoulder Exercises: ROM/Strengthening  UBE (Upper Arm Bike) 2 forward, 2' backward level 2.5  Cybex Press 15 reps;1 plate  Cybex Row 15 reps;1 plate  Proximal Shoulder Strengthening, Supine 2' total - 1' each and 30sec each counter/clockwise (w 2 lb hand weights)  Manual Therapy  Manual Therapy Myofascial release  Myofascial Release MFR and manual  stretching to right upper arm, scapular, trapezius region to decrease pain and fascial restrictions and increase pain free mobility.    Occupational Therapy Assessment and Plan OT Assessment and Plan Clinical Impression Statement: A: Pt presents this date w cont report of 1/10 pain. Pt tolerated will addition of counter/clockwise circles for proximal shoulder stabilization.  Pt also tolerated increase in reps. Pt required rest breaks between exercises. OT Plan: P:  overhead reaching and sustained activity, hold arm off wall at end of ladder x 10" for at least 1', ball on wall   Goals Long Term Goals Long Term Goal 1: Patient will return to hightest level of independence with all daily and leisure activities.  Long Term Goal 1 Progress: Progressing toward goal Long Term Goal 2: Patient will increase AROM to [facility] in order to complete functional overhead actiivities/reaching  Long Term Goal 2 Progress: Met Long Term Goal 3: Patient will increase right shoulder strength to 4/5 in order to complete IADL activities Long Term Goal 3 Progress: Progressing toward goal Long Term Goal 4: Patient will decrease right shoulder fascial restrictions to trace in order to increase functional use.  Long Term Goal 4 Progress: Progressing toward goal  Problem List Patient Active Problem List   Diagnosis Date Noted  . Abscess of back 01/29/2013  . Personal history of noncompliance with medical treatment, presenting hazards to health 01/19/2013  . S/P rotator cuff repair 11/22/2012  . Insomnia 06/15/2012  . Rotator cuff tear 04/17/2012  . Hyperlipidemia LDL goal < 70 11/20/2010  . COUGH VARIANT ASTHMA 11/27/2009  . OBESITY 06/21/2009  . ESSENTIAL HYPERTENSION 03/08/2009  . FATIGUE 03/08/2009  . SHOULDER, ARTHRITIS, DEGEN./OSTEO 08/28/2007  . RUPTURE ROTATOR CUFF 08/28/2007  . LOW BACK PAIN 06/28/2007  . SCIATICA 06/28/2007  .  DM (diabetes mellitus), type 2, uncontrolled 11/28/2006    End of  Session Activity Tolerance: Patient tolerated treatment well General Behavior During Therapy: WFL for tasks assessed/performed  GO Functional Limitation: Carrying, moving and handling objects Carrying, Moving and Handling Objects Current Status (G8984): At least 20 percent but less than 40 percent impaired, limited or restricted Carrying, Moving and Handling Objects Goal Status (G8985): At least 1 percent but less than 20 percent impaired, limited or restricted  Marie Rawlings, MS, OTR/L [phone]  03/11/2013, 4:11 PM 

## 2013-03-13 ENCOUNTER — Ambulatory Visit (HOSPITAL_COMMUNITY): Payer: Medicare Other | Admitting: Specialist

## 2013-03-15 ENCOUNTER — Ambulatory Visit (HOSPITAL_COMMUNITY): Payer: Medicare Other | Admitting: Occupational Therapy

## 2013-03-20 ENCOUNTER — Encounter: Payer: Self-pay | Admitting: Family Medicine

## 2013-03-20 ENCOUNTER — Ambulatory Visit (INDEPENDENT_AMBULATORY_CARE_PROVIDER_SITE_OTHER): Payer: Medicare HMO | Admitting: Family Medicine

## 2013-03-20 VITALS — BP 130/86 | HR 97 | Temp 98.8°F | Resp 16 | Wt 216.1 lb

## 2013-03-20 DIAGNOSIS — E785 Hyperlipidemia, unspecified: Secondary | ICD-10-CM

## 2013-03-20 DIAGNOSIS — IMO0001 Reserved for inherently not codable concepts without codable children: Secondary | ICD-10-CM

## 2013-03-20 DIAGNOSIS — Z23 Encounter for immunization: Secondary | ICD-10-CM

## 2013-03-20 DIAGNOSIS — IMO0002 Reserved for concepts with insufficient information to code with codable children: Secondary | ICD-10-CM

## 2013-03-20 DIAGNOSIS — I10 Essential (primary) hypertension: Secondary | ICD-10-CM

## 2013-03-20 DIAGNOSIS — J45991 Cough variant asthma: Secondary | ICD-10-CM

## 2013-03-20 DIAGNOSIS — E1165 Type 2 diabetes mellitus with hyperglycemia: Secondary | ICD-10-CM

## 2013-03-20 DIAGNOSIS — J309 Allergic rhinitis, unspecified: Secondary | ICD-10-CM

## 2013-03-20 MED ORDER — FLUCONAZOLE 150 MG PO TABS: ORAL_TABLET | ORAL | Status: AC

## 2013-03-20 MED ORDER — MONTELUKAST SODIUM 10 MG PO TABS: 10.0000 mg | ORAL_TABLET | Freq: Every day | ORAL | Status: DC

## 2013-03-20 MED ORDER — METHYLPREDNISOLONE ACETATE 80 MG/ML IJ SUSP
80.0000 mg | Freq: Once | INTRAMUSCULAR | Status: AC
  Administered 2013-03-20: 80 mg via INTRAMUSCULAR

## 2013-03-20 NOTE — Patient Instructions (Signed)
F/u as before  Depo medrol 80mg  Im today for uncontrolled allergies  Singulair is sent in for daily use for allergies and asthma  Buy sudafed (OTC) take one daily for 2 to 3 days to reduce nasal drainage  Flush nose and sinuses 2 to 3 times daily with saline   Microalb from office today  Flu vaccine today  Fluconazole prescribed.  Pls get labs for next visit as ordered

## 2013-03-21 LAB — MICROALBUMIN / CREATININE URINE RATIO
Creatinine, Urine: 54.8 mg/dL
MICROALB UR: 3.22 mg/dL — AB (ref 0.00–1.89)
Microalb Creat Ratio: 58.8 mg/g — ABNORMAL HIGH (ref 0.0–30.0)

## 2013-03-21 NOTE — Progress Notes (Signed)
   Subjective:    Patient ID: Tiffany Jacobs, female    DOB: January 13, 1946, 68 y.o.   MRN: [id number]  HPI  2.5 week h/o increased nasal drainage and dry cough. Excessive sneezing and watery eyes, had low grade fever last week, now resolved. Blood sugars she reports are much improved, with fasting blood sugars generally under 150. Improved mobility and less pain in right shoulder following surgery last Fall Ready for flu vaccine and rescheduled recent eye exam due to poor health at the time  Review of Systems See HPI  Denies , ear pain or sore throat.does c/o sinus pressure and nasal congestion Denies chest congestion, productive cough or wheezing. Denies chest pains, palpitations and leg swelling Denies abdominal pain, nausea, vomiting,diarrhea or constipation.   Denies dysuria, frequency, hesitancy or incontinence. Chronic back and knee pain with mild  limitation in mobility. Denies headaches, seizures, numbness, or tingling. Denies depression, uncontrolled  anxiety or insomnia. Denies skin break down or rash.        Objective:   Physical Exam  Patient alert and oriented and in no cardiopulmonary distress.  HEENT: No facial asymmetry, EOMI, no sinus tenderness,  oropharynx pink and moist.  Neck supple no adenopathy.TM clear, nasal mucosa erythematous and edematous excessive watering of eyes  Chest: Clear to auscultation bilaterally.  CVS: S1, S2 no murmurs, no S3.  ABD: Soft non tender. Bowel sounds normal.  Ext: No edema  MS: Adequate ROM spine, shoulders, hips and knees.  Skin: Intact, no ulcerations or rash noted.  Psych: Good eye contact, normal affect. Memory intact not anxious or depressed appearing.  CNS: CN 2-12 intact, power, tone and sensation normal throughout.       Assessment & Plan:

## 2013-03-24 NOTE — Assessment & Plan Note (Signed)
Updated lab for next visit Patient advised to reduce carb and sweets, commit to regular physical activity, take meds as prescribed, test blood as directed, and attempt to lose weight, to improve blood sugar control.

## 2013-03-24 NOTE — Assessment & Plan Note (Signed)
Controlled, no change in medication DASH diet and commitment to daily physical activity for a minimum of 30 minutes discussed and encouraged, as a part of hypertension management. The importance of attaining a healthy weight is also discussed.  

## 2013-03-24 NOTE — Assessment & Plan Note (Signed)
Uncontrolled symptoms, depo medrolo and change in allergy meds with daily use, pt re educated

## 2013-03-24 NOTE — Assessment & Plan Note (Signed)
Currently uncontrolled symptoms , commitment to singulair needed, finances are a constraint constantly

## 2013-03-24 NOTE — Assessment & Plan Note (Signed)
Hyperlipidemia:Low fat diet discussed and encouraged.  Updated lab next visit, continue current med

## 2013-03-27 ENCOUNTER — Telehealth: Payer: Self-pay

## 2013-03-28 ENCOUNTER — Telehealth: Payer: Self-pay

## 2013-03-28 MED ORDER — BUDESONIDE-FORMOTEROL FUMARATE 160-4.5 MCG/ACT IN AERO: 2.0000 | INHALATION_SPRAY | Freq: Two times a day (BID) | RESPIRATORY_TRACT | Status: DC

## 2013-03-28 NOTE — Telephone Encounter (Signed)
Opened in error

## 2013-03-28 NOTE — Telephone Encounter (Signed)
Patient would like to know if there is anything else that can be prescribed for asthma.  She states that she is still having episodes of SOB.

## 2013-03-28 NOTE — Telephone Encounter (Signed)
symbicort will improve breathing, needs to use daily I will send in today pls let her know to check [facility] on [facility]

## 2013-03-29 ENCOUNTER — Telehealth: Payer: Self-pay

## 2013-03-29 NOTE — Telephone Encounter (Signed)
Called and left message for patient to return call.  

## 2013-03-29 NOTE — Telephone Encounter (Signed)
Patient aware.  And will collect rx.

## 2013-04-01 NOTE — Telephone Encounter (Signed)
Opened in error

## 2013-04-02 ENCOUNTER — Encounter: Payer: Self-pay | Admitting: Orthopedic Surgery

## 2013-04-02 ENCOUNTER — Ambulatory Visit (INDEPENDENT_AMBULATORY_CARE_PROVIDER_SITE_OTHER): Payer: Medicare HMO | Admitting: Orthopedic Surgery

## 2013-04-02 VITALS — BP 137/84 | Ht 62.0 in | Wt 216.0 lb

## 2013-04-02 DIAGNOSIS — Z9889 Other specified postprocedural states: Secondary | ICD-10-CM

## 2013-04-02 MED ORDER — HYDROCODONE-ACETAMINOPHEN 7.5-325 MG PO TABS: 1.0000 | ORAL_TABLET | ORAL | Status: DC | PRN

## 2013-04-02 NOTE — Progress Notes (Signed)
Patient ID: Tiffany Jacobs, female   DOB: 06/16/1945, 68 y.o.   MRN: [id number] Chief Complaint  Patient presents with  . Follow-up    2 month recheck right Rotator Cuff Repair DOS 11/09/12   BP 137/84  Ht 5\' 2"  (1.575 m)  Wt 216 lb (97.977 kg)  BMI 39.50 kg/m2 Encounter Diagnosis  Name Primary?  . S/P rotator cuff repair Yes    The patient is now 4-1/2 months status post open rotator cuff repair for a massive cuff tear. She has good forward elevation up to about 100 sitting in 150 lying down. She should continue strengthening exercises normal physical therapy and cane exercises should be added at home  Return in 2 months with a 6 month checkup  Meds ordered this encounter  Medications  . HYDROcodone-acetaminophen (NORCO) 7.5-325 MG per tablet    Sig: Take 1 tablet by mouth every 4 (four) hours as needed.    Dispense:  90 tablet    Refill:  0

## 2013-04-02 NOTE — Patient Instructions (Signed)
Continue home exercises.

## 2013-04-03 ENCOUNTER — Ambulatory Visit (HOSPITAL_COMMUNITY)
Admission: RE | Admit: 2013-04-03 | Discharge: 2013-04-03 | Disposition: A | Discharge status: Home / Self Care | Payer: Medicare HMO | Source: Ambulatory Visit | Attending: Family Medicine | Admitting: Family Medicine

## 2013-04-03 NOTE — Evaluation (Signed)
Occupational Therapy Evaluation  Patient Details  Name: Tiffany Jacobs MRN: [id number] Date of Birth: [birth year]  Today's Date: 04/03/2013 Time: 3545-6256 OT Time Calculation (min): 36 min Reassessment 1306-1320 (14') TherExercises 1320-1342 (15')  Visit#: 16 of 18  Re-eval: 05/15/13  Assessment Diagnosis: Right Rotator Cuff Repair Surgical Date: 11/09/12 Next MD Visit: 06/04/2013  Authorization: [facility] Medicare   Authorization Time Period: Before 24th visit  Authorization Visit#: 25 of 24   Past Medical History:  Past Medical History  Diagnosis Date  . Hypertension   . Diabetes mellitus, type 2   . Sciatica   . Low back pain   . Obesity   . Seasonal allergies   . Asthma   . RAD (reactive airway disease)   . GERD (gastroesophageal reflux disease)   . Arthritis    Past Surgical History:  Past Surgical History  Procedure Laterality Date  . Total knee arthroplasty left  02-01-05    Dr. Aline Brochure  . Tubal ligation  1970  . Left breast biopsy for benign disease    . Shoulder open rotator cuff repair Right 11/09/2012    Procedure: ROTATOR CUFF REPAIR Right SHOULDER OPEN;  Surgeon: Carole Civil, MD;  Location: [facility];  Service: Orthopedics;  Laterality: Right;  . Shoulder acromioplasty Right 11/09/2012    Procedure: RIGHT SHOULDER ACROMIOPLASTY;  Surgeon: Carole Civil, MD;  Location: [facility];  Service: Orthopedics;  Laterality: Right;  . Acromio-clavicular joint repair Right 11/09/2012    Procedure: ACROMIO-CLAVICULAR JOINT REPAIR;  Surgeon: Carole Civil, MD;  Location: [facility];  Service: Orthopedics;  Laterality: Right;    Subjective Symptoms/Limitations Symptoms: S:  I  dont really have any pain anymore except when I exercises sometimes it is a work pain  Pain Assessment Currently in Pain?: No/denies  Precautions/Restrictions  Restrictions Weight Bearing Restrictions: No  Balance Screening Balance Screen Has the patient fallen in the past 6 months:  No  Prior [place] expects to be discharged to:: Private residence Prior Function Comments: patient states she is playing on computer and back to crochetting with min complaint of fatigue quickly with tasks.   Assessment ADL/Vision/Perception ADL ADL Comments: Reports independent ability with bra and pants now with no complaint of pain  Dominant Hand: Right  Cognition/Observation Cognition Overall Cognitive Status: Within Functional Limits for tasks assessed   Additional Assessments RUE AROM (degrees) RUE Overall AROM Comments: assessed in standing Right Shoulder Flexion: 148 Degrees (130) Right Shoulder ABduction: 147 Degrees (118) Right Shoulder Internal Rotation: 72 Degrees (70) Right Shoulder External Rotation: 78 Degrees (80) RUE Strength Right Shoulder Flexion: 3+/5 (3/5) Right Shoulder ABduction: 3+/5 (3/5) Right Shoulder Internal Rotation: 3+/5 (3+/5) Right Shoulder External Rotation: 3/5 (3/5) Palpation Palpation: trace fascial restrictions throughout right upper arm, trapezius and scapular regions       Exercise/Treatments Seated Elevation: AROM;15 reps Row: AROM;12 reps;Weights Row Weight (lbs): 2# dowel rod  Protraction: AROM;12 reps;Weights Protraction Weight (lbs): 2# dowel rod  Horizontal ABduction: AROM;12 reps;Weights Horizontal ABduction Weight (lbs): 2# dowel rod  External Rotation: AROM;12 reps;Weights External Rotation Weight (lbs): 2# dowel rod  Internal Rotation: AROM;12 reps;Weights Internal Rotation Weight (lbs): 2# dowel rod  Flexion: AROM;12 reps;Weights Flexion Weight (lbs): 2# dowel rod  Abduction: AROM;12 reps;Weights ABduction Weight (lbs): 2# dowel rod  Other Seated Exercises: elbow flexion, extension, supination, pronation, wrist flexion and extension 10 times  Other Seated Exercises: D1, D2 patterns x 10 reps each non-weighted    ROM / Strengthening /  Isometric Strengthening Proximal Shoulder  Strengthening, Seated: 1' non weighted in vertical and horizontal planes (30"each)          Occupational Therapy Assessment and Plan OT Assessment and Plan Clinical Impression Statement: A:  Reassessment this date.  patient has missed several weeks of therapy secondary to illness.  She presents with increased range of motion and minimal increased strength.  She reports little to no pain with activities however cont with increased fatigue.  Recommend patient continue with therapy services 2x/week x 3 weeks to maximize functional potential/independence and finalize HEP.  toleated exercises well this datre.   OT Plan: P:  overhead reaching , sustained activity, theraband exercises, progress strengthening as tolerated.    Goals Short Term Goals Short Term Goal 1: Patient will be educated on HEP Short Term Goal 1 Progress: Met Short Term Goal 2: Patient will improve PROM to [facility] RUE to increase independence with ADL tasks Short Term Goal 2 Progress: Met Short Term Goal 3: Patient will have 3+5 strenth proximal RUE to increase ability to complete daily tasks (ie. pulling pants) Short Term Goal 3 Progress: Met Short Term Goal 4: Patient will report decreased shoulder pain to 4/10 during daily activities Short Term Goal 4 Progress: Met Short Term Goal 5: Patient wiill decrease right shoulder fascial restrictions to min-mod to increase functional reach  Short Term Goal 5 Progress: Met Long Term Goals Long Term Goal 1: Patient will return to hightest level of independence with all daily and leisure activities.  Long Term Goal 1 Progress: Progressing toward goal Long Term Goal 2: Patient will increase AROM to [facility] in order to complete functional overhead actiivities/reaching  Long Term Goal 2 Progress: Met Long Term Goal 3: Patient will increase right shoulder strength to 4/5 in order to complete IADL activities Long Term Goal 3 Progress: Progressing toward goal Long Term Goal 4: Patient will  decrease right shoulder fascial restrictions to trace in order to increase functional use.  Long Term Goal 4 Progress: Met  Problem List Patient Active Problem List   Diagnosis Date Noted  . Allergic rhinitis 03/20/2013  . Personal history of noncompliance with medical treatment, presenting hazards to health 01/19/2013  . S/P rotator cuff repair 11/22/2012  . Insomnia 06/15/2012  . Hyperlipidemia LDL goal < 70 11/20/2010  . COUGH VARIANT ASTHMA 11/27/2009  . OBESITY 06/21/2009  . ESSENTIAL HYPERTENSION 03/08/2009  . FATIGUE 03/08/2009  . SHOULDER, ARTHRITIS, DEGEN./OSTEO 08/28/2007  . RUPTURE ROTATOR CUFF 08/28/2007  . LOW BACK PAIN 06/28/2007  . SCIATICA 06/28/2007  . DM (diabetes mellitus), type 2, uncontrolled 11/28/2006    End of Session Activity Tolerance: Patient tolerated treatment well General Behavior During Therapy: [facility] for tasks assessed/performed  GO    Donney Rankins, OTR/L  04/03/2013, 4:30 PM  Physician Documentation Your signature is required to indicate approval of the treatment plan as stated above.  Please sign and either send electronically or make a copy of this report for your files and return this physician signed original.  Please mark one 1.__approve of plan  2. ___approve of plan with the following conditions.   ______________________________                                                          _____________________ Physician Signature  Date  

## 2013-04-04 ENCOUNTER — Ambulatory Visit (HOSPITAL_COMMUNITY)
Admission: RE | Admit: 2013-04-04 | Discharge: 2013-04-04 | Disposition: A | Discharge status: Home / Self Care | Payer: Medicare HMO | Source: Ambulatory Visit | Attending: Family Medicine | Admitting: Family Medicine

## 2013-04-04 NOTE — Progress Notes (Signed)
Occupational Therapy Treatment Patient Details  Name: MARDA BREIDENBACH MRN: [id number] Date of Birth: October 25, 1945  Today's Date: 04/04/2013 Time: 9937-1696 OT Time Calculation (min): 53 min Therapeutic exercises 7893-8101 43' Heat 7510-2585 10' Visit#: 17 of 22  Re-eval: 04/17/13    Authorization: [facility] Medicare   Authorization Time Period: Before 24th visit  Authorization Visit#: 17 of 24  Subjective S:  I dont have pain today.  It feels pretty good. Pain Assessment Currently in Pain?: No/denies Pain Score: 0-No pain  Exercise/Treatments Supine Protraction: AAROM;15 reps Protraction Weight (lbs): 2 Horizontal ABduction: AAROM;15 reps;Weights Horizontal ABduction Weight (lbs): 2 External Rotation: AAROM;15 reps;Weights External Rotation Weight (lbs): 2 Internal Rotation: AAROM;15 reps;Weights Internal Rotation Weight (lbs): 2 Flexion: AAROM;15 reps;Weights Shoulder Flexion Weight (lbs): 2 ABduction: AAROM;15 reps;Weights;Limitations Shoulder ABduction Weight (lbs): 2 ABduction Limitations: to 90 degrees of range Standing Protraction: AAROM;10 reps;Weights Protraction Weight (lbs): 2 Horizontal ABduction: AAROM;10 reps;Weights Horizontal ABduction Weight (lbs): 2 External Rotation: AAROM;10 reps;Weights External Rotation Weight (lbs): 2 Internal Rotation: AAROM;10 reps;Weights Internal Rotation Weight (lbs): 2 Other Standing Exercises: lifting1# weight into overhead cabinet to simulate placing groceries into cabinet.6 repetitions ROM / Strengthening / Isometric Strengthening UBE (Upper Arm Bike): 3' forward and 3' reverse 2.5  Wall Wash: 30 seconds with 1# on wrist "W" Arms: 10 times X to V Arms: 10 times Proximal Shoulder Strengthening, Supine: 10 times each with out resting Other ROM/Strengthening Exercises: seated overhead press from 90 to 170 range 10 times Other ROM/Strengthening Exercises: AROM seated abduction and flexion through full range to strengthen  suprapinatus 10 time with min faciliation from 70-110     Modalities Modalities: Moist Heat Manual Therapy Myofascial Release: DC manual therapy to PRN Moist Heat Therapy Number Minutes Moist Heat: 10 Minutes Moist Heat Location: Shoulder Weight Bearing Technique Weight Bearing Technique: No  Occupational Therapy Assessment and Plan OT Assessment and Plan Clinical Impression Statement: A:  focused on strengthening of shoulder, particularly of supraspinatus in order to increase independence with 70-120 range of flexion and abduction OT Plan: P:  Attempt AROM in standing, in addition to AAROM with weighted dowel.  Attempt overhead lace to increase independence with overhead reaching during functional activities.    Goals Short Term Goals Short Term Goal 1: Patient will be educated on HEP Short Term Goal 2: Patient will improve PROM to [facility] RUE to increase independence with ADL tasks Short Term Goal 3: Patient will have 3+5 strenth proximal RUE to increase ability to complete daily tasks (ie. pulling pants) Short Term Goal 4: Patient will report decreased shoulder pain to 4/10 during daily activities Short Term Goal 5: Patient wiill decrease right shoulder fascial restrictions to min-mod to increase functional reach  Long Term Goals Long Term Goal 1: Patient will return to hightest level of independence with all daily and leisure activities.  Long Term Goal 2: Patient will increase AROM to [facility] in order to complete functional overhead actiivities/reaching  Long Term Goal 3: Patient will increase right shoulder strength to 4/5 in order to complete IADL activities Long Term Goal 4: Patient will decrease right shoulder fascial restrictions to trace in order to increase functional use.   Problem List Patient Active Problem List   Diagnosis Date Noted  . Allergic rhinitis 03/20/2013  . Personal history of noncompliance with medical treatment, presenting hazards to health 01/19/2013  . S/P  rotator cuff repair 11/22/2012  . Insomnia 06/15/2012  . Hyperlipidemia LDL goal < 70 11/20/2010  . COUGH VARIANT ASTHMA 11/27/2009  .  OBESITY 06/21/2009  . ESSENTIAL HYPERTENSION 03/08/2009  . FATIGUE 03/08/2009  . SHOULDER, ARTHRITIS, DEGEN./OSTEO 08/28/2007  . RUPTURE ROTATOR CUFF 08/28/2007  . LOW BACK PAIN 06/28/2007  . SCIATICA 06/28/2007  . DM (diabetes mellitus), type 2, uncontrolled 11/28/2006    End of Session Activity Tolerance: Patient tolerated treatment well General Behavior During Therapy: [facility] for tasks assessed/performed  [place], OTR/L  04/04/2013, 11:57 AM

## 2013-04-08 ENCOUNTER — Ambulatory Visit (HOSPITAL_COMMUNITY)
Admission: RE | Admit: 2013-04-08 | Discharge: 2013-04-08 | Disposition: A | Discharge status: Home / Self Care | Payer: Medicare HMO | Source: Ambulatory Visit | Attending: Orthopedic Surgery | Admitting: Orthopedic Surgery

## 2013-04-08 DIAGNOSIS — M25519 Pain in unspecified shoulder: Secondary | ICD-10-CM | POA: Insufficient documentation

## 2013-04-08 DIAGNOSIS — I10 Essential (primary) hypertension: Secondary | ICD-10-CM | POA: Insufficient documentation

## 2013-04-08 DIAGNOSIS — E119 Type 2 diabetes mellitus without complications: Secondary | ICD-10-CM | POA: Insufficient documentation

## 2013-04-08 DIAGNOSIS — IMO0001 Reserved for inherently not codable concepts without codable children: Secondary | ICD-10-CM | POA: Insufficient documentation

## 2013-04-08 DIAGNOSIS — M25619 Stiffness of unspecified shoulder, not elsewhere classified: Secondary | ICD-10-CM | POA: Insufficient documentation

## 2013-04-08 NOTE — Progress Notes (Signed)
Occupational Therapy Treatment Patient Details  Name: Tiffany Jacobs MRN: [id number] Date of Birth: 1945-12-28  Today's Date: 04/08/2013 Time: 6967-8938 OT Time Calculation (min): 45 min Therapeutic exercise 45' Visit#: 18 of 22  Re-eval: 04/17/13    Authorization: [facility] Medicare   Authorization Time Period: Before 24th visit  Authorization Visit#: 18 of 24  Subjective S: It feels great.  0 pain. Pain Assessment Currently in Pain?: No/denies Pain Score: 0-No pain  Precautions/Restrictions   progress as tolerated  Exercise/Treatments Seated Other Seated Exercises: clothes pin tree to work on correct overhead reaching pattern vs leaning to left Other Seated Exercises: rolled and gripped pink tputty Standing Protraction: AROM;10 reps Horizontal ABduction: AROM;10 reps External Rotation: AROM;10 reps Internal Rotation: AROM;10 reps Flexion: AROM;10 reps ABduction: AROM;10 reps Other Standing Exercises: lifting1# weight into overhead cabinet to simulate placing groceries into cabinet.10  repetitions ROM / Strengthening / Isometric Strengthening UBE (Upper Arm Bike): 3' forward and 3' reverse 2.5  Wall Wash: 1 minute with 1# Over Head Lace: 1 minute "W" Arms: 12 times standing against wall X to V Arms: 12 times standing against wall Ball on Wall: 1 minute flexed to 90, 15 seconds with shoulder adducted to 90, goal was 1 minute Other ROM/Strengthening Exercises: seated overhead press from 90 to 170 range 10 times         Occupational Therapy Assessment and Plan OT Assessment and Plan Clinical Impression Statement: A:  advanced to AROM in standing with good form.  Treatment focus on shoulder girdle strengthening and proximal shoudler stability this date. OT Plan: P:  Complete abduction ball on wall activity for 1 min, only able to sustain 15" this date.  Increase resistance with UBE.   Goals Short Term Goals Short Term Goal 1: Patient will be educated on HEP Short  Term Goal 2: Patient will improve PROM to [facility] RUE to increase independence with ADL tasks Short Term Goal 3: Patient will have 3+5 strenth proximal RUE to increase ability to complete daily tasks (ie. pulling pants) Short Term Goal 4: Patient will report decreased shoulder pain to 4/10 during daily activities Short Term Goal 5: Patient wiill decrease right shoulder fascial restrictions to min-mod to increase functional reach  Long Term Goals Long Term Goal 1: Patient will return to hightest level of independence with all daily and leisure activities.  Long Term Goal 2: Patient will increase AROM to [facility] in order to complete functional overhead actiivities/reaching  Long Term Goal 3: Patient will increase right shoulder strength to 4/5 in order to complete IADL activities Long Term Goal 4: Patient will decrease right shoulder fascial restrictions to trace in order to increase functional use.   Problem List Patient Active Problem List   Diagnosis Date Noted  . Allergic rhinitis 03/20/2013  . Personal history of noncompliance with medical treatment, presenting hazards to health 01/19/2013  . S/P rotator cuff repair 11/22/2012  . Insomnia 06/15/2012  . Hyperlipidemia LDL goal < 70 11/20/2010  . COUGH VARIANT ASTHMA 11/27/2009  . OBESITY 06/21/2009  . ESSENTIAL HYPERTENSION 03/08/2009  . FATIGUE 03/08/2009  . SHOULDER, ARTHRITIS, DEGEN./OSTEO 08/28/2007  . RUPTURE ROTATOR CUFF 08/28/2007  . LOW BACK PAIN 06/28/2007  . SCIATICA 06/28/2007  . DM (diabetes mellitus), type 2, uncontrolled 11/28/2006    End of Session Activity Tolerance: Patient tolerated treatment well General Behavior During Therapy: [facility] for tasks assessed/performed  [place], OTR/L  04/08/2013, 3:22 PM

## 2013-04-10 ENCOUNTER — Inpatient Hospital Stay (HOSPITAL_COMMUNITY): Admission: RE | Admit: 2013-04-10 | Payer: Medicare HMO | Source: Ambulatory Visit | Admitting: Occupational Therapy

## 2013-04-11 ENCOUNTER — Other Ambulatory Visit: Payer: Self-pay | Admitting: Family Medicine

## 2013-04-15 ENCOUNTER — Ambulatory Visit (HOSPITAL_COMMUNITY)
Admission: RE | Admit: 2013-04-15 | Discharge: 2013-04-15 | Disposition: A | Discharge status: Home / Self Care | Payer: Medicare HMO | Source: Ambulatory Visit | Attending: Family Medicine | Admitting: Family Medicine

## 2013-04-15 NOTE — Progress Notes (Signed)
Occupational Therapy Treatment Patient Details  Name: Tiffany Jacobs MRN: [id number] Date of Birth: [birth year]  Today's Date: 04/15/2013 Time: 4098-1191 OT Time Calculation (min): 47 min  Therapeutic Exercises 66'  Visit#: 19 of 22  Re-eval: 04/17/13    Authorization: [employer] Medicare   Authorization Time Period: Before 24th visit  Authorization Visit#: 19 of 24  Subjective Symptoms/Limitations Symptoms: S: I bought myselft some hand weights - those are really working well." Pain Assessment Currently in Pain?: No/denies Pain Score: 0-No pain  Exercise/Treatments  Standing Protraction: AROM;10 reps Protraction Weight (lbs): 1 Horizontal ABduction: AROM;10 reps External Rotation: AROM;10 reps External Rotation Weight (lbs): 1 Internal Rotation: AROM;10 reps Internal Rotation Weight (lbs): 1 Flexion: AROM;10 reps (Pt had difficulty pushing through 90-100 degree range) ABduction: AROM;10 reps (Pt had difficulty pushing through 90-100 degree range) Other Standing Exercises: lifting1# weight into overhead cabinet to simulate placing groceries into cabinet.10 repetitions of taking out putting in 2-weight set   ROM / Strengthening / Isometric Strengthening UBE (Upper Arm Bike): 3' forward and 3' reverse 2.5  Cybex Press: 2 plate;10 reps Cybex Row: 2 plate;10 reps Wall Wash: 1 minute with 1# Over Head Lace: 1:30 "W" Arms: 12 times standing against wall X to V Arms: 12 times standing against wall Ball on Wall: 1 minute flexed to 90. Attempted with shoulder abducted to 90 - pt had increased pain (7/10) and stopped at 20 seconds Other ROM/Strengthening Exercises: seated overhead press from 90 to 170 range 10 times w 2 lb bar   Occupational Therapy Assessment and Plan OT Assessment and Plan Clinical Impression Statement: A: Continued AROM in standing. Pt had difficulty in mid range of flexion and abduction. Pt indicated she has bbought and begun using 2 lb weights at home  (including previous evening and this morning), so she may be fatigued this date from 2 lb exercises at home. OT Plan: P: Re-Evaluation. Re-attempt ball on wall in abduction.   Goals Short Term Goals Short Term Goal 1: Patient will be educated on HEP Short Term Goal 1 Progress: Met Short Term Goal 2: Patient will improve PROM to [facility] RUE to increase independence with ADL tasks Short Term Goal 2 Progress: Met Short Term Goal 3: Patient will have 3+5 strenth proximal RUE to increase ability to complete daily tasks (ie. pulling pants) Short Term Goal 3 Progress: Met Short Term Goal 4: Patient will report decreased shoulder pain to 4/10 during daily activities Short Term Goal 4 Progress: Met Short Term Goal 5: Patient wiill decrease right shoulder fascial restrictions to min-mod to increase functional reach  Short Term Goal 5 Progress: Met Long Term Goals Long Term Goal 1: Patient will return to hightest level of independence with all daily and leisure activities.  Long Term Goal 1 Progress: Progressing toward goal Long Term Goal 2: Patient will increase AROM to [facility] in order to complete functional overhead actiivities/reaching  Long Term Goal 2 Progress: Met Long Term Goal 3: Patient will increase right shoulder strength to 4/5 in order to complete IADL activities Long Term Goal 3 Progress: Progressing toward goal Long Term Goal 4: Patient will decrease right shoulder fascial restrictions to trace in order to increase functional use.  Long Term Goal 4 Progress: Met  Problem List Patient Active Problem List   Diagnosis Date Noted  . Allergic rhinitis 03/20/2013  . Personal history of noncompliance with medical treatment, presenting hazards to health 01/19/2013  . S/P rotator cuff repair 11/22/2012  . Insomnia 06/15/2012  .  Hyperlipidemia LDL goal < 70 11/20/2010  . COUGH VARIANT ASTHMA 11/27/2009  . OBESITY 06/21/2009  . ESSENTIAL HYPERTENSION 03/08/2009  . FATIGUE 03/08/2009  .  SHOULDER, ARTHRITIS, DEGEN./OSTEO 08/28/2007  . RUPTURE ROTATOR CUFF 08/28/2007  . LOW BACK PAIN 06/28/2007  . SCIATICA 06/28/2007  . DM (diabetes mellitus), type 2, uncontrolled 11/28/2006    End of Session Activity Tolerance: Patient tolerated treatment well General Behavior During Therapy: [facility] for tasks assessed/performed  GO    Bea Graff, MS, OTR/L [phone]  04/15/2013, 4:36 PM

## 2013-04-17 ENCOUNTER — Ambulatory Visit (HOSPITAL_COMMUNITY)
Admission: RE | Admit: 2013-04-17 | Discharge: 2013-04-17 | Disposition: A | Discharge status: Home / Self Care | Payer: Medicare HMO | Source: Ambulatory Visit | Attending: Family Medicine | Admitting: Family Medicine

## 2013-04-17 NOTE — Evaluation (Signed)
Occupational Therapy Discharge  Patient Details  Name: FEMALE IAFRATE MRN: [id number] Date of Birth: [birth year]  Today's Date: 04/17/2013 Time: 6948-5462 OT Time Calculation (min): 45 min ROM: 1437-1457 (20') Self-Care  1457-1512 (15') Therapeutic Exercises 1512-1522 (10')  Visit#: 20 of 22  Re-eval:    Assessment Diagnosis: Right Rotator Cuff Repair Surgical Date: 11/09/12 Next MD Visit: 06/04/2013 Prior Therapy: none   Authorization: [employer] Medicare   Authorization Time Period: Before 24th visit  Authorization Visit#: 34 of 24   Past Medical History:  Past Medical History  Diagnosis Date  . Hypertension   . Diabetes mellitus, type 2   . Sciatica   . Low back pain   . Obesity   . Seasonal allergies   . Asthma   . RAD (reactive airway disease)   . GERD (gastroesophageal reflux disease)   . Arthritis    Past Surgical History:  Past Surgical History  Procedure Laterality Date  . Total knee arthroplasty left  02-01-05    Dr. Aline Brochure  . Tubal ligation  1970  . Left breast biopsy for benign disease    . Shoulder open rotator cuff repair Right 11/09/2012    Procedure: ROTATOR CUFF REPAIR Right SHOULDER OPEN;  Surgeon: Carole Civil, MD;  Location: [facility];  Service: Orthopedics;  Laterality: Right;  . Shoulder acromioplasty Right 11/09/2012    Procedure: RIGHT SHOULDER ACROMIOPLASTY;  Surgeon: Carole Civil, MD;  Location: [facility];  Service: Orthopedics;  Laterality: Right;  . Acromio-clavicular joint repair Right 11/09/2012    Procedure: ACROMIO-CLAVICULAR JOINT REPAIR;  Surgeon: Carole Civil, MD;  Location: [facility];  Service: Orthopedics;  Laterality: Right;    Subjective Symptoms/Limitations Symptoms: S: "At first I couldn't reach around to my hip while i was taking my bath - but now I can! It feels so good to be able to do things again." Special Tests: FOTO 63/100 (Initial 35/100) Pain Assessment Currently in Pain?: No/denies Pain Score: 0-No  pain Pain Location: Shoulder  Precautions/Restrictions  Restrictions Weight Bearing Restrictions: No  Balance Screening Balance Screen Has the patient fallen in the past 6 months: No  Prior [place] expects to be discharged to:: Private residence Prior Function Level of Independence: Independent with basic ADLs Comments: patient states she is playing on computer and back to crochetting with min complaint of fatigue quickly with tasks.   Assessment ADL/Vision/Perception ADL ADL Comments: Reports independent ability with bra and pants now with no complaint of pain  Dominant Hand: Right  Cognition/Observation Cognition Overall Cognitive Status: Within Functional Limits for tasks assessed Arousal/Alertness: Awake/alert  Additional Assessments RUE AROM (degrees) (Previous 04/03/13) RUE Overall AROM Comments: assessed in standing Right Shoulder Flexion: 164 Degrees (148) Right Shoulder ABduction: 162 Degrees (147) Right Shoulder Internal Rotation: 75 Degrees (72) Right Shoulder External Rotation: 94 Degrees (78) RUE Strength Right Shoulder Flexion: 3+/5 ((3+/5)) Right Shoulder ABduction: 3+/5 ((3+/5)) Right Shoulder Internal Rotation: 5/5 ((3+/5)) Right Shoulder External Rotation: 4/5 ((3/5)) Palpation Palpation: no fascial restrictions noted in right upper arm and scapular regions. min rescticions in upper trap regions.    Exercise/Treatments Standing External Rotation: AROM;10 reps Theraband Level (Shoulder External Rotation): Level 2 (Red) Internal Rotation: AROM;10 reps Theraband Level (Shoulder Internal Rotation): Level 2 (Red) Flexion: AROM;10 reps Theraband Level (Shoulder Flexion): Level 2 (Red) Extension: AROM;10 reps Theraband Level (Shoulder Extension): Level 2 (Red)  Occupational Therapy Assessment and Plan OT Assessment and Plan Clinical Impression Statement: A: Pt has met all short term  goals and 2/4 long term goals. She  has partially met the other 2 long term goals.  Pt has progressed well in therapy and will be able to continue her HEP at home to further strengthen her RUE. OT Plan: P: Pt is discharged from outpatient OT   Goals Short Term Goals Short Term Goal 1: Patient will be educated on HEP Short Term Goal 1 Progress: Met Short Term Goal 2: Patient will improve PROM to [facility] RUE to increase independence with ADL tasks Short Term Goal 2 Progress: Met Short Term Goal 3: Patient will have 3+5 strenth proximal RUE to increase ability to complete daily tasks (ie. pulling pants) Short Term Goal 3 Progress: Met Short Term Goal 4: Patient will report decreased shoulder pain to 4/10 during daily activities Short Term Goal 4 Progress: Met Short Term Goal 5: Patient wiill decrease right shoulder fascial restrictions to min-mod to increase functional reach  Short Term Goal 5 Progress: Met Long Term Goals Long Term Goal 1: Patient will return to hightest level of independence with all daily and leisure activities.  Long Term Goal 1 Progress: Partly met Long Term Goal 2: Patient will increase AROM to [facility] in order to complete functional overhead actiivities/reaching  Long Term Goal 2 Progress: Met Long Term Goal 3: Patient will increase right shoulder strength to 4/5 in order to complete IADL activities Long Term Goal 3 Progress: Partly met Long Term Goal 4: Patient will decrease right shoulder fascial restrictions to trace in order to increase functional use.  Long Term Goal 4 Progress: Met  Problem List Patient Active Problem List   Diagnosis Date Noted  . Allergic rhinitis 03/20/2013  . Personal history of noncompliance with medical treatment, presenting hazards to health 01/19/2013  . S/P rotator cuff repair 11/22/2012  . Insomnia 06/15/2012  . Hyperlipidemia LDL goal < 70 11/20/2010  . COUGH VARIANT ASTHMA 11/27/2009  . OBESITY 06/21/2009  . ESSENTIAL HYPERTENSION 03/08/2009  . FATIGUE 03/08/2009  .  SHOULDER, ARTHRITIS, DEGEN./OSTEO 08/28/2007  . RUPTURE ROTATOR CUFF 08/28/2007  . LOW BACK PAIN 06/28/2007  . SCIATICA 06/28/2007  . DM (diabetes mellitus), type 2, uncontrolled 11/28/2006    End of Session Activity Tolerance: Patient tolerated treatment well General Behavior During Therapy: WFL for tasks assessed/performed OT Plan of Care OT Home Exercise Plan: Reviewed HEP -strengthening in supine and standing (theraband and pt's 2 lb wrist weights)  GO Functional Assessment Tool Used: Clinical Observation, FOTO Functional Limitation: Carrying, moving and handling objects Carrying, Moving and Handling Objects Current Status (Q0347): At least 20 percent but less than 40 percent impaired, limited or restricted (Pt only 22% impaired) Carrying, Moving and Handling Objects Discharge Status [phone]): At least 20 percent but less than 40 percent impaired, limited or restricted  Bea Graff, [place], OTR/L [phone]  04/17/2013, 4:44 PM  Physician Documentation Your signature is required to indicate approval of the treatment plan as stated above.  Please sign and either send electronically or make a copy of this report for your files and return this physician signed original.  Please mark one 1.__approve of plan  2. ___approve of plan with the following conditions.   ______________________________  _____________________ Physician Signature                                                                                                             Date

## 2013-04-18 ENCOUNTER — Ambulatory Visit (INDEPENDENT_AMBULATORY_CARE_PROVIDER_SITE_OTHER): Payer: Medicare HMO | Admitting: Otolaryngology

## 2013-04-18 ENCOUNTER — Telehealth: Payer: Self-pay | Admitting: Family Medicine

## 2013-04-18 DIAGNOSIS — T169XXA Foreign body in ear, unspecified ear, initial encounter: Secondary | ICD-10-CM

## 2013-04-18 DIAGNOSIS — J330 Polyp of nasal cavity: Secondary | ICD-10-CM

## 2013-04-18 NOTE — Telephone Encounter (Signed)
Called patient back and left answer

## 2013-04-19 NOTE — Telephone Encounter (Signed)
Patient states someone came to her house with a machine that she is to wear overnight on her finger for her oxygen testing. Wasn't at home so couldn't tell me who the company was. She upset because she wasn't told about this. Advised her to call back with the name of the company because I didn't see where anything like that was ordered for her. Have you ordered anything for her like this?

## 2013-04-19 NOTE — Telephone Encounter (Signed)
If not in record, no, i do not recall doing this. Advise her please I have not ordered based on last note this year , so if company returns ask that they leave. Will discuss the need for testing if indicated at next visit

## 2013-04-30 LAB — HM DIABETES EYE EXAM

## 2013-05-02 ENCOUNTER — Other Ambulatory Visit: Payer: Self-pay | Admitting: Family Medicine

## 2013-05-02 DIAGNOSIS — Z139 Encounter for screening, unspecified: Secondary | ICD-10-CM

## 2013-05-07 LAB — HEMOGLOBIN A1C
Hgb A1c MFr Bld: 8.3 % — ABNORMAL HIGH (ref <5.7)
Mean Plasma Glucose: 192 mg/dL — ABNORMAL HIGH (ref <117)

## 2013-05-08 LAB — LIPID PANEL
Cholesterol: 144 mg/dL (ref 0–200)
HDL: 62 mg/dL (ref >39)
LDL Cholesterol: 66 mg/dL (ref 0–99)
Total CHOL/HDL Ratio: 2.3 Ratio
Triglycerides: 82 mg/dL (ref <150)
VLDL: 16 mg/dL (ref 0–40)

## 2013-05-08 LAB — COMPLETE METABOLIC PANEL WITH GFR
ALT: 22 U/L (ref 0–35)
AST: 21 U/L (ref 0–37)
Albumin: 4 g/dL (ref 3.5–5.2)
Alkaline Phosphatase: 95 U/L (ref 39–117)
BUN: 11 mg/dL (ref 6–23)
CO2: 32 mEq/L (ref 19–32)
Calcium: 9.8 mg/dL (ref 8.4–10.5)
Chloride: 100 mEq/L (ref 96–112)
Creat: 0.76 mg/dL (ref 0.50–1.10)
GFR, Est African American: >89 mL/min
GFR, Est Non African American: 81 mL/min
Glucose, Bld: 158 mg/dL — ABNORMAL HIGH (ref 70–99)
Potassium: 4.9 mEq/L (ref 3.5–5.3)
Sodium: 139 mEq/L (ref 135–145)
Total Bilirubin: 0.4 mg/dL (ref 0.2–1.2)
Total Protein: 7.3 g/dL (ref 6.0–8.3)

## 2013-05-08 LAB — TSH: TSH: 3.039 u[IU]/mL (ref 0.350–4.500)

## 2013-05-08 LAB — VITAMIN D 25 HYDROXY (VIT D DEFICIENCY, FRACTURES): Vit D, 25-Hydroxy: 30 ng/mL (ref 30–89)

## 2013-05-24 ENCOUNTER — Ambulatory Visit: Payer: Medicare Other | Admitting: Family Medicine

## 2013-05-30 ENCOUNTER — Encounter: Payer: Self-pay | Admitting: Family Medicine

## 2013-05-30 ENCOUNTER — Ambulatory Visit (HOSPITAL_COMMUNITY)
Admission: RE | Admit: 2013-05-30 | Discharge: 2013-05-30 | Disposition: A | Discharge status: Home / Self Care | Payer: Medicare HMO | Source: Ambulatory Visit | Attending: Family Medicine | Admitting: Family Medicine

## 2013-05-30 ENCOUNTER — Ambulatory Visit (INDEPENDENT_AMBULATORY_CARE_PROVIDER_SITE_OTHER): Payer: Medicare HMO | Admitting: Family Medicine

## 2013-05-30 VITALS — BP 142/80 | HR 96 | Resp 18 | Ht 62.5 in | Wt 219.0 lb

## 2013-05-30 DIAGNOSIS — E669 Obesity, unspecified: Secondary | ICD-10-CM

## 2013-05-30 DIAGNOSIS — IMO0001 Reserved for inherently not codable concepts without codable children: Secondary | ICD-10-CM

## 2013-05-30 DIAGNOSIS — E785 Hyperlipidemia, unspecified: Secondary | ICD-10-CM

## 2013-05-30 DIAGNOSIS — I10 Essential (primary) hypertension: Secondary | ICD-10-CM

## 2013-05-30 DIAGNOSIS — IMO0002 Reserved for concepts with insufficient information to code with codable children: Secondary | ICD-10-CM

## 2013-05-30 DIAGNOSIS — J45991 Cough variant asthma: Secondary | ICD-10-CM

## 2013-05-30 DIAGNOSIS — Z139 Encounter for screening, unspecified: Secondary | ICD-10-CM

## 2013-05-30 DIAGNOSIS — E1165 Type 2 diabetes mellitus with hyperglycemia: Secondary | ICD-10-CM

## 2013-05-30 DIAGNOSIS — Z1231 Encounter for screening mammogram for malignant neoplasm of breast: Secondary | ICD-10-CM | POA: Insufficient documentation

## 2013-05-30 DIAGNOSIS — G4700 Insomnia, unspecified: Secondary | ICD-10-CM

## 2013-05-30 DIAGNOSIS — J309 Allergic rhinitis, unspecified: Secondary | ICD-10-CM

## 2013-05-30 MED ORDER — DAPAGLIFLOZIN PROPANEDIOL 10 MG PO TABS: 1.0000 | ORAL_TABLET | Freq: Every day | ORAL | Status: DC

## 2013-05-30 NOTE — Progress Notes (Signed)
   Subjective:    Patient ID: Tiffany Jacobs, female    DOB: Jun 02, 1945, 68 y.o.   MRN: [id number]  HPI The PT is here for follow up and re-evaluation of chronic medical conditions, medication management and review of any available recent lab and radiology data.  Preventive health is updated, specifically  Cancer screening and Immunization.   Questions or concerns regarding consultations or procedures which the PT has had in the interim are  Addressed.Has upcoming appt with ortho which she hopes is the last, has had excellent result from recent shoulder surgery (right) The PT denies any adverse reactions to current medications since the last visit.  There are no new concerns. Is aware that blood sugar control is not as good as in the past , due to dietary indiscretion and less activity, will work non both Increased allergy symptoms as is common during early Spring    Review of Systems See HPI Denies recent fever or chills. Denies sinus pressure,  C/o increased nasal congestion, ear pain or sore throat. Denies chest congestion, productive cough or wheezing. Denies chest pains, palpitations and leg swelling Denies abdominal pain, nausea, vomiting,diarrhea or constipation.   Denies dysuria, frequency, hesitancy or incontinence. Denies joint pain, swelling and limitation in mobility. Denies headaches, seizures, numbness, or tingling. Denies depression, anxiety or insomnia. Denies skin break down or rash.        Objective:   Physical Exam  BP 142/80  Pulse 96  Resp 18  Ht 5' 2.5" (1.588 m)  Wt 219 lb (99.338 kg)  BMI 39.39 kg/m2  SpO2 94% Patient alert and oriented and in no cardiopulmonary distress.  HEENT: No facial asymmetry, EOMI, no sinus tenderness,  oropharynx pink and moist.  Neck supple no adenopathy.  Chest: Clear to auscultation bilaterally.  CVS: S1, S2 no murmurs, no S3.  ABD: Soft non tender. Bowel sounds normal.  Ext: No edema  MS: Adequate ROM spine,  shoulders, hips and knees.  Skin: Intact, no ulcerations or rash noted.  Psych: Good eye contact, normal affect. Memory intact not anxious or depressed appearing.  CNS: CN 2-12 intact, power, tone and sensation normal throughout.       Assessment & Plan:  ESSENTIAL HYPERTENSION Controlled, no change in medication DASH diet and commitment to daily physical activity for a minimum of 30 minutes discussed and encouraged, as a part of hypertension management. The importance of attaining a healthy weight is also discussed.   DM (diabetes mellitus), type 2, uncontrolled Deteriorated. Increase dose of farxiga Reduce carb intake and commit to regular exercise and weight loss  Hyperlipidemia LDL goal < 70 Controlled, no change in medication Hyperlipidemia:Low fat diet discussed and encouraged.    OBESITY Deteriorated. Patient re-educated about  the importance of commitment to a  minimum of 150 minutes of exercise per week. The importance of healthy food choices with portion control discussed. Encouraged to start a food diary, count calories and to consider  joining a support group. Sample diet sheets offered. Goals set by the patient for the next several months.     COUGH VARIANT ASTHMA Controlled, no change in medication   Allergic rhinitis Increased  Symptoms , daily use of medication necessary  Insomnia Sleep hygiene reviewed and written information offered also. Prescription sent for  medication needed.

## 2013-05-30 NOTE — Patient Instructions (Addendum)
F/u in 3 month, call if you need me before  Cholesterol, liver and kidney function are excellent  Blood sugar is too high, increase farxiga to 60m one daily, no other changes in your medication  You have lost 12 pounds in the past year which is excellent, and right shoulder much improved  HBA1C , chem 7 and EGFR in 3 month, before visit

## 2013-06-02 NOTE — Assessment & Plan Note (Signed)
Deteriorated. Patient re-educated about  the importance of commitment to a  minimum of 150 minutes of exercise per week. The importance of healthy food choices with portion control discussed. Encouraged to start a food diary, count calories and to consider  joining a support group. Sample diet sheets offered. Goals set by the patient for the next several months.    

## 2013-06-02 NOTE — Assessment & Plan Note (Signed)
Increased  Symptoms , daily use of medication necessary

## 2013-06-02 NOTE — Assessment & Plan Note (Signed)
Controlled, no change in medication DASH diet and commitment to daily physical activity for a minimum of 30 minutes discussed and encouraged, as a part of hypertension management. The importance of attaining a healthy weight is also discussed.  

## 2013-06-02 NOTE — Assessment & Plan Note (Signed)
Controlled, no change in medication Hyperlipidemia:Low fat diet discussed and encouraged.  \ 

## 2013-06-02 NOTE — Assessment & Plan Note (Signed)
Controlled, no change in medication  

## 2013-06-02 NOTE — Assessment & Plan Note (Signed)
Sleep hygiene reviewed and written information offered also. Prescription sent for  medication needed.  

## 2013-06-02 NOTE — Assessment & Plan Note (Signed)
Deteriorated. Increase dose of farxiga Reduce carb intake and commit to regular exercise and weight loss

## 2013-06-04 ENCOUNTER — Ambulatory Visit (INDEPENDENT_AMBULATORY_CARE_PROVIDER_SITE_OTHER): Payer: Medicare HMO | Admitting: Orthopedic Surgery

## 2013-06-04 VITALS — BP 138/82 | Ht 62.5 in | Wt 219.0 lb

## 2013-06-04 DIAGNOSIS — Z9889 Other specified postprocedural states: Secondary | ICD-10-CM

## 2013-06-04 DIAGNOSIS — M75101 Unspecified rotator cuff tear or rupture of right shoulder, not specified as traumatic: Secondary | ICD-10-CM

## 2013-06-04 DIAGNOSIS — S43429A Sprain of unspecified rotator cuff capsule, initial encounter: Secondary | ICD-10-CM

## 2013-06-04 MED ORDER — HYDROCODONE-ACETAMINOPHEN 7.5-325 MG PO TABS: 1.0000 | ORAL_TABLET | ORAL | Status: DC | PRN

## 2013-06-04 NOTE — Progress Notes (Signed)
Patient ID: Tiffany Jacobs, female   DOB: 01/23/1946, 68 y.o.   MRN: [id number]  Chief Complaint  Patient presents with  . Follow-up    2 month recheck right shoulder s/p therapy DOS 11/09/12    6 months status post rotator cuff repair she now has full forward elevation. No significant pain just mild soreness and occasional ache  System review normal  BP 138/82  Ht 5' 2.5" (1.588 m)  Wt 219 lb (99.338 kg)  BMI 39.39 kg/m2 General appearance is normal, the patient is alert and oriented x3 with normal mood and affect. Forward  elevation 150 active 180 passive good strength in the rotator cuff excellent external rotation scans intact pulses are good sensation is normal  Encounter Diagnoses  Name Primary?  . S/P rotator cuff repair   . Rotator cuff tear, right Yes    Return 6 months for one year followup  Meds ordered this encounter  Medications  . HYDROcodone-acetaminophen (NORCO) 7.5-325 MG per tablet    Sig: Take 1 tablet by mouth every 4 (four) hours as needed.    Dispense:  90 tablet    Refill:  0

## 2013-06-04 NOTE — Patient Instructions (Signed)
Continue exercises at home  

## 2013-06-12 ENCOUNTER — Other Ambulatory Visit: Payer: Self-pay | Admitting: Family Medicine

## 2013-06-26 ENCOUNTER — Telehealth: Payer: Self-pay

## 2013-06-27 NOTE — Telephone Encounter (Signed)
Attempted to reach patient by x2.  Received an order from [place].  Unable to speak with patient.  No option to leave message.  Will await patient call back.  Will also send letter to contact office.

## 2013-07-13 ENCOUNTER — Other Ambulatory Visit: Payer: Self-pay | Admitting: Family Medicine

## 2013-07-23 ENCOUNTER — Other Ambulatory Visit: Payer: Self-pay | Admitting: Family Medicine

## 2013-08-02 LAB — HEMOGLOBIN A1C
HEMOGLOBIN A1C: 7.4 % — AB (ref <5.7)
Mean Plasma Glucose: 166 mg/dL — ABNORMAL HIGH (ref <117)

## 2013-08-02 LAB — COMPLETE METABOLIC PANEL WITH GFR
ALT: 15 U/L (ref 0–35)
AST: 20 U/L (ref 0–37)
Albumin: 3.9 g/dL (ref 3.5–5.2)
Alkaline Phosphatase: 83 U/L (ref 39–117)
BUN: 9 mg/dL (ref 6–23)
CO2: 25 mEq/L (ref 19–32)
CREATININE: 0.72 mg/dL (ref 0.50–1.10)
Calcium: 9.4 mg/dL (ref 8.4–10.5)
Chloride: 106 mEq/L (ref 96–112)
GFR, Est African American: >89 mL/min
GFR, Est Non African American: 87 mL/min
Glucose, Bld: 148 mg/dL — ABNORMAL HIGH (ref 70–99)
Potassium: 4 mEq/L (ref 3.5–5.3)
SODIUM: 141 meq/L (ref 135–145)
TOTAL PROTEIN: 6.5 g/dL (ref 6.0–8.3)
Total Bilirubin: 0.4 mg/dL (ref 0.2–1.2)

## 2013-08-09 ENCOUNTER — Ambulatory Visit (INDEPENDENT_AMBULATORY_CARE_PROVIDER_SITE_OTHER): Payer: Medicare HMO | Admitting: Family Medicine

## 2013-08-09 ENCOUNTER — Encounter: Payer: Self-pay | Admitting: Family Medicine

## 2013-08-09 VITALS — BP 150/90 | HR 99 | Resp 16 | Wt 218.0 lb

## 2013-08-09 DIAGNOSIS — E785 Hyperlipidemia, unspecified: Secondary | ICD-10-CM

## 2013-08-09 DIAGNOSIS — E1165 Type 2 diabetes mellitus with hyperglycemia: Secondary | ICD-10-CM

## 2013-08-09 DIAGNOSIS — IMO0001 Reserved for inherently not codable concepts without codable children: Secondary | ICD-10-CM

## 2013-08-09 DIAGNOSIS — I10 Essential (primary) hypertension: Secondary | ICD-10-CM

## 2013-08-09 DIAGNOSIS — IMO0002 Reserved for concepts with insufficient information to code with codable children: Secondary | ICD-10-CM

## 2013-08-09 DIAGNOSIS — G4700 Insomnia, unspecified: Secondary | ICD-10-CM

## 2013-08-09 DIAGNOSIS — E669 Obesity, unspecified: Secondary | ICD-10-CM

## 2013-08-09 MED ORDER — ZOLPIDEM TARTRATE 10 MG PO TABS: ORAL_TABLET | ORAL | Status: DC

## 2013-08-09 MED ORDER — SPIRONOLACTONE 100 MG PO TABS: 100.0000 mg | ORAL_TABLET | Freq: Every day | ORAL | Status: DC

## 2013-08-09 MED ORDER — DAPAGLIFLOZIN PROPANEDIOL 5 MG PO TABS: 1.0000 | ORAL_TABLET | Freq: Every day | ORAL | Status: DC

## 2013-08-09 NOTE — Patient Instructions (Addendum)
Annual physical exam in mid September, call if you need mebefore  Congrtats on improved blood sugar  If unable to obtain farxiga 8m tabs , then you will need to take glipizide 122mand 2.5 tabs , so you will need to call for the  2.5 mg tablet to be taken at suppertime  BP is still too high, increase spironolactone to 10074maily, OK to take Spironolactone 68m72mwo daily till done  It is important that you exercise regularly at least 30 minutes 5 times a week. If you develop chest pain, have severe difficulty breathing, or feel very tired, stop exercising immediately and seek medical attention   \ Fasting lipid, chem 7 amd EGFR and HBa1C , and cBC in September 3 days before vist

## 2013-08-10 NOTE — Assessment & Plan Note (Signed)
Sleep hygiene reviewed and written information offered also. Prescription sent for  medication needed.  

## 2013-08-10 NOTE — Assessment & Plan Note (Signed)
Uncontrolled, inc spironolactone DASH diet and commitment to daily physical activity for a minimum of 30 minutes discussed and encouraged, as a part of hypertension management. The importance of attaining a healthy weight is also discussed.

## 2013-08-10 NOTE — Assessment & Plan Note (Signed)
Controlled, no change in medication Hyperlipidemia:Low fat diet discussed and encouraged.  Updated lab needed at/ before next visit.  

## 2013-08-10 NOTE — Assessment & Plan Note (Signed)
Marked improvement, pt to try to obtain farxiga, if unable, she will inc glipizide to 12.5mg  daily total Patient advised to reduce carb and sweets, commit to regular physical activity, take meds as prescribed, test blood as directed, and attempt to lose weight, to improve blood sugar control.

## 2013-08-10 NOTE — Progress Notes (Signed)
   Subjective:    Patient ID: Tiffany Jacobs, female    DOB: 1945/03/29, 68 y.o.   MRN: [id number]  HPI The PT is here for follow up and re-evaluation of chronic medical conditions, medication management and review of any available recent lab and radiology data.  Preventive health is updated, specifically  Cancer screening and Immunization.   She had experienced hypoglycemia down to the 40's on farxiga 10 mg so she reduced to 5mg  , better control obtained. She is working on lifestyle change also There are no new concerns. Except access to medication farxiga, which we had been obtaining for her There are no specific complaints       Review of Systems See HPI Denies recent fever or chills. Denies sinus pressure, nasal congestion, ear pain or sore throat. Denies chest congestion, productive cough or wheezing. Denies chest pains, palpitations and leg swelling Denies abdominal pain, nausea, vomiting,diarrhea or constipation.   Denies dysuria, frequency, hesitancy or incontinence. Denies joint pain, swelling and limitation in mobility. Denies headaches, seizures, numbness, or tingling. Denies depression, anxiety or insomnia. Denies skin break down or rash.        Objective:   Physical Exam BP 150/90  Pulse 99  Resp 16  Wt 218 lb (98.884 kg)  SpO2 96% Patient alert and oriented and in no cardiopulmonary distress.  HEENT: No facial asymmetry, EOMI, no sinus tenderness,  oropharynx pink and moist.  Neck supple no adenopathy.  Chest: Clear to auscultation bilaterally.  CVS: S1, S2 no murmurs, no S3.  ABD: Soft non tender. Bowel sounds normal.  Ext: No edema  MS: Adequate ROM spine, shoulders, hips and knees.  Skin: Intact, no ulcerations or rash noted.  Psych: Good eye contact, normal affect. Memory intact not anxious or depressed appearing.  CNS: CN 2-12 intact, power, tone and sensation normal throughout.        Assessment & Plan:  ESSENTIAL  HYPERTENSION Uncontrolled, inc spironolactone DASH diet and commitment to daily physical activity for a minimum of 30 minutes discussed and encouraged, as a part of hypertension management. The importance of attaining a healthy weight is also discussed.   DM (diabetes mellitus), type 2, uncontrolled Marked improvement, pt to try to obtain farxiga, if unable, she will inc glipizide to 12.5mg  daily total Patient advised to reduce carb and sweets, commit to regular physical activity, take meds as prescribed, test blood as directed, and attempt to lose weight, to improve blood sugar control.   OBESITY Unchanged Patient re-educated about  the importance of commitment to a  minimum of 150 minutes of exercise per week. The importance of healthy food choices with portion control discussed. Encouraged to start a food diary, count calories and to consider  joining a support group. Sample diet sheets offered. Goals set by the patient for the next several months.     Hyperlipidemia LDL goal < 70 Controlled, no change in medication Hyperlipidemia:Low fat diet discussed and encouraged.  Updated lab needed at/ before next visit.   Insomnia Sleep hygiene reviewed and written information offered also. Prescription sent for  medication needed.

## 2013-08-10 NOTE — Assessment & Plan Note (Signed)
Unchanged. Patient re-educated about  the importance of commitment to a  minimum of 150 minutes of exercise per week. The importance of healthy food choices with portion control discussed. Encouraged to start a food diary, count calories and to consider  joining a support group. Sample diet sheets offered. Goals set by the patient for the next several months.    

## 2013-08-12 ENCOUNTER — Other Ambulatory Visit: Payer: Self-pay | Admitting: Family Medicine

## 2013-09-06 ENCOUNTER — Other Ambulatory Visit: Payer: Self-pay | Admitting: Family Medicine

## 2013-09-11 ENCOUNTER — Other Ambulatory Visit: Payer: Self-pay

## 2013-09-11 DIAGNOSIS — E1165 Type 2 diabetes mellitus with hyperglycemia: Secondary | ICD-10-CM

## 2013-09-11 DIAGNOSIS — IMO0002 Reserved for concepts with insufficient information to code with codable children: Secondary | ICD-10-CM

## 2013-09-11 MED ORDER — DAPAGLIFLOZIN PROPANEDIOL 5 MG PO TABS: 1.0000 | ORAL_TABLET | Freq: Every day | ORAL | Status: DC

## 2013-09-12 ENCOUNTER — Other Ambulatory Visit: Payer: Self-pay | Admitting: Family Medicine

## 2013-09-14 ENCOUNTER — Telehealth: Payer: Self-pay | Admitting: Family Medicine

## 2013-09-14 ENCOUNTER — Other Ambulatory Visit: Payer: Self-pay | Admitting: Family Medicine

## 2013-09-14 NOTE — Telephone Encounter (Signed)
Pls contact pt and pharmacy. Explain that invokana is prescribed as farxiga not covered and fax in script entered, thanks  ??pls ask

## 2013-09-16 MED ORDER — CANAGLIFLOZIN 100 MG PO TABS: 1.0000 | ORAL_TABLET | Freq: Every day | ORAL | Status: DC

## 2013-09-16 NOTE — Addendum Note (Signed)
Addended by: Eual Fines on: 09/16/2013 03:07 PM   Modules accepted: Orders

## 2013-09-16 NOTE — Telephone Encounter (Signed)
Called patient and left message for them to return call at the office   

## 2013-09-16 NOTE — Telephone Encounter (Signed)
Phone line still busy when attempted later

## 2013-09-16 NOTE — Telephone Encounter (Signed)
Called pt phone busy x 2

## 2013-09-16 NOTE — Telephone Encounter (Signed)
Pt aware and med sent  

## 2013-10-10 LAB — CBC
HCT: 37.5 % (ref 36.0–46.0)
HEMOGLOBIN: 12.7 g/dL (ref 12.0–15.0)
MCH: 27.5 pg (ref 26.0–34.0)
MCHC: 33.9 g/dL (ref 30.0–36.0)
MCV: 81.3 fL (ref 78.0–100.0)
Platelets: 324 10*3/uL (ref 150–400)
RBC: 4.61 MIL/uL (ref 3.87–5.11)
RDW: 15.3 % (ref 11.5–15.5)
WBC: 7.1 10*3/uL (ref 4.0–10.5)

## 2013-10-10 LAB — COMPLETE METABOLIC PANEL WITH GFR
ALT: 20 U/L (ref 0–35)
AST: 22 U/L (ref 0–37)
Albumin: 4.1 g/dL (ref 3.5–5.2)
Alkaline Phosphatase: 86 U/L (ref 39–117)
BUN: 9 mg/dL (ref 6–23)
CALCIUM: 9.7 mg/dL (ref 8.4–10.5)
CHLORIDE: 101 meq/L (ref 96–112)
CO2: 30 mEq/L (ref 19–32)
CREATININE: 0.72 mg/dL (ref 0.50–1.10)
GFR, EST NON AFRICAN AMERICAN: 86 mL/min
GFR, Est African American: >89 mL/min
GLUCOSE: 153 mg/dL — AB (ref 70–99)
POTASSIUM: 4.2 meq/L (ref 3.5–5.3)
Sodium: 138 mEq/L (ref 135–145)
Total Bilirubin: 0.4 mg/dL (ref 0.2–1.2)
Total Protein: 6.8 g/dL (ref 6.0–8.3)

## 2013-10-10 LAB — LIPID PANEL
CHOLESTEROL: 132 mg/dL (ref 0–200)
HDL: 53 mg/dL (ref >39)
LDL Cholesterol: 66 mg/dL (ref 0–99)
TRIGLYCERIDES: 67 mg/dL (ref <150)
Total CHOL/HDL Ratio: 2.5 Ratio
VLDL: 13 mg/dL (ref 0–40)

## 2013-10-10 LAB — HEMOGLOBIN A1C
HEMOGLOBIN A1C: 8.3 % — AB (ref <5.7)
Mean Plasma Glucose: 192 mg/dL — ABNORMAL HIGH (ref <117)

## 2013-10-11 ENCOUNTER — Telehealth: Payer: Self-pay | Admitting: Family Medicine

## 2013-10-11 ENCOUNTER — Other Ambulatory Visit: Payer: Self-pay | Admitting: Family Medicine

## 2013-10-11 NOTE — Telephone Encounter (Signed)
Patient aware and understands

## 2013-10-11 NOTE — Telephone Encounter (Signed)
Pt needs to take glipizide 10mg  ( regular , not long acting, more affordable ) daily in the morning with breakfast, and take the 2.5 mg tablet ( again regular) with the evening meal every day. Needs to continue metformin as she is doing [place] 5 mg is for once daily use, I know in the past she has reported some "lows" I recommend she split the farxiga in half if that is possible , rather than every other day.  I do believe that her blood sugar requires the 5 mg dose daily, however since she has not even been taking 10mg  glipizide, she needs to start on correct dose of glipizide 12.5 mg total as above and the metformin as before  She needs to take responsibility, test sugars as required once daily, and if they are within the range then no need to use the farxiga , if they are too high consistently despite carb counting and activity , take farxiga as directred

## 2013-10-17 ENCOUNTER — Ambulatory Visit (INDEPENDENT_AMBULATORY_CARE_PROVIDER_SITE_OTHER): Payer: Medicare HMO | Admitting: Otolaryngology

## 2013-10-30 ENCOUNTER — Emergency Department (HOSPITAL_COMMUNITY)
Admission: EM | Admit: 2013-10-30 | Discharge: 2013-10-30 | Disposition: A | Discharge status: Home / Self Care | Payer: Medicare HMO | Attending: Emergency Medicine | Admitting: Emergency Medicine

## 2013-10-30 ENCOUNTER — Encounter (HOSPITAL_COMMUNITY): Payer: Self-pay | Admitting: Emergency Medicine

## 2013-10-30 DIAGNOSIS — E669 Obesity, unspecified: Secondary | ICD-10-CM | POA: Diagnosis not present

## 2013-10-30 DIAGNOSIS — I10 Essential (primary) hypertension: Secondary | ICD-10-CM | POA: Insufficient documentation

## 2013-10-30 DIAGNOSIS — Z79899 Other long term (current) drug therapy: Secondary | ICD-10-CM | POA: Insufficient documentation

## 2013-10-30 DIAGNOSIS — IMO0002 Reserved for concepts with insufficient information to code with codable children: Secondary | ICD-10-CM | POA: Insufficient documentation

## 2013-10-30 DIAGNOSIS — J45909 Unspecified asthma, uncomplicated: Secondary | ICD-10-CM | POA: Insufficient documentation

## 2013-10-30 DIAGNOSIS — M79609 Pain in unspecified limb: Secondary | ICD-10-CM | POA: Insufficient documentation

## 2013-10-30 DIAGNOSIS — Z7982 Long term (current) use of aspirin: Secondary | ICD-10-CM | POA: Insufficient documentation

## 2013-10-30 DIAGNOSIS — E119 Type 2 diabetes mellitus without complications: Secondary | ICD-10-CM | POA: Insufficient documentation

## 2013-10-30 DIAGNOSIS — Z8719 Personal history of other diseases of the digestive system: Secondary | ICD-10-CM | POA: Insufficient documentation

## 2013-10-30 DIAGNOSIS — Z87891 Personal history of nicotine dependence: Secondary | ICD-10-CM | POA: Insufficient documentation

## 2013-10-30 DIAGNOSIS — M5412 Radiculopathy, cervical region: Secondary | ICD-10-CM | POA: Diagnosis not present

## 2013-10-30 MED ORDER — OXYCODONE-ACETAMINOPHEN 5-325 MG PO TABS: 1.0000 | ORAL_TABLET | ORAL | Status: DC | PRN

## 2013-10-30 MED ORDER — PREDNISONE 20 MG PO TABS: 20.0000 mg | ORAL_TABLET | Freq: Every day | ORAL | Status: DC

## 2013-10-30 NOTE — ED Notes (Signed)
Having left arm pain and left shoulder pain. Pain started last week and I was worried about taking more meds since they were not taking the pain away. Thought is could be heart related.

## 2013-10-30 NOTE — ED Provider Notes (Signed)
CSN: [id number]     Arrival date & time 10/30/13  1923 History   First MD Initiated Contact with Patient 10/30/13 1931     Chief Complaint  Patient presents with  . Arm Pain     (Consider location/radiation/quality/duration/timing/severity/associated sxs/prior Treatment) HPI  CC-year-old female with left-sided neck pain. Gradual onset about a week ago. Pain is persistent. Pain radiates from the left side of her neck into her left shoulder and into her left upper arm. Pain is exacerbated with movement. No acute numbness, tingling or loss of strength. She denies any trauma. No fevers or chills. No history similar type pain previous to sit onset about a week ago. She has been taking some over-the-counter pain medication with only mild relief. She has some concerns that her pain may be secondary to cardiac etiology which is why she presented today. She denies any chest pain. Shortness of breath. No dizziness or lightheadedness. No nausea or diaphoresis.  Past Medical History  Diagnosis Date  . Hypertension   . Diabetes mellitus, type 2   . Sciatica   . Low back pain   . Obesity   . Seasonal allergies   . Asthma   . RAD (reactive airway disease)   . GERD (gastroesophageal reflux disease)   . Arthritis    Past Surgical History  Procedure Laterality Date  . Total knee arthroplasty left  02-01-05    Dr. Aline Brochure  . Tubal ligation  1970  . Left breast biopsy for benign disease    . Shoulder open rotator cuff repair Right 11/09/2012    Procedure: ROTATOR CUFF REPAIR Right SHOULDER OPEN;  Surgeon: Carole Civil, MD;  Location: [facility];  Service: Orthopedics;  Laterality: Right;  . Shoulder acromioplasty Right 11/09/2012    Procedure: RIGHT SHOULDER ACROMIOPLASTY;  Surgeon: Carole Civil, MD;  Location: [facility];  Service: Orthopedics;  Laterality: Right;  . Acromio-clavicular joint repair Right 11/09/2012    Procedure: ACROMIO-CLAVICULAR JOINT REPAIR;  Surgeon: Carole Civil, MD;   Location: [facility];  Service: Orthopedics;  Laterality: Right;   Family History  Problem Relation Age of Onset  . Kidney failure Father   . Mental illness Brother   . Diabetes Brother   . Hypertension Brother   . Asthma    . Lung disease    . Cancer    . Heart disease    . Arthritis     History  Substance Use Topics  . Smoking status: Former Smoker -- 1.00 packs/day for 30 years    Types: Cigarettes  . Smokeless tobacco: Former Systems developer    Quit date: 11/02/1988  . Alcohol Use: No   OB History   Grav Para Term Preterm Abortions TAB SAB Ect Mult Living                 Review of Systems  All systems reviewed and negative, other than as noted in HPI.   Allergies  Ace inhibitors  Home Medications   Prior to Admission medications   Medication Sig Start Date End Date Taking? Authorizing Provider  budesonide-formoterol (SYMBICORT) 160-4.5 MCG/ACT inhaler Inhale 2 puffs into the lungs 2 (two) times daily. 03/28/13  Yes Fayrene Helper, MD  Dapagliflozin Propanediol 5 MG TABS Take 1 tablet by mouth 2 (two) times daily.  10/11/13  Yes Fayrene Helper, MD  glipiZIDE (GLUCOTROL) 10 MG tablet Take 1 tablet (10 mg total) by mouth daily. 09/20/12  Yes Fayrene Helper, MD  amLODipine ([place]) 10  MG tablet TAKE ONE TABLET BY MOUTH ONCE DAILY. 09/12/13   Fayrene Helper, MD  aspirin (ASPIRIN LOW DOSE) 81 MG EC tablet Take 81 mg by mouth every morning.     Historical Provider, MD  calcium-vitamin D (OSCAL 500/200 D-3) 500-200 MG-UNIT per tablet Take 1 tablet by mouth 2 (two) times daily. 01/18/13 01/18/14  Fayrene Helper, MD  fluticasone (FLONASE) 50 MCG/ACT nasal spray Place 2 sprays into the nose daily.    Historical Provider, MD  glipiZIDE (GLUCOTROL) 2.5 mg TABS tablet Take 2.5 mg by mouth daily. One tablet daily with the evening meal 10/11/13   Fayrene Helper, MD  HYDROcodone-acetaminophen [facility]) 7.5-325 MG per tablet Take 1 tablet by mouth every 4 (four) hours as needed.  06/04/13   Carole Civil, MD  hydrOXYzine (ATARAX/VISTARIL) 25 MG tablet TAKE ONE TABLET BY MOUTH AT NIGHT FOR ITCHING. 08/12/13   Fayrene Helper, MD  ibuprofen (ADVIL,MOTRIN) 200 MG tablet Take 200 mg by mouth every 6 (six) hours as needed for pain.    Historical Provider, MD  ipratropium-albuterol (DUONEB) 0.5-2.5 (3) MG/3ML SOLN Take 3 mLs by nebulization. Inhale 1 vial thru nebulizer every 6-8 hours as needed for wheezing     Historical Provider, MD  Lancets (ACCU-CHEK MULTICLIX) lancets USE AS DIRECTED TO TEST BLOOD SUGAR ONCE DAILY. 11/26/12   Fayrene Helper, MD  metFORMIN (GLUCOPHAGE) 1000 MG tablet TAKE (1) TABLET BY MOUTH TWICE DAILY WITH MEALS.    Fayrene Helper, MD  montelukast (SINGULAIR) 10 MG tablet Take 1 tablet (10 mg total) by mouth at bedtime. 03/20/13   Fayrene Helper, MD  pravastatin (PRAVACHOL) 40 MG tablet TAKE (1) TABLET BY MOUTH AT BEDTIME FOR CHOLESTEROL. 07/23/13   Fayrene Helper, MD  spironolactone (ALDACTONE) 100 MG tablet Take 1 tablet (100 mg total) by mouth daily. 08/09/13   Fayrene Helper, MD  zolpidem (AMBIEN) 10 MG tablet TAKE (1) TABLET BY MOUTH AT BEDTIME AS NEEDED FOR SLEEP. 08/09/13   Fayrene Helper, MD   BP 136/72  Pulse 103  Temp(Src) 98.4 F (36.9 C) (Oral)  Resp 20  Ht 5\' 2"  (1.575 m)  Wt 212 lb (96.163 kg)  BMI 38.77 kg/m2  SpO2 97% Physical Exam  Nursing note and vitals reviewed. Constitutional: She is oriented to person, place, and time. She appears well-developed and well-nourished. No distress.  HENT:  Head: Normocephalic and atraumatic.  Eyes: Conjunctivae are normal. Right eye exhibits no discharge. Left eye exhibits no discharge.  Neck: Neck supple.  Cardiovascular: Normal rate, regular rhythm and normal heart sounds.  Exam reveals no gallop and no friction rub.   No murmur heard. Pulmonary/Chest: Effort normal and breath sounds normal. No respiratory distress.  Abdominal: Soft. She exhibits no distension. There is  no tenderness.  Musculoskeletal: She exhibits no edema and no tenderness.  No midline spinal tenderness. There is mild tenderness to palpation along the left cervical musculature.  Neurological: She is alert and oriented to person, place, and time. No cranial nerve deficit. She exhibits normal muscle tone. Coordination normal.  Strength is 5 bilateral upper extremities. Sensation is intact to light touch. Easily palpable radial pulses. There are symmetric.  Skin: Skin is warm and dry.  Psychiatric: She has a normal mood and affect. Her behavior is normal. Thought content normal.    ED Course  Procedures (including critical care time) Labs Review Labs Reviewed - No data to display  Imaging Review No results found.  EKG Interpretation None      MDM   Final diagnoses:  Cervical radiculopathy    CC-year-old female with likely a cervical radiculopathy. No acute neurological complaints a nonfocal neurological exam. Vascularly intact. Plan symptomatic treatment. Return precautions were discussed.    Virgel Manifold, MD 11/06/13 [phone]

## 2013-10-30 NOTE — Discharge Instructions (Signed)

## 2013-11-04 ENCOUNTER — Ambulatory Visit (HOSPITAL_COMMUNITY)
Admission: RE | Admit: 2013-11-04 | Discharge: 2013-11-04 | Disposition: A | Discharge status: Home / Self Care | Payer: Medicare HMO | Source: Ambulatory Visit | Attending: Family Medicine | Admitting: Family Medicine

## 2013-11-04 ENCOUNTER — Encounter: Payer: Medicare HMO | Admitting: Family Medicine

## 2013-11-04 ENCOUNTER — Encounter: Payer: Self-pay | Admitting: Family Medicine

## 2013-11-04 ENCOUNTER — Ambulatory Visit (INDEPENDENT_AMBULATORY_CARE_PROVIDER_SITE_OTHER): Payer: Medicare HMO | Admitting: Family Medicine

## 2013-11-04 VITALS — BP 128/74 | HR 88 | Resp 18 | Ht 62.5 in | Wt 214.0 lb

## 2013-11-04 DIAGNOSIS — M5412 Radiculopathy, cervical region: Secondary | ICD-10-CM

## 2013-11-04 DIAGNOSIS — M25519 Pain in unspecified shoulder: Secondary | ICD-10-CM | POA: Diagnosis not present

## 2013-11-04 DIAGNOSIS — M5030 Other cervical disc degeneration, unspecified cervical region: Secondary | ICD-10-CM | POA: Insufficient documentation

## 2013-11-04 DIAGNOSIS — IMO0002 Reserved for concepts with insufficient information to code with codable children: Secondary | ICD-10-CM

## 2013-11-04 DIAGNOSIS — Z0000 Encounter for general adult medical examination without abnormal findings: Secondary | ICD-10-CM | POA: Insufficient documentation

## 2013-11-04 DIAGNOSIS — E1165 Type 2 diabetes mellitus with hyperglycemia: Secondary | ICD-10-CM

## 2013-11-04 DIAGNOSIS — M542 Cervicalgia: Secondary | ICD-10-CM | POA: Diagnosis present

## 2013-11-04 DIAGNOSIS — Z91199 Patient's noncompliance with other medical treatment and regimen due to unspecified reason: Secondary | ICD-10-CM

## 2013-11-04 DIAGNOSIS — IMO0001 Reserved for inherently not codable concepts without codable children: Secondary | ICD-10-CM

## 2013-11-04 DIAGNOSIS — M5010 Cervical disc disorder with radiculopathy, unspecified cervical region: Secondary | ICD-10-CM

## 2013-11-04 DIAGNOSIS — Z9119 Patient's noncompliance with other medical treatment and regimen: Secondary | ICD-10-CM

## 2013-11-04 MED ORDER — PREDNISONE 5 MG PO TABS: 5.0000 mg | ORAL_TABLET | Freq: Two times a day (BID) | ORAL | Status: AC

## 2013-11-04 MED ORDER — GLIPIZIDE ER 5 MG PO TB24: ORAL_TABLET | ORAL | Status: DC

## 2013-11-04 MED ORDER — GABAPENTIN 300 MG PO CAPS: 300.0000 mg | ORAL_CAPSULE | Freq: Every day | ORAL | Status: DC

## 2013-11-04 MED ORDER — MONTELUKAST SODIUM 10 MG PO TABS: 10.0000 mg | ORAL_TABLET | Freq: Every day | ORAL | Status: DC

## 2013-11-04 MED ORDER — KETOROLAC TROMETHAMINE 60 MG/2ML IM SOLN
60.0000 mg | Freq: Once | INTRAMUSCULAR | Status: AC
  Administered 2013-11-04: 60 mg via INTRAMUSCULAR

## 2013-11-04 NOTE — Progress Notes (Signed)
Subjective:    Patient ID: Tiffany Jacobs, female    DOB: [birth year], 68 y.o.   MRN: [id number]  HPI Preventive Screening-Counseling & Management   Patient present here today for a subsequent  Medicare annual wellness visit. 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 No  Over the past two weeks, have you felt little interest or pleasure in doing things? No  Have you lost interest or pleasure in daily life? No  Do you often feel hopeless? No  Do you cry easily over simple problems? No   Activities of Daily Living  In your present state of health, do you have any difficulty performing the following activities?  Driving?: No Managing money?: No Feeding yourself?:No Getting from bed to chair?:yes due to stiffness Climbing a flight of stairs?:yes , arthritis in right  knee, and "bad right leg" Preparing food and eating?:No Bathing or showering?:No Getting dressed?:No Getting to the toilet?:No Using the toilet?:No Moving around from place  to place?: Yes, walking long distances   Fall Risk Assessment In the past year have you fallen or had a near fall?:No Are you currently taking any medications that make you dizzy?:No   Hearing Difficulties: No Do you often ask people to speak up or repeat themselves?:No Do you experience ringing or noises in your ears?:No Do you have difficulty understanding soft or whispered voices?:No  Cognitive Testing  Alert? Yes Normal Appearance?Yes  Oriented to person? Yes Place? Yes  Time? Yes  Displays appropriate judgment?Yes  Can read the correct time from a watch face? yes Are you having problems remembering things?No  Advanced Directives have been discussed with the patient?Yes and brochure provided , she is full code   List the Names of Other Physician/Practitioners you currently use: see Care Team list    Indicate any recent Medical Services you may have received from other than [facility] providers in the past year (date may be approximate).   Assessment:    Annual Wellness Exam   Plan:    .  Medicare Attestation  I have personally reviewed:  The patient's medical and social history  Their use of alcohol, tobacco or illicit drugs  Their current medications and supplements  The patient's functional ability including ADLs,fall risks, home safety risks, cognitive, and hearing and visual impairment  Diet and physical activities  Evidence for depression or mood disorders  The patient's weight, height, BMI, and visual acuity have been recorded in the chart. I have  made referrals, counseling, and provided education to the patient based on review of the above and I have provided the patient with a written personalized care plan for preventive services.      Review of Systems     Objective:   Physical Exam  BP 128/74  Pulse 88  Resp 18  Ht 5' 2.5" (1.588 m)  Wt 214 lb 0.6 oz (97.088 kg)  BMI 38.50 kg/m2  SpO2 95% Patient alert and oriented and in no cardiopulmonary  distress.  HEENT: No facial asymmetry, EOMI,   oropharynx pink and moist.  Neck decreased ROM with left neck and shoulder spasm no JVD, no mass.  Chest: Clear to auscultation bilaterally.  CVS: S1, S2 no murmurs, no S3.Regular rate.  ABD: Soft non tender.   Ext: No edema  MS: decreased  ROM cervical spine, and left  shoulder, adequate in hips and knees.       Assessment & Plan:  Medicare annual wellness visit, subsequent Annual exam as documented. Counseling done  re healthy lifestyle involving commitment to 150 minutes exercise per week, heart healthy diet, and attaining healthy weight.The importance of adequate sleep also discussed. Regular seat belt use and safe storage  of firearms if patient has them, is also discussed. Changes in health habits are decided on by the patient with goals and time frames  set for achieving them. Immunization and cancer screening needs are specifically addressed at this visit.Refuses flu vaccine   DM (diabetes mellitus), type 2, uncontrolled FBG average  160 to 200, inc glipizide2. 5mg  tab t  Cervical disc disorder with radiculopathy of cervical region Neck pain with radiation, needs xray of neck , toradol in office and trial of gabapentin  Personal history of noncompliance with medical treatment, presenting hazards to health Refusing both flu and prevnar vaccines today,either of which should be given, states she got "very sick" after a flu vaccine she had received in the past

## 2013-11-04 NOTE — Assessment & Plan Note (Addendum)
Annual exam as documented. Counseling done  re healthy lifestyle involving commitment to 150 minutes exercise per week, heart healthy diet, and attaining healthy weight.The importance of adequate sleep also discussed. Regular seat belt use and safe storage  of firearms if patient has them, is also discussed. Changes in health habits are decided on by the patient with goals and time frames  set for achieving them. Immunization and cancer screening needs are specifically addressed at this visit.Refuses flu vaccine

## 2013-11-04 NOTE — Patient Instructions (Signed)
F/u in mid  Nov, call if you need me before  For neck pain toradol in office, prednisone prescribed for 5 days , and gabapentin at night , one capsule  Pls get an xray of your neck today, I believe the problem is in your neck  Increase glipizide 2.5 mg tablets to TWO 2.67m tablets daily , take them together, and new script will be for glipizide 543mone daily when you finish this  Chem 7 and EGFr, HBA1C in Nov before visit

## 2013-11-04 NOTE — Assessment & Plan Note (Signed)
FBG average  160 to 200, inc glipizide2. 5mg  tab t

## 2013-11-05 ENCOUNTER — Telehealth: Payer: Self-pay | Admitting: *Deleted

## 2013-11-05 NOTE — Telephone Encounter (Signed)
Pt called stating she is returning a call to the nurse. [phone]

## 2013-11-05 NOTE — Telephone Encounter (Signed)
Patient aware of results.

## 2013-11-07 ENCOUNTER — Other Ambulatory Visit: Payer: Self-pay

## 2013-11-07 MED ORDER — PRAVASTATIN SODIUM 40 MG PO TABS: ORAL_TABLET | ORAL | Status: DC

## 2013-11-10 NOTE — Assessment & Plan Note (Signed)
Neck pain with radiation, needs xray of neck , toradol in office and trial of gabapentin

## 2013-11-10 NOTE — Assessment & Plan Note (Signed)
Refusing both flu and prevnar vaccines today,either of which should be given, states she got "very sick" after a flu vaccine she had received in the past

## 2013-11-14 ENCOUNTER — Ambulatory Visit (INDEPENDENT_AMBULATORY_CARE_PROVIDER_SITE_OTHER): Payer: Medicare HMO | Admitting: Otolaryngology

## 2013-11-14 DIAGNOSIS — J330 Polyp of nasal cavity: Secondary | ICD-10-CM

## 2013-12-05 ENCOUNTER — Ambulatory Visit: Payer: Medicare HMO | Admitting: Orthopedic Surgery

## 2013-12-12 ENCOUNTER — Telehealth: Payer: Self-pay | Admitting: Family Medicine

## 2013-12-12 DIAGNOSIS — IMO0002 Reserved for concepts with insufficient information to code with codable children: Secondary | ICD-10-CM

## 2013-12-12 DIAGNOSIS — E1165 Type 2 diabetes mellitus with hyperglycemia: Secondary | ICD-10-CM

## 2013-12-12 NOTE — Telephone Encounter (Signed)
Referred.

## 2013-12-17 ENCOUNTER — Ambulatory Visit (INDEPENDENT_AMBULATORY_CARE_PROVIDER_SITE_OTHER): Payer: Medicare HMO | Admitting: Orthopedic Surgery

## 2013-12-17 ENCOUNTER — Encounter: Payer: Self-pay | Admitting: Orthopedic Surgery

## 2013-12-17 VITALS — BP 130/78 | Ht 62.5 in | Wt 214.0 lb

## 2013-12-17 DIAGNOSIS — Z9889 Other specified postprocedural states: Secondary | ICD-10-CM

## 2013-12-17 DIAGNOSIS — M75102 Unspecified rotator cuff tear or rupture of left shoulder, not specified as traumatic: Secondary | ICD-10-CM

## 2013-12-17 NOTE — Progress Notes (Signed)
Chief Complaint  Patient presents with  . Follow-up    6 months recheck Right shoulder, RCR surgery 11/09/12    One year followup status post massive rotator cuff tear with open repair  The patient is not having any pain and has full functional use of her right shoulder with no limitations or restrictions or loss of motion  Review of systems pain left shoulder resolved with exercises  BP 130/78  Ht 5' 2.5" (1.588 m)  Wt 214 lb 0.6 oz (97.088 kg)  BMI 38.50 kg/m2 She is awake and alert she's oriented x3 mood is pleasant she has full elevation of her shoulder normal strength of the right shoulder no tenderness no swelling no instability  Left shoulder strength intact positive impingement sign  Recommend continue exercises left shoulder  Right shoulder status post open rotator cuff repair healed with excellent functional recovery. Followup will be as needed

## 2013-12-20 ENCOUNTER — Other Ambulatory Visit: Payer: Self-pay

## 2014-01-13 ENCOUNTER — Telehealth: Payer: Self-pay

## 2014-01-13 ENCOUNTER — Other Ambulatory Visit: Payer: Self-pay

## 2014-01-13 DIAGNOSIS — I10 Essential (primary) hypertension: Secondary | ICD-10-CM

## 2014-01-13 DIAGNOSIS — M5010 Cervical disc disorder with radiculopathy, unspecified cervical region: Secondary | ICD-10-CM

## 2014-01-13 MED ORDER — SPIRONOLACTONE 100 MG PO TABS: 100.0000 mg | ORAL_TABLET | Freq: Every day | ORAL | Status: DC

## 2014-01-13 MED ORDER — ALBUTEROL SULFATE HFA 108 (90 BASE) MCG/ACT IN AERS: 2.0000 | INHALATION_SPRAY | Freq: Four times a day (QID) | RESPIRATORY_TRACT | Status: DC | PRN

## 2014-01-13 MED ORDER — GABAPENTIN 300 MG PO CAPS: 300.0000 mg | ORAL_CAPSULE | Freq: Every day | ORAL | Status: DC

## 2014-01-13 NOTE — Telephone Encounter (Signed)
I am sending in albuterol mDI pl;s let her know for use oNLY if wheezing

## 2014-01-13 NOTE — Telephone Encounter (Signed)
Patient states nurse came to see her from her insurance and said that she needed a rescue inhaler called in to her pharmacy since she had asthma and did not have one. Please advise

## 2014-01-13 NOTE — Telephone Encounter (Signed)
Called pt no answer- nurse explained that came out to house that it was a "rescue inhaler" for emergencies

## 2014-01-13 NOTE — Telephone Encounter (Signed)
Called patient and left message for them to return call at the office   

## 2014-01-14 ENCOUNTER — Other Ambulatory Visit: Payer: Self-pay | Admitting: Family Medicine

## 2014-01-16 LAB — BASIC METABOLIC PANEL WITH GFR
BUN: 11 mg/dL (ref 6–23)
CO2: 30 meq/L (ref 19–32)
CREATININE: 0.72 mg/dL (ref 0.50–1.10)
Calcium: 9.7 mg/dL (ref 8.4–10.5)
Chloride: 101 mEq/L (ref 96–112)
GFR, Est African American: >89 mL/min
GFR, Est Non African American: 86 mL/min
Glucose, Bld: 147 mg/dL — ABNORMAL HIGH (ref 70–99)
POTASSIUM: 4.7 meq/L (ref 3.5–5.3)
Sodium: 140 mEq/L (ref 135–145)

## 2014-01-16 LAB — HEMOGLOBIN A1C
HEMOGLOBIN A1C: 7.5 % — AB (ref <5.7)
Mean Plasma Glucose: 169 mg/dL — ABNORMAL HIGH (ref <117)

## 2014-01-24 ENCOUNTER — Encounter: Payer: Self-pay | Admitting: Family Medicine

## 2014-01-24 ENCOUNTER — Ambulatory Visit (INDEPENDENT_AMBULATORY_CARE_PROVIDER_SITE_OTHER): Payer: Medicare HMO

## 2014-01-24 ENCOUNTER — Ambulatory Visit (INDEPENDENT_AMBULATORY_CARE_PROVIDER_SITE_OTHER): Payer: Medicare HMO | Admitting: Family Medicine

## 2014-01-24 VITALS — BP 120/74 | HR 100 | Resp 18 | Ht 63.0 in | Wt 217.1 lb

## 2014-01-24 DIAGNOSIS — E1165 Type 2 diabetes mellitus with hyperglycemia: Secondary | ICD-10-CM

## 2014-01-24 DIAGNOSIS — I10 Essential (primary) hypertension: Secondary | ICD-10-CM

## 2014-01-24 DIAGNOSIS — G4700 Insomnia, unspecified: Secondary | ICD-10-CM

## 2014-01-24 DIAGNOSIS — Z23 Encounter for immunization: Secondary | ICD-10-CM

## 2014-01-24 DIAGNOSIS — J302 Other seasonal allergic rhinitis: Secondary | ICD-10-CM

## 2014-01-24 DIAGNOSIS — IMO0001 Reserved for inherently not codable concepts without codable children: Secondary | ICD-10-CM

## 2014-01-24 DIAGNOSIS — J45991 Cough variant asthma: Secondary | ICD-10-CM

## 2014-01-24 DIAGNOSIS — E785 Hyperlipidemia, unspecified: Secondary | ICD-10-CM

## 2014-01-24 DIAGNOSIS — IMO0002 Reserved for concepts with insufficient information to code with codable children: Secondary | ICD-10-CM

## 2014-01-24 NOTE — Progress Notes (Signed)
   Subjective:    Patient ID: Tiffany Jacobs, female    DOB: 1945-04-15, 68 y.o.   MRN: [id number]  HPI The PT is here for follow up and re-evaluation of chronic medical conditions, medication management and review of any available recent lab and radiology data.  Preventive health is updated, specifically  Cancer screening and Immunization.   Questions or concerns regarding consultations or procedures which the PT has had in the interim are  addressed. The PT denies any adverse reactions to current medications since the last visit.      Review of Systems See HPI Denies recent fever or chills. Denies sinus pressure, nasal congestion, ear pain or sore throat. Denies chest congestion, productive cough or wheezing. Denies chest pains, palpitations and leg swelling Denies abdominal pain, nausea, vomiting,diarrhea or constipation.   Denies dysuria, frequency, hesitancy or incontinence. Denies uncontrolled joint pain, swelling and limitation in mobility. Denies headaches, seizures, numbness, or tingling. Concerned about inability to lose weight, large abdominal girth, and has insomnia if she does not take her medication Denies skin break down or rash.        Objective:   Physical Exam BP 120/74 mmHg  Pulse 100  Resp 18  Ht 5\' 3"  (1.6 m)  Wt 217 lb 1.9 oz (98.485 kg)  BMI 38.47 kg/m2  SpO2 96% Patient alert and oriented and in no cardiopulmonary distress.  HEENT: No facial asymmetry, EOMI,   oropharynx pink and moist.  Neck supple no JVD, no mass.  Chest: Clear to auscultation bilaterally.  CVS: S1, S2 no murmurs, no S3.Regular rate.  ABD: Soft non tender.   Ext: No edema  MS: Adequate ROM spine, shoulders, hips and knees.  Skin: Intact, no ulcerations or rash noted.  Psych: Good eye contact, normal affect. Memory intact not anxious or depressed appearing.  CNS: CN 2-12 intact, power,  normal throughout.no focal deficits noted.        Assessment & Plan:    Essential hypertension Controlled, no change in medication  DASH diet and commitment to daily physical activity for a minimum of 30 minutes discussed and encouraged, as a part of hypertension management. The importance of attaining a healthy weight is also discussed.   COUGH VARIANT ASTHMA Stable and controlled, no med change  DM (diabetes mellitus), type 2, uncontrolled Marked improvement, pt applauded on this Patient advised to reduce carb and sweets, commit to regular physical activity, take meds as prescribed, test blood as directed, and attempt to lose weight, to improve blood sugar control.   Hyperlipidemia Controlled, no change in medication Hyperlipidemia:Low fat diet discussed and encouraged.  Updated lab needed at/ before next visit.   Obesity, Class II, BMI 35.0-39.9, with comorbidity (see actual BMI) Deteriorated. Patient re-educated about  the importance of commitment to a  minimum of 150 minutes of exercise per week. The importance of healthy food choices with portion control discussed. Encouraged to start a food diary, count calories and to consider  joining a support group. Sample diet sheets offered. Goals set by the patient for the next several months.     Insomnia Sleep hygiene reviewed and written information offered also. Prescription sent for  medication needed.   Allergic rhinitis Controlled, no change in medication   Need for prophylactic vaccination and inoculation against influenza Vaccine administered at visit.

## 2014-01-24 NOTE — Patient Instructions (Addendum)
Annual physical exam in 4 month, call if you need me before  Blood pressure, cholesterol and blood sugar are much improved and excellent  Work on changing eating habits of overeating at night, cut back on starchy foods and portion size  You are referred for individual counselling re weight and diabetes management  TSH, fasting lipid, cmp and EGFR and HBA1C in 4 month and microalb  All the best, I know your waist will be smaller!  Flu vaccine today

## 2014-01-25 DIAGNOSIS — Z23 Encounter for immunization: Secondary | ICD-10-CM | POA: Insufficient documentation

## 2014-01-25 NOTE — Assessment & Plan Note (Signed)
Vaccine administered at visit.  

## 2014-01-25 NOTE — Assessment & Plan Note (Signed)
Controlled, no change in medication Hyperlipidemia:Low fat diet discussed and encouraged.  Updated lab needed at/ before next visit.  

## 2014-01-25 NOTE — Assessment & Plan Note (Signed)
Deteriorated. Patient re-educated about  the importance of commitment to a  minimum of 150 minutes of exercise per week. The importance of healthy food choices with portion control discussed. Encouraged to start a food diary, count calories and to consider  joining a support group. Sample diet sheets offered. Goals set by the patient for the next several months.    

## 2014-01-25 NOTE — Assessment & Plan Note (Signed)
Stable and controlled, no med change 

## 2014-01-25 NOTE — Assessment & Plan Note (Addendum)
Marked improvement, pt applauded on this Patient advised to reduce carb and sweets, commit to regular physical activity, take meds as prescribed, test blood as directed, and attempt to lose weight, to improve blood sugar control.

## 2014-01-25 NOTE — Assessment & Plan Note (Signed)
Controlled, no change in medication DASH diet and commitment to daily physical activity for a minimum of 30 minutes discussed and encouraged, as a part of hypertension management. The importance of attaining a healthy weight is also discussed.  

## 2014-01-25 NOTE — Assessment & Plan Note (Signed)
Controlled, no change in medication  

## 2014-01-25 NOTE — Assessment & Plan Note (Signed)
Sleep hygiene reviewed and written information offered also. Prescription sent for  medication needed.  

## 2014-02-21 ENCOUNTER — Other Ambulatory Visit: Payer: Self-pay | Admitting: Family Medicine

## 2014-03-06 ENCOUNTER — Encounter: Payer: Medicare HMO | Admitting: Family Medicine

## 2014-03-13 ENCOUNTER — Other Ambulatory Visit: Payer: Self-pay

## 2014-03-13 DIAGNOSIS — I10 Essential (primary) hypertension: Secondary | ICD-10-CM

## 2014-03-13 DIAGNOSIS — E1165 Type 2 diabetes mellitus with hyperglycemia: Secondary | ICD-10-CM

## 2014-03-13 DIAGNOSIS — M5010 Cervical disc disorder with radiculopathy, unspecified cervical region: Secondary | ICD-10-CM

## 2014-03-13 DIAGNOSIS — IMO0002 Reserved for concepts with insufficient information to code with codable children: Secondary | ICD-10-CM

## 2014-03-13 MED ORDER — DAPAGLIFLOZIN PROPANEDIOL 5 MG PO TABS: 5.0000 mg | ORAL_TABLET | Freq: Every day | ORAL | Status: DC

## 2014-03-13 MED ORDER — GLIPIZIDE ER 5 MG PO TB24: ORAL_TABLET | ORAL | Status: DC

## 2014-03-13 MED ORDER — AMLODIPINE BESYLATE 10 MG PO TABS: 10.0000 mg | ORAL_TABLET | Freq: Every day | ORAL | Status: DC

## 2014-03-13 MED ORDER — GABAPENTIN 300 MG PO CAPS: 300.0000 mg | ORAL_CAPSULE | Freq: Every day | ORAL | Status: DC

## 2014-03-13 MED ORDER — SPIRONOLACTONE 100 MG PO TABS: 100.0000 mg | ORAL_TABLET | Freq: Every day | ORAL | Status: DC

## 2014-03-13 MED ORDER — MONTELUKAST SODIUM 10 MG PO TABS: 10.0000 mg | ORAL_TABLET | Freq: Every day | ORAL | Status: DC

## 2014-03-13 MED ORDER — METFORMIN HCL 1000 MG PO TABS: ORAL_TABLET | ORAL | Status: DC

## 2014-03-13 MED ORDER — PRAVASTATIN SODIUM 40 MG PO TABS: ORAL_TABLET | ORAL | Status: DC

## 2014-03-13 MED ORDER — HYDROXYZINE HCL 25 MG PO TABS: ORAL_TABLET | ORAL | Status: DC

## 2014-04-07 ENCOUNTER — Other Ambulatory Visit: Payer: Self-pay

## 2014-04-28 ENCOUNTER — Telehealth: Payer: Self-pay

## 2014-04-28 MED ORDER — PROMETHAZINE-DM 6.25-15 MG/5ML PO SYRP
5.0000 mL | ORAL_SOLUTION | Freq: Every evening | ORAL | Status: DC | PRN
Start: 2014-04-28 — End: 2014-04-28

## 2014-04-28 MED ORDER — PROMETHAZINE-DM 6.25-15 MG/5ML PO SYRP: 5.0000 mL | ORAL_SOLUTION | Freq: Every evening | ORAL | Status: DC | PRN

## 2014-04-28 MED ORDER — PREDNISONE 5 MG PO TABS: 5.0000 mg | ORAL_TABLET | Freq: Two times a day (BID) | ORAL | Status: DC

## 2014-04-28 NOTE — Telephone Encounter (Signed)
Left message for patient- [per earlier discussion- no sinus drainage}

## 2014-04-28 NOTE — Telephone Encounter (Signed)
Has been coughing for 2 weeks, started out green but now its clear. Has been using robitussin dm and tessalon perles but still states shes "hacking her head off" and shes coughed so much she has pains in her back and head. Please advise if anything else can be given?

## 2014-04-28 NOTE — Telephone Encounter (Signed)
Prednisone 5 mg twice daily for 5 days, phenergan dm one teaspoon at bedtime as needed x 178ml no refill and proventil mdi 2 puffs every 6 hrs as needed x 1, pls send all in and let her know  Inquire if having nasal drainage and allergy symptoms if she is and has no nasal spray [pls send in flonase or nasonex 2 puffs once daily, whichever is preferred if she will use a spray and has none, or may use otc claritin or zyrtec one daily

## 2014-04-28 NOTE — Addendum Note (Signed)
Addended by: Eual Fines on: 04/28/2014 01:13 PM   Modules accepted: Orders, Medications

## 2014-05-04 ENCOUNTER — Emergency Department (HOSPITAL_COMMUNITY)
Admission: EM | Admit: 2014-05-04 | Discharge: 2014-05-04 | Disposition: A | Discharge status: Home / Self Care | Payer: Commercial Managed Care - HMO | Attending: Emergency Medicine | Admitting: Emergency Medicine

## 2014-05-04 ENCOUNTER — Emergency Department (HOSPITAL_COMMUNITY): Payer: Commercial Managed Care - HMO

## 2014-05-04 ENCOUNTER — Encounter (HOSPITAL_COMMUNITY): Payer: Self-pay | Admitting: Emergency Medicine

## 2014-05-04 DIAGNOSIS — J45909 Unspecified asthma, uncomplicated: Secondary | ICD-10-CM | POA: Insufficient documentation

## 2014-05-04 DIAGNOSIS — R05 Cough: Secondary | ICD-10-CM | POA: Diagnosis not present

## 2014-05-04 DIAGNOSIS — Z87891 Personal history of nicotine dependence: Secondary | ICD-10-CM | POA: Insufficient documentation

## 2014-05-04 DIAGNOSIS — E119 Type 2 diabetes mellitus without complications: Secondary | ICD-10-CM | POA: Diagnosis not present

## 2014-05-04 DIAGNOSIS — M1990 Unspecified osteoarthritis, unspecified site: Secondary | ICD-10-CM | POA: Insufficient documentation

## 2014-05-04 DIAGNOSIS — Z7982 Long term (current) use of aspirin: Secondary | ICD-10-CM | POA: Diagnosis not present

## 2014-05-04 DIAGNOSIS — I10 Essential (primary) hypertension: Secondary | ICD-10-CM | POA: Diagnosis not present

## 2014-05-04 DIAGNOSIS — Z7951 Long term (current) use of inhaled steroids: Secondary | ICD-10-CM | POA: Diagnosis not present

## 2014-05-04 DIAGNOSIS — Z8719 Personal history of other diseases of the digestive system: Secondary | ICD-10-CM | POA: Insufficient documentation

## 2014-05-04 DIAGNOSIS — Z91040 Latex allergy status: Secondary | ICD-10-CM | POA: Diagnosis not present

## 2014-05-04 DIAGNOSIS — Z7952 Long term (current) use of systemic steroids: Secondary | ICD-10-CM | POA: Diagnosis not present

## 2014-05-04 DIAGNOSIS — E669 Obesity, unspecified: Secondary | ICD-10-CM | POA: Insufficient documentation

## 2014-05-04 DIAGNOSIS — R053 Chronic cough: Secondary | ICD-10-CM

## 2014-05-04 DIAGNOSIS — Z79899 Other long term (current) drug therapy: Secondary | ICD-10-CM | POA: Diagnosis not present

## 2014-05-04 NOTE — ED Provider Notes (Addendum)
CSN: [id number]     Arrival date & time 05/04/14  1650 History   This chart was scribed for non-physician practitioner, Tiffany Jefferson, PA-C working with Tiffany Sorrow, MD, by Chester Holstein, ED Scribe. This patient was seen in room APFT23/APFT23 and the patient's care was started at 7:08 PM.    Chief Complaint  Patient presents with  . Cough     Patient is a 69 y.o. female presenting with cough. The history is provided by the patient. No language interpreter was used.  Cough Associated symptoms: no chest pain, no chills, no ear pain, no eye discharge, no fever, no rhinorrhea, no shortness of breath, no sore throat and no wheezing    HPI Comments: Tiffany Jacobs is a 69 y.o. female with PMHx of RAD, DM, HTN, GERD, sciatica, and asthma who presents to the Emergency Department complaining of dry cough for 3 weeks, worsening a week ago. Pt notes occasional production of sputum from cough. Pt notes associated tickle in her throat, dyspnea on exertion, and back pain and headache with cough with onset last week.  Pt states PCP, Dr. Moshe Cipro, called in a Rx for prednisone for her. Pt has also been taking Tessalon and Promethazine DM with no relief. Pt tried using her nebulizer with mild relief. Pt's CBG was 275 today. Pt denies wheezing and swelling in legs.  Past Medical History  Diagnosis Date  . Low back pain   . Obesity   . Seasonal allergies   . RAD (reactive airway disease)   . Diabetes mellitus, type 2 2004  . GERD (gastroesophageal reflux disease) 2004  . Hypertension 2004  . Sciatica   . Asthma 1963  . Arthritis    Past Surgical History  Procedure Laterality Date  . Total knee arthroplasty left  02-01-05    Dr. Aline Brochure  . Tubal ligation  1970  . Left breast biopsy for benign disease    . Shoulder open rotator cuff repair Right 11/09/2012    Procedure: ROTATOR CUFF REPAIR Right SHOULDER OPEN;  Surgeon: Carole Civil, MD;  Location: [facility];  Service: Orthopedics;   Laterality: Right;  . Shoulder acromioplasty Right 11/09/2012    Procedure: RIGHT SHOULDER ACROMIOPLASTY;  Surgeon: Carole Civil, MD;  Location: [facility];  Service: Orthopedics;  Laterality: Right;  . Acromio-clavicular joint repair Right 11/09/2012    Procedure: ACROMIO-CLAVICULAR JOINT REPAIR;  Surgeon: Carole Civil, MD;  Location: [facility];  Service: Orthopedics;  Laterality: Right;   Family History  Problem Relation Age of Onset  . Kidney failure Father   . Kidney disease Father   . Stroke Brother 25    used drugs , stroke and heart disease  . Diabetes Brother   . Hypertension Brother   . Asthma    . Lung disease    . Cancer    . Heart disease    . Arthritis    . Cancer Daughter 34    breast   . Diabetes Mother    History  Substance Use Topics  . Smoking status: Former Smoker -- 1.00 packs/day for 30 years    Types: Cigarettes  . Smokeless tobacco: Former Systems developer    Quit date: 11/02/1988  . Alcohol Use: No   OB History    Gravida Para Term Preterm AB TAB SAB Ectopic Multiple Living   7 7 7       6      Review of Systems  Constitutional: Negative for fever and chills.  HENT:  Negative for congestion, ear pain, rhinorrhea, sinus pressure, sore throat, trouble swallowing and voice change.   Eyes: Negative for discharge.  Respiratory: Positive for cough. Negative for shortness of breath, wheezing and stridor.   Cardiovascular: Negative for chest pain and leg swelling.  Gastrointestinal: Negative for abdominal pain.  Genitourinary: Negative.       Allergies  Ace inhibitors and Latex  Home Medications   Prior to Admission medications   Medication Sig Start Date End Date Taking? Authorizing Provider  albuterol (PROVENTIL HFA;VENTOLIN HFA) 108 (90 BASE) MCG/ACT inhaler Inhale 2 puffs into the lungs every 6 (six) hours as needed for wheezing or shortness of breath. 01/13/14  Yes Fayrene Helper, MD  albuterol (PROVENTIL) (2.5 MG/3ML) 0.083% nebulizer solution Take  2.5 mg by nebulization every 6 (six) hours as needed for wheezing or shortness of breath.   Yes Historical Provider, MD  amLODipine (NORVASC) 10 MG tablet Take 1 tablet (10 mg total) by mouth daily. 03/13/14  Yes Fayrene Helper, MD  aspirin (ASPIRIN LOW DOSE) 81 MG EC tablet Take 81 mg by mouth every morning.    Yes Historical Provider, MD  benzonatate (TESSALON) 100 MG capsule Take 100 mg by mouth 3 (three) times daily as needed for cough.   Yes Historical Provider, MD  budesonide-formoterol (SYMBICORT) 160-4.5 MCG/ACT inhaler Inhale 2 puffs into the lungs 2 (two) times daily. 03/28/13  Yes Fayrene Helper, MD  fluticasone (FLONASE) 50 MCG/ACT nasal spray Place 1 spray into both nostrils daily.   Yes Historical Provider, MD  gabapentin (NEURONTIN) 300 MG capsule Take 1 capsule (300 mg total) by mouth at bedtime. 03/13/14  Yes Fayrene Helper, MD  glipiZIDE (GLUCOTROL) 10 MG tablet Take 1 tablet (10 mg total) by mouth daily. 09/20/12  Yes Fayrene Helper, MD  hydrOXYzine (ATARAX/VISTARIL) 25 MG tablet TAKE ONE TABLET BY MOUTH AT NIGHT FOR ITCHING. 03/13/14  Yes Fayrene Helper, MD  ibuprofen (ADVIL,MOTRIN) 200 MG tablet Take 400 mg by mouth every 6 (six) hours as needed for mild pain.   Yes Historical Provider, MD  metFORMIN (GLUCOPHAGE) 1000 MG tablet TAKE (1) TABLET BY MOUTH TWICE DAILY WITH MEALS. 03/13/14  Yes Fayrene Helper, MD  montelukast (SINGULAIR) 10 MG tablet Take 1 tablet (10 mg total) by mouth at bedtime. 03/13/14  Yes Fayrene Helper, MD  pravastatin (PRAVACHOL) 40 MG tablet TAKE (1) TABLET BY MOUTH AT BEDTIME FOR CHOLESTEROL. 03/13/14  Yes Fayrene Helper, MD  predniSONE (DELTASONE) 5 MG tablet Take 1 tablet (5 mg total) by mouth 2 (two) times daily with a meal. 04/28/14  Yes Fayrene Helper, MD  PRESCRIPTION MEDICATION Take 0.25 tablets by mouth daily as needed (Energy tablet called 57. Patient takes 1/4 tablet as needed.).   Yes Historical Provider, MD   promethazine-dextromethorphan (PROMETHAZINE-DM) 6.25-15 MG/5ML syrup Take 5 mLs by mouth at bedtime as needed for cough. 04/28/14  Yes Fayrene Helper, MD  spironolactone (ALDACTONE) 100 MG tablet Take 1 tablet (100 mg total) by mouth daily. 03/13/14  Yes Fayrene Helper, MD  tetrahydrozoline 0.05 % ophthalmic solution Place 1 drop into both eyes daily as needed (Dry Eyes).   Yes Historical Provider, MD  zolpidem (AMBIEN) 10 MG tablet TAKE (1) TABLET BY MOUTH AT BEDTIME AS NEEDED FOR SLEEP. Patient taking differently: Take 5 mg by mouth every evening 02/24/14  Yes Fayrene Helper, MD  dapagliflozin propanediol (FARXIGA) 5 MG TABS tablet Take 5 mg by mouth daily. Patient not  taking: Reported on 05/04/2014 03/13/14   Fayrene Helper, MD  fluticasone [facility]) 50 MCG/ACT nasal spray Place 2 sprays into the nose daily.    Historical Provider, MD  glipiZIDE (GLUCOTROL XL) 5 MG 24 hr tablet One tablet once daily with breakfast Patient not taking: Reported on 05/04/2014 03/13/14   Fayrene Helper, MD  Lancets (ACCU-CHEK MULTICLIX) lancets USE AS DIRECTED TO TEST BLOOD SUGAR ONCE DAILY. Patient not taking: Reported on 05/04/2014 11/26/12   Fayrene Helper, MD   BP 131/51 mmHg  Pulse 112  Temp(Src) 98.5 F (36.9 C) (Oral)  Resp 20  Ht 5' 2.5" (1.588 m)  Wt 214 lb (97.07 kg)  BMI 38.49 kg/m2  SpO2 96% Physical Exam  Constitutional: She is oriented to person, place, and time. She appears well-developed and well-nourished. No distress.  HENT:  Head: Normocephalic and atraumatic.  Nose: Nose normal.  Mouth/Throat: No oropharyngeal exudate.  Eyes: Conjunctivae are normal.  Cardiovascular: Normal rate, regular rhythm, normal heart sounds and intact distal pulses.   No murmur heard. Pulmonary/Chest: Effort normal and breath sounds normal. No respiratory distress. She has no wheezes. She has no rhonchi. She has no rales. She exhibits no tenderness.  Musculoskeletal: She exhibits no edema or  tenderness.  Neurological: She is alert and oriented to person, place, and time.  Skin: Skin is warm and dry. No rash noted.  Psychiatric: She has a normal mood and affect. Her behavior is normal.  Vitals reviewed.   ED Course  Procedures (including critical care time) DIAGNOSTIC STUDIES: Oxygen Saturation is 96% on room air, normal by my interpretation.    COORDINATION OF CARE: 7:20 PM Discussed treatment plan with patient at beside, the patient agrees with the plan and has no further questions at this time.   Labs Review Labs Reviewed - No data to display  Imaging Review Dg Chest 2 View  05/04/2014   CLINICAL DATA:  Chronic cough.  EXAM: CHEST  2 VIEW  COMPARISON:  None.  FINDINGS: The heart size and mediastinal contours are within normal limits. Both lungs are clear. The visualized skeletal structures are unremarkable.  IMPRESSION: No active cardiopulmonary disease.   Electronically Signed   By: Rolm Baptise M.D.   On: 05/04/2014 18:18     EKG Interpretation None      MDM   Final diagnoses:  None    I personally performed the services described in this documentation, which was scribed in my presence. The recorded information has been reviewed and is accurate.    Medical screening examination/treatment/procedure(s) were conducted as a shared visit with non-physician practitioner(s) and myself.  I personally evaluated the patient during the encounter.  Patient seen by me. Patient followed by Dr. Moshe Cipro. Patient with 2 concerns concerned about having pneumonia since she's got some pain when she coughs. Patient had chronic cough or long period of time being followed by ear nose and throat and on several medications try to help this. Patient also concerned about top of her head with pain only when she coughs. No other neuro symptoms. Only thing I can think of this could represent perhaps pain induced to the increased intracranial pressure from the cough possibility of a subtle  brain tumor cannot be completely ruled out this does not sound like a head bleed. If symptoms were to persist over the next 7 days recommendation for MRI of brain is not unreasonable. MRI not available here tonight.   Patient originally seen by PA. I saw the  patient as a shared visit. My note as above.   EKG Interpretation None      Tiffany Sorrow, MD 05/04/14 2031  Tiffany Sorrow, MD 05/04/14 2033

## 2014-05-04 NOTE — ED Notes (Signed)
MD at bedside. 

## 2014-05-04 NOTE — ED Notes (Signed)
Pt states prednisone medication was called in by Dr. Samella Parr office on Friday and she is no better. Lung sounds are clear at this time. Pt states productive cough at times, last color noticed was green. Pt is in no distress and not actively coughing at this time. Pt is on Tessalon and Promethazine DM currently for cough.

## 2014-05-04 NOTE — Discharge Instructions (Signed)
Cough, Adult  A cough is a reflex that helps clear your throat and airways. It can help heal the body or may be a reaction to an irritated airway. A cough may only last 2 or 3 weeks (acute) or may last more than 8 weeks (chronic).  CAUSES Acute cough:  Viral or bacterial infections. Chronic cough:  Infections.  Allergies.  Asthma.  Post-nasal drip.  Smoking.  Heartburn or acid reflux.  Some medicines.  Chronic lung problems (COPD).  Cancer. SYMPTOMS   Cough.  Fever.  Chest pain.  Increased breathing rate.  High-pitched whistling sound when breathing (wheezing).  Colored mucus that you cough up (sputum). TREATMENT   A bacterial cough may be treated with antibiotic medicine.  A viral cough must run its course and will not respond to antibiotics.  Your caregiver may recommend other treatments if you have a chronic cough. HOME CARE INSTRUCTIONS   Only take over-the-counter or prescription medicines for pain, discomfort, or fever as directed by your caregiver. Use cough suppressants only as directed by your caregiver.  Use a cold steam vaporizer or humidifier in your bedroom or home to help loosen secretions.  Sleep in a semi-upright position if your cough is worse at night.  Rest as needed.  Stop smoking if you smoke. SEEK IMMEDIATE MEDICAL CARE IF:   You have pus in your sputum.  Your cough starts to worsen.  You cannot control your cough with suppressants and are losing sleep.  You begin coughing up blood.  You have difficulty breathing.  You develop pain which is getting worse or is uncontrolled with medicine.  You have a fever. MAKE SURE YOU:   Understand these instructions.  Will watch your condition.  Will get help right away if you are not doing well or get worse. Document Released: 08/20/2010 Document Revised: 05/16/2011 Document Reviewed: 08/20/2010 Exit[employer] Patient Information 2015 [employer]. This information is not intended  to replace advice given to you by your health care provider. Make sure you discuss any questions you have with your health care provider.  

## 2014-05-04 NOTE — ED Notes (Signed)
Pt reports cough that has been present for several months but has become worse since Fri. Pt has been seen by Dr. Benjamine Mola.

## 2014-05-07 ENCOUNTER — Other Ambulatory Visit: Payer: Self-pay | Admitting: Family Medicine

## 2014-05-07 DIAGNOSIS — Z1231 Encounter for screening mammogram for malignant neoplasm of breast: Secondary | ICD-10-CM

## 2014-05-08 ENCOUNTER — Ambulatory Visit (INDEPENDENT_AMBULATORY_CARE_PROVIDER_SITE_OTHER): Payer: Commercial Managed Care - HMO | Admitting: Otolaryngology

## 2014-05-08 DIAGNOSIS — J330 Polyp of nasal cavity: Secondary | ICD-10-CM

## 2014-05-08 DIAGNOSIS — J342 Deviated nasal septum: Secondary | ICD-10-CM

## 2014-05-08 DIAGNOSIS — J310 Chronic rhinitis: Secondary | ICD-10-CM

## 2014-05-13 ENCOUNTER — Telehealth: Payer: Self-pay | Admitting: Family Medicine

## 2014-05-13 NOTE — Telephone Encounter (Signed)
Called and left message reminding patient to have fasting blood work done this week.  Also notifying her that pcp is aware of ED visit.

## 2014-05-13 NOTE — Telephone Encounter (Signed)
Pt recently in ED Tho her appt is in April, needs all these labs this week pls   Fasting, but remind her to drink water, lipid, cmp and EGFR, HBA1c, TSH , microalb

## 2014-06-02 ENCOUNTER — Ambulatory Visit (HOSPITAL_COMMUNITY)
Admission: RE | Admit: 2014-06-02 | Discharge: 2014-06-02 | Disposition: A | Discharge status: Home / Self Care | Payer: Commercial Managed Care - HMO | Source: Ambulatory Visit | Attending: Family Medicine | Admitting: Family Medicine

## 2014-06-02 DIAGNOSIS — E119 Type 2 diabetes mellitus without complications: Secondary | ICD-10-CM | POA: Diagnosis not present

## 2014-06-02 DIAGNOSIS — E1165 Type 2 diabetes mellitus with hyperglycemia: Secondary | ICD-10-CM | POA: Diagnosis not present

## 2014-06-02 DIAGNOSIS — Z1231 Encounter for screening mammogram for malignant neoplasm of breast: Secondary | ICD-10-CM | POA: Diagnosis not present

## 2014-06-02 DIAGNOSIS — I10 Essential (primary) hypertension: Secondary | ICD-10-CM | POA: Diagnosis not present

## 2014-06-02 DIAGNOSIS — E785 Hyperlipidemia, unspecified: Secondary | ICD-10-CM | POA: Diagnosis not present

## 2014-06-02 LAB — COMPLETE METABOLIC PANEL WITH GFR
ALT: 23 U/L (ref 0–35)
AST: 23 U/L (ref 0–37)
Albumin: 4.2 g/dL (ref 3.5–5.2)
Alkaline Phosphatase: 91 U/L (ref 39–117)
BUN: 9 mg/dL (ref 6–23)
CHLORIDE: 100 meq/L (ref 96–112)
CO2: 30 mEq/L (ref 19–32)
Calcium: 9.7 mg/dL (ref 8.4–10.5)
Creat: 0.77 mg/dL (ref 0.50–1.10)
GFR, Est Non African American: 80 mL/min
Glucose, Bld: 255 mg/dL — ABNORMAL HIGH (ref 70–99)
Potassium: 4.5 mEq/L (ref 3.5–5.3)
SODIUM: 138 meq/L (ref 135–145)
Total Bilirubin: 0.6 mg/dL (ref 0.2–1.2)
Total Protein: 7.1 g/dL (ref 6.0–8.3)

## 2014-06-02 LAB — LIPID PANEL
CHOL/HDL RATIO: 2.8 ratio
Cholesterol: 142 mg/dL (ref 0–200)
HDL: 51 mg/dL (ref >=46)
LDL Cholesterol: 70 mg/dL (ref 0–99)
TRIGLYCERIDES: 106 mg/dL (ref <150)
VLDL: 21 mg/dL (ref 0–40)

## 2014-06-02 LAB — TSH: TSH: 3.261 u[IU]/mL (ref 0.350–4.500)

## 2014-06-02 LAB — HEMOGLOBIN A1C
Hgb A1c MFr Bld: 10.9 % — ABNORMAL HIGH (ref <5.7)
MEAN PLASMA GLUCOSE: 266 mg/dL — AB (ref <117)

## 2014-06-03 LAB — MICROALBUMIN / CREATININE URINE RATIO
Creatinine, Urine: 27.2 mg/dL
MICROALB/CREAT RATIO: 22.1 mg/g (ref 0.0–30.0)
Microalb, Ur: 0.6 mg/dL (ref <2.0)

## 2014-06-06 ENCOUNTER — Ambulatory Visit (INDEPENDENT_AMBULATORY_CARE_PROVIDER_SITE_OTHER): Payer: Commercial Managed Care - HMO | Admitting: Family Medicine

## 2014-06-06 ENCOUNTER — Encounter: Payer: Self-pay | Admitting: Family Medicine

## 2014-06-06 VITALS — BP 122/82 | HR 74 | Resp 16 | Ht 63.0 in | Wt 218.0 lb

## 2014-06-06 DIAGNOSIS — J45991 Cough variant asthma: Secondary | ICD-10-CM

## 2014-06-06 DIAGNOSIS — Z1211 Encounter for screening for malignant neoplasm of colon: Secondary | ICD-10-CM | POA: Diagnosis not present

## 2014-06-06 DIAGNOSIS — Z23 Encounter for immunization: Secondary | ICD-10-CM | POA: Diagnosis not present

## 2014-06-06 DIAGNOSIS — Z0000 Encounter for general adult medical examination without abnormal findings: Secondary | ICD-10-CM | POA: Insufficient documentation

## 2014-06-06 DIAGNOSIS — IMO0002 Reserved for concepts with insufficient information to code with codable children: Secondary | ICD-10-CM

## 2014-06-06 DIAGNOSIS — E1165 Type 2 diabetes mellitus with hyperglycemia: Secondary | ICD-10-CM

## 2014-06-06 LAB — POC HEMOCCULT BLD/STL (OFFICE/1-CARD/DIAGNOSTIC): Fecal Occult Blood, POC: NEGATIVE

## 2014-06-06 MED ORDER — CANAGLIFLOZIN 100 MG PO TABS: 100.0000 mg | ORAL_TABLET | Freq: Every day | ORAL | Status: DC

## 2014-06-06 MED ORDER — METFORMIN HCL 1000 MG PO TABS: 1000.0000 mg | ORAL_TABLET | Freq: Two times a day (BID) | ORAL | Status: DC

## 2014-06-06 MED ORDER — GLIPIZIDE ER 10 MG PO TB24: 10.0000 mg | ORAL_TABLET | Freq: Every day | ORAL | Status: DC

## 2014-06-06 NOTE — Assessment & Plan Note (Signed)
Uncontrolled and deteriorated , non compliant with medicatio  And diet, poor understanding Refer to diabetic ed

## 2014-06-06 NOTE — Patient Instructions (Addendum)
F/u in 3.5 month, call if you need me before  Prevnar today  Medications for diabetes are invokana 100mg  one daily Glipizide ER 10 mg One daily Metformin 100mg  one twice daily  Pls test blood sugar every day , write it down and call in to nurse with readings once weekly  Goal for fasting blood sugar ranges from 90 to 130 and 2 hours after any meal or at bedtime should be between 140 to 180.  You do qualify for diabetic shoes  Blood pressure and all labs are excellent BUT blood sugar out of control  Pls keep appt with diabetic educator, she will call to schedule an appointment  HBA1C, chem 7 and EGFRnon fast in 3.5 month s 1 week befor appt please  Script will be sent for nebulizing machine

## 2014-06-06 NOTE — Assessment & Plan Note (Signed)

## 2014-06-06 NOTE — Assessment & Plan Note (Signed)
Uncontrolled, needs neb machine and neb solution

## 2014-06-06 NOTE — Assessment & Plan Note (Signed)
After obtaining informed consent, the vaccine is  administered by LPN.  

## 2014-06-06 NOTE — Progress Notes (Signed)
   Subjective:    Patient ID: Tiffany Jacobs, female    DOB: 1946/02/02, 69 y.o.   MRN: [id number]  HPI Patient is in for annual physical  Exam. Recent labs are reviewed, all is excellent except the blood sugar is uncontrolled. Diabetes management is addressed in detain, nutrition counseling is done by nurse and referral is made. Medications are clarified and re sent to her pharmacy Weekly calls are to be made to nurse  BP 122/82 mmHg  Pulse 74  Resp 16  Ht 5\' 3"  (1.6 m)  Wt 218 lb (98.884 kg)  BMI 38.63 kg/m2  SpO2 96%  Pleasant obese female, alert and oriented x 3, in no cardio-pulmonary distress. Afebrile. HEENT No facial trauma or asymetry. Sinuses non tender.  Extra occullar muscles intact, pupils equally reactive to light. External ears normal, tympanic membranes clear. Oropharynx moist, no exudate, poor  Dentition.Upper plate and lower partial with dental caries Neck: supple, no adenopathy,JVD or thyromegaly.No bruits.  Chest: Clear to ascultation bilaterally.No crackles or wheezes. Non tender to palpation  Breast: No asymetry,no masses or lumps. No tenderness. No nipple discharge or inversion. No axillary or supraclavicular adenopathy  Cardiovascular system; Heart sounds normal,  S1 and  S2 ,no S3.  No murmur, or thrill. Apical beat not displaced Peripheral pulses normal.  Abdomen: Soft, non tender, no organomegaly or masses. No bruits. Bowel sounds normal. No guarding, tenderness or rebound.  Rectal:  Normal sphincter tone. No mass.No rectal masses.  Guaiac negative stool.  Pelvic: defered   Musculoskeletal exam: Decreased  ROM of lumbar spine, adequate in hips , shoulders and knees. No deformity ,swelling or crepitus noted. No muscle wasting or atrophy.   Neurologic: Cranial nerves 2 to 12 intact. Power, tone ,sensation and reflexes normal throughout. No disturbance in gait. No tremor.  Skin: Intact, no ulceration, erythema , callus on  both heels. Pigmentation normal throughout  Psych; Normal mood and affect. Judgement and concentration normal     Review of Systems See HPI     Objective:   Physical Exam  BP 122/82 mmHg  Pulse 74  Resp 16  Ht 5\' 3"  (1.6 m)  Wt 218 lb (98.884 kg)  BMI 38.63 kg/m2  SpO2 96% DM (diabetes mellitus), type 2, uncontrolled Uncontrolled and deteriorated , non compliant with medicatio  And diet, poor understanding Refer to diabetic ed    Annual physical exam Annual exam as documented. Counseling done  re healthy lifestyle involving commitment to 150 minutes exercise per week, heart healthy diet, and attaining healthy weight.The importance of adequate sleep also discussed. Regular seat belt use and home safety, is also discussed. Changes in health habits are decided on by the patient with goals and time frames  set for achieving them. Immunization and cancer screening needs are specifically addressed at this visit.    Need for vaccination with 13-polyvalent pneumococcal conjugate vaccine After obtaining informed consent, the vaccine is  administered by LPN.    COUGH VARIANT ASTHMA Uncontrolled, needs neb machine and neb solution   Special screening for malignant neoplasms, colon No rectal mass, heme negative stool          Assessment & Plan:

## 2014-06-06 NOTE — Assessment & Plan Note (Signed)
No rectal mass, heme negative stool 

## 2014-06-11 DIAGNOSIS — J45991 Cough variant asthma: Secondary | ICD-10-CM | POA: Diagnosis not present

## 2014-06-19 DIAGNOSIS — Z0100 Encounter for examination of eyes and vision without abnormal findings: Secondary | ICD-10-CM | POA: Diagnosis not present

## 2014-06-19 DIAGNOSIS — H2510 Age-related nuclear cataract, unspecified eye: Secondary | ICD-10-CM | POA: Diagnosis not present

## 2014-06-19 DIAGNOSIS — I10 Essential (primary) hypertension: Secondary | ICD-10-CM | POA: Diagnosis not present

## 2014-06-19 DIAGNOSIS — E109 Type 1 diabetes mellitus without complications: Secondary | ICD-10-CM | POA: Diagnosis not present

## 2014-06-19 LAB — HM DIABETES EYE EXAM

## 2014-07-02 DIAGNOSIS — R0902 Hypoxemia: Secondary | ICD-10-CM | POA: Diagnosis not present

## 2014-07-03 ENCOUNTER — Telehealth: Payer: Self-pay | Admitting: Family Medicine

## 2014-07-03 ENCOUNTER — Encounter: Payer: Self-pay | Admitting: Family Medicine

## 2014-07-03 DIAGNOSIS — G4734 Idiopathic sleep related nonobstructive alveolar hypoventilation: Secondary | ICD-10-CM | POA: Insufficient documentation

## 2014-07-03 NOTE — Telephone Encounter (Signed)
Please let pt know that her sleep study shows that she does have a fall in her oxygen level while sleeping which qualifies her for oxygen at night when asleep. Pls refer and set up supplemental oxygen for her , I have left a completed script for you, thanks

## 2014-07-04 NOTE — Telephone Encounter (Signed)
Will fax to [facility]

## 2014-07-07 DIAGNOSIS — G4734 Idiopathic sleep related nonobstructive alveolar hypoventilation: Secondary | ICD-10-CM | POA: Diagnosis not present

## 2014-07-11 DIAGNOSIS — J45991 Cough variant asthma: Secondary | ICD-10-CM | POA: Diagnosis not present

## 2014-07-16 ENCOUNTER — Telehealth: Payer: Self-pay

## 2014-07-16 ENCOUNTER — Other Ambulatory Visit: Payer: Self-pay

## 2014-07-16 MED ORDER — CANAGLIFLOZIN 300 MG PO TABS: 300.0000 mg | ORAL_TABLET | Freq: Every day | ORAL | Status: DC

## 2014-07-16 NOTE — Telephone Encounter (Signed)
Increase invokana to 300 mg daily, OK to take three  100 mg tabs till done, but pls a,lso send in new higher dose and d/c old dose,   For oxygen I recommend more time to get adjusted since it is EVIDENT her oxygen falls low when sleeping  Is she using CPAP if she has sleep apnea, if not needs to get that arranged allso!

## 2014-07-16 NOTE — Telephone Encounter (Signed)
Called patient and left message for them to return call at the office   

## 2014-07-16 NOTE — Telephone Encounter (Signed)
States she has used the oxygen at night and everytime she wakes up with a terrible headache that takes awhile to go away. Doesn't have it unless she uses the oxygen overnight. What does she need to do?  Also her blood sugars have came down a lot from 350+ to in the 200s but has not been able to get it under 200 fasting. She averages from 250 to 280 usually. Cannot afford insulin so she is just taking invokana 100 and glipizide 10. Does she need to increase her invokana? Please advise

## 2014-07-18 NOTE — Telephone Encounter (Signed)
Called patient and left message for them to return call at the office   

## 2014-07-23 ENCOUNTER — Encounter: Payer: Commercial Managed Care - HMO | Attending: "Endocrinology | Admitting: Nutrition

## 2014-07-23 ENCOUNTER — Telehealth: Payer: Self-pay | Admitting: Nutrition

## 2014-07-23 NOTE — Telephone Encounter (Signed)
Phone number not in working order. Tiffany Jacobs, RDN CDE

## 2014-07-24 NOTE — Telephone Encounter (Signed)
Called patient and left message for them to return call at the office   

## 2014-07-24 NOTE — Telephone Encounter (Signed)
Patient wants the oxygen to be picked up because when she uses it she gets the worst headache and also because she said her family refuses to stop smoking in the house and she is scared it will blow up. Doesn't want to use it anymore

## 2014-07-24 NOTE — Telephone Encounter (Signed)
Noted ex[plain this at her own risk as she needs it for her health, but pls send discontinue oxygen pt is refusing I will write and sign you send

## 2014-08-01 NOTE — Telephone Encounter (Signed)
Pt understands.

## 2014-08-13 ENCOUNTER — Telehealth: Payer: Self-pay

## 2014-08-18 NOTE — Telephone Encounter (Signed)
Noted  

## 2014-09-05 DIAGNOSIS — E1165 Type 2 diabetes mellitus with hyperglycemia: Secondary | ICD-10-CM | POA: Diagnosis not present

## 2014-09-05 DIAGNOSIS — E119 Type 2 diabetes mellitus without complications: Secondary | ICD-10-CM | POA: Diagnosis not present

## 2014-09-06 LAB — BASIC METABOLIC PANEL WITH GFR
BUN: 10 mg/dL (ref 6–23)
CO2: 28 meq/L (ref 19–32)
Calcium: 8.9 mg/dL (ref 8.4–10.5)
Chloride: 100 mEq/L (ref 96–112)
Creat: 0.75 mg/dL (ref 0.50–1.10)
GFR, Est African American: >89 mL/min
GFR, Est Non African American: 82 mL/min
Glucose, Bld: 266 mg/dL — ABNORMAL HIGH (ref 70–99)
Potassium: 4.3 mEq/L (ref 3.5–5.3)
Sodium: 142 mEq/L (ref 135–145)

## 2014-09-06 LAB — HEMOGLOBIN A1C
Hgb A1c MFr Bld: 11.4 % — ABNORMAL HIGH (ref <5.7)
MEAN PLASMA GLUCOSE: 280 mg/dL — AB (ref <117)

## 2014-09-23 ENCOUNTER — Other Ambulatory Visit: Payer: Self-pay

## 2014-09-23 DIAGNOSIS — IMO0002 Reserved for concepts with insufficient information to code with codable children: Secondary | ICD-10-CM

## 2014-09-23 DIAGNOSIS — E1165 Type 2 diabetes mellitus with hyperglycemia: Secondary | ICD-10-CM

## 2014-09-23 DIAGNOSIS — M5010 Cervical disc disorder with radiculopathy, unspecified cervical region: Secondary | ICD-10-CM

## 2014-09-23 DIAGNOSIS — I10 Essential (primary) hypertension: Secondary | ICD-10-CM

## 2014-09-23 MED ORDER — BUDESONIDE-FORMOTEROL FUMARATE 160-4.5 MCG/ACT IN AERO: 2.0000 | INHALATION_SPRAY | Freq: Two times a day (BID) | RESPIRATORY_TRACT | Status: DC

## 2014-09-23 MED ORDER — CANAGLIFLOZIN 300 MG PO TABS: 300.0000 mg | ORAL_TABLET | Freq: Every day | ORAL | Status: DC

## 2014-09-23 MED ORDER — GABAPENTIN 300 MG PO CAPS: 300.0000 mg | ORAL_CAPSULE | Freq: Every day | ORAL | Status: DC

## 2014-09-23 MED ORDER — PRAVASTATIN SODIUM 40 MG PO TABS: ORAL_TABLET | ORAL | Status: DC

## 2014-09-23 MED ORDER — SPIRONOLACTONE 100 MG PO TABS: 100.0000 mg | ORAL_TABLET | Freq: Every day | ORAL | Status: DC

## 2014-09-23 MED ORDER — MONTELUKAST SODIUM 10 MG PO TABS: 10.0000 mg | ORAL_TABLET | Freq: Every day | ORAL | Status: DC

## 2014-09-23 MED ORDER — GLIPIZIDE ER 10 MG PO TB24: 20.0000 mg | ORAL_TABLET | Freq: Every day | ORAL | Status: DC

## 2014-09-23 MED ORDER — AMLODIPINE BESYLATE 10 MG PO TABS: 10.0000 mg | ORAL_TABLET | Freq: Every day | ORAL | Status: DC

## 2014-09-23 MED ORDER — METFORMIN HCL 1000 MG PO TABS: 1000.0000 mg | ORAL_TABLET | Freq: Two times a day (BID) | ORAL | Status: DC

## 2014-09-23 NOTE — Addendum Note (Signed)
Addended by: Denman George B on: 09/23/2014 02:51 PM   Modules accepted: Orders

## 2014-10-07 ENCOUNTER — Telehealth: Payer: Self-pay | Admitting: *Deleted

## 2014-10-07 NOTE — Telephone Encounter (Signed)
Pt is requesting to speak to a nurse. Please advise

## 2014-10-07 NOTE — Telephone Encounter (Signed)
Doesn't want to see Endo because she doesn't have the copay. Will call in on fridays with numbers

## 2014-10-09 ENCOUNTER — Other Ambulatory Visit: Payer: Self-pay

## 2014-10-09 NOTE — Telephone Encounter (Signed)
Patient called to talk with Velna Hatchet and she also stated the number is not working that [place] gave her to the pharmacy. [phone]

## 2014-10-10 ENCOUNTER — Other Ambulatory Visit: Payer: Self-pay

## 2014-10-10 MED ORDER — CANAGLIFLOZIN 300 MG PO TABS: 300.0000 mg | ORAL_TABLET | Freq: Every day | ORAL | Status: DC

## 2014-10-10 MED ORDER — INSULIN NPH ISOPHANE & REGULAR (70-30) 100 UNIT/ML ~~LOC~~ SUSP: 5.0000 [IU] | Freq: Two times a day (BID) | SUBCUTANEOUS | Status: DC

## 2014-10-10 NOTE — Telephone Encounter (Signed)
Patient aware and will try to get insulin when her check comes next week

## 2014-10-10 NOTE — Telephone Encounter (Signed)
Insulin sent and left pt message to call back

## 2014-10-10 NOTE — Telephone Encounter (Signed)
Patient blood sugars are 204,141,188,182,180. Only takes metformin and glipizide 10mg  2 tabs daily. Cannot afford Invokana. I called it in to [facility] to see how much it would be and its $47. I will try to talk the patient into trying to get this med.

## 2014-10-10 NOTE — Telephone Encounter (Signed)
Will need to test and record 3 times daily, will need  Insulin mix   5 units twice daily, at breakfast and at supper/dinner. I will enter print and have her collect explain need to get at [facility] where it is OTC, if needs script  Mayer Masker 3 times daily test supplies pls use left here if I am not present  Goal for fasting blood sugar ranges from 90 to 130 and 2 hours after any meal or at bedtime should be between 140 to 170. ?? pls ask

## 2014-10-30 ENCOUNTER — Ambulatory Visit (INDEPENDENT_AMBULATORY_CARE_PROVIDER_SITE_OTHER): Payer: Commercial Managed Care - HMO | Admitting: Family Medicine

## 2014-10-30 ENCOUNTER — Encounter: Payer: Self-pay | Admitting: Family Medicine

## 2014-10-30 VITALS — BP 126/84 | HR 100 | Resp 18 | Ht 63.0 in | Wt 222.0 lb

## 2014-10-30 DIAGNOSIS — Z91199 Patient's noncompliance with other medical treatment and regimen due to unspecified reason: Secondary | ICD-10-CM

## 2014-10-30 DIAGNOSIS — IMO0001 Reserved for inherently not codable concepts without codable children: Secondary | ICD-10-CM

## 2014-10-30 DIAGNOSIS — F329 Major depressive disorder, single episode, unspecified: Secondary | ICD-10-CM | POA: Diagnosis not present

## 2014-10-30 DIAGNOSIS — Z9119 Patient's noncompliance with other medical treatment and regimen: Secondary | ICD-10-CM

## 2014-10-30 DIAGNOSIS — E119 Type 2 diabetes mellitus without complications: Secondary | ICD-10-CM

## 2014-10-30 DIAGNOSIS — J45991 Cough variant asthma: Secondary | ICD-10-CM

## 2014-10-30 DIAGNOSIS — E785 Hyperlipidemia, unspecified: Secondary | ICD-10-CM

## 2014-10-30 DIAGNOSIS — I10 Essential (primary) hypertension: Secondary | ICD-10-CM | POA: Diagnosis not present

## 2014-10-30 DIAGNOSIS — J309 Allergic rhinitis, unspecified: Secondary | ICD-10-CM

## 2014-10-30 DIAGNOSIS — F32A Depression, unspecified: Secondary | ICD-10-CM | POA: Insufficient documentation

## 2014-10-30 DIAGNOSIS — E669 Obesity, unspecified: Secondary | ICD-10-CM

## 2014-10-30 DIAGNOSIS — E1169 Type 2 diabetes mellitus with other specified complication: Secondary | ICD-10-CM

## 2014-10-30 DIAGNOSIS — Z1159 Encounter for screening for other viral diseases: Secondary | ICD-10-CM

## 2014-10-30 DIAGNOSIS — G4700 Insomnia, unspecified: Secondary | ICD-10-CM

## 2014-10-30 MED ORDER — SERTRALINE HCL 50 MG PO TABS: 50.0000 mg | ORAL_TABLET | Freq: Every day | ORAL | Status: DC

## 2014-10-30 MED ORDER — INSULIN NPH ISOPHANE & REGULAR (70-30) 100 UNIT/ML ~~LOC~~ SUSP: 5.0000 [IU] | Freq: Two times a day (BID) | SUBCUTANEOUS | Status: DC

## 2014-10-30 NOTE — Patient Instructions (Addendum)
F/u October 3 or after, call if you need me before  HBA1C, cmp and EGGFR, hep C, fasting lipid, cBC  Start insulin 5 units twice daily, continue the other medication you take for diabetes  Goal for fasting blood sugar ranges from 90 to 140 and 2 hours after any meal or at bedtime should be between 140 to 180.   New for energy and to help control the eating due to stress is zoloft one daily  Please work on good  health habits so that your health will improve. 1. Commitment to daily physical activity for 30 to 60  minutes, if you are able to do this.  2. Commitment to wise food choices. Aim for half of your  food intake to be vegetable and fruit, one quarter starchy foods, and one quarter protein. Try to eat on a regular schedule  3 meals per day, snacking between meals should be limited to vegetables or fruits or small portions of nuts. 64 ounces of water per day is generally recommended, unless you have specific health conditions, like heart failure or kidney failure where you will need to limit fluid intake.  3. Commitment to sufficient and a  good quality of physical and mental rest daily, generally between 6 to 8 hours per day.  WITH PERSISTANCE AND PERSEVERANCE, THE IMPOSSIBLE , BECOMES THE NORM!  Thanks for choosing [facility], we consider it a privelige to serve you.

## 2014-11-03 NOTE — Assessment & Plan Note (Signed)
Tiffany Jacobs is reminded of the importance of commitment to daily physical activity for 30 minutes or more, as able and the need to limit carbohydrate intake to 30 to 60 grams per meal to help with blood sugar control.   The need to take medication as prescribed, test blood sugar as directed, and to call between visits if there is a concern that blood sugar is uncontrolled is also discussed.   Tiffany Jacobs is reminded of the importance of daily foot exam, annual eye examination, and good blood sugar, blood pressure and cholesterol control. Markedly uncontrolled states she will buy the insulin that has been recommended in the past Updated lab needed at/ before next visit.   Diabetic Labs Latest Ref Rng 09/05/2014 06/02/2014 01/15/2014 10/09/2013 08/02/2013  HbA1c <5.7 % 11.4(H) 10.9(H) 7.5(H) 8.3(H) 7.4(H)  Microalbumin <2.0 mg/dL - 0.6 - - -  Micro/Creat Ratio 0.0 - 30.0 mg/g - 22.1 - - -  Chol 0 - 200 mg/dL - 142 - 132 -  HDL >=46 mg/dL - 51 - 53 -  Calc LDL 0 - 99 mg/dL - 70 - 66 -  Triglycerides <150 mg/dL - 106 - 67 -  Creatinine 0.50 - 1.10 mg/dL 0.75 0.77 0.72 0.72 0.72   BP/Weight 10/30/2014 06/06/2014 05/04/2014 01/24/2014 12/17/2013 11/04/2013 7/86/7672  Systolic BP 094 709 628 366 294 765 465  Diastolic BP 84 82 51 74 78 74 72  Wt. (Lbs) 222 218 214 217.12 214.04 214.04 212  BMI 39.34 38.63 38.49 38.47 38.5 38.5 38.77   Foot/eye exam completion dates Latest Ref Rng 06/06/2014 05/30/2013  Eye Exam No Retinopathy - -  Foot exam Order - - -  Foot Form Completion - Done Done

## 2014-11-03 NOTE — Assessment & Plan Note (Signed)
Not suicidal or homicidal, [primarily related to poor marriage Started  On medication, she will rely on her faith to help her  Also Unable to afford therapy

## 2014-11-06 ENCOUNTER — Ambulatory Visit (INDEPENDENT_AMBULATORY_CARE_PROVIDER_SITE_OTHER): Payer: Commercial Managed Care - HMO | Admitting: Otolaryngology

## 2014-11-14 ENCOUNTER — Other Ambulatory Visit: Payer: Self-pay

## 2014-11-14 MED ORDER — ONETOUCH DELICA LANCETS 33G MISC: Status: DC

## 2014-11-14 MED ORDER — GLUCOSE BLOOD VI STRP: ORAL_STRIP | Status: DC

## 2014-12-07 NOTE — Assessment & Plan Note (Signed)
Major concern with regard to pt's uncontrolled diabetes and her inability to afford needed medication, states that she will obtain medication and work at this. Though needs endo management , she again refuses , primarily on financial grounds
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
## 2014-12-07 NOTE — Assessment & Plan Note (Signed)
Controlled, no change in medication  

## 2014-12-07 NOTE — Assessment & Plan Note (Signed)
Deteriorated. Patient re-educated about  the importance of commitment to a  minimum of 150 minutes of exercise per week.  The importance of healthy food choices with portion control discussed. Encouraged to start a food diary, count calories and to consider  joining a support group. Sample diet sheets offered. Goals set by the patient for the next several months.   Weight /BMI 10/30/2014 06/06/2014 05/04/2014  WEIGHT 222 lb 218 lb 214 lb  HEIGHT 5\' 3"  5\' 3"  5' 2.5"  BMI 39.34 kg/m2 38.63 kg/m2 38.49 kg/m2    Current exercise per week 90 minutes.Overeating due to unrtreated deopression, and no exercise commitment

## 2014-12-07 NOTE — Assessment & Plan Note (Signed)
Hyperlipidemia:Low fat diet discussed and encouraged.   Lipid Panel  Lab Results  Component Value Date   CHOL 142 06/02/2014   HDL 51 06/02/2014   LDLCALC 70 06/02/2014   TRIG 106 06/02/2014   CHOLHDL 2.8 06/02/2014   Controlled, no change in medication Updated lab needed at/ before next visit.

## 2014-12-07 NOTE — Assessment & Plan Note (Signed)
Controlled, no change in medication DASH diet and commitment to daily physical activity for a minimum of 30 minutes discussed and encouraged, as a part of hypertension management. The importance of attaining a healthy weight is also discussed.  BP/Weight 10/30/2014 06/06/2014 05/04/2014 01/24/2014 12/17/2013 11/04/2013 1/32/4401  Systolic BP 027 253 664 403 474 259 563  Diastolic BP 84 82 51 74 78 74 72  Wt. (Lbs) 222 218 214 217.12 214.04 214.04 212  BMI 39.34 38.63 38.49 38.47 38.5 38.5 38.77

## 2014-12-07 NOTE — Assessment & Plan Note (Signed)
Sleep hygiene reviewed and written information offered also. Prescription sent for  medication needed.  

## 2014-12-07 NOTE — Assessment & Plan Note (Signed)
Worsened , due in part to worsening blood sugar control, I explained this to pt and encouraged her to be more consitent with diet and exercise plan

## 2014-12-11 ENCOUNTER — Other Ambulatory Visit: Payer: Self-pay

## 2014-12-11 DIAGNOSIS — E1165 Type 2 diabetes mellitus with hyperglycemia: Secondary | ICD-10-CM

## 2014-12-11 DIAGNOSIS — I10 Essential (primary) hypertension: Secondary | ICD-10-CM

## 2014-12-11 DIAGNOSIS — F32A Depression, unspecified: Secondary | ICD-10-CM

## 2014-12-11 DIAGNOSIS — E118 Type 2 diabetes mellitus with unspecified complications: Secondary | ICD-10-CM

## 2014-12-11 DIAGNOSIS — M5010 Cervical disc disorder with radiculopathy, unspecified cervical region: Secondary | ICD-10-CM

## 2014-12-11 DIAGNOSIS — F329 Major depressive disorder, single episode, unspecified: Secondary | ICD-10-CM

## 2014-12-11 MED ORDER — METFORMIN HCL 1000 MG PO TABS: 1000.0000 mg | ORAL_TABLET | Freq: Two times a day (BID) | ORAL | Status: DC

## 2014-12-11 MED ORDER — AMLODIPINE BESYLATE 10 MG PO TABS: 10.0000 mg | ORAL_TABLET | Freq: Every day | ORAL | Status: DC

## 2014-12-11 MED ORDER — MONTELUKAST SODIUM 10 MG PO TABS: 10.0000 mg | ORAL_TABLET | Freq: Every day | ORAL | Status: DC

## 2014-12-11 MED ORDER — ONETOUCH DELICA LANCETS 33G MISC: Status: DC

## 2014-12-11 MED ORDER — GABAPENTIN 300 MG PO CAPS: 300.0000 mg | ORAL_CAPSULE | Freq: Every day | ORAL | Status: DC

## 2014-12-11 MED ORDER — GLIPIZIDE ER 10 MG PO TB24: 20.0000 mg | ORAL_TABLET | Freq: Every day | ORAL | Status: DC

## 2014-12-11 MED ORDER — PRAVASTATIN SODIUM 40 MG PO TABS: ORAL_TABLET | ORAL | Status: DC

## 2014-12-11 MED ORDER — GLUCOSE BLOOD VI STRP: ORAL_STRIP | Status: DC

## 2014-12-11 MED ORDER — SERTRALINE HCL 50 MG PO TABS: 50.0000 mg | ORAL_TABLET | Freq: Every day | ORAL | Status: DC

## 2014-12-11 MED ORDER — SPIRONOLACTONE 100 MG PO TABS: 100.0000 mg | ORAL_TABLET | Freq: Every day | ORAL | Status: DC

## 2014-12-11 MED ORDER — ZOLPIDEM TARTRATE 10 MG PO TABS: ORAL_TABLET | ORAL | Status: DC

## 2014-12-25 ENCOUNTER — Encounter: Payer: Self-pay | Admitting: Family Medicine

## 2014-12-25 ENCOUNTER — Ambulatory Visit (INDEPENDENT_AMBULATORY_CARE_PROVIDER_SITE_OTHER): Payer: Commercial Managed Care - HMO | Admitting: Family Medicine

## 2014-12-25 VITALS — BP 122/74 | HR 100 | Resp 16 | Ht 63.0 in | Wt 221.0 lb

## 2014-12-25 DIAGNOSIS — Z23 Encounter for immunization: Secondary | ICD-10-CM | POA: Diagnosis not present

## 2014-12-25 DIAGNOSIS — E119 Type 2 diabetes mellitus without complications: Secondary | ICD-10-CM

## 2014-12-25 DIAGNOSIS — F329 Major depressive disorder, single episode, unspecified: Secondary | ICD-10-CM | POA: Diagnosis not present

## 2014-12-25 DIAGNOSIS — I10 Essential (primary) hypertension: Secondary | ICD-10-CM

## 2014-12-25 DIAGNOSIS — F32A Depression, unspecified: Secondary | ICD-10-CM

## 2014-12-25 DIAGNOSIS — Z1159 Encounter for screening for other viral diseases: Secondary | ICD-10-CM

## 2014-12-25 DIAGNOSIS — J45991 Cough variant asthma: Secondary | ICD-10-CM

## 2014-12-25 DIAGNOSIS — E669 Obesity, unspecified: Secondary | ICD-10-CM

## 2014-12-25 DIAGNOSIS — E785 Hyperlipidemia, unspecified: Secondary | ICD-10-CM

## 2014-12-25 DIAGNOSIS — IMO0001 Reserved for inherently not codable concepts without codable children: Secondary | ICD-10-CM

## 2014-12-25 DIAGNOSIS — E1169 Type 2 diabetes mellitus with other specified complication: Secondary | ICD-10-CM

## 2014-12-25 DIAGNOSIS — G4700 Insomnia, unspecified: Secondary | ICD-10-CM

## 2014-12-25 NOTE — Patient Instructions (Signed)
aNNUAL Bailey, call if you need me before  Flu vaccine todAY  tHANKFUL Bancroft  fASTING LIPID, CMO AND egfr, hba1c, hEP c this month Please  Please work on good  health habits so that your health will improve. 1. Commitment to daily physical activity for 30 to 60  minutes, if you are able to do this.  2. Commitment to wise food choices. Aim for half of your  food intake to be vegetable and fruit, one quarter starchy foods, and one quarter protein. Try to eat on a regular schedule  3 meals per day, snacking between meals should be limited to vegetables or fruits or small portions of nuts. 64 ounces of water per day is generally recommended, unless you have specific health conditions, like heart failure or kidney failure where you will need to limit fluid intake.  3. Commitment to sufficient and a  good quality of physical and mental rest daily, generally between 6 to 8 hours per day.  WITH PERSISTANCE AND PERSEVERANCE, THE IMPOSSIBLE , BECOMES THE NORM!   Thanks for choosing [facility], we consider it a privelige to serve you.

## 2014-12-25 NOTE — Assessment & Plan Note (Signed)
Marked improvement with zoloft, continue same dose, score is 1 today Commit to regular physical activity

## 2014-12-27 NOTE — Assessment & Plan Note (Signed)
Deteriorated. Patient re-educated about  the importance of commitment to a  minimum of 150 minutes of exercise per week.  The importance of healthy food choices with portion control discussed. Encouraged to start a food diary, count calories and to consider  joining a support group. Sample diet sheets offered. Goals set by the patient for the next several months.   Weight /BMI 12/25/2014 10/30/2014 06/06/2014  WEIGHT 221 lb 222 lb 218 lb  HEIGHT 5\' 3"  5\' 3"  5\' 3"   BMI 39.16 kg/m2 39.34 kg/m2 38.63 kg/m2    Current exercise per week 90 minutes.

## 2014-12-27 NOTE — Assessment & Plan Note (Signed)
Hyperlipidemia:Low fat diet discussed and encouraged.   Lipid Panel  Lab Results  Component Value Date   CHOL 142 06/02/2014   HDL 51 06/02/2014   LDLCALC 70 06/02/2014   TRIG 106 06/02/2014   CHOLHDL 2.8 06/02/2014  Updated lab needed at/ before next visit.

## 2014-12-27 NOTE — Assessment & Plan Note (Signed)
Controlled, no change in medication  

## 2014-12-27 NOTE — Progress Notes (Signed)
Tiffany Jacobs     MRN: [id number]      DOB: 1946/01/12   HPI Tiffany Jacobs is here for follow up and re-evaluation of chronic medical conditions, medication management and review of any available recent lab and radiology data.  Preventive health is updated, specifically  Cancer screening and Immunization.   Questions or concerns regarding consultations or procedures which the PT has had in the interim are  addressed. The PT denies any adverse reactions to current medications since the last visit. Reports marked improvement in depression on zoloft,a nd denies any adverse s/e . PHQ 9 score is 2 at today's visit. Blood sugar generally 200 in the morning before eating, has still not started insulin due to finances, will try to solicit help with medications Denies polyuria, polydipsia, blurred vision , or hypoglycemic episodes.  There are no new concerns.  There are no specific complaints   ROS Denies recent fever or chills. Denies sinus pressure, nasal congestion, ear pain or sore throat. Denies chest congestion, productive cough or wheezing. Denies chest pains, palpitations and leg swelling Denies abdominal pain, nausea, vomiting,diarrhea or constipation.   Denies dysuria, frequency, hesitancy or incontinence. Denies joint pain, swelling and limitation in mobility. Denies headaches, seizures, numbness, or tingling. Denies depression, anxiety or insomnia. Denies skin break down or rash.   PE  BP 122/74 mmHg  Pulse 100  Resp 16  Ht 5\' 3"  (1.6 m)  Wt 221 lb (100.245 kg)  BMI 39.16 kg/m2  SpO2 93%  Patient alert and oriented and in no cardiopulmonary distress.  HEENT: No facial asymmetry, EOMI,   oropharynx pink and moist.  Neck supple no JVD, no mass.  Chest: Clear to auscultation bilaterally.  CVS: S1, S2 no murmurs, no S3.Regular rate.  ABD: Soft non tender.   Ext: No edema  MS: Adequate though reduced  ROM spine, shoulders, hips and knees.  Skin: Intact, no  ulcerations or rash noted.  Psych: Good eye contact, normal affect. Memory intact not anxious or depressed appearing.  CNS: CN 2-12 intact, power,  normal throughout.no focal deficits noted.   Assessment & Plan  Depression Marked improvement with zoloft, continue same dose, score is 1 today Commit to regular physical activity  Insomnia Sleep hygiene reviewed and written information offered also. Prescription sent for  medication needed.   Essential hypertension Controlled, no change in medication DASH diet and commitment to daily physical activity for a minimum of 30 minutes discussed and encouraged, as a part of hypertension management. The importance of attaining a healthy weight is also discussed.  BP/Weight 12/25/2014 10/30/2014 06/06/2014 05/04/2014 01/24/2014 12/17/2013 0/86/5784  Systolic BP 696 295 284 132 440 102 725  Diastolic BP 74 84 82 51 74 78 74  Wt. (Lbs) 221 222 218 214 217.12 214.04 214.04  BMI 39.16 39.34 38.63 38.49 38.47 38.5 38.5        COUGH VARIANT ASTHMA Controlled, no change in medication   Hyperlipidemia Hyperlipidemia:Low fat diet discussed and encouraged.   Lipid Panel  Lab Results  Component Value Date   CHOL 142 06/02/2014   HDL 51 06/02/2014   LDLCALC 70 06/02/2014   TRIG 106 06/02/2014   CHOLHDL 2.8 06/02/2014  Updated lab needed at/ before next visit.       Obesity, Class II, BMI 35.0-39.9, with comorbidity (see actual BMI) Deteriorated. Patient re-educated about  the importance of commitment to a  minimum of 150 minutes of exercise per week.  The importance of healthy food choices  with portion control discussed. Encouraged to start a food diary, count calories and to consider  joining a support group. Sample diet sheets offered. Goals set by the patient for the next several months.   Weight /BMI 12/25/2014 10/30/2014 06/06/2014  WEIGHT 221 lb 222 lb 218 lb  HEIGHT 5\' 3"  5\' 3"  5\' 3"   BMI 39.16 kg/m2 39.34 kg/m2 38.63 kg/m2     Current exercise per week 90 minutes.

## 2014-12-27 NOTE — Assessment & Plan Note (Signed)
Controlled, no change in medication DASH diet and commitment to daily physical activity for a minimum of 30 minutes discussed and encouraged, as a part of hypertension management. The importance of attaining a healthy weight is also discussed.  BP/Weight 12/25/2014 10/30/2014 06/06/2014 05/04/2014 01/24/2014 12/17/2013 6/81/5947  Systolic BP 076 151 834 373 578 978 478  Diastolic BP 74 84 82 51 74 78 74  Wt. (Lbs) 221 222 218 214 217.12 214.04 214.04  BMI 39.16 39.34 38.63 38.49 38.47 38.5 38.5

## 2014-12-27 NOTE — Assessment & Plan Note (Signed)
Sleep hygiene reviewed and written information offered also. Prescription sent for  medication needed.  

## 2014-12-29 ENCOUNTER — Telehealth: Payer: Self-pay | Admitting: Family Medicine

## 2014-12-29 ENCOUNTER — Other Ambulatory Visit: Payer: Self-pay | Admitting: Family Medicine

## 2014-12-29 MED ORDER — INSULIN DETEMIR 100 UNIT/ML FLEXPEN: 25.0000 [IU] | PEN_INJECTOR | Freq: Every day | SUBCUTANEOUS | Status: DC

## 2014-12-29 NOTE — Telephone Encounter (Signed)
Script printed for 25 units levemir daily.Pls complete form, thanks She is to start with 10 units daily, and increase after review with you as needed, by 5 units each week, ENSURE sheunderstands  ?? pls ask

## 2015-01-01 NOTE — Telephone Encounter (Signed)
Tried x 3 times. Cannot get phone to go through

## 2015-01-08 NOTE — Telephone Encounter (Signed)
Called patient and left message for them to return call at the office   

## 2015-01-13 NOTE — Telephone Encounter (Signed)
Patient mailed back info and will fax it in to see if approved

## 2015-04-09 ENCOUNTER — Other Ambulatory Visit: Payer: Self-pay | Admitting: Family Medicine

## 2015-04-09 DIAGNOSIS — Z1231 Encounter for screening mammogram for malignant neoplasm of breast: Secondary | ICD-10-CM

## 2015-04-28 ENCOUNTER — Encounter: Payer: Commercial Managed Care - HMO | Admitting: Family Medicine

## 2015-05-19 ENCOUNTER — Emergency Department (HOSPITAL_COMMUNITY)
Admission: EM | Admit: 2015-05-19 | Discharge: 2015-05-19 | Disposition: A | Discharge status: Home / Self Care | Payer: Commercial Managed Care - HMO | Attending: Emergency Medicine | Admitting: Emergency Medicine

## 2015-05-19 ENCOUNTER — Emergency Department (HOSPITAL_COMMUNITY): Payer: Commercial Managed Care - HMO

## 2015-05-19 ENCOUNTER — Telehealth: Payer: Self-pay | Admitting: Family Medicine

## 2015-05-19 ENCOUNTER — Encounter (HOSPITAL_COMMUNITY): Payer: Self-pay

## 2015-05-19 DIAGNOSIS — R0602 Shortness of breath: Secondary | ICD-10-CM | POA: Diagnosis not present

## 2015-05-19 DIAGNOSIS — R509 Fever, unspecified: Secondary | ICD-10-CM | POA: Diagnosis not present

## 2015-05-19 DIAGNOSIS — J209 Acute bronchitis, unspecified: Secondary | ICD-10-CM | POA: Diagnosis not present

## 2015-05-19 DIAGNOSIS — E119 Type 2 diabetes mellitus without complications: Secondary | ICD-10-CM | POA: Insufficient documentation

## 2015-05-19 DIAGNOSIS — Z791 Long term (current) use of non-steroidal anti-inflammatories (NSAID): Secondary | ICD-10-CM | POA: Insufficient documentation

## 2015-05-19 DIAGNOSIS — Z7984 Long term (current) use of oral hypoglycemic drugs: Secondary | ICD-10-CM | POA: Diagnosis not present

## 2015-05-19 DIAGNOSIS — Z79899 Other long term (current) drug therapy: Secondary | ICD-10-CM | POA: Diagnosis not present

## 2015-05-19 DIAGNOSIS — R05 Cough: Secondary | ICD-10-CM | POA: Diagnosis not present

## 2015-05-19 DIAGNOSIS — I10 Essential (primary) hypertension: Secondary | ICD-10-CM | POA: Diagnosis not present

## 2015-05-19 DIAGNOSIS — E669 Obesity, unspecified: Secondary | ICD-10-CM | POA: Diagnosis not present

## 2015-05-19 DIAGNOSIS — J45909 Unspecified asthma, uncomplicated: Secondary | ICD-10-CM | POA: Insufficient documentation

## 2015-05-19 DIAGNOSIS — Z7982 Long term (current) use of aspirin: Secondary | ICD-10-CM | POA: Diagnosis not present

## 2015-05-19 DIAGNOSIS — Z87891 Personal history of nicotine dependence: Secondary | ICD-10-CM | POA: Insufficient documentation

## 2015-05-19 LAB — CBC WITH DIFFERENTIAL/PLATELET
BASOS PCT: 0 %
Basophils Absolute: 0 10*3/uL (ref 0.0–0.1)
EOS ABS: 0.3 10*3/uL (ref 0.0–0.7)
EOS PCT: 2 %
HCT: 37.2 % (ref 36.0–46.0)
HEMOGLOBIN: 12.2 g/dL (ref 12.0–15.0)
LYMPHS ABS: 1.9 10*3/uL (ref 0.7–4.0)
Lymphocytes Relative: 10 %
MCH: 27.6 pg (ref 26.0–34.0)
MCHC: 32.8 g/dL (ref 30.0–36.0)
MCV: 84.2 fL (ref 78.0–100.0)
MONO ABS: 1.8 10*3/uL — AB (ref 0.1–1.0)
MONOS PCT: 10 %
NEUTROS PCT: 78 %
Neutro Abs: 14.5 10*3/uL — ABNORMAL HIGH (ref 1.7–7.7)
PLATELETS: 344 10*3/uL (ref 150–400)
RBC: 4.42 MIL/uL (ref 3.87–5.11)
RDW: 14.2 % (ref 11.5–15.5)
WBC: 18.5 10*3/uL — ABNORMAL HIGH (ref 4.0–10.5)

## 2015-05-19 LAB — BASIC METABOLIC PANEL
ANION GAP: 7 (ref 5–15)
BUN: 7 mg/dL (ref 6–20)
CALCIUM: 8.9 mg/dL (ref 8.9–10.3)
CO2: 27 mmol/L (ref 22–32)
CREATININE: 0.77 mg/dL (ref 0.44–1.00)
Chloride: 102 mmol/L (ref 101–111)
GFR calc Af Amer: >60 mL/min (ref >60)
GFR calc non Af Amer: >60 mL/min (ref >60)
Glucose, Bld: 125 mg/dL — ABNORMAL HIGH (ref 65–99)
POTASSIUM: 3.8 mmol/L (ref 3.5–5.1)
SODIUM: 136 mmol/L (ref 135–145)

## 2015-05-19 LAB — D-DIMER, QUANTITATIVE (NOT AT ARMC): D DIMER QUANT: 0.58 ug{FEU}/mL — AB (ref 0.00–0.50)

## 2015-05-19 LAB — CBG MONITORING, ED: Glucose-Capillary: 138 mg/dL — ABNORMAL HIGH (ref 65–99)

## 2015-05-19 LAB — TROPONIN I

## 2015-05-19 MED ORDER — IPRATROPIUM BROMIDE 0.02 % IN SOLN: 0.5000 mg | Freq: Once | RESPIRATORY_TRACT | Status: DC

## 2015-05-19 MED ORDER — ACETAMINOPHEN 325 MG PO TABS
650.0000 mg | ORAL_TABLET | Freq: Once | ORAL | Status: AC
  Administered 2015-05-19: 650 mg via ORAL
  Filled 2015-05-19: qty 2

## 2015-05-19 MED ORDER — PREDNISONE 50 MG PO TABS
60.0000 mg | ORAL_TABLET | Freq: Once | ORAL | Status: AC
  Administered 2015-05-19: 60 mg via ORAL
  Filled 2015-05-19: qty 1

## 2015-05-19 MED ORDER — ALBUTEROL SULFATE (2.5 MG/3ML) 0.083% IN NEBU: 5.0000 mg | INHALATION_SOLUTION | Freq: Once | RESPIRATORY_TRACT | Status: DC

## 2015-05-19 MED ORDER — ALBUTEROL SULFATE (2.5 MG/3ML) 0.083% IN NEBU
2.5000 mg | INHALATION_SOLUTION | Freq: Once | RESPIRATORY_TRACT | Status: AC
  Administered 2015-05-19: 2.5 mg via RESPIRATORY_TRACT
  Filled 2015-05-19: qty 3

## 2015-05-19 MED ORDER — PREDNISONE 20 MG PO TABS: 20.0000 mg | ORAL_TABLET | Freq: Two times a day (BID) | ORAL | Status: DC

## 2015-05-19 MED ORDER — IPRATROPIUM-ALBUTEROL 0.5-2.5 (3) MG/3ML IN SOLN
3.0000 mL | Freq: Once | RESPIRATORY_TRACT | Status: AC
  Administered 2015-05-19: 3 mL via RESPIRATORY_TRACT
  Filled 2015-05-19: qty 3

## 2015-05-19 NOTE — ED Notes (Signed)
Patient c/o cough with fever.

## 2015-05-19 NOTE — Discharge Instructions (Signed)

## 2015-05-19 NOTE — Telephone Encounter (Signed)
I agree with your clinical judgement, her last hBA1C was 11 in JUlY!!!\ Needs Ov in am, will get Rocephin etc also labs , does not need to fast for the labs. If unable to wait obver nigth then Ed tonight

## 2015-05-19 NOTE — Telephone Encounter (Signed)
Patient is stating she is needing something for her persistent cough, she is coughing so hard sometimes she states she almost looses her breath, she is asking for a diabetic cough medication. She wants it sent to her mail order pharmacy

## 2015-05-19 NOTE — ED Provider Notes (Signed)
CSN: [id number]     Arrival date & time 05/19/15  1906 History   First MD Initiated Contact with Patient 05/19/15 2124     Chief Complaint  Patient presents with  . Cough     (Consider location/radiation/quality/duration/timing/severity/associated sxs/prior Treatment) HPI   Tiffany Jacobs is a 70 y.o. female here for evaluation of concern for ongoing cough which is nonproductive, low-grade fever, soreness, chest and upper abdomen from coughing, with symptoms progressively improving, however, persistent for 10 days. She started getting sick last week that time she had episodes of coughing that lasted for 10-15 seconds, and left her breathlessness. She denies leg swelling, orthopnea, chest pain, diaphoresis, or recurrent fever. She did have fever which initially began to be sick. However, it resolved after a couple of days. No prior similar problems. There are no other known modifying factors.   Past Medical History  Diagnosis Date  . Low back pain   . Obesity   . Seasonal allergies   . RAD (reactive airway disease)   . Diabetes mellitus, type 2 ([place]) 2004  . GERD (gastroesophageal reflux disease) 2004  . Hypertension 2004  . Sciatica   . Asthma 1963  . Arthritis    Past Surgical History  Procedure Laterality Date  . Total knee arthroplasty left  02-01-05    Dr. Aline Brochure  . Tubal ligation  1970  . Left breast biopsy for benign disease    . Shoulder open rotator cuff repair Right 11/09/2012    Procedure: ROTATOR CUFF REPAIR Right SHOULDER OPEN;  Surgeon: Carole Civil, MD;  Location: [facility];  Service: Orthopedics;  Laterality: Right;  . Shoulder acromioplasty Right 11/09/2012    Procedure: RIGHT SHOULDER ACROMIOPLASTY;  Surgeon: Carole Civil, MD;  Location: [facility];  Service: Orthopedics;  Laterality: Right;  . Acromio-clavicular joint repair Right 11/09/2012    Procedure: ACROMIO-CLAVICULAR JOINT REPAIR;  Surgeon: Carole Civil, MD;  Location: [facility];  Service:  Orthopedics;  Laterality: Right;   Family History  Problem Relation Age of Onset  . Kidney failure Father   . Kidney disease Father   . Stroke Brother 95    used drugs , stroke and heart disease  . Diabetes Brother   . Hypertension Brother   . Asthma    . Lung disease    . Cancer    . Heart disease    . Arthritis    . Cancer Daughter 36    breast   . Diabetes Mother    Social History  Substance Use Topics  . Smoking status: Former Smoker -- 1.00 packs/day for 30 years    Types: Cigarettes  . Smokeless tobacco: Former Systems developer    Quit date: 11/02/1988  . Alcohol Use: No   OB History    Gravida Para Term Preterm AB TAB SAB Ectopic Multiple Living   7 7 7       6      Review of Systems  All other systems reviewed and are negative.     Allergies  Ace inhibitors and Latex  Home Medications   Prior to Admission medications   Medication Sig Start Date End Date Taking? Authorizing Provider  acetaminophen (TYLENOL) 650 MG CR tablet Take 650 mg by mouth every 8 (eight) hours as needed for pain.   Yes Historical Provider, MD  albuterol (PROVENTIL HFA;VENTOLIN HFA) 108 (90 BASE) MCG/ACT inhaler Inhale 2 puffs into the lungs every 6 (six) hours as needed for wheezing or shortness of breath.  01/13/14  Yes Fayrene Helper, MD  albuterol (PROVENTIL) (2.5 MG/3ML) 0.083% nebulizer solution Take 2.5 mg by nebulization every 6 (six) hours as needed for wheezing or shortness of breath.   Yes Historical Provider, MD  amLODipine (NORVASC) 10 MG tablet Take 1 tablet (10 mg total) by mouth daily. 12/11/14  Yes Fayrene Helper, MD  aspirin (ASPIRIN LOW DOSE) 81 MG EC tablet Take 81 mg by mouth every morning.    Yes Historical Provider, MD  cetirizine (ZYRTEC) 10 MG tablet Take 10 mg by mouth daily as needed for allergies.   Yes Historical Provider, MD  fluticasone (FLONASE) 50 MCG/ACT nasal spray Place 2 sprays into the nose daily as needed for allergies.    Yes Historical Provider, MD   gabapentin (NEURONTIN) 300 MG capsule Take 1 capsule (300 mg total) by mouth at bedtime. 12/11/14  Yes Fayrene Helper, MD  glipiZIDE (GLUCOTROL XL) 10 MG 24 hr tablet Take 2 tablets (20 mg total) by mouth daily with breakfast. Patient taking differently: Take 20 mg by mouth every morning.  12/11/14  Yes Fayrene Helper, MD  glipiZIDE (GLUCOTROL XL) 5 MG 24 hr tablet Take 5 mg by mouth at bedtime.   Yes Historical Provider, MD  ibuprofen (ADVIL,MOTRIN) 200 MG tablet Take 400 mg by mouth every 6 (six) hours as needed for mild pain.   Yes Historical Provider, MD  metFORMIN (GLUCOPHAGE) 1000 MG tablet Take 1 tablet (1,000 mg total) by mouth 2 (two) times daily with a meal. 12/11/14  Yes Fayrene Helper, MD  montelukast (SINGULAIR) 10 MG tablet Take 1 tablet (10 mg total) by mouth at bedtime. 12/11/14  Yes Fayrene Helper, MD  pravastatin (PRAVACHOL) 40 MG tablet TAKE (1) TABLET BY MOUTH AT BEDTIME FOR CHOLESTEROL. 12/11/14  Yes Fayrene Helper, MD  sertraline (ZOLOFT) 50 MG tablet Take 1 tablet (50 mg total) by mouth daily. 12/11/14  Yes Fayrene Helper, MD  spironolactone (ALDACTONE) 100 MG tablet Take 1 tablet (100 mg total) by mouth daily. 12/11/14  Yes Fayrene Helper, MD  zolpidem (AMBIEN) 10 MG tablet TAKE (1) TABLET BY MOUTH AT BEDTIME AS NEEDED FOR SLEEP. Patient taking differently: Take 5 mg by mouth at bedtime. TAKE (1) TABLET BY MOUTH AT BEDTIME AS NEEDED FOR SLEEP. 12/11/14  Yes Fayrene Helper, MD  glucose blood [facility] VERIO) test strip Use as instructed three times daily dx E11.65 12/11/14   Fayrene Helper, MD  Insulin Detemir (LEVEMIR) 100 UNIT/ML Pen Inject 25 Units into the skin daily at 10 pm. Patient not taking: Reported on 05/19/2015 12/29/14   Fayrene Helper, MD  [facility] LANCETS [id number] MISC Three times daily testing dx E11.65 12/11/14   Fayrene Helper, MD  predniSONE (DELTASONE) 20 MG tablet Take 1 tablet (20 mg total) by mouth 2 (two) times daily.  05/19/15   Daleen Bo, MD   BP 137/64 mmHg  Pulse 108  Temp(Src) 100.7 F (38.2 C) (Oral)  Resp 16  Ht 5\' 2"  (1.575 m)  Wt 222 lb (100.699 kg)  BMI 40.59 kg/m2  SpO2 100% Physical Exam  Constitutional: She is oriented to person, place, and time. She appears well-developed and well-nourished. No distress.  HENT:  Head: Normocephalic and atraumatic.  Right Ear: External ear normal.  Left Ear: External ear normal.  Eyes: Conjunctivae and EOM are normal. Pupils are equal, round, and reactive to light.  Neck: Normal range of motion and phonation normal. Neck supple.  Cardiovascular: Normal rate, regular rhythm and normal heart sounds.   Pulmonary/Chest: Effort normal and breath sounds normal. No respiratory distress. She exhibits no bony tenderness.  Somewhat decreased air movement bilaterally, without audible wheezes, Rales or rhonchi.  Abdominal: Soft. There is no tenderness.  Musculoskeletal: Normal range of motion.  Neurological: She is alert and oriented to person, place, and time. No cranial nerve deficit or sensory deficit. She exhibits normal muscle tone. Coordination normal.  Skin: Skin is warm, dry and intact.  Psychiatric: She has a normal mood and affect. Her behavior is normal. Judgment and thought content normal.  Nursing note and vitals reviewed.   ED Course  Procedures (including critical care time)  Ongoing symptoms, which are nonspecific but most likely related to bronchitis. Chest x-ray does not indicate pneumonia. While screen for occult PE with d-dimer, and occult coronary disease/ischemia with EKG/troponin.  Medications  predniSONE (DELTASONE) tablet 60 mg (60 mg Oral Given 05/19/15 2139)  acetaminophen (TYLENOL) tablet 650 mg (650 mg Oral Given 05/19/15 2139)  ipratropium-albuterol (DUONEB) 0.5-2.5 (3) MG/3ML nebulizer solution 3 mL (3 mLs Nebulization Given 05/19/15 2142)  albuterol (PROVENTIL) (2.5 MG/3ML) 0.083% nebulizer solution 2.5 mg (2.5 mg Nebulization  Given 05/19/15 2142)    Patient Vitals for the past 24 hrs:  BP Temp Temp src Pulse Resp SpO2 Height Weight  05/19/15 2145 - - - 108 16 100 % - -  05/19/15 2142 - - - - - 96 % - -  05/19/15 2130 - - - 109 (!) 28 96 % - -  05/19/15 1925 137/64 mmHg 100.7 F (38.2 C) Oral 120 17 96 % 5\' 2"  (1.575 m) 222 lb (100.699 kg)    11:07 PM Reevaluation with update and discussion. After initial assessment and treatment, an updated evaluation reveals she feels better at this time. No further complaints. Findings discussed with patient, all questions were answered.Daleen Bo L     Labs Review Labs Reviewed  BASIC METABOLIC PANEL - Abnormal; Notable for the following:    Glucose, Bld 125 (*)    All other components within normal limits  CBC WITH DIFFERENTIAL/PLATELET - Abnormal; Notable for the following:    WBC 18.5 (*)    Neutro Abs 14.5 (*)    Monocytes Absolute 1.8 (*)    All other components within normal limits  D-DIMER, QUANTITATIVE (NOT AT [facility]) - Abnormal; Notable for the following:    D-Dimer, Quant 0.58 (*)    All other components within normal limits  CBG MONITORING, ED - Abnormal; Notable for the following:    Glucose-Capillary 138 (*)    All other components within normal limits  TROPONIN I    Imaging Review Dg Chest 2 View  05/19/2015  CLINICAL DATA:  Cough, fever, shortness of breath and chest pain. Ex-smoker. History of asthma and diabetes. EXAM: CHEST  2 VIEW COMPARISON:  Chest x-ray dated 05/04/2014 FINDINGS: Cardiomediastinal silhouette is stable in size and configuration. Atherosclerotic changes again noted at the aortic arch. Lungs are clear. No evidence of pneumonia. No pleural effusion. No pneumothorax seen. Osseous and soft tissue structures about the chest are unremarkable. IMPRESSION: No active cardiopulmonary disease. Electronically Signed   By: Franki Cabot M.D.   On: 05/19/2015 19:43   I have personally reviewed and evaluated these images and lab results as  part of my medical decision-making.   EKG Interpretation   Date/Time:  Tuesday May 19 2015 21:41:14 EDT Ventricular Rate:  115 PR Interval:  151 QRS Duration: 86 QT  Interval:  315 QTC Calculation: 436 R Axis:   15 Text Interpretation:  Sinus tachycardia Since last tracing rate faster  Confirmed by [facility]  MD, Bladen Umar [phone]) on 05/19/2015 9:50:41 PM      MDM   Final diagnoses:  Acute bronchitis, unspecified organism    Evaluation is consistent with post URI, bronchitis. Evidence for pneumonia, metabolic instability or impending vascular collapse.  Nursing Notes Reviewed/ Care Coordinated Applicable Imaging Reviewed Interpretation of Laboratory Data incorporated into ED treatment  The patient appears reasonably screened and/or stabilized for discharge and I doubt any other medical condition or other [facility] requiring further screening, evaluation, or treatment in the ED at this time prior to discharge.  Plan: Home Medications- Prednisone; Home Treatments- rest; return here if the recommended treatment, does not improve the symptoms; Recommended follow up- PCP prn     Daleen Bo, MD 05/19/15 2308

## 2015-05-19 NOTE — Telephone Encounter (Signed)
Spoke with patient and she states that if she does not go to the ED tonight that she will call in the am to be worked in.

## 2015-05-19 NOTE — Telephone Encounter (Signed)
Patient states that she has had productive cough x 5 days. Unable to afford medication right now and that is why she is requesting it from [facility].  Encouraged her to come in for an office visit and she states that she feels too weak.  Advised her that with the shortness of breath and chest discomfort with cough that she may need to go to the ED.  Please advise.

## 2015-05-19 NOTE — Telephone Encounter (Signed)
She has called back and stated that her lungs hurt and feels heavy

## 2015-05-20 ENCOUNTER — Telehealth: Payer: Self-pay | Admitting: Family Medicine

## 2015-05-20 NOTE — Telephone Encounter (Signed)
Pls call pt , let her know I reviewed Ed visit. Hope she is feeling better   Needs at least CBc and diff next week, should get additional due labs as well. Want herf/u next week, BUT if she still puts off explain labsd needed as wBC was BVERY high in ED concerned re infection , she was prescribed prednisone  9 dont see abxc) encourage f/u this week if still c/o fever pls

## 2015-05-22 NOTE — Telephone Encounter (Signed)
Called and left message for patient to return call.  

## 2015-05-25 NOTE — Telephone Encounter (Signed)
Patient will have to have done on appointment day because she does not have transportation.

## 2015-05-26 ENCOUNTER — Encounter: Payer: Self-pay | Admitting: Family Medicine

## 2015-05-26 ENCOUNTER — Ambulatory Visit (INDEPENDENT_AMBULATORY_CARE_PROVIDER_SITE_OTHER): Payer: Commercial Managed Care - HMO | Admitting: Family Medicine

## 2015-05-26 VITALS — BP 122/74 | HR 97 | Temp 98.6°F | Resp 18 | Ht 63.0 in | Wt 208.0 lb

## 2015-05-26 DIAGNOSIS — J209 Acute bronchitis, unspecified: Secondary | ICD-10-CM | POA: Diagnosis not present

## 2015-05-26 DIAGNOSIS — E785 Hyperlipidemia, unspecified: Secondary | ICD-10-CM

## 2015-05-26 DIAGNOSIS — I10 Essential (primary) hypertension: Secondary | ICD-10-CM | POA: Diagnosis not present

## 2015-05-26 DIAGNOSIS — J3089 Other allergic rhinitis: Secondary | ICD-10-CM | POA: Diagnosis not present

## 2015-05-26 DIAGNOSIS — Z1159 Encounter for screening for other viral diseases: Secondary | ICD-10-CM

## 2015-05-26 DIAGNOSIS — Z114 Encounter for screening for human immunodeficiency virus [HIV]: Secondary | ICD-10-CM

## 2015-05-26 DIAGNOSIS — IMO0001 Reserved for inherently not codable concepts without codable children: Secondary | ICD-10-CM

## 2015-05-26 DIAGNOSIS — E559 Vitamin D deficiency, unspecified: Secondary | ICD-10-CM

## 2015-05-26 DIAGNOSIS — E669 Obesity, unspecified: Secondary | ICD-10-CM

## 2015-05-26 DIAGNOSIS — E1169 Type 2 diabetes mellitus with other specified complication: Secondary | ICD-10-CM

## 2015-05-26 DIAGNOSIS — E119 Type 2 diabetes mellitus without complications: Secondary | ICD-10-CM

## 2015-05-26 MED ORDER — PENICILLIN V POTASSIUM 500 MG PO TABS: 500.0000 mg | ORAL_TABLET | Freq: Three times a day (TID) | ORAL | Status: DC

## 2015-05-26 MED ORDER — FLUTICASONE PROPIONATE 50 MCG/ACT NA SUSP: 2.0000 | Freq: Every day | NASAL | Status: DC

## 2015-05-26 NOTE — Progress Notes (Signed)
   Subjective:    Patient ID: Tiffany Jacobs, female    DOB: Feb 25, 1946, 71 y.o.   MRN: [id number]  HPI   Tiffany Jacobs     MRN: [id number]      DOB: 03/28/1945   HPI Tiffany Jacobs is here for follow up of recent eD visit for acute bronchitis, she had marked leukocytosis at the time and is in for re evaluation. Still c/o excessive cough and chest congestion and fatigue , also intermittent chills. R re-evaluation of chronic medical conditions, medication management and review of any available recent lab and radiology data is also done at this visit. Labs are drawn on day of visit and she is to be contacted with f/u recommendations  Preventive health is updated, specifically  Cancer screening and Immunization.   Questions or concerns regarding consultations or procedures which the PT has had in the interim are  addressed. The PT denies any adverse reactions to current medications since the last visit.  ROS  Denies sinus pressure, nasal congestion, ear pain or sore throat. . Denies chest pains, palpitations and leg swelling Denies abdominal pain, nausea, vomiting,diarrhea or constipation.   Denies c/o generalized joint pain, swelling and limitation in mobility. Denies headaches, seizures, numbness, or tingling. Denies depression, anxiety or insomnia. Denies skin break down or rash.   PE  BP 122/74 mmHg  Pulse 97  Temp(Src) 98.6 F (37 C)  Resp 18  Ht 5\' 3"  (1.6 m)  Wt 208 lb (94.348 kg)  BMI 36.85 kg/m2  SpO2 93%  Patient alert and oriented and in no cardiopulmonary distress.Ill appearing Decreased air entry , bilateral crackles, no wheezesClear to auscultation bilaterally.  CVS: S1, S2 no murmurs, no S3.Regular rate.  ABD: Soft non tender.   Ext: No edema  MS: Adequate ROM spine, shoulders, hips and knees.  Skin: Intact, no ulcerations or rash noted.  Psych: Good eye contact, normal affect. Memory intact not anxious or depressed appearing.  CNS: CN 2-12  intact, power,  normal throughout.no focal deficits noted.   Assessment & Plan        Review of Systems     Objective:   Physical Exam        Assessment & Plan:

## 2015-05-26 NOTE — Patient Instructions (Addendum)
Annual wellness exam in 3 month, call if you need me sooner  Labs today  We will call tomorrow with lab results.  Nasal spray sent in for daily use for allergy symptoms  Penicillin is sent to your pharmacy

## 2015-05-27 LAB — CBC WITH DIFFERENTIAL/PLATELET
BASOS PCT: 0 % (ref 0–1)
Basophils Absolute: 0 10*3/uL (ref 0.0–0.1)
Eosinophils Absolute: 0.1 10*3/uL (ref 0.0–0.7)
Eosinophils Relative: 1 % (ref 0–5)
HEMATOCRIT: 40.6 % (ref 36.0–46.0)
HEMOGLOBIN: 13.3 g/dL (ref 12.0–15.0)
LYMPHS ABS: 2.6 10*3/uL (ref 0.7–4.0)
Lymphocytes Relative: 19 % (ref 12–46)
MCH: 27 pg (ref 26.0–34.0)
MCHC: 32.8 g/dL (ref 30.0–36.0)
MCV: 82.5 fL (ref 78.0–100.0)
MONO ABS: 0.8 10*3/uL (ref 0.1–1.0)
MONOS PCT: 6 % (ref 3–12)
MPV: 9.3 fL (ref 8.6–12.4)
NEUTROS ABS: 10 10*3/uL — AB (ref 1.7–7.7)
NEUTROS PCT: 74 % (ref 43–77)
Platelets: 543 10*3/uL — ABNORMAL HIGH (ref 150–400)
RBC: 4.92 MIL/uL (ref 3.87–5.11)
RDW: 14.4 % (ref 11.5–15.5)
WBC: 13.5 10*3/uL — ABNORMAL HIGH (ref 4.0–10.5)

## 2015-05-27 LAB — LIPID PANEL
CHOL/HDL RATIO: 3.2 ratio (ref <=5.0)
CHOLESTEROL: 112 mg/dL — AB (ref 125–200)
HDL: 35 mg/dL — AB (ref >=46)
LDL Cholesterol: 57 mg/dL (ref <130)
TRIGLYCERIDES: 101 mg/dL (ref <150)
VLDL: 20 mg/dL (ref <30)

## 2015-05-27 LAB — HEMOGLOBIN A1C
Hgb A1c MFr Bld: 9.1 % — ABNORMAL HIGH (ref <5.7)
Mean Plasma Glucose: 214 mg/dL — ABNORMAL HIGH (ref <117)

## 2015-05-27 LAB — VITAMIN D 25 HYDROXY (VIT D DEFICIENCY, FRACTURES): Vit D, 25-Hydroxy: 23 ng/mL — ABNORMAL LOW (ref 30–100)

## 2015-05-27 LAB — HEPATIC FUNCTION PANEL
ALBUMIN: 3.6 g/dL (ref 3.6–5.1)
ALK PHOS: 91 U/L (ref 33–130)
ALT: 26 U/L (ref 6–29)
AST: 20 U/L (ref 10–35)
BILIRUBIN TOTAL: 0.3 mg/dL (ref 0.2–1.2)
Bilirubin, Direct: 0.1 mg/dL (ref <=0.2)
Indirect Bilirubin: 0.2 mg/dL (ref 0.2–1.2)
TOTAL PROTEIN: 7.2 g/dL (ref 6.1–8.1)

## 2015-05-27 LAB — HEPATITIS C ANTIBODY: HCV Ab: NEGATIVE

## 2015-05-27 LAB — TSH: TSH: 2.75 m[IU]/L

## 2015-06-04 ENCOUNTER — Ambulatory Visit (HOSPITAL_COMMUNITY)
Admission: RE | Admit: 2015-06-04 | Discharge: 2015-06-04 | Disposition: A | Discharge status: Home / Self Care | Payer: Commercial Managed Care - HMO | Source: Ambulatory Visit | Attending: Family Medicine | Admitting: Family Medicine

## 2015-06-04 DIAGNOSIS — Z1231 Encounter for screening mammogram for malignant neoplasm of breast: Secondary | ICD-10-CM | POA: Insufficient documentation

## 2015-06-12 ENCOUNTER — Encounter: Payer: Commercial Managed Care - HMO | Admitting: Family Medicine

## 2015-06-13 ENCOUNTER — Telehealth: Payer: Self-pay | Admitting: Family Medicine

## 2015-06-13 DIAGNOSIS — E1165 Type 2 diabetes mellitus with hyperglycemia: Secondary | ICD-10-CM

## 2015-06-13 DIAGNOSIS — E118 Type 2 diabetes mellitus with unspecified complications: Principal | ICD-10-CM

## 2015-06-13 NOTE — Telephone Encounter (Signed)
I planned to discuss labs at visit pt missed yesterday. HBA1C is 9.1 ,  Better but still high. Pls verify metformin 1000 mg twice daily and glpizide 20 mg (total, two 10 mg )daily She nEEDS long acting insulin ( lantus or levimir) along with this start at 10 units and increase by 3 units every 3 days, send script for 25 units daily If cannot aford, then will due humalog 75/25 mix 5 units twice daily with breakfast and dinnr. We NEED TO dISCUSS after you speak with her so pLS find me! Decrease pravachol from 40 mg to 20 mg daily, pls send new script for 5 months  NEEDS f/u with log in May, has [facility] rescheduled for June this is too far away and a different visit Will discuss other labs at her f/u in May which I am asking you to schedule pls  THANKS!!!

## 2015-06-17 MED ORDER — PRAVASTATIN SODIUM 20 MG PO TABS: 20.0000 mg | ORAL_TABLET | Freq: Every day | ORAL | Status: DC

## 2015-06-17 MED ORDER — GLIPIZIDE ER 10 MG PO TB24: 20.0000 mg | ORAL_TABLET | Freq: Every day | ORAL | Status: DC

## 2015-06-17 MED ORDER — INSULIN GLARGINE 100 UNIT/ML SOLOSTAR PEN: 25.0000 [IU] | PEN_INJECTOR | Freq: Every day | SUBCUTANEOUS | Status: DC

## 2015-06-17 NOTE — Telephone Encounter (Signed)
Pt coming tomorrow to meet in person to discuss to ensure understanding

## 2015-06-17 NOTE — Addendum Note (Signed)
Addended by: Eual Fines on: 06/17/2015 02:16 PM   Modules accepted: Orders, Medications

## 2015-06-20 NOTE — Assessment & Plan Note (Signed)
Uncontrolled , ad daily flonase

## 2015-06-20 NOTE — Assessment & Plan Note (Signed)
Hyperlipidemia:Low fat diet discussed and encouraged.   Lipid Panel  Lab Results  Component Value Date   CHOL 112* 05/26/2015   HDL 35* 05/26/2015   LDLCALC 57 05/26/2015   TRIG 101 05/26/2015   CHOLHDL 3.2 05/26/2015    Needs to increase exercise, dose reduction in pravstatin

## 2015-06-20 NOTE — Assessment & Plan Note (Signed)
Controlled, no change in medication DASH diet and commitment to daily physical activity for a minimum of 30 minutes discussed and encouraged, as a part of hypertension management. The importance of attaining a healthy weight is also discussed.  BP/Weight 05/26/2015 05/19/2015 12/25/2014 10/30/2014 06/06/2014 05/04/2014 [id number]  Systolic BP 123XX123 Q000111Q 123XX123 123XX123 123XX123 [id number] 123456  Diastolic BP 74 [id number] 74 84 82 51 74  Wt. (Lbs) 208 222 221 222 218 214 217.12  BMI 36.85 40.59 39.16 39.34 38.63 38.49 38.47

## 2015-06-20 NOTE — Assessment & Plan Note (Signed)
Deteriorated. Patient re-educated about  the importance of commitment to a  minimum of 150 minutes of exercise per week.  The importance of healthy food choices with portion control discussed. Encouraged to start a food diary, count calories and to consider  joining a support group. Sample diet sheets offered. Goals set by the patient for the next several months.   Weight /BMI 05/26/2015 05/19/2015 12/25/2014  WEIGHT 208 lb 222 lb 221 lb  HEIGHT 5\' 3"  5\' 2"  5\' 3"   BMI 36.85 kg/m2 40.59 kg/m2 39.16 kg/m2    Current exercise per week 60 minutes.

## 2015-06-20 NOTE — Assessment & Plan Note (Addendum)
Improved, though still uncontrolled, despite report of "good numbers" at visit, also states that she can obtain her meds, dose adjustment made after the visit since labs available after visit Tiffany Jacobs is reminded of the importance of commitment to daily physical activity for 30 minutes or more, as able and the need to limit carbohydrate intake to 30 to 60 grams per meal to help with blood sugar control.   The need to take medication as prescribed, test blood sugar as directed, and to call between visits if there is a concern that blood sugar is uncontrolled is also discussed.   Tiffany Jacobs is reminded of the importance of daily foot exam, annual eye examination, and good blood sugar, blood pressure and cholesterol control.  Diabetic Labs Latest Ref Rng 05/26/2015 05/19/2015 09/05/2014 06/02/2014 01/15/2014  HbA1c <5.7 % 9.1(H) - 11.4(H) 10.9(H) 7.5(H)  Microalbumin <2.0 mg/dL - - - 0.6 -  Micro/Creat Ratio 0.0 - 30.0 mg/g - - - 22.1 -  Chol 125 - 200 mg/dL 112(L) - - 142 -  HDL >=46 mg/dL 35(L) - - 51 -  Calc LDL <130 mg/dL 57 - - 70 -  Triglycerides <150 mg/dL 101 - - 106 -  Creatinine 0.44 - 1.00 mg/dL - 0.77 0.75 0.77 0.72   BP/Weight 05/26/2015 05/19/2015 12/25/2014 10/30/2014 06/06/2014 05/04/2014 [id number]  Systolic BP 123XX123 Q000111Q 123XX123 123XX123 123XX123 A999333 123456  Diastolic BP 74 [id number] 74 84 82 51 74  Wt. (Lbs) 208 222 221 222 218 214 217.12  BMI 36.85 40.59 39.16 39.34 38.63 38.49 38.47   Foot/eye exam completion dates Latest Ref Rng 06/19/2014 06/06/2014  Eye Exam No Retinopathy No Retinopathy -  Foot exam Order - - -  Foot Form Completion - - Done

## 2015-06-20 NOTE — Assessment & Plan Note (Signed)
Still symptomatic with cough , soputum , fatigue and chills , penicillin prescribed

## 2015-06-24 ENCOUNTER — Telehealth: Payer: Self-pay

## 2015-06-24 MED ORDER — PANTOPRAZOLE SODIUM 40 MG PO TBEC: 40.0000 mg | DELAYED_RELEASE_TABLET | Freq: Every day | ORAL | Status: DC

## 2015-06-24 NOTE — Telephone Encounter (Signed)
[  protonix sent and I recommend GI eval due to dysphagia, pls refer if she agrees I will sign

## 2015-06-24 NOTE — Telephone Encounter (Signed)
Pt states that she keeps a dry cough and a lot of reflux and it wakes her up at night and also feels like her food has trouble going down. Not currently taking anything for reflux. Wants something sent to [facility]

## 2015-06-24 NOTE — Telephone Encounter (Signed)
Patient aware med sent and will call back if she wants GI referral

## 2015-06-24 NOTE — Telephone Encounter (Signed)
Will come in after lunch

## 2015-07-01 DIAGNOSIS — H5210 Myopia, unspecified eye: Secondary | ICD-10-CM | POA: Diagnosis not present

## 2015-07-01 DIAGNOSIS — H5200 Hypermetropia, unspecified eye: Secondary | ICD-10-CM | POA: Diagnosis not present

## 2015-07-01 DIAGNOSIS — E119 Type 2 diabetes mellitus without complications: Secondary | ICD-10-CM | POA: Diagnosis not present

## 2015-07-01 DIAGNOSIS — I10 Essential (primary) hypertension: Secondary | ICD-10-CM | POA: Diagnosis not present

## 2015-08-27 ENCOUNTER — Encounter: Payer: Self-pay | Admitting: Family Medicine

## 2015-08-27 ENCOUNTER — Ambulatory Visit (INDEPENDENT_AMBULATORY_CARE_PROVIDER_SITE_OTHER): Payer: Commercial Managed Care - HMO | Admitting: Family Medicine

## 2015-08-27 VITALS — BP 150/94 | HR 90 | Resp 16 | Ht 63.0 in | Wt 213.0 lb

## 2015-08-27 DIAGNOSIS — E1169 Type 2 diabetes mellitus with other specified complication: Secondary | ICD-10-CM

## 2015-08-27 DIAGNOSIS — Z23 Encounter for immunization: Secondary | ICD-10-CM | POA: Diagnosis not present

## 2015-08-27 DIAGNOSIS — E119 Type 2 diabetes mellitus without complications: Secondary | ICD-10-CM

## 2015-08-27 DIAGNOSIS — Z0000 Encounter for general adult medical examination without abnormal findings: Secondary | ICD-10-CM

## 2015-08-27 DIAGNOSIS — I10 Essential (primary) hypertension: Secondary | ICD-10-CM

## 2015-08-27 DIAGNOSIS — E669 Obesity, unspecified: Secondary | ICD-10-CM

## 2015-08-27 MED ORDER — HYDRALAZINE HCL 25 MG PO TABS: 25.0000 mg | ORAL_TABLET | Freq: Three times a day (TID) | ORAL | Status: DC

## 2015-08-27 NOTE — Patient Instructions (Addendum)
F/u last week in July, cal,if you need me befor E  BP is high, new additional medication is hydrtallazine twke 8 hrs aprt, 8am , 4 pm an 11pm   HBA`1C, , cmp and EGFR non fast asap  Microalb today  Pneumonia vaccine today

## 2015-08-27 NOTE — Progress Notes (Signed)
Subjective:    Patient ID: Tiffany Jacobs, female    DOB: 01-Sep-1945, 70 y.o.   MRN: [id number]  HPI Preventive Screening-Counseling & Management   Patient present here today for a Medicare annual wellness visit.   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 No  Over the past two weeks, have you felt little interest or pleasure in doing things? No  Have you lost interest or pleasure in daily life? No  Do you often feel hopeless? No  Do you cry easily over simple problems? No   Activities of Daily Living  In your present state of health, do you have any difficulty performing the following activities?  Driving?: No Managing money?: No Feeding yourself?:No Getting from bed to chair?: sometimes due to right leg pain  Climbing a flight of stairs?: Can climb a few  Preparing food and eating?:No Bathing or showering?:No Getting dressed?:No Getting to the toilet?:No Using the toilet?:No Moving around from place to place?: can't walk long distances/right leg pain   Fall Risk Assessment In the past year have you fallen or had a near fall?:No Are you currently taking any medications that make you dizzy?:No   Hearing Difficulties: No Do you often ask people to speak up or repeat themselves?:No Do you experience ringing or noises in your ears?:No Do you have difficulty understanding soft or whispered  voices?:No  Cognitive Testing  Alert? Yes Normal Appearance?Yes  Oriented to person? Yes Place? Yes  Time? Yes  Displays appropriate judgment?Yes  Can read the correct time from a watch face? yes Are you having problems remembering things?No  Advanced Directives have been discussed with the patient?Yes, no living will  , full code   List the Names of Other Physician/Practitioners you currently use: (updated)   Indicate any recent Medical Services you may have received from other than [facility] providers in the past year (date may be approximate).   Assessment:    Annual Wellness Exam   Plan:    Medicare Attestation  I have personally reviewed:  The patient's medical and social history  Their use of alcohol, tobacco or illicit drugs  Their current medications and supplements  The patient's functional ability including ADLs,fall risks, home safety risks, cognitive, and hearing and visual impairment  Diet and physical activities  Evidence for depression or mood disorders  The patient's weight, height, BMI, and visual acuity have been recorded in the chart. I have made referrals, counseling, and provided education to the patient based on review of the above and I have provided the patient with a written personalized care plan for preventive services.      Review of Systems     Objective:   Physical Exam  BP 150/94 mmHg  Pulse 90  Resp 16  Ht 5\' 3"  (1.6 m)  Wt 213 lb (96.616 kg)  BMI 37.74 kg/m2  SpO2 96%  CVS: Heart sounds S1 and S2 , no murmurs, no S3.  No JVD. No pedal edema      Assessment & Plan:  Medicare annual wellness visit, subsequent Annual exam as documented. Counseling done  re healthy lifestyle involving commitment to 150 minutes exercise per week, heart healthy diet, and attaining healthy weight.The importance of adequate sleep also discussed. Regular seat belt use and home safety, is also discussed. Changes in health habits are decided on by the  patient with goals and time frames  set for achieving them. Immunization and cancer screening needs are specifically addressed at this visit.   Diabetes mellitus type 2 in obese (HCC) Non compliant and uncontrolled, has not been taking lantus , just "did not get it"Updated lab needed at/ before next visit.   Essential hypertension Uncontrolled, add hydrallazine to current regime DASH diet and commitment to daily physical activity for a minimum of 30 minutes discussed and encouraged, as a part of hypertension management. The importance of attaining a healthy weight is also discussed.  BP/Weight 08/27/2015 05/26/2015 05/19/2015 12/25/2014 10/30/2014 06/06/2014 [id number]  Systolic BP Q000111Q 123XX123 Q000111Q 123XX123 123XX123 123XX123 [id number]  Diastolic BP 94 74 A999333 74 84 82 51  Wt. (Lbs) 213 208 222 221 222 218 214  BMI 37.74 36.85 40.59 39.16 39.34 38.63 38.49        Need for 23-polyvalent pneumococcal polysaccharide vaccine After obtaining informed consent, the vaccine is  administered by LPN.

## 2015-08-28 ENCOUNTER — Other Ambulatory Visit: Payer: Self-pay

## 2015-08-28 ENCOUNTER — Encounter: Payer: Self-pay | Admitting: Family Medicine

## 2015-08-28 ENCOUNTER — Other Ambulatory Visit (HOSPITAL_COMMUNITY)
Admission: RE | Admit: 2015-08-28 | Discharge: 2015-08-28 | Disposition: A | Discharge status: Home / Self Care | Payer: Commercial Managed Care - HMO | Source: Ambulatory Visit | Attending: Family Medicine | Admitting: Family Medicine

## 2015-08-28 DIAGNOSIS — F329 Major depressive disorder, single episode, unspecified: Secondary | ICD-10-CM

## 2015-08-28 DIAGNOSIS — E119 Type 2 diabetes mellitus without complications: Secondary | ICD-10-CM | POA: Diagnosis not present

## 2015-08-28 DIAGNOSIS — Z23 Encounter for immunization: Secondary | ICD-10-CM | POA: Insufficient documentation

## 2015-08-28 DIAGNOSIS — E669 Obesity, unspecified: Secondary | ICD-10-CM | POA: Diagnosis not present

## 2015-08-28 DIAGNOSIS — I10 Essential (primary) hypertension: Secondary | ICD-10-CM

## 2015-08-28 DIAGNOSIS — M5010 Cervical disc disorder with radiculopathy, unspecified cervical region: Secondary | ICD-10-CM

## 2015-08-28 DIAGNOSIS — F32A Depression, unspecified: Secondary | ICD-10-CM

## 2015-08-28 MED ORDER — AMLODIPINE BESYLATE 10 MG PO TABS
10.0000 mg | ORAL_TABLET | Freq: Every day | ORAL | Status: DC
Start: 2015-08-28 — End: 2017-05-30

## 2015-08-28 MED ORDER — SPIRONOLACTONE 100 MG PO TABS: 100.0000 mg | ORAL_TABLET | Freq: Every day | ORAL | Status: DC

## 2015-08-28 MED ORDER — GABAPENTIN 300 MG PO CAPS: 300.0000 mg | ORAL_CAPSULE | Freq: Every day | ORAL | Status: DC

## 2015-08-28 MED ORDER — SERTRALINE HCL 50 MG PO TABS: 50.0000 mg | ORAL_TABLET | Freq: Every day | ORAL | Status: DC

## 2015-08-28 NOTE — Assessment & Plan Note (Signed)
Uncontrolled, add hydrallazine to current regime DASH diet and commitment to daily physical activity for a minimum of 30 minutes discussed and encouraged, as a part of hypertension management. The importance of attaining a healthy weight is also discussed.  BP/Weight 08/27/2015 05/26/2015 05/19/2015 12/25/2014 10/30/2014 06/06/2014 [id number]  Systolic BP Q000111Q 123XX123 Q000111Q 123XX123 123XX123 [id number] [id number]  Diastolic BP 94 74 A999333 74 84 82 51  Wt. (Lbs) 213 208 222 221 222 218 214  BMI 37.74 36.85 40.59 39.16 39.34 38.63 38.49

## 2015-08-28 NOTE — Assessment & Plan Note (Signed)
After obtaining informed consent, the vaccine is  administered by LPN.  

## 2015-08-28 NOTE — Assessment & Plan Note (Signed)

## 2015-08-28 NOTE — Assessment & Plan Note (Signed)
Non compliant and uncontrolled, has not been taking lantus , just "did not get it"Updated lab needed at/ before next visit.

## 2015-08-29 LAB — MICROALBUMIN / CREATININE URINE RATIO
CREATININE, UR: 135.6 mg/dL
MICROALB/CREAT RATIO: 29.2 mg/g{creat} (ref 0.0–30.0)
Microalb, Ur: 39.6 ug/mL — ABNORMAL HIGH

## 2015-09-16 ENCOUNTER — Other Ambulatory Visit: Payer: Self-pay

## 2015-09-16 MED ORDER — ZOLPIDEM TARTRATE 10 MG PO TABS: ORAL_TABLET | ORAL | Status: DC

## 2015-10-01 ENCOUNTER — Ambulatory Visit (INDEPENDENT_AMBULATORY_CARE_PROVIDER_SITE_OTHER): Payer: Commercial Managed Care - HMO | Admitting: Family Medicine

## 2015-10-01 ENCOUNTER — Encounter: Payer: Self-pay | Admitting: Family Medicine

## 2015-10-01 VITALS — BP 122/82 | HR 99 | Resp 16 | Ht 63.0 in | Wt 211.4 lb

## 2015-10-01 DIAGNOSIS — F329 Major depressive disorder, single episode, unspecified: Secondary | ICD-10-CM

## 2015-10-01 DIAGNOSIS — E785 Hyperlipidemia, unspecified: Secondary | ICD-10-CM

## 2015-10-01 DIAGNOSIS — E669 Obesity, unspecified: Secondary | ICD-10-CM

## 2015-10-01 DIAGNOSIS — E119 Type 2 diabetes mellitus without complications: Secondary | ICD-10-CM

## 2015-10-01 DIAGNOSIS — I10 Essential (primary) hypertension: Secondary | ICD-10-CM

## 2015-10-01 DIAGNOSIS — IMO0001 Reserved for inherently not codable concepts without codable children: Secondary | ICD-10-CM

## 2015-10-01 DIAGNOSIS — J45991 Cough variant asthma: Secondary | ICD-10-CM

## 2015-10-01 DIAGNOSIS — G4700 Insomnia, unspecified: Secondary | ICD-10-CM

## 2015-10-01 DIAGNOSIS — F32A Depression, unspecified: Secondary | ICD-10-CM

## 2015-10-01 DIAGNOSIS — Z9119 Patient's noncompliance with other medical treatment and regimen: Secondary | ICD-10-CM

## 2015-10-01 DIAGNOSIS — E1169 Type 2 diabetes mellitus with other specified complication: Secondary | ICD-10-CM

## 2015-10-01 DIAGNOSIS — Z91199 Patient's noncompliance with other medical treatment and regimen due to unspecified reason: Secondary | ICD-10-CM

## 2015-10-01 MED ORDER — MONTELUKAST SODIUM 10 MG PO TABS: 10.0000 mg | ORAL_TABLET | Freq: Every day | ORAL | 1 refills | Status: DC

## 2015-10-01 NOTE — Assessment & Plan Note (Signed)
Uncontrolled , needs to resume singulair, will send in med as discussed at visit

## 2015-10-01 NOTE — Assessment & Plan Note (Signed)
Controlled, no change in medication  

## 2015-10-01 NOTE — Progress Notes (Signed)
Tiffany Jacobs     MRN: [id number]      DOB: 07/29/1945   HPI Tiffany Jacobs is here for follow up and re-evaluation of chronic medical conditions, medication management and review of any available recent lab and radiology data.  Preventive health is updated, specifically  Cancer screening and Immunization.   Questions or concerns regarding consultations or procedures which the PT has had in the interim are  addressed. The PT reports that her blood sugars remain uncontrolled as she remains non compliant eating late at night with poor food choice, states she intends to change this and understands the need, states FBG seldom under 200, but she "feels well" states unable to afford insulin  ROS Denies recent fever or chills. Denies sinus pressure, nasal congestion, ear pain or sore throat. Denies chest congestion, productive cough or wheezing. Denies chest pains, palpitations and leg swelling Denies abdominal pain, nausea, vomiting,diarrhea or constipation.   Denies dysuria, frequency, hesitancy or incontinence. Denies joint pain, swelling and limitation in mobility. Denies headaches, seizures, numbness, or tingling. Denies depression, anxiety or insomnia. Denies skin break down or rash.   PE  BP 122/82 (BP Location: Left Arm, Patient Position: Sitting, Cuff Size: Large)   Pulse 99   Resp 16   Ht 5\' 3"  (1.6 m)   Wt 211 lb 6.4 oz (95.9 kg)   SpO2 95%   BMI 37.45 kg/m   Patient alert and oriented and in no cardiopulmonary distress.  HEENT: No facial asymmetry, EOMI,   oropharynx pink and moist.  Neck supple no JVD, no mass.  Chest: Clear to auscultation bilaterally.  CVS: S1, S2 no murmurs, no S3.Regular rate.  ABD: Soft non tender.   Ext: No edema  MS: Adequate ROM spine, shoulders, hips and knees.  Skin: Intact, no ulcerations or rash noted.  Psych: Good eye contact, normal affect. Memory intact not anxious or depressed appearing.  CNS: CN 2-12 intact, power,   normal throughout.no focal deficits noted.   Assessment & Plan  Diabetes mellitus type 2 in obese [facility]) Uncontrolled Updated lab needed  Tiffany Jacobs is reminded of the importance of commitment to daily physical activity for 30 minutes or more, as able and the need to limit carbohydrate intake to 30 to 60 grams per meal to help with blood sugar control.   The need to take medication as prescribed, test blood sugar as directed, and to call between visits if there is a concern that blood sugar is uncontrolled is also discussed.   Tiffany Jacobs is reminded of the importance of daily foot exam, annual eye examination, and good blood sugar, blood pressure and cholesterol control.  Diabetic Labs Latest Ref Rng & Units 08/28/2015 05/26/2015 05/19/2015 09/05/2014 06/02/2014  HbA1c <5.7 % - 9.1(H) - 11.4(H) 10.9(H)  Microalbumin Not Estab. ug/mL 39.6(H) - - - 0.6  Micro/Creat Ratio 0.0 - 30.0 mg/g creat 29.2 - - - 22.1  Chol 125 - 200 mg/dL - 112(L) - - 142  HDL >=46 mg/dL - 35(L) - - 51  Calc LDL <130 mg/dL - 57 - - 70  Triglycerides <150 mg/dL - 101 - - 106  Creatinine 0.44 - 1.00 mg/dL - - 0.77 0.75 0.77   BP/Weight 10/01/2015 08/27/2015 05/26/2015 05/19/2015 12/25/2014 [id number] 0000000  Systolic BP 123XX123 Q000111Q 123XX123 Q000111Q 123XX123 123XX123 123XX123  Diastolic BP 82 94 74 A999333 74 84 82  Wt. (Lbs) 211.4 213 208 222 221 222 218  BMI 37.45 37.74 36.85 40.59 39.16 39.34 38.63  Foot/eye exam completion dates Latest Ref Rng & Units 08/27/2015 06/19/2014  Eye Exam No Retinopathy - No Retinopathy  Foot exam Order - - -  Foot Form Completion - Done -   Will need to use OTC insulin as cost is prohibitive pof any other, will decide on dose once lab is  upadted      COUGH VARIANT ASTHMA Uncontrolled , needs to resume singulair, will send in med as discussed at visit  Depression Controlled, no change in medication   Personal history of noncompliance with medical treatment, presenting hazards to health An ongoing  chanllenge , specifically with regard to diabetes control, the hope is that this will improve, she understands the need to comply with both diet and medication   Insomnia Controlled, no change in medication Sleep hygiene reviewed and written information offered also. Prescription sent for  medication needed.   Obesity, Class II, BMI 35.0-39.9, with comorbidity (see actual BMI) Deteriorated. Patient re-educated about  the importance of commitment to a  minimum of 150 minutes of exercise per week.  The importance of healthy food choices with portion control discussed. Encouraged to start a food diary, count calories and to consider  joining a support group. Sample diet sheets offered. Goals set by the patient for the next several months.   Weight /BMI 10/01/2015 08/27/2015 05/26/2015  WEIGHT 211 lb 6.4 oz 213 lb 208 lb  HEIGHT 5\' 3"  5\' 3"  5\' 3"   BMI 37.45 kg/m2 37.74 kg/m2 36.85 kg/m2

## 2015-10-01 NOTE — Assessment & Plan Note (Signed)
Uncontrolled Updated lab needed  Ms. Mutchler is reminded of the importance of commitment to daily physical activity for 30 minutes or more, as able and the need to limit carbohydrate intake to 30 to 60 grams per meal to help with blood sugar control.   The need to take medication as prescribed, test blood sugar as directed, and to call between visits if there is a concern that blood sugar is uncontrolled is also discussed.   Ms. Hirano is reminded of the importance of daily foot exam, annual eye examination, and good blood sugar, blood pressure and cholesterol control.  Diabetic Labs Latest Ref Rng & Units 08/28/2015 05/26/2015 05/19/2015 09/05/2014 06/02/2014  HbA1c <5.7 % - 9.1(H) - 11.4(H) 10.9(H)  Microalbumin Not Estab. ug/mL 39.6(H) - - - 0.6  Micro/Creat Ratio 0.0 - 30.0 mg/g creat 29.2 - - - 22.1  Chol 125 - 200 mg/dL - 112(L) - - 142  HDL >=46 mg/dL - 35(L) - - 51  Calc LDL <130 mg/dL - 57 - - 70  Triglycerides <150 mg/dL - 101 - - 106  Creatinine 0.44 - 1.00 mg/dL - - 0.77 0.75 0.77   BP/Weight 10/01/2015 08/27/2015 05/26/2015 05/19/2015 12/25/2014 [id number] 0000000  Systolic BP 123XX123 Q000111Q 123XX123 Q000111Q 123XX123 123XX123 123XX123  Diastolic BP 82 94 74 A999333 74 84 82  Wt. (Lbs) 211.4 213 208 222 221 222 218  BMI 37.45 37.74 36.85 40.59 39.16 39.34 38.63   Foot/eye exam completion dates Latest Ref Rng & Units 08/27/2015 06/19/2014  Eye Exam No Retinopathy - No Retinopathy  Foot exam Order - - -  Foot Form Completion - Done -   Will need to use OTC insulin as cost is prohibitive pof any other, will decide on dose once lab is  upadted

## 2015-10-01 NOTE — Assessment & Plan Note (Signed)
Controlled, no change in medication Sleep hygiene reviewed and written information offered also. Prescription sent for  medication needed.  

## 2015-10-01 NOTE — Assessment & Plan Note (Signed)
An ongoing chanllenge , specifically with regard to diabetes control, the hope is that this will improve, she understands the need to comply with both diet and medication

## 2015-10-01 NOTE — Assessment & Plan Note (Signed)
Deteriorated. Patient re-educated about  the importance of commitment to a  minimum of 150 minutes of exercise per week.  The importance of healthy food choices with portion control discussed. Encouraged to start a food diary, count calories and to consider  joining a support group. Sample diet sheets offered. Goals set by the patient for the next several months.   Weight /BMI 10/01/2015 08/27/2015 05/26/2015  WEIGHT 211 lb 6.4 oz 213 lb 208 lb  HEIGHT 5\' 3"  5\' 3"  5\' 3"   BMI 37.45 kg/m2 37.74 kg/m2 36.85 kg/m2

## 2015-10-01 NOTE — Patient Instructions (Signed)
Annual physical exam in 3.5 month, call if you need me sooner  Labs today  Fasting labs in 3.5 months 3 days prior to follow up  In diabetes, unless you control your eating habits, controlling your blood sugar is IMPOSSIBLE, please work seriously on this to protect and improve your health  Call by next week Monday, speak with nurse about the blood result and ionsulin to be used  Goal for fasting blood sugar ranges from 80 to 120 and 2 hours after any meal or at bedtime should be between 130 to 170. Test and record 3 to 4 times daily, call weekly with results and discuss with Brandi  Blood pressure is excellent  It is important that you exercise regularly at least 30 minutes 5 times a week. If you develop chest pain, have severe difficulty breathing, or feel very tired, stop exercising immediately and seek medical attention    You are referred for eye exam  Please work on good  health habits so that your health will improve. 1. Commitment to daily physical activity for 30 to 60  minutes, if you are able to do this.  2. Commitment to wise food choices. Aim for half of your  food intake to be vegetable and fruit, one quarter starchy foods, and one quarter protein. Try to eat on a regular schedule  3 meals per day, snacking between meals should be limited to vegetables or fruits or small portions of nuts. 64 ounces of water per day is generally recommended, unless you have specific health conditions, like heart failure or kidney failure where you will need to limit fluid intake.  3. Commitment to sufficient and a  good quality of physical and mental rest daily, generally between 6 to 8 hours per day.  WITH PERSISTANCE AND PERSEVERANCE, THE IMPOSSIBLE , BECOMES THE NORM! Thank you  for choosing [facility]. We consider it a privelige to serve you.  Delivering excellent health care in a caring and  compassionate way is our goal.  Partnering with you,  so that together we can  achieve this goal is our strategy.

## 2015-10-02 ENCOUNTER — Telehealth: Payer: Self-pay

## 2015-10-02 LAB — HEMOGLOBIN A1C
Hgb A1c MFr Bld: 11.7 % — ABNORMAL HIGH (ref <5.7)
Mean Plasma Glucose: 289 mg/dL

## 2015-10-02 LAB — BASIC METABOLIC PANEL WITH GFR
BUN: 8 mg/dL (ref 7–25)
CO2: 27 mmol/L (ref 20–31)
Calcium: 10.1 mg/dL (ref 8.6–10.4)
Chloride: 99 mmol/L (ref 98–110)
Creat: 0.88 mg/dL (ref 0.60–0.93)
GFR, EST NON AFRICAN AMERICAN: 67 mL/min (ref >=60)
GFR, Est African American: 77 mL/min (ref >=60)
GLUCOSE: 408 mg/dL — AB (ref 65–99)
POTASSIUM: 4.9 mmol/L (ref 3.5–5.3)
Sodium: 137 mmol/L (ref 135–146)

## 2015-10-02 NOTE — Telephone Encounter (Signed)
Glucose was 408 H repeated and verified. Called in from Cassandra at [place]

## 2015-10-04 ENCOUNTER — Other Ambulatory Visit: Payer: Self-pay | Admitting: Family Medicine

## 2015-10-04 DIAGNOSIS — E1310 Other specified diabetes mellitus with ketoacidosis without coma: Secondary | ICD-10-CM

## 2015-10-04 MED ORDER — INSULIN NPH (HUMAN) (ISOPHANE) 100 UNIT/ML ~~LOC~~ SUSP
10.0000 [IU] | Freq: Two times a day (BID) | SUBCUTANEOUS | Status: DC
Start: 2015-10-05 — End: 2016-01-22

## 2015-10-05 ENCOUNTER — Other Ambulatory Visit: Payer: Self-pay

## 2015-10-05 ENCOUNTER — Other Ambulatory Visit: Payer: Self-pay | Admitting: *Deleted

## 2015-10-05 DIAGNOSIS — E118 Type 2 diabetes mellitus with unspecified complications: Principal | ICD-10-CM

## 2015-10-05 DIAGNOSIS — IMO0002 Reserved for concepts with insufficient information to code with codable children: Secondary | ICD-10-CM

## 2015-10-05 DIAGNOSIS — E1165 Type 2 diabetes mellitus with hyperglycemia: Secondary | ICD-10-CM | POA: Insufficient documentation

## 2015-10-05 MED ORDER — INSULIN NPH (HUMAN) (ISOPHANE) 100 UNIT/ML ~~LOC~~ SUSP: 10.0000 [IU] | Freq: Two times a day (BID) | SUBCUTANEOUS | 11 refills | Status: DC

## 2015-10-05 MED ORDER — INSULIN SYRINGES (DISPOSABLE) U-100 1 ML MISC: 11 refills | Status: DC

## 2015-10-05 NOTE — Telephone Encounter (Signed)
Patient aware and this was explained but she still said that she would be unable to get the med until mid august.

## 2015-10-05 NOTE — Telephone Encounter (Signed)
pls see her result note for her to start long acting relion etc, she continues current meds, explain how vERY uncontrolled her blood sugar is and the danger involved

## 2015-10-05 NOTE — Patient Outreach (Signed)
[place] [facility]) Care Management  10/05/2015  CHARNIECE KWIAT 01-30-1946 [id number]  Referral from MD-severely uncontrolled DM; needs assistance with obtaining insulin:  Telephone call to patient; line rings busy x 2 attempts.  Plan: Will follow up.  Sherrin Daisy, RN BSN [place] Management Coordinator [facility] Care Management  [phone]

## 2015-10-06 ENCOUNTER — Other Ambulatory Visit: Payer: Self-pay | Admitting: *Deleted

## 2015-10-06 NOTE — Patient Outreach (Signed)
[place] [facility]) Care Management  10/06/2015  Tiffany Jacobs [birth year] [id number]   Telephone call to patient. No answer and unable to leave message.  Plan: Will follow up.  Sherrin Daisy, RN BSN [place] Management Coordinator [facility] Care Management  [phone]

## 2015-10-07 ENCOUNTER — Other Ambulatory Visit: Payer: Self-pay | Admitting: *Deleted

## 2015-10-07 NOTE — Patient Outreach (Signed)
[place] [facility]) Care Management  10/07/2015  KHALIE PETRONELLA [birth year] [id number]  Telephone call to patient who was advised of reason for call. Patient voices that she is currently getting ready to leave for appointment and will not be able to talk with this RN CM at this time.  Patient voices she can take call at another time.  Plan: Appointment set for screening assessment with patient agreement.   Sherrin Daisy, RN BSN [place] Management Coordinator [facility] Care Management  [phone]

## 2015-10-08 ENCOUNTER — Other Ambulatory Visit: Payer: Self-pay | Admitting: *Deleted

## 2015-10-08 DIAGNOSIS — E669 Obesity, unspecified: Principal | ICD-10-CM

## 2015-10-08 DIAGNOSIS — E1169 Type 2 diabetes mellitus with other specified complication: Secondary | ICD-10-CM

## 2015-10-08 NOTE — Patient Outreach (Signed)
[place] [facility]) Care Management  10/08/2015  Tiffany Jacobs 08-Sep-1945 [id number]  Telephone call to patient; no answer; unable to leave message.  Return call from patient; patient advised of [facility] care management services and agrees to screening assessment.   Patient voices that major health concerns are uncontrolled diabetes, being overweight(211 lbs, 5'2.5"), and nagging persistent cough, trouble with exercising due to feet & leg pain. States last blood sugar at MD office was over 400 and A1c was 11.7.  States she is on oral medication for diabetes and MD restarted insulin. States her blood sugar this AM before breakfast was 265. States MD office called her prescription for insulin to mail order pharmacy and she will start when medication is delivered. States it usually takes several days for delivery.  Patient voices that she has been on insulin before and knows how to administer. Has glucometer & should be checking blood sugar 3 times daily but sometimes forgets 2nd & third time but normally checks at least 1 time before breakfast. States unable to stand or walk for long periods due to pain legs & feet.   Patient voices that she manages own medications & knows why she is taking her medications. Patient was advised of  importance of taking medications as prescribed by MD consistently. Patient voices understanding.     Patient voices that she is independent & drives self to MD appointments. States husband is supportive. Patient is requesting [facility] services and wants help with managing her uncontrolled diabetes.   Plan;  Send to care management assistant to assign to [facility] care coordinator for complex case management. Assign to pharmacy to answer questions regarding possible side effects of medications.  Sherrin Daisy, RN BSN [place] Management Coordinator [facility] Care Management  [phone]

## 2015-10-13 ENCOUNTER — Other Ambulatory Visit: Payer: Self-pay | Admitting: *Deleted

## 2015-10-13 NOTE — Patient Outreach (Signed)
Call to patient regarding RN CM community referral.  Spoke with patient and scheduled visit for initial assessment for next week. Plan for visit next week. Tiffany Jacobs. [name], RN, BSN, [place] [phone]

## 2015-10-20 ENCOUNTER — Ambulatory Visit: Payer: Self-pay | Admitting: *Deleted

## 2015-10-27 ENCOUNTER — Encounter: Payer: Self-pay | Admitting: *Deleted

## 2015-10-27 ENCOUNTER — Telehealth: Payer: Self-pay | Admitting: Family Medicine

## 2015-10-27 ENCOUNTER — Other Ambulatory Visit: Payer: Self-pay | Admitting: *Deleted

## 2015-10-27 NOTE — Telephone Encounter (Signed)
Note written and sent.

## 2015-10-27 NOTE — Telephone Encounter (Signed)
Brandi  Please address correct insulin etc from the note I forwarded from [facility] Any questions, pls ask me

## 2015-10-27 NOTE — Patient Outreach (Signed)
[facility]) Care Management   10/27/2015  Tiffany Jacobs [birth year] [id number]  Tiffany Jacobs is an 70 y.o. female  Subjective:  Patient states she is doing well today.  Patient reports she does not eat 3 meals a day, she states she has a poor diet due to money concerns. She does not want a referral to [place] at this time states she is aware of the local food pantry programs. Patient states she was to be given insulin but has not heard from MD office or pharmacy, she is not sure if they were waiting for her to call up there with her blood sugar readings or not. Patient states Dr. Moshe Cipro is monitoring her diabetes as she cannot afford to see specialist due to $45 co-Pay Patient aware of her A1C being elevated. Patient is exercising at home, does not go to [facility] for formal exercise.  Patient states she appreciates any education she can get to help get her blood sugars under control. Patient states she does not want to take insulin but is willing to take it, "if it will help my diabetes"  Objective:   Patient neatly groomed and dressed. Home clean BP 138/70   Pulse 98   Resp 20   Wt 209 lb (94.8 kg)   SpO2 96%   BMI 37.02 kg/m  Review of Systems  Constitutional: Negative.   HENT: Negative.   Eyes: Negative.   Respiratory: Positive for cough.        Dry cough  Cardiovascular: Negative.   Gastrointestinal: Negative.   Genitourinary: Negative.   Musculoskeletal: Negative.   Skin: Negative.   Neurological: Negative.   Endo/Heme/Allergies: Negative.   Psychiatric/Behavioral: Negative.     Physical Exam  Constitutional: She appears well-developed and well-nourished.  Cardiovascular: Normal rate.   Respiratory: Effort normal.  GI: Soft. Bowel sounds are normal.  Musculoskeletal: Normal range of motion.  Neurological: She is alert.  Skin: Skin is warm and dry.  Psychiatric: She has a normal mood and affect.    Encounter Medications:    Outpatient Encounter Prescriptions as of 10/27/2015  Medication Sig Note  . acetaminophen (TYLENOL) 650 MG CR tablet Take 650 mg by mouth every 8 (eight) hours as needed for pain. Reported on 08/27/2015   . albuterol (PROVENTIL HFA;VENTOLIN HFA) 108 (90 BASE) MCG/ACT inhaler Inhale 2 puffs into the lungs every 6 (six) hours as needed for wheezing or shortness of breath.   Marland Kitchen albuterol (PROVENTIL) (2.5 MG/3ML) 0.083% nebulizer solution Take 2.5 mg by nebulization every 6 (six) hours as needed for wheezing or shortness of breath.   Marland Kitchen amLODipine (NORVASC) 10 MG tablet Take 1 tablet (10 mg total) by mouth daily.   Marland Kitchen aspirin (ASPIRIN LOW DOSE) 81 MG EC tablet Take 81 mg by mouth every morning.    . fluticasone (FLONASE) 50 MCG/ACT nasal spray Place 2 sprays into both nostrils daily.   Marland Kitchen gabapentin (NEURONTIN) 300 MG capsule Take 1 capsule (300 mg total) by mouth at bedtime.   Marland Kitchen glipiZIDE (GLUCOTROL XL) 10 MG 24 hr tablet Take 2 tablets (20 mg total) by mouth daily with breakfast.   . glucose blood (ONETOUCH VERIO) test strip Use as instructed three times daily dx E11.65 10/27/2015: True metric  . hydrALAZINE (APRESOLINE) 25 MG tablet Take 1 tablet (25 mg total) by mouth 3 (three) times daily.   . metFORMIN (GLUCOPHAGE) 1000 MG tablet Take 1 tablet (1,000 mg total) by mouth 2 (two) times daily with a  meal.   . montelukast (SINGULAIR) 10 MG tablet Take 1 tablet (10 mg total) by mouth at bedtime.   Tiffany Jacobs DELICA LANCETS [id number] MISC Three times daily testing dx E11.65   . spironolactone (ALDACTONE) 100 MG tablet Take 1 tablet (100 mg total) by mouth daily.   Marland Kitchen zolpidem (AMBIEN) 10 MG tablet TAKE (1) TABLET BY MOUTH AT BEDTIME AS NEEDED FOR SLEEP.   Marland Kitchen insulin NPH Human (HUMULIN N,NOVOLIN N) 100 UNIT/ML injection Inject 0.1 mLs (10 Units total) into the skin 2 (two) times daily before a meal. (Patient not taking: Reported on 10/27/2015) 10/27/2015: Does not have any insulin, she states she cannot afford  .  Insulin Syringes, Disposable, U-100 1 ML MISC For use with relion insulin twice daily (Patient not taking: Reported on 10/27/2015)   . pravastatin (PRAVACHOL) 20 MG tablet Take 1 tablet (20 mg total) by mouth daily. (Patient not taking: Reported on 10/27/2015) 10/27/2015: Has not been taking  . sertraline (ZOLOFT) 50 MG tablet Take 1 tablet (50 mg total) by mouth daily. (Patient not taking: Reported on 10/27/2015)    Facility-Administered Encounter Medications as of 10/27/2015  Medication  . insulin NPH Human (HUMULIN N,NOVOLIN N) injection 10 Units      Functional Status Survey: Is the patient deaf or have difficulty hearing?: No Does the patient have difficulty seeing, even when wearing glasses/contacts?: No Does the patient have difficulty concentrating, remembering, or making decisions?: No Does the patient have difficulty walking or climbing stairs?: No Does the patient have difficulty dressing or bathing?: No Does the patient have difficulty doing errands alone such as visiting a doctor's office or shopping?: No Fall/Depression Screening:    PHQ 2/9 Scores 10/08/2015 08/27/2015 12/25/2014 10/30/2014 11/04/2013 01/18/2013 10/05/2012  PHQ - 2 Score 0 0 0 5 0 2 0  PHQ- 9 Score - - 1 19 - 10 -    Assessment:   Diabetes: AIC elevated. Patient needs lots of education around diet and medications. EMMI education and CHO diet tear off sheet given and reviewed with patient this visit. Reviewed AVS from Last office visit with Dr. Moshe Cipro In basket message to MD regarding insulin prescription Reviewed diet and adding protein to meals. Offered to refer to SW for food pantry information, patient refused this visit.  Plan:  Gave patient med box to assist with medication management Will follow up with patient after hearing back from MD. Will follow up with [place] regarding insulin cost to check on any assistance patient may qualify for. Will visit again next month.    Royetta Crochet. [name], RN, BSN,  [place] [phone]

## 2015-10-28 ENCOUNTER — Other Ambulatory Visit: Payer: Self-pay | Admitting: *Deleted

## 2015-10-28 ENCOUNTER — Encounter: Payer: Self-pay | Admitting: *Deleted

## 2015-10-28 ENCOUNTER — Other Ambulatory Visit: Payer: Self-pay

## 2015-10-28 ENCOUNTER — Other Ambulatory Visit: Payer: Self-pay | Admitting: Pharmacist

## 2015-10-28 MED ORDER — INSULIN NPH (HUMAN) (ISOPHANE) 100 UNIT/ML ~~LOC~~ SUSP: 10.0000 [IU] | Freq: Two times a day (BID) | SUBCUTANEOUS | 11 refills | Status: DC

## 2015-10-28 NOTE — Patient Outreach (Signed)
[place] [facility]) Care Management  10/28/2015  Tiffany Jacobs [birth year] [id number]  Patient referred to [place] by Vaughan Basta [facility] RN Telephonic.    Unsuccessful attempt to reach patient.    Left HIPAA compliant voicemail requesting a return call.   Plan:  Will make another attempt within the next week if no return call.   Karrie Meres, PharmD, [place] [phone]

## 2015-10-28 NOTE — Telephone Encounter (Signed)
Concern has been addressed. °

## 2015-10-28 NOTE — Patient Outreach (Signed)
Message received from MD office regarding Insulin question. Call to patient to review information. No answer, left VM with RNCM contact requesting call back. Plan to follow up with patient this week. Royetta Crochet. [name], RN, BSN, [place] [phone]

## 2015-10-29 ENCOUNTER — Other Ambulatory Visit: Payer: Self-pay | Admitting: *Deleted

## 2015-10-29 NOTE — Patient Outreach (Signed)
Outreach call to patient  No answer, left message requesting call back. Plan to continue to outreach patient  Tiffany Jacobs. [name], RN, BSN, [place] [phone]

## 2015-11-02 ENCOUNTER — Other Ambulatory Visit: Payer: Self-pay | Admitting: *Deleted

## 2015-11-02 NOTE — Patient Outreach (Signed)
Call to patient, patient spouse answered, states patient not home.  RNCM requested call back. Plan to await call back.  Royetta Crochet. [name], RN, BSN, [place] [phone]

## 2015-11-03 ENCOUNTER — Other Ambulatory Visit: Payer: Self-pay | Admitting: Pharmacist

## 2015-11-03 ENCOUNTER — Telehealth: Payer: Self-pay

## 2015-11-03 NOTE — Patient Outreach (Signed)
[facility]) Care Management  11/03/2015  Tiffany Jacobs Apr 09, 1945 [id number]  Second unsuccessful phone outreach attempt to reach patient.   No answer, left a HIPAA compliant voicemail requesting a return call.   Plan:  Will make a third attempt to reach patient within the next week if no return call from patient.   Karrie Meres, PharmD, [place] [phone]

## 2015-11-03 NOTE — Telephone Encounter (Signed)
I spoke with the patient and she stated that she still had not started the insulin. I explained to her that we were concerned about her health and the effects of her blood sugar staying so high. I reminded her that the insulin at [facility] was only $20 where the insulin through her mail order was $200. She was grateful that we were trying to get her cheaper medication but she said it was STILL too expensive for her. Doesn't qualify for [place] assistance either.

## 2015-11-03 NOTE — Telephone Encounter (Signed)
Noted , we can do no more I believe, encourage her to work hard on correct eating nhabits because that can be evben more effective than medication  At times with blood sugar

## 2015-11-04 ENCOUNTER — Other Ambulatory Visit: Payer: Self-pay | Admitting: *Deleted

## 2015-11-04 NOTE — Patient Outreach (Signed)
Call to patient, no answer, left message requesting call back to [facility] If no response will unable to reach letter. Royetta Crochet. [name], RN, BSN, [place] [phone]

## 2015-11-05 NOTE — Telephone Encounter (Signed)
States she is working hard on her dietary changes

## 2015-11-06 ENCOUNTER — Encounter: Payer: Self-pay | Admitting: Pharmacist

## 2015-11-06 ENCOUNTER — Other Ambulatory Visit: Payer: Self-pay | Admitting: Pharmacist

## 2015-11-06 NOTE — Patient Outreach (Signed)
[facility]) Care Management  11/06/2015  Tiffany Jacobs 1945/04/03 [id number]  Third unsuccessful phone outreach attempt to patient.   No answer, left a HIPAA compliant voicemail requesting a return call.   Plan:  Given this was three unsuccessful outreach attempts, will mail patient an Economist.  If no reply from patient within 10 business will close pharmacy case.   Karrie Meres, PharmD, [place] [phone]

## 2015-11-17 ENCOUNTER — Encounter: Payer: Self-pay | Admitting: *Deleted

## 2015-11-26 ENCOUNTER — Encounter: Payer: Self-pay | Admitting: *Deleted

## 2015-11-26 NOTE — Patient Outreach (Signed)
RNCM did not get response from unable to reach letter. Plan to close case per protocol. Royetta Crochet. [name], RN, BSN, [place] [phone]

## 2015-12-02 ENCOUNTER — Other Ambulatory Visit: Payer: Self-pay | Admitting: Pharmacist

## 2015-12-02 NOTE — Patient Outreach (Signed)
[facility]) Care Management  12/02/2015  Tiffany Jacobs Jan 11, 1946 [id number]  Three unsuccessful phone outreach attempts were made and an outreach letter was mailed to patient with no response.    Will close pharmacy case due to unable to contact patient.   [place] Coordinator, Merrily Brittle, has also closed case and sent MD and patient case closure letters per chart review.   Plan:  Pharmacy case closed due to unable to contact patient.   Karrie Meres, PharmD, [place] [phone]

## 2015-12-03 ENCOUNTER — Other Ambulatory Visit: Payer: Self-pay | Admitting: Family Medicine

## 2015-12-03 DIAGNOSIS — H40033 Anatomical narrow angle, bilateral: Secondary | ICD-10-CM | POA: Diagnosis not present

## 2015-12-03 DIAGNOSIS — H40053 Ocular hypertension, bilateral: Secondary | ICD-10-CM | POA: Diagnosis not present

## 2015-12-11 ENCOUNTER — Other Ambulatory Visit: Payer: Self-pay | Admitting: Family Medicine

## 2015-12-11 DIAGNOSIS — J3089 Other allergic rhinitis: Secondary | ICD-10-CM

## 2015-12-12 ENCOUNTER — Other Ambulatory Visit: Payer: Self-pay | Admitting: Family Medicine

## 2015-12-12 DIAGNOSIS — E118 Type 2 diabetes mellitus with unspecified complications: Principal | ICD-10-CM

## 2015-12-12 DIAGNOSIS — E1165 Type 2 diabetes mellitus with hyperglycemia: Secondary | ICD-10-CM

## 2016-01-01 ENCOUNTER — Other Ambulatory Visit: Payer: Self-pay

## 2016-01-01 MED ORDER — ZOLPIDEM TARTRATE 10 MG PO TABS: 10.0000 mg | ORAL_TABLET | Freq: Every evening | ORAL | 0 refills | Status: DC | PRN

## 2016-01-18 DIAGNOSIS — E669 Obesity, unspecified: Secondary | ICD-10-CM | POA: Diagnosis not present

## 2016-01-18 DIAGNOSIS — E119 Type 2 diabetes mellitus without complications: Secondary | ICD-10-CM | POA: Diagnosis not present

## 2016-01-18 DIAGNOSIS — E785 Hyperlipidemia, unspecified: Secondary | ICD-10-CM | POA: Diagnosis not present

## 2016-01-18 LAB — HEMOGLOBIN A1C
Hgb A1c MFr Bld: 11.1 % — ABNORMAL HIGH (ref <5.7)
MEAN PLASMA GLUCOSE: 272 mg/dL

## 2016-01-19 LAB — COMPLETE METABOLIC PANEL WITH GFR
ALT: 13 U/L (ref 6–29)
AST: 15 U/L (ref 10–35)
Albumin: 3.8 g/dL (ref 3.6–5.1)
Alkaline Phosphatase: 82 U/L (ref 33–130)
BILIRUBIN TOTAL: 0.5 mg/dL (ref 0.2–1.2)
BUN: 9 mg/dL (ref 7–25)
CHLORIDE: 101 mmol/L (ref 98–110)
CO2: 28 mmol/L (ref 20–31)
Calcium: 9.8 mg/dL (ref 8.6–10.4)
Creat: 0.82 mg/dL (ref 0.60–0.93)
GFR, EST AFRICAN AMERICAN: 84 mL/min (ref >=60)
GFR, EST NON AFRICAN AMERICAN: 73 mL/min (ref >=60)
GLUCOSE: 216 mg/dL — AB (ref 65–99)
Potassium: 4.9 mmol/L (ref 3.5–5.3)
SODIUM: 137 mmol/L (ref 135–146)
TOTAL PROTEIN: 6.8 g/dL (ref 6.1–8.1)

## 2016-01-19 LAB — LIPID PANEL
Cholesterol: 129 mg/dL (ref <200)
HDL: 41 mg/dL — AB (ref >50)
LDL CALC: 66 mg/dL (ref <100)
TRIGLYCERIDES: 109 mg/dL (ref <150)
Total CHOL/HDL Ratio: 3.1 Ratio (ref <5.0)
VLDL: 22 mg/dL (ref <30)

## 2016-01-21 ENCOUNTER — Other Ambulatory Visit: Payer: Self-pay

## 2016-01-21 MED ORDER — ZOLPIDEM TARTRATE 10 MG PO TABS: 10.0000 mg | ORAL_TABLET | Freq: Every evening | ORAL | 0 refills | Status: DC | PRN

## 2016-01-22 ENCOUNTER — Ambulatory Visit (INDEPENDENT_AMBULATORY_CARE_PROVIDER_SITE_OTHER): Payer: Commercial Managed Care - HMO | Admitting: Family Medicine

## 2016-01-22 ENCOUNTER — Other Ambulatory Visit (HOSPITAL_COMMUNITY)
Admission: RE | Admit: 2016-01-22 | Discharge: 2016-01-22 | Disposition: A | Discharge status: Home / Self Care | Payer: Commercial Managed Care - HMO | Source: Ambulatory Visit | Attending: Family Medicine | Admitting: Family Medicine

## 2016-01-22 ENCOUNTER — Encounter: Payer: Self-pay | Admitting: Family Medicine

## 2016-01-22 VITALS — BP 124/80 | HR 100 | Resp 18 | Ht 63.0 in | Wt 212.1 lb

## 2016-01-22 DIAGNOSIS — Z0000 Encounter for general adult medical examination without abnormal findings: Secondary | ICD-10-CM | POA: Diagnosis not present

## 2016-01-22 DIAGNOSIS — E1165 Type 2 diabetes mellitus with hyperglycemia: Secondary | ICD-10-CM

## 2016-01-22 DIAGNOSIS — Z1211 Encounter for screening for malignant neoplasm of colon: Secondary | ICD-10-CM

## 2016-01-22 DIAGNOSIS — Z124 Encounter for screening for malignant neoplasm of cervix: Secondary | ICD-10-CM | POA: Diagnosis not present

## 2016-01-22 DIAGNOSIS — E669 Obesity, unspecified: Secondary | ICD-10-CM | POA: Diagnosis not present

## 2016-01-22 DIAGNOSIS — Z01419 Encounter for gynecological examination (general) (routine) without abnormal findings: Secondary | ICD-10-CM

## 2016-01-22 DIAGNOSIS — I10 Essential (primary) hypertension: Secondary | ICD-10-CM

## 2016-01-22 DIAGNOSIS — E785 Hyperlipidemia, unspecified: Secondary | ICD-10-CM | POA: Diagnosis not present

## 2016-01-22 DIAGNOSIS — E118 Type 2 diabetes mellitus with unspecified complications: Secondary | ICD-10-CM

## 2016-01-22 DIAGNOSIS — E1169 Type 2 diabetes mellitus with other specified complication: Secondary | ICD-10-CM

## 2016-01-22 DIAGNOSIS — Z23 Encounter for immunization: Secondary | ICD-10-CM

## 2016-01-22 LAB — POC HEMOCCULT BLD/STL (OFFICE/1-CARD/DIAGNOSTIC): Fecal Occult Blood, POC: NEGATIVE

## 2016-01-22 MED ORDER — GLIPIZIDE ER 10 MG PO TB24: 20.0000 mg | ORAL_TABLET | Freq: Every day | ORAL | 1 refills | Status: DC

## 2016-01-22 MED ORDER — INSULIN NPH (HUMAN) (ISOPHANE) 100 UNIT/ML ~~LOC~~ SUSP: 10.0000 [IU] | Freq: Two times a day (BID) | SUBCUTANEOUS | 11 refills | Status: DC

## 2016-01-22 MED ORDER — INSULIN SYRINGES (DISPOSABLE) U-100 1 ML MISC: 11 refills | Status: DC

## 2016-01-22 NOTE — Patient Instructions (Addendum)
F/U IN 3 MONTH, CALL IF YOU NEED ME BEFORE  FLU VACCINE TODAY  NON FAST CBC, HBA1C, CHEM7 AND EGFR , VIT D IN 3 MONTH  Medication for diabetes are glipizide XL 10 mg two daily  Metformin 1000 mg two daily  Insulin nPH 10 units twice daily  Thanks for choosing [facility], we consider it a privelige to serve you.

## 2016-01-24 NOTE — Progress Notes (Signed)
Tiffany Jacobs     MRN: [id number]      DOB: June 25, 1945  HPI: Patient is in for annual physical exam. Uncontrolled diabetes, and ongoing non compliance with diabetes management is addressed Recent labs,  are reviewed and are excellent exept for her diabetes Immunization is reviewed , and  updated if needed.   PE: Pleasant  female, alert and oriented x 3, in no cardio-pulmonary distress. Afebrile. HEENT No facial trauma or asymetry. Sinuses non tender.  Extra occullar muscles intact, pupils equally reactive to light. External ears normal, tympanic membranes clear. Oropharynx moist, no exudate. Neck: supple, no adenopathy,JVD or thyromegaly.No bruits.  Chest: Clear to ascultation bilaterally.No crackles or wheezes. Non tender to palpation  Breast: No asymetry,no masses or lumps. No tenderness. No nipple discharge or inversion. No axillary or supraclavicular adenopathy  Cardiovascular system; Heart sounds normal,  S1 and  S2 ,no S3.  No murmur, or thrill. Apical beat not displaced Peripheral pulses normal.  Abdomen: Soft, non tender, no organomegaly or masses. No bruits. Bowel sounds normal. No guarding, tenderness or rebound.  Rectal:  Normal sphincter tone. No rectal mass. Guaiac negative stool.  GU: External genitalia normal female genitalia , normal female distribution of hair. No lesions. Urethral meatus normal in size, no  Prolapse, no lesions visibly  Present. Bladder non tender. Vagina pink and moist , with no visible lesions , discharge present . Adequate pelvic support no  cystocele or rectocele noted Cervix pink and appears healthy, no lesions or ulcerations noted, no discharge noted from os Uterus normal size, no adnexal masses, no cervical motion or adnexal tenderness.   Musculoskeletal exam: Full ROM of spine, hips , shoulders and knees. No deformity ,swelling or crepitus noted. No muscle wasting or atrophy.   Neurologic: Cranial nerves 2  to 12 intact. Power, tone ,sensation and reflexes normal throughout. No disturbance in gait. No tremor.  Skin: Intact, no ulceration, erythema , scaling or rash noted. Pigmentation normal throughout  Psych; Normal mood and affect. Judgement and concentration normal   Assessment & Plan:  Annual physical exam Annual exam as documented. Counseling done  re healthy lifestyle involving commitment to 150 minutes exercise per week, heart healthy diet, and attaining healthy weight.The importance of adequate sleep also discussed. Regular seat belt use and home safety, is also discussed. Changes in health habits are decided on by the patient with goals and time frames  set for achieving them. Immunization and cancer screening needs are specifically addressed at this visit.   Diabetes mellitus type 2 in obese [facility]) Uncontrolled Re educated re importance of compliance, meds reviewed and any chnages seemed necessary will be delayed until compliant with curent regime, she is to call in [name] is reminded of the importance of commitment to daily physical activity for 30 minutes or more, as able and the need to limit carbohydrate intake to 30 to 60 grams per meal to help with blood sugar control.   The need to take medication as prescribed, test blood sugar as directed, and to call between visits if there is a concern that blood sugar is uncontrolled is also discussed.   [name] is reminded of the importance of daily foot exam, annual eye examination, and good blood sugar, blood pressure and cholesterol control.  Diabetic Labs Latest Ref Rng & Units 01/18/2016 10/01/2015 08/28/2015 05/26/2015 05/19/2015  HbA1c <5.7 % 11.1(H) 11.7(H) - 9.1(H) -  Microalbumin Not Estab. ug/mL - - 39.6(H) - -  Micro/Creat Ratio 0.0 -  30.0 mg/g creat - - 29.2 - -  Chol <200 mg/dL 129 - - 112(L) -  HDL >50 mg/dL 41(L) - - 35(L) -  Calc LDL <100 mg/dL 66 - - 57 -  Triglycerides <150 mg/dL 109 - - 101 -    Creatinine 0.60 - 0.93 mg/dL 0.82 0.88 - - 0.77   BP/Weight 01/22/2016 10/27/2015 10/01/2015 08/27/2015 05/26/2015 05/19/2015 [id number]  Systolic BP A999333 0000000 [id number] Q000111Q 123XX123 Q000111Q [id number]  Diastolic BP 80 70 82 94 74 102 74  Wt. (Lbs) 212.12 209 211.4 213 208 222 221  BMI 37.58 37.02 37.45 37.74 36.85 40.59 39.16   Foot/eye exam completion dates Latest Ref Rng & Units 08/27/2015 06/19/2014  Eye Exam No Retinopathy - No Retinopathy  Foot exam Order - - -  Foot Form Completion - Done -      Updated lab needed at/ before next visit.   Need for prophylactic vaccination and inoculation against influenza After obtaining informed consent, the vaccine is  administered by LPN.

## 2016-01-24 NOTE — Assessment & Plan Note (Signed)
After obtaining informed consent, the vaccine is  administered by LPN.  

## 2016-01-24 NOTE — Assessment & Plan Note (Signed)
Uncontrolled Re educated re importance of compliance, meds reviewed and any chnages seemed necessary will be delayed until compliant with curent regime, she is to call in Tiffany Jacobs is reminded of the importance of commitment to daily physical activity for 30 minutes or more, as able and the need to limit carbohydrate intake to 30 to 60 grams per meal to help with blood sugar control.   The need to take medication as prescribed, test blood sugar as directed, and to call between visits if there is a concern that blood sugar is uncontrolled is also discussed.   Tiffany Jacobs is reminded of the importance of daily foot exam, annual eye examination, and good blood sugar, blood pressure and cholesterol control.  Diabetic Labs Latest Ref Rng & Units 01/18/2016 10/01/2015 08/28/2015 05/26/2015 05/19/2015  HbA1c <5.7 % 11.1(H) 11.7(H) - 9.1(H) -  Microalbumin Not Estab. ug/mL - - 39.6(H) - -  Micro/Creat Ratio 0.0 - 30.0 mg/g creat - - 29.2 - -  Chol <200 mg/dL 129 - - 112(L) -  HDL >50 mg/dL 41(L) - - 35(L) -  Calc LDL <100 mg/dL 66 - - 57 -  Triglycerides <150 mg/dL 109 - - 101 -  Creatinine 0.60 - 0.93 mg/dL 0.82 0.88 - - 0.77   BP/Weight 01/22/2016 10/27/2015 10/01/2015 08/27/2015 05/26/2015 05/19/2015 [id number]  Systolic BP A999333 0000000 123XX123 Q000111Q 123XX123 Q000111Q 123XX123  Diastolic BP 80 70 82 94 74 102 74  Wt. (Lbs) 212.12 209 211.4 213 208 222 221  BMI 37.58 37.02 37.45 37.74 36.85 40.59 39.16   Foot/eye exam completion dates Latest Ref Rng & Units 08/27/2015 06/19/2014  Eye Exam No Retinopathy - No Retinopathy  Foot exam Order - - -  Foot Form Completion - Done -      Updated lab needed at/ before next visit.

## 2016-01-24 NOTE — Assessment & Plan Note (Signed)

## 2016-01-26 LAB — CYTOLOGY - PAP: Diagnosis: NEGATIVE

## 2016-01-27 ENCOUNTER — Encounter: Payer: Self-pay | Admitting: Family Medicine

## 2016-01-27 ENCOUNTER — Telehealth: Payer: Self-pay | Admitting: Family Medicine

## 2016-01-27 NOTE — Telephone Encounter (Signed)
error 

## 2016-03-10 ENCOUNTER — Other Ambulatory Visit: Payer: Self-pay

## 2016-03-10 DIAGNOSIS — M5010 Cervical disc disorder with radiculopathy, unspecified cervical region: Secondary | ICD-10-CM

## 2016-03-10 DIAGNOSIS — J45991 Cough variant asthma: Secondary | ICD-10-CM

## 2016-03-10 DIAGNOSIS — I10 Essential (primary) hypertension: Secondary | ICD-10-CM

## 2016-03-10 MED ORDER — HYDRALAZINE HCL 25 MG PO TABS: 25.0000 mg | ORAL_TABLET | Freq: Three times a day (TID) | ORAL | 1 refills | Status: DC

## 2016-03-10 MED ORDER — PRAVASTATIN SODIUM 20 MG PO TABS: 20.0000 mg | ORAL_TABLET | Freq: Every day | ORAL | 1 refills | Status: DC

## 2016-03-10 MED ORDER — GABAPENTIN 300 MG PO CAPS: 300.0000 mg | ORAL_CAPSULE | Freq: Every day | ORAL | 1 refills | Status: DC

## 2016-03-10 MED ORDER — ACETAMINOPHEN ER 650 MG PO TBCR
650.0000 mg | EXTENDED_RELEASE_TABLET | Freq: Three times a day (TID) | ORAL | 1 refills | Status: DC | PRN
Start: 2016-03-10 — End: 2017-10-16

## 2016-03-10 MED ORDER — MONTELUKAST SODIUM 10 MG PO TABS: 10.0000 mg | ORAL_TABLET | Freq: Every day | ORAL | 1 refills | Status: DC

## 2016-04-01 ENCOUNTER — Other Ambulatory Visit: Payer: Self-pay

## 2016-04-01 MED ORDER — GLUCOSE BLOOD VI STRP: ORAL_STRIP | 5 refills | Status: DC

## 2016-04-02 IMAGING — DX DG CHEST 2V
2 series · 2 of 2 positions shown · non-contrast
Comparison: Chest x-ray dated 05/04/2014

CLINICAL DATA: Cough, fever, shortness of breath and chest pain.
Ex-smoker. History of asthma and diabetes.

EXAM:
CHEST  2 VIEW

[chest pa]
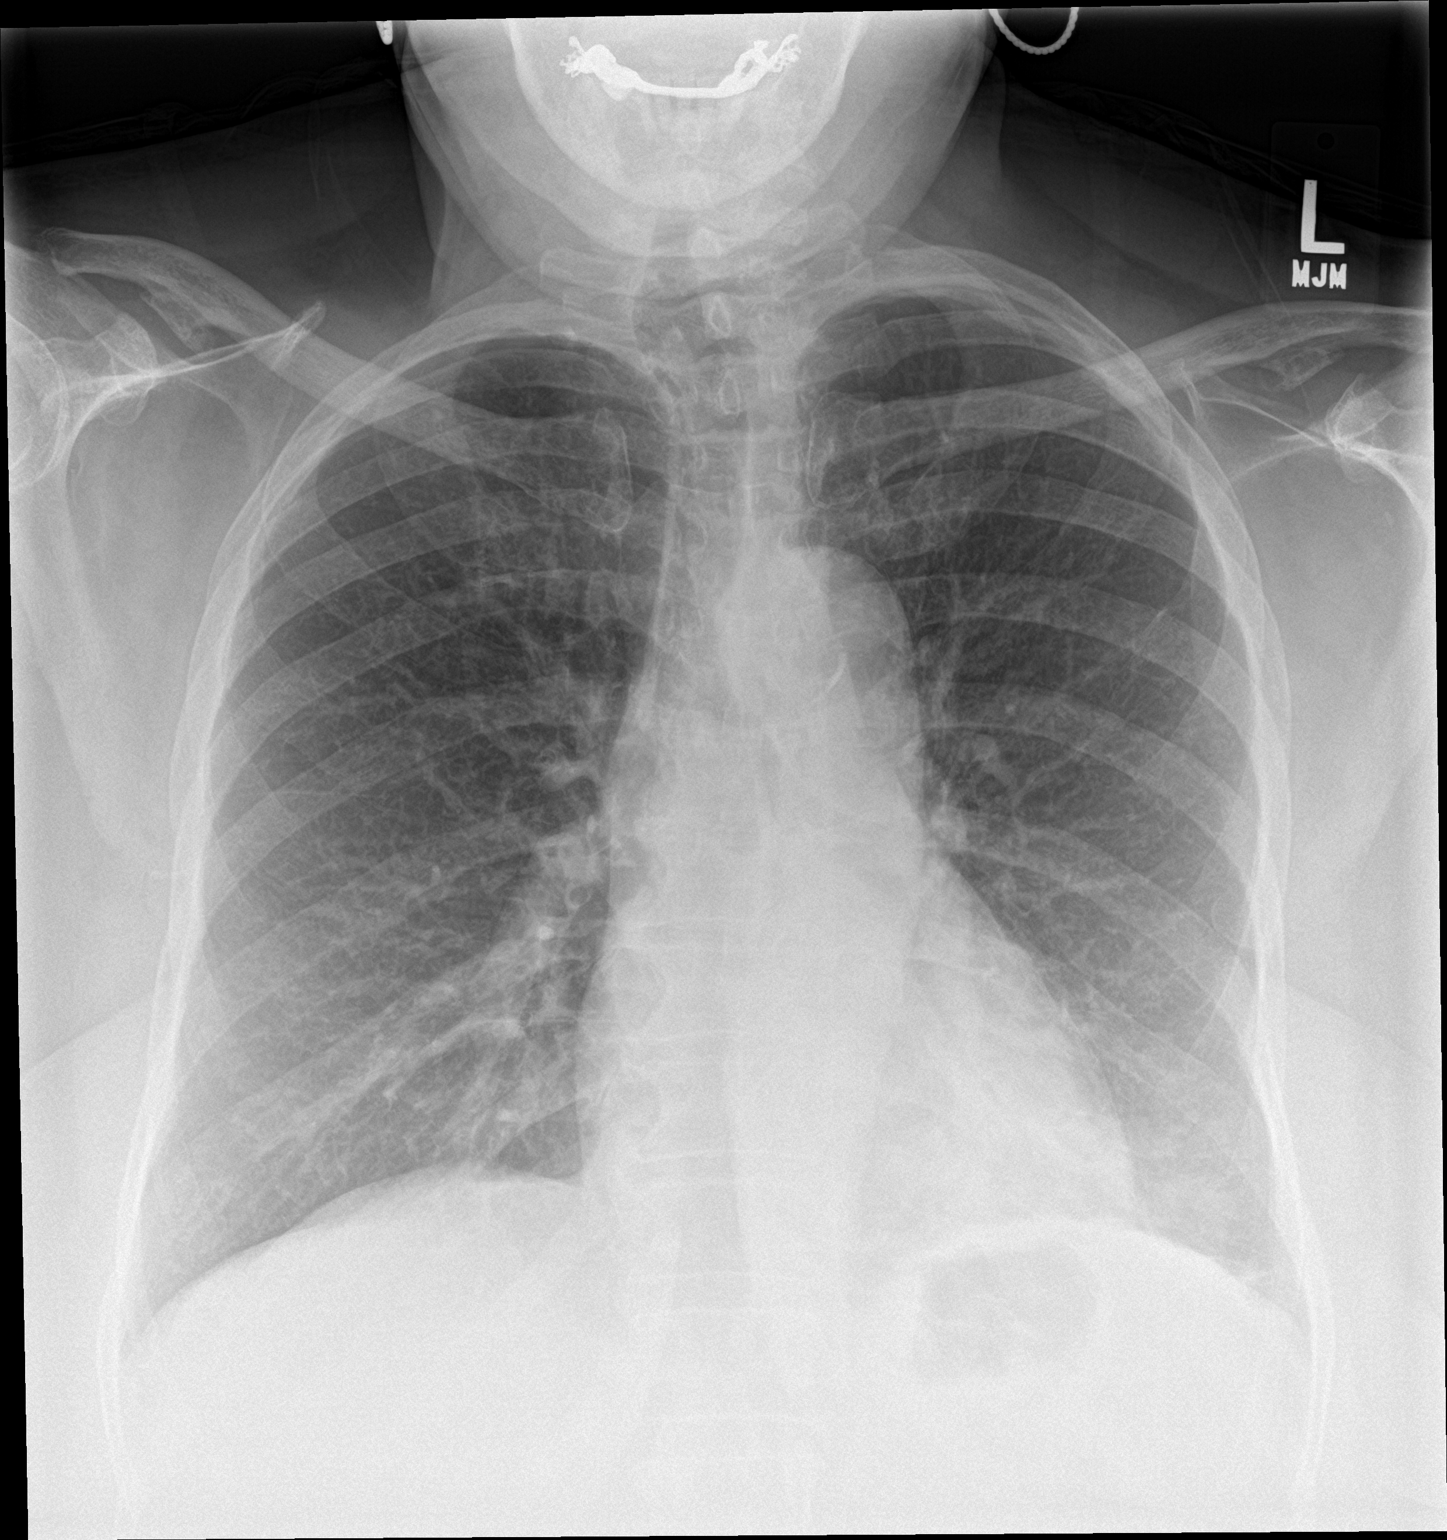

[chest lat]
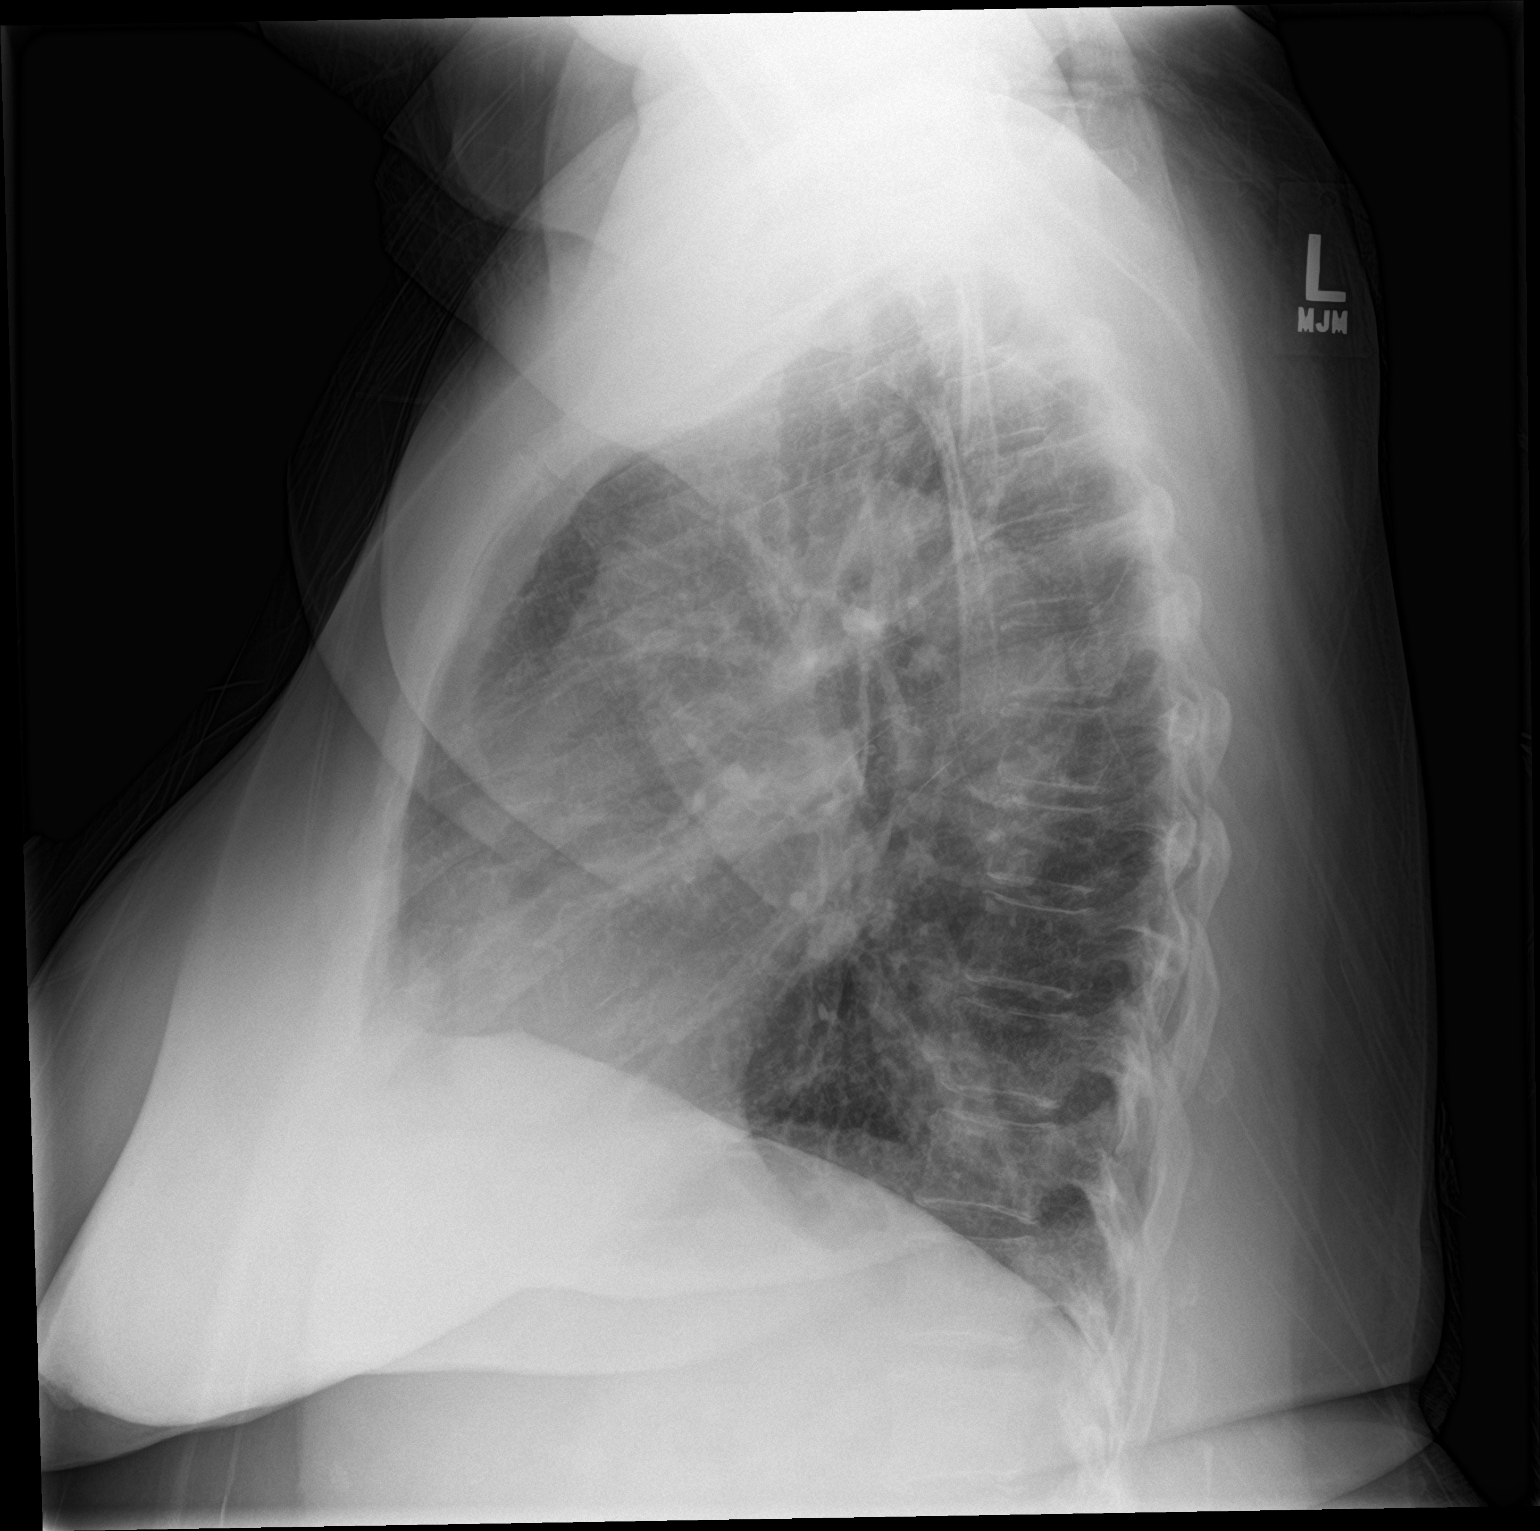

[2 of 2 positions shown; findings below may reference images not displayed]

FINDINGS: Cardiomediastinal silhouette is stable in size and configuration.
Atherosclerotic changes again noted at the aortic arch. Lungs are
clear. No evidence of pneumonia. No pleural effusion. No
pneumothorax seen. Osseous and soft tissue structures about the
chest are unremarkable.
IMPRESSION: No active cardiopulmonary disease.

## 2016-04-07 ENCOUNTER — Other Ambulatory Visit: Payer: Self-pay

## 2016-04-07 MED ORDER — GLUCOSE BLOOD VI STRP: ORAL_STRIP | 1 refills | Status: DC

## 2016-04-18 DIAGNOSIS — E669 Obesity, unspecified: Secondary | ICD-10-CM | POA: Diagnosis not present

## 2016-04-18 DIAGNOSIS — E1169 Type 2 diabetes mellitus with other specified complication: Secondary | ICD-10-CM | POA: Diagnosis not present

## 2016-04-18 DIAGNOSIS — E559 Vitamin D deficiency, unspecified: Secondary | ICD-10-CM | POA: Diagnosis not present

## 2016-04-18 DIAGNOSIS — I10 Essential (primary) hypertension: Secondary | ICD-10-CM | POA: Diagnosis not present

## 2016-04-18 LAB — BASIC METABOLIC PANEL WITH GFR
BUN: 15 mg/dL (ref 7–25)
CO2: 28 mmol/L (ref 20–31)
CREATININE: 0.87 mg/dL (ref 0.60–0.93)
Calcium: 9.9 mg/dL (ref 8.6–10.4)
Chloride: 100 mmol/L (ref 98–110)
GFR, EST AFRICAN AMERICAN: 78 mL/min (ref >=60)
GFR, Est Non African American: 68 mL/min (ref >=60)
GLUCOSE: 386 mg/dL — AB (ref 65–99)
Potassium: 5.1 mmol/L (ref 3.5–5.3)
Sodium: 137 mmol/L (ref 135–146)

## 2016-04-18 LAB — CBC
HCT: 41.3 % (ref 35.0–45.0)
Hemoglobin: 13.3 g/dL (ref 11.7–15.5)
MCH: 27.5 pg (ref 27.0–33.0)
MCHC: 32.2 g/dL (ref 32.0–36.0)
MCV: 85.3 fL (ref 80.0–100.0)
MPV: 10.9 fL (ref 7.5–12.5)
Platelets: 305 10*3/uL (ref 140–400)
RBC: 4.84 MIL/uL (ref 3.80–5.10)
RDW: 14.1 % (ref 11.0–15.0)
WBC: 7.6 10*3/uL (ref 3.8–10.8)

## 2016-04-18 LAB — HEMOGLOBIN A1C
HEMOGLOBIN A1C: 12 % — AB (ref <5.7)
Mean Plasma Glucose: 298 mg/dL

## 2016-04-19 LAB — VITAMIN D 25 HYDROXY (VIT D DEFICIENCY, FRACTURES): Vit D, 25-Hydroxy: 24 ng/mL — ABNORMAL LOW (ref 30–100)

## 2016-04-22 ENCOUNTER — Other Ambulatory Visit: Payer: Self-pay

## 2016-04-22 DIAGNOSIS — E1165 Type 2 diabetes mellitus with hyperglycemia: Secondary | ICD-10-CM

## 2016-04-22 DIAGNOSIS — E118 Type 2 diabetes mellitus with unspecified complications: Principal | ICD-10-CM

## 2016-04-22 MED ORDER — METFORMIN HCL 1000 MG PO TABS: 1000.0000 mg | ORAL_TABLET | Freq: Two times a day (BID) | ORAL | 1 refills | Status: DC

## 2016-04-28 ENCOUNTER — Encounter: Payer: Self-pay | Admitting: Family Medicine

## 2016-04-28 ENCOUNTER — Ambulatory Visit (INDEPENDENT_AMBULATORY_CARE_PROVIDER_SITE_OTHER): Payer: Medicare HMO | Admitting: Family Medicine

## 2016-04-28 VITALS — BP 112/78 | HR 94 | Resp 15 | Ht 63.0 in | Wt 208.4 lb

## 2016-04-28 DIAGNOSIS — Z91199 Patient's noncompliance with other medical treatment and regimen due to unspecified reason: Secondary | ICD-10-CM

## 2016-04-28 DIAGNOSIS — I10 Essential (primary) hypertension: Secondary | ICD-10-CM | POA: Diagnosis not present

## 2016-04-28 DIAGNOSIS — Z9119 Patient's noncompliance with other medical treatment and regimen: Secondary | ICD-10-CM | POA: Diagnosis not present

## 2016-04-28 DIAGNOSIS — E1169 Type 2 diabetes mellitus with other specified complication: Secondary | ICD-10-CM

## 2016-04-28 DIAGNOSIS — E669 Obesity, unspecified: Secondary | ICD-10-CM | POA: Diagnosis not present

## 2016-04-28 DIAGNOSIS — F5104 Psychophysiologic insomnia: Secondary | ICD-10-CM

## 2016-04-28 DIAGNOSIS — E785 Hyperlipidemia, unspecified: Secondary | ICD-10-CM | POA: Diagnosis not present

## 2016-04-28 DIAGNOSIS — IMO0001 Reserved for inherently not codable concepts without codable children: Secondary | ICD-10-CM

## 2016-04-28 MED ORDER — INSULIN NPH (HUMAN) (ISOPHANE) 100 UNIT/ML ~~LOC~~ SUSP: 10.0000 [IU] | Freq: Two times a day (BID) | SUBCUTANEOUS | 11 refills | Status: DC

## 2016-04-28 NOTE — Patient Instructions (Addendum)
F/u May 29 or shortly after  Fasting lipid , cmp and eGFR and HBa1C May 22 or shortly after  Blood pressure and cholesterol are good  You are referred to social worker Amy, she WILL CALL YOU  To provide help with medication that you  Need. pLEASE respond to get the help that you desperately need and which is available  You are referred to diabetic educator , PLEASE go and start writing your food record, only water is allowed as a beverage  Test and record blood sugar levels 3 times daily, blood sugar ranges as bellow are the goal Goal for fasting blood sugar ranges from 80 to 120 and 2 hours after any meal or at bedtime should be between 130 to 170.   It is important that you exercise regularly at least 30 minutes 5 times a week. If you develop chest pain, have severe difficulty breathing, or feel very tired, stop exercising immediately and seek medical attention   Thank you  for choosing [facility]. We consider it a privelige to serve you.  Delivering excellent health care in a caring and  compassionate way is our goal.  Partnering with you,  so that together we can achieve this goal is our strategy.   Please work on good  health habits so that your health will improve. 1. Commitment to daily physical activity for 30 to 60  minutes, if you are able to do this.  2. Commitment to wise food choices. Aim for half of your  food intake to be vegetable and fruit, one quarter starchy foods, and one quarter protein. Try to eat on a regular schedule  3 meals per day, snacking between meals should be limited to vegetables or fruits or small portions of nuts. 64 ounces of water per day is generally recommended, unless you have specific health conditions, like heart failure or kidney failure where you will need to limit fluid intake.  3. Commitment to sufficient and a  good quality of physical and mental rest daily, generally between 6 to 8 hours per day.  WITH PERSISTANCE AND  PERSEVERANCE, THE IMPOSSIBLE , BECOMES THE NORM!

## 2016-04-29 NOTE — Assessment & Plan Note (Signed)
Ongoing challenge as far as diabetes is concerned, will attempt to e engage

## 2016-04-29 NOTE — Assessment & Plan Note (Signed)
Deteriorating due to non compliance and lack of engagement o in her disease, repeated attempt to correct this Tiffany Jacobs is reminded of the importance of commitment to daily physical activity for 30 minutes or more, as able and the need to limit carbohydrate intake to 30 to 60 grams per meal to help with blood sugar control.   The need to take medication as prescribed, test blood sugar as directed, and to call between visits if there is a concern that blood sugar is uncontrolled is also discussed.   Tiffany Jacobs is reminded of the importance of daily foot exam, annual eye examination, and good blood sugar, blood pressure and cholesterol control.  Diabetic Labs Latest Ref Rng & Units 04/18/2016 01/18/2016 10/01/2015 08/28/2015 05/26/2015  HbA1c <5.7 % 12.0(H) 11.1(H) 11.7(H) - 9.1(H)  Microalbumin Not Estab. ug/mL - - - 39.6(H) -  Micro/Creat Ratio 0.0 - 30.0 mg/g creat - - - 29.2 -  Chol <200 mg/dL - 129 - - 112(L)  HDL >50 mg/dL - 41(L) - - 35(L)  Calc LDL <100 mg/dL - 66 - - 57  Triglycerides <150 mg/dL - 109 - - 101  Creatinine 0.60 - 0.93 mg/dL 0.87 0.82 0.88 - -   BP/Weight 04/28/2016 01/22/2016 10/27/2015 10/01/2015 08/27/2015 05/26/2015 Q000111Q  Systolic BP [id number] A999333 0000000 [id number] Q000111Q 123XX123 Q000111Q  Diastolic BP 78 80 70 82 94 74 102  Wt. (Lbs) 208.4 212.12 209 211.4 213 208 222  BMI 36.92 37.58 37.02 37.45 37.74 36.85 40.59   Foot/eye exam completion dates Latest Ref Rng & Units 08/27/2015 06/19/2014  Eye Exam No Retinopathy - No Retinopathy  Foot exam Order - - -  Foot Form Completion - Done -    Refer educator also

## 2016-04-29 NOTE — Addendum Note (Signed)
Addended by: Eual Fines on: 04/29/2016 01:07 PM   Modules accepted: Orders

## 2016-04-29 NOTE — Progress Notes (Signed)
Tiffany Jacobs     MRN: [id number]      DOB: 1945/07/09   HPI Tiffany Jacobs is here for follow up and re-evaluation of chronic medical conditions, medication management and review of any available recent lab and radiology data.  Preventive health is updated, specifically  Cancer screening and Immunization.   The PT denies any adverse reactions to current medications since the last visit.  There are no new concerns.  Remains non compliant with diabetes management as far as medication , testing and diet are concerned, [poiunted out that multiple attempts have been made to assist her , will refer to S/W again   ROS Denies recent fever or chills. Denies sinus pressure, nasal congestion, ear pain or sore throat. Denies chest congestion, productive cough or wheezing. Denies chest pains, palpitations and leg swelling Denies abdominal pain, nausea, vomiting,diarrhea or constipation.   Denies dysuria, frequency, hesitancy or incontinence. Denies joint pain, swelling and limitation in mobility. Denies headaches, seizures, numbness, or tingling. Denies depression, anxiety or insomnia. Denies skin break down or rash.   PE  BP 112/78   Pulse 94   Resp 15   Ht 5\' 3"  (1.6 m)   Wt 208 lb 6.4 oz (94.5 kg)   SpO2 95%   BMI 36.92 kg/m   Patient alert and oriented and in no cardiopulmonary distress.  HEENT: No facial asymmetry, EOMI,   oropharynx pink and moist.  Neck supple no JVD, no mass.  Chest: Clear to auscultation bilaterally.  CVS: S1, S2 no murmurs, no S3.Regular rate.  ABD: Soft non tender.   Ext: No edema  MS: Adequate ROM spine, shoulders, hips and knees.  Skin: Intact, no ulcerations or rash noted.  Psych: Good eye contact, normal affect. Memory intact not anxious or depressed appearing.  CNS: CN 2-12 intact, power,  normal throughout.no focal deficits noted.   Assessment & Plan  Essential hypertension Controlled, no change in medication DASH diet and  commitment to daily physical activity for a minimum of 30 minutes discussed and encouraged, as a part of hypertension management. The importance of attaining a healthy weight is also discussed.  BP/Weight 04/28/2016 01/22/2016 10/27/2015 10/01/2015 08/27/2015 05/26/2015 Q000111Q  Systolic BP XX123456 A999333 0000000 [id number] Q000111Q 123XX123 Q000111Q  Diastolic BP 78 80 70 82 94 74 102  Wt. (Lbs) 208.4 212.12 209 211.4 213 208 222  BMI 36.92 37.58 37.02 37.45 37.74 36.85 40.59       Diabetes mellitus type 2 in obese [facility]) Deteriorating due to non compliance and lack of engagement o in her disease, repeated attempt to correct this Tiffany Jacobs is reminded of the importance of commitment to daily physical activity for 30 minutes or more, as able and the need to limit carbohydrate intake to 30 to 60 grams per meal to help with blood sugar control.   The need to take medication as prescribed, test blood sugar as directed, and to call between visits if there is a concern that blood sugar is uncontrolled is also discussed.   Tiffany Jacobs is reminded of the importance of daily foot exam, annual eye examination, and good blood sugar, blood pressure and cholesterol control.  Diabetic Labs Latest Ref Rng & Units 04/18/2016 01/18/2016 10/01/2015 08/28/2015 05/26/2015  HbA1c <5.7 % 12.0(H) 11.1(H) 11.7(H) - 9.1(H)  Microalbumin Not Estab. ug/mL - - - 39.6(H) -  Micro/Creat Ratio 0.0 - 30.0 mg/g creat - - - 29.2 -  Chol <200 mg/dL - 129 - - 112(L)  HDL >50  mg/dL - 41(L) - - 35(L)  Calc LDL <100 mg/dL - 66 - - 57  Triglycerides <150 mg/dL - 109 - - 101  Creatinine 0.60 - 0.93 mg/dL 0.87 0.82 0.88 - -   BP/Weight 04/28/2016 01/22/2016 10/27/2015 10/01/2015 08/27/2015 05/26/2015 Q000111Q  Systolic BP XX123456 A999333 0000000 123XX123 Q000111Q [id number] Q000111Q  Diastolic BP 78 80 70 82 94 74 102  Wt. (Lbs) 208.4 212.12 209 211.4 213 208 222  BMI 36.92 37.58 37.02 37.45 37.74 36.85 40.59   Foot/eye exam completion dates Latest Ref Rng & Units 08/27/2015 06/19/2014  Eye  Exam No Retinopathy - No Retinopathy  Foot exam Order - - -  Foot Form Completion - Done -    Refer educator also    Hyperlipidemia Controlled, no change in medication Hyperlipidemia:Low fat diet discussed and encouraged.   Lipid Panel  Lab Results  Component Value Date   CHOL 129 01/18/2016   HDL 41 (L) 01/18/2016   LDLCALC 66 01/18/2016   TRIG 109 01/18/2016   CHOLHDL 3.1 01/18/2016     Updated lab needed at/ before next visit.Needs to commit to regular exercise   Obesity, Class II, BMI 35.0-39.9, with comorbidity (see actual BMI) Unchanged, weight loss due to uncontrolled DM Patient re-educated about  the importance of commitment to a  minimum of 150 minutes of exercise per week.  The importance of healthy food choices with portion control discussed. Encouraged to start a food diary, count calories and to consider  joining a support group. Sample diet sheets offered. Goals set by the patient for the next several months.   Weight /BMI 04/28/2016 01/22/2016 10/27/2015  WEIGHT 208 lb 6.4 oz 212 lb 1.9 oz 209 lb  HEIGHT 5\' 3"  5\' 3"  -  BMI 36.92 kg/m2 37.58 kg/m2 37.02 kg/m2      Personal history of noncompliance with medical treatment, presenting hazards to health Ongoing challenge as far as diabetes is concerned, will attempt to e engage  Insomnia Sleep hygiene reviewed and written information offered also. Prescription sent for  medication needed.

## 2016-04-29 NOTE — Assessment & Plan Note (Signed)
Unchanged, weight loss due to uncontrolled DM Patient re-educated about  the importance of commitment to a  minimum of 150 minutes of exercise per week.  The importance of healthy food choices with portion control discussed. Encouraged to start a food diary, count calories and to consider  joining a support group. Sample diet sheets offered. Goals set by the patient for the next several months.   Weight /BMI 04/28/2016 01/22/2016 10/27/2015  WEIGHT 208 lb 6.4 oz 212 lb 1.9 oz 209 lb  HEIGHT 5\' 3"  5\' 3"  -  BMI 36.92 kg/m2 37.58 kg/m2 37.02 kg/m2

## 2016-04-29 NOTE — Assessment & Plan Note (Signed)
Sleep hygiene reviewed and written information offered also. Prescription sent for  medication needed.  

## 2016-04-29 NOTE — Assessment & Plan Note (Signed)
Controlled, no change in medication DASH diet and commitment to daily physical activity for a minimum of 30 minutes discussed and encouraged, as a part of hypertension management. The importance of attaining a healthy weight is also discussed.  BP/Weight 04/28/2016 01/22/2016 10/27/2015 10/01/2015 08/27/2015 05/26/2015 Q000111Q  Systolic BP XX123456 A999333 0000000 123XX123 Q000111Q [id number] Q000111Q  Diastolic BP 78 80 70 82 94 74 102  Wt. (Lbs) 208.4 212.12 209 211.4 213 208 222  BMI 36.92 37.58 37.02 37.45 37.74 36.85 40.59

## 2016-04-29 NOTE — Assessment & Plan Note (Signed)
Controlled, no change in medication Hyperlipidemia:Low fat diet discussed and encouraged.   Lipid Panel  Lab Results  Component Value Date   CHOL 129 01/18/2016   HDL 41 (L) 01/18/2016   LDLCALC 66 01/18/2016   TRIG 109 01/18/2016   CHOLHDL 3.1 01/18/2016     Updated lab needed at/ before next visit.Needs to commit to regular exercise

## 2016-05-27 ENCOUNTER — Encounter: Payer: Self-pay | Admitting: Family Medicine

## 2016-06-06 ENCOUNTER — Telehealth: Payer: Self-pay | Admitting: Gastroenterology

## 2016-06-06 NOTE — Telephone Encounter (Signed)
Recall for tcs °

## 2016-06-07 NOTE — Telephone Encounter (Signed)
Letter mailed to pt.  

## 2016-06-09 ENCOUNTER — Other Ambulatory Visit: Payer: Self-pay | Admitting: Family Medicine

## 2016-06-10 ENCOUNTER — Other Ambulatory Visit: Payer: Self-pay | Admitting: Family Medicine

## 2016-06-10 DIAGNOSIS — E118 Type 2 diabetes mellitus with unspecified complications: Principal | ICD-10-CM

## 2016-06-10 DIAGNOSIS — E1165 Type 2 diabetes mellitus with hyperglycemia: Secondary | ICD-10-CM

## 2016-06-27 ENCOUNTER — Other Ambulatory Visit: Payer: Self-pay

## 2016-06-27 DIAGNOSIS — M5010 Cervical disc disorder with radiculopathy, unspecified cervical region: Secondary | ICD-10-CM

## 2016-06-27 MED ORDER — GABAPENTIN 300 MG PO CAPS: 300.0000 mg | ORAL_CAPSULE | Freq: Every day | ORAL | 1 refills | Status: DC

## 2016-06-27 MED ORDER — ZOLPIDEM TARTRATE 10 MG PO TABS: 10.0000 mg | ORAL_TABLET | Freq: Every evening | ORAL | 0 refills | Status: DC | PRN

## 2016-07-01 ENCOUNTER — Other Ambulatory Visit: Payer: Self-pay

## 2016-07-01 DIAGNOSIS — M5010 Cervical disc disorder with radiculopathy, unspecified cervical region: Secondary | ICD-10-CM

## 2016-07-22 DIAGNOSIS — E1169 Type 2 diabetes mellitus with other specified complication: Secondary | ICD-10-CM | POA: Diagnosis not present

## 2016-07-22 DIAGNOSIS — E785 Hyperlipidemia, unspecified: Secondary | ICD-10-CM | POA: Diagnosis not present

## 2016-07-22 DIAGNOSIS — E669 Obesity, unspecified: Secondary | ICD-10-CM | POA: Diagnosis not present

## 2016-07-22 LAB — COMPLETE METABOLIC PANEL WITH GFR
ALK PHOS: 81 U/L (ref 33–130)
ALT: 14 U/L (ref 6–29)
AST: 18 U/L (ref 10–35)
Albumin: 3.9 g/dL (ref 3.6–5.1)
BUN: 9 mg/dL (ref 7–25)
CO2: 24 mmol/L (ref 20–31)
CREATININE: 0.75 mg/dL (ref 0.60–0.93)
Calcium: 9.3 mg/dL (ref 8.6–10.4)
Chloride: 103 mmol/L (ref 98–110)
GFR, Est Non African American: 81 mL/min (ref >=60)
Glucose, Bld: 201 mg/dL — ABNORMAL HIGH (ref 65–99)
Potassium: 4.3 mmol/L (ref 3.5–5.3)
SODIUM: 139 mmol/L (ref 135–146)
Total Bilirubin: 0.4 mg/dL (ref 0.2–1.2)
Total Protein: 6.7 g/dL (ref 6.1–8.1)

## 2016-07-22 LAB — LIPID PANEL
Cholesterol: 140 mg/dL (ref <200)
HDL: 36 mg/dL — ABNORMAL LOW (ref >50)
LDL CALC: 84 mg/dL (ref <100)
Total CHOL/HDL Ratio: 3.9 Ratio (ref <5.0)
Triglycerides: 99 mg/dL (ref <150)
VLDL: 20 mg/dL (ref <30)

## 2016-07-23 LAB — HEMOGLOBIN A1C
Hgb A1c MFr Bld: 10.5 % — ABNORMAL HIGH (ref <5.7)
MEAN PLASMA GLUCOSE: 255 mg/dL

## 2016-07-26 ENCOUNTER — Other Ambulatory Visit: Payer: Self-pay | Admitting: Family Medicine

## 2016-07-26 DIAGNOSIS — I10 Essential (primary) hypertension: Secondary | ICD-10-CM

## 2016-07-26 DIAGNOSIS — J45991 Cough variant asthma: Secondary | ICD-10-CM

## 2016-08-03 ENCOUNTER — Ambulatory Visit (INDEPENDENT_AMBULATORY_CARE_PROVIDER_SITE_OTHER): Payer: Medicare HMO | Admitting: Family Medicine

## 2016-08-03 ENCOUNTER — Encounter: Payer: Self-pay | Admitting: Family Medicine

## 2016-08-03 VITALS — BP 128/78 | HR 88 | Temp 96.1°F | Resp 16 | Ht 63.0 in | Wt 206.0 lb

## 2016-08-03 DIAGNOSIS — Z1231 Encounter for screening mammogram for malignant neoplasm of breast: Secondary | ICD-10-CM

## 2016-08-03 DIAGNOSIS — I10 Essential (primary) hypertension: Secondary | ICD-10-CM

## 2016-08-03 DIAGNOSIS — F5101 Primary insomnia: Secondary | ICD-10-CM | POA: Diagnosis not present

## 2016-08-03 DIAGNOSIS — Z1239 Encounter for other screening for malignant neoplasm of breast: Secondary | ICD-10-CM

## 2016-08-03 DIAGNOSIS — E1165 Type 2 diabetes mellitus with hyperglycemia: Secondary | ICD-10-CM | POA: Diagnosis not present

## 2016-08-03 DIAGNOSIS — E669 Obesity, unspecified: Secondary | ICD-10-CM

## 2016-08-03 DIAGNOSIS — E7849 Other hyperlipidemia: Secondary | ICD-10-CM

## 2016-08-03 DIAGNOSIS — IMO0002 Reserved for concepts with insufficient information to code with codable children: Secondary | ICD-10-CM

## 2016-08-03 DIAGNOSIS — IMO0001 Reserved for inherently not codable concepts without codable children: Secondary | ICD-10-CM

## 2016-08-03 DIAGNOSIS — J45991 Cough variant asthma: Secondary | ICD-10-CM | POA: Diagnosis not present

## 2016-08-03 DIAGNOSIS — Z1211 Encounter for screening for malignant neoplasm of colon: Secondary | ICD-10-CM

## 2016-08-03 DIAGNOSIS — E118 Type 2 diabetes mellitus with unspecified complications: Secondary | ICD-10-CM

## 2016-08-03 DIAGNOSIS — E784 Other hyperlipidemia: Secondary | ICD-10-CM

## 2016-08-03 MED ORDER — SITAGLIPTIN PHOSPHATE 100 MG PO TABS: 100.0000 mg | ORAL_TABLET | Freq: Every day | ORAL | 3 refills | Status: DC

## 2016-08-03 NOTE — Patient Instructions (Addendum)
Wellness in June  F/u with end August  Excellent blood pressure, kidney function, liver and cholesterol  pls schedule mamogram past due  Dr Nona Dell office will contact you for colonscopy  Non fast hBA1C, chem 7 and eGFR 1 week before  New is [place] 1 daily, call and let nurse know by  Next week Monday if you will take [place] or if you will start th insulin I have recommended in the past from [facility]    Please work on good  health habits so that your health will improve. 1. Commitment to daily physical activity for 30 to 60  minutes, if you are able to do this.  2. Commitment to wise food choices. Aim for half of your  food intake to be vegetable and fruit, one quarter starchy foods, and one quarter protein. Try to eat on a regular schedule  3 meals per day, snacking between meals should be limited to vegetables or fruits or small portions of nuts. 64 ounces of water per day is generally recommended, unless you have specific health conditions, like heart failure or kidney failure where you will need to limit fluid intake.  3. Commitment to sufficient and a  good quality of physical and mental rest daily, generally between 6 to 8 hours per day.  WITH PERSISTANCE AND PERSEVERANCE, THE IMPOSSIBLE , BECOMES THE NORM!   Thanks for choosing [facility], we consider it a privelige to serve you.

## 2016-08-04 ENCOUNTER — Other Ambulatory Visit: Payer: Self-pay | Admitting: Family Medicine

## 2016-08-04 ENCOUNTER — Telehealth: Payer: Self-pay | Admitting: Family Medicine

## 2016-08-04 NOTE — Progress Notes (Signed)
novolin

## 2016-08-04 NOTE — Telephone Encounter (Signed)
Patient wanted Dr.Simpson to know she can not afford the medication that she and Dr.Simpson spoke about for her blood sugar.   Cb#: [phone]

## 2016-08-04 NOTE — Telephone Encounter (Signed)
Please Let her know to buy the novolin N insulin OTC at [facility] and start with 7 units twice daily, may increase to 10 uniits twice daily, if needed,continue her diabetes tablets she is currently taking

## 2016-08-05 MED ORDER — INSULIN NPH (HUMAN) (ISOPHANE) 100 UNIT/ML ~~LOC~~ SUSP: 7.0000 [IU] | Freq: Two times a day (BID) | SUBCUTANEOUS | 11 refills | Status: DC

## 2016-08-05 NOTE — Telephone Encounter (Signed)
Called and left message.

## 2016-08-07 ENCOUNTER — Encounter: Payer: Self-pay | Admitting: Family Medicine

## 2016-08-07 NOTE — Assessment & Plan Note (Signed)
Sleep hygiene reviewed and written information offered also. Prescription sent for  medication needed.  

## 2016-08-07 NOTE — Assessment & Plan Note (Signed)
Improved but not at goal, will need to use insulin due to cost  Ms. Tiffany Jacobs is reminded of the importance of commitment to daily physical activity for 30 minutes or more, as able and the need to limit carbohydrate intake to 30 to 60 grams per meal to help with blood sugar control.   The need to take medication as prescribed, test blood sugar as directed, and to call between visits if there is a concern that blood sugar is uncontrolled is also discussed.   Ms. Tiffany Jacobs is reminded of the importance of daily foot exam, annual eye examination, and good blood sugar, blood pressure and cholesterol control.  Diabetic Labs Latest Ref Rng & Units 07/22/2016 04/18/2016 01/18/2016 10/01/2015 08/28/2015  HbA1c <5.7 % 10.5(H) 12.0(H) 11.1(H) 11.7(H) -  Microalbumin Not Estab. ug/mL - - - - 39.6(H)  Micro/Creat Ratio 0.0 - 30.0 mg/g creat - - - - 29.2  Chol <200 mg/dL 140 - 129 - -  HDL >50 mg/dL 36(L) - 41(L) - -  Calc LDL <100 mg/dL 84 - 66 - -  Triglycerides <150 mg/dL 99 - 109 - -  Creatinine 0.60 - 0.93 mg/dL 0.75 0.87 0.82 0.88 -   BP/Weight 08/03/2016 04/28/2016 01/22/2016 10/27/2015 10/01/2015 08/27/2015 11/08/8331  Systolic BP 832 919 166 060 045 997 741  Diastolic BP 78 78 80 70 82 94 74  Wt. (Lbs) 206.04 208.4 212.12 209 211.4 213 208  BMI 36.5 36.92 37.58 37.02 37.45 37.74 36.85   Foot/eye exam completion dates Latest Ref Rng & Units 08/27/2015 06/19/2014  Eye Exam No Retinopathy - No Retinopathy  Foot exam Order - - -  Foot Form Completion - Done -

## 2016-08-07 NOTE — Assessment & Plan Note (Signed)
Controlled, no change in medication  

## 2016-08-07 NOTE — Assessment & Plan Note (Signed)
Controlled, no change in medication DASH diet and commitment to daily physical activity for a minimum of 30 minutes discussed and encouraged, as a part of hypertension management. The importance of attaining a healthy weight is also discussed.  BP/Weight 08/03/2016 04/28/2016 01/22/2016 10/27/2015 10/01/2015 08/27/2015 08/08/7996  Systolic BP 721 587 276 184 859 276 394  Diastolic BP 78 78 80 70 82 94 74  Wt. (Lbs) 206.04 208.4 212.12 209 211.4 213 208  BMI 36.5 36.92 37.58 37.02 37.45 37.74 36.85

## 2016-08-07 NOTE — Assessment & Plan Note (Signed)
Hyperlipidemia:Low fat diet discussed and encouraged.   Lipid Panel  Lab Results  Component Value Date   CHOL 140 07/22/2016   HDL 36 (L) 07/22/2016   LDLCALC 84 07/22/2016   TRIG 99 07/22/2016   CHOLHDL 3.9 07/22/2016   Pt encouraged to increase exercise commitment as able

## 2016-08-07 NOTE — Assessment & Plan Note (Signed)
Deteriorated. Patient re-educated about  the importance of commitment to a  minimum of 150 minutes of exercise per week.  The importance of healthy food choices with portion control discussed. Encouraged to start a food diary, count calories and to consider  joining a support group. Sample diet sheets offered. Goals set by the patient for the next several months.   Weight /BMI 08/03/2016 04/28/2016 01/22/2016  WEIGHT 206 lb 0.6 oz 208 lb 6.4 oz 212 lb 1.9 oz  HEIGHT 5\' 3"  5\' 3"  5\' 3"   BMI 36.5 kg/m2 36.92 kg/m2 37.58 kg/m2

## 2016-08-07 NOTE — Progress Notes (Signed)
Tiffany Jacobs     MRN: [id number]      DOB: 16-Dec-1945   HPI Tiffany Jacobs is here for follow up and re-evaluation of chronic medical conditions, medication management and review of any available recent lab and radiology data.  Preventive health is updated, specifically  Cancer screening and Immunization.   Questions or concerns regarding consultations or procedures which the PT has had in the interim are  addressed. The PT denies any adverse reactions to current medications since the last visit.  States there is a lot of stress at home that her children and their families bring in and she finds it difficult to ask them to leave , trying  With her blood sugar but FBG still around 200   ROS Denies recent fever or chills. Denies sinus pressure, nasal congestion, ear pain or sore throat. Denies chest congestion, productive cough or wheezing. Denies chest pains, palpitations and leg swelling Denies abdominal pain, nausea, vomiting,diarrhea or constipation.   Denies dysuria, frequency, hesitancy or incontinence. Denies joint pain, swelling and limitation in mobility. Denies headaches, seizures, numbness, or tingling. Denies  uncontrolled depression, anxiety or insomnia. Denies skin break down or rash.   PE  BP 128/78 (BP Location: Left Arm, Patient Position: Sitting, Cuff Size: Normal)   Pulse 88   Temp (!) 96.1 F (35.6 C) (Temporal)   Resp 16   Ht 5\' 3"  (1.6 m)   Wt 206 lb 0.6 oz (93.5 kg)   SpO2 94%   BMI 36.50 kg/m   Patient alert and oriented and in no cardiopulmonary distress.  HEENT: No facial asymmetry, EOMI,   oropharynx pink and moist.  Neck supple no JVD, no mass.  Chest: Clear to auscultation bilaterally.  CVS: S1, S2 no murmurs, no S3.Regular rate.  ABD: Soft non tender.   Ext: No edema  MS: Adequate ROM spine, shoulders, hips and knees.  Skin: Intact, no ulcerations or rash noted.  Psych: Good eye contact, normal affect. Memory intact not anxious or  depressed appearing.  CNS: CN 2-12 intact, power,  normal throughout.no focal deficits noted.   Assessment & Plan  Uncontrolled diabetes mellitus ([place]) Improved but not at goal, will need to use insulin due to cost  Tiffany Jacobs is reminded of the importance of commitment to daily physical activity for 30 minutes or more, as able and the need to limit carbohydrate intake to 30 to 60 grams per meal to help with blood sugar control.   The need to take medication as prescribed, test blood sugar as directed, and to call between visits if there is a concern that blood sugar is uncontrolled is also discussed.   Tiffany Jacobs is reminded of the importance of daily foot exam, annual eye examination, and good blood sugar, blood pressure and cholesterol control.  Diabetic Labs Latest Ref Rng & Units 07/22/2016 04/18/2016 01/18/2016 10/01/2015 08/28/2015  HbA1c <5.7 % 10.5(H) 12.0(H) 11.1(H) 11.7(H) -  Microalbumin Not Estab. ug/mL - - - - 39.6(H)  Micro/Creat Ratio 0.0 - 30.0 mg/g creat - - - - 29.2  Chol <200 mg/dL 140 - 129 - -  HDL >50 mg/dL 36(L) - 41(L) - -  Calc LDL <100 mg/dL 84 - 66 - -  Triglycerides <150 mg/dL 99 - 109 - -  Creatinine 0.60 - 0.93 mg/dL 0.75 0.87 0.82 0.88 -   BP/Weight 08/03/2016 04/28/2016 01/22/2016 10/27/2015 10/01/2015 08/27/2015 5/32/9924  Systolic BP 268 341 962 229 798 921 194  Diastolic BP 78 78 80  70 82 94 74  Wt. (Lbs) 206.04 208.4 212.12 209 211.4 213 208  BMI 36.5 36.92 37.58 37.02 37.45 37.74 36.85   Foot/eye exam completion dates Latest Ref Rng & Units 08/27/2015 06/19/2014  Eye Exam No Retinopathy - No Retinopathy  Foot exam Order - - -  Foot Form Completion - Done -        Essential hypertension Controlled, no change in medication DASH diet and commitment to daily physical activity for a minimum of 30 minutes discussed and encouraged, as a part of hypertension management. The importance of attaining a healthy weight is also discussed.  BP/Weight  08/03/2016 04/28/2016 01/22/2016 10/27/2015 10/01/2015 08/27/2015 06/06/270  Systolic BP 536 644 034 742 595 638 756  Diastolic BP 78 78 80 70 82 94 74  Wt. (Lbs) 206.04 208.4 212.12 209 211.4 213 208  BMI 36.5 36.92 37.58 37.02 37.45 37.74 36.85       COUGH VARIANT ASTHMA Controlled, no change in medication   Insomnia Sleep hygiene reviewed and written information offered also. Prescription sent for  medication needed.   Obesity, Class II, BMI 35.0-39.9, with comorbidity (see actual BMI) Deteriorated. Patient re-educated about  the importance of commitment to a  minimum of 150 minutes of exercise per week.  The importance of healthy food choices with portion control discussed. Encouraged to start a food diary, count calories and to consider  joining a support group. Sample diet sheets offered. Goals set by the patient for the next several months.   Weight /BMI 08/03/2016 04/28/2016 01/22/2016  WEIGHT 206 lb 0.6 oz 208 lb 6.4 oz 212 lb 1.9 oz  HEIGHT 5\' 3"  5\' 3"  5\' 3"   BMI 36.5 kg/m2 36.92 kg/m2 37.58 kg/m2      Hyperlipidemia Hyperlipidemia:Low fat diet discussed and encouraged.   Lipid Panel  Lab Results  Component Value Date   CHOL 140 07/22/2016   HDL 36 (L) 07/22/2016   LDLCALC 84 07/22/2016   TRIG 99 07/22/2016   CHOLHDL 3.9 07/22/2016   Pt encouraged to increase exercise commitment as able

## 2016-08-10 ENCOUNTER — Telehealth: Payer: Self-pay

## 2016-08-10 NOTE — Telephone Encounter (Signed)
[phone] patient received letter to schedule tcs

## 2016-08-17 NOTE — Telephone Encounter (Signed)
LM for a return call.  

## 2016-08-18 NOTE — Telephone Encounter (Signed)
LMOM to call back

## 2016-08-19 ENCOUNTER — Telehealth: Payer: Self-pay

## 2016-08-19 NOTE — Telephone Encounter (Signed)
Left VM to reschedule  AWV appt only due to provider schedule -nr

## 2016-08-23 ENCOUNTER — Telehealth: Payer: Self-pay

## 2016-08-23 NOTE — Telephone Encounter (Signed)
See separate triage.  

## 2016-08-25 NOTE — Telephone Encounter (Signed)
Mailed letter. Insulin has already been sent in. The same one as before when patient didn't collect. Encouraged to collect this time and call with concerns

## 2016-08-29 ENCOUNTER — Ambulatory Visit: Payer: Medicare HMO

## 2016-08-29 NOTE — Telephone Encounter (Signed)
Gastroenterology Pre-Procedure Review  Request Date: 08/23/2016 Requesting Physician: Dr. Moshe Cipro  PATIENT REVIEW QUESTIONS: The patient responded to the following health history questions as indicated:    1. Diabetes Melitis: YES 2. Joint replacements in the past 12 months: no 3. Major health problems in the past 3 months: no 4. Has an artificial valve or MVP: no 5. Has a defibrillator: no 6. Has been advised in past to take antibiotics in advance of a procedure like teeth cleaning: no 7. Family history of colon cancer: no  8. Alcohol Use: no 9. History of sleep apnea: no  10. History of coronary artery or other vascular stents placed within the last 12 months: no    MEDICATIONS & ALLERGIES:    Patient reports the following regarding taking any blood thinners:   Plavix? no Aspirin? YES Coumadin? no Brilinta? no Xarelto? no Eliquis? no Pradaxa? no Savaysa? no Effient? no  Patient confirms/reports the following medications:  Current Outpatient Prescriptions  Medication Sig Dispense Refill  . albuterol (PROVENTIL HFA;VENTOLIN HFA) 108 (90 BASE) MCG/ACT inhaler Inhale 2 puffs into the lungs every 6 (six) hours as needed for wheezing or shortness of breath. 18 g 3  . albuterol (PROVENTIL) (2.5 MG/3ML) 0.083% nebulizer solution Take 2.5 mg by nebulization every 6 (six) hours as needed for wheezing or shortness of breath.    Marland Kitchen amLODipine (NORVASC) 10 MG tablet Take 1 tablet (10 mg total) by mouth daily. 90 tablet 1  . aspirin (ASPIRIN LOW DOSE) 81 MG EC tablet Take 81 mg by mouth every morning.     . fluticasone (FLONASE) 50 MCG/ACT nasal spray USE 2 SPRAYS IN EACH NOSTRIL EVERY DAY 48 g 1  . gabapentin (NEURONTIN) 300 MG capsule Take 1 capsule (300 mg total) by mouth at bedtime. 90 capsule 1  . glipiZIDE (GLUCOTROL XL) 10 MG 24 hr tablet TAKE 2 TABLETS EVERY DAY WITH BREAKFAST 180 tablet 1  . hydrALAZINE (APRESOLINE) 25 MG tablet TAKE 1 TABLET THREE TIMES DAILY 270 tablet 1  .  metFORMIN (GLUCOPHAGE) 1000 MG tablet Take 1 tablet (1,000 mg total) by mouth 2 (two) times daily with a meal. 180 tablet 1  . montelukast (SINGULAIR) 10 MG tablet TAKE 1 TABLET AT BEDTIME 90 tablet 1  . pravastatin (PRAVACHOL) 20 MG tablet TAKE 1 TABLET EVERY DAY 90 tablet 1  . spironolactone (ALDACTONE) 100 MG tablet Take 1 tablet (100 mg total) by mouth daily. 90 tablet 1  . zolpidem (AMBIEN) 10 MG tablet Take 1 tablet (10 mg total) by mouth at bedtime as needed. 90 tablet 0  . acetaminophen (TYLENOL) 650 MG CR tablet Take 1 tablet (650 mg total) by mouth every 8 (eight) hours as needed for pain. Reported on 08/27/2015 (Patient not taking: Reported on 08/23/2016) 270 tablet 1  . glucose blood (TRUE METRIX BLOOD GLUCOSE TEST) test strip Use as instructed three times daily dx e11.65 300 each 1  . glucose blood test strip Use as instructed once daily dx E11.9 50 each 5  . insulin NPH Human (NOVOLIN N RELION) 100 UNIT/ML injection Inject 0.07 mLs (7 Units total) into the skin 2 (two) times daily before a meal. Take 7 units twice daily. (Patient not taking: Reported on 08/23/2016) 10 mL 11  . ONETOUCH DELICA LANCETS 40J MISC Three times daily testing dx E11.65 300 each 1   No current facility-administered medications for this visit.     Patient confirms/reports the following allergies:  Allergies  Allergen Reactions  . Ace Inhibitors  Cough  . Latex Itching and Rash    No orders of the defined types were placed in this encounter.   AUTHORIZATION INFORMATION Primary Insurance:   ID #:  Group #:  Pre-Cert / Auth required:  Pre-Cert / Auth #:   Secondary Insurance:  ID #:   Group #:  Pre-Cert / Auth required: Pre-Cert / Auth #:   SCHEDULE INFORMATION: Procedure has been scheduled as follows:  Date:  Time:   Location:   This Gastroenterology Pre-Precedure Review Form is being routed to the following provider(s): Barney Drain, MD

## 2016-08-30 ENCOUNTER — Other Ambulatory Visit: Payer: Self-pay | Admitting: Family Medicine

## 2016-08-30 DIAGNOSIS — IMO0002 Reserved for concepts with insufficient information to code with codable children: Secondary | ICD-10-CM

## 2016-08-30 DIAGNOSIS — E118 Type 2 diabetes mellitus with unspecified complications: Principal | ICD-10-CM

## 2016-08-30 DIAGNOSIS — E1165 Type 2 diabetes mellitus with hyperglycemia: Secondary | ICD-10-CM

## 2016-09-01 ENCOUNTER — Ambulatory Visit: Payer: Medicare HMO

## 2016-09-06 NOTE — Telephone Encounter (Signed)
MOVI PREP SPLIT DOSING, CLEAR LIQUIDS WITH BREAKFAST.  HOLD GLIPIZIDE ON AM OF TCS. CONTINUE GLUCOPHAGE.

## 2016-09-12 ENCOUNTER — Telehealth: Payer: Self-pay | Admitting: Gastroenterology

## 2016-09-12 NOTE — Telephone Encounter (Signed)
[phone]  PATIENT RECEIVED LETTER TO SCHEDULE TCS

## 2016-09-13 NOTE — Telephone Encounter (Signed)
Tried to call and could not leave a message. Mailing a letter to call for appt for the colonoscopy.

## 2016-09-13 NOTE — Telephone Encounter (Signed)
See separate note.

## 2016-09-14 ENCOUNTER — Other Ambulatory Visit: Payer: Self-pay

## 2016-09-14 DIAGNOSIS — Z1211 Encounter for screening for malignant neoplasm of colon: Secondary | ICD-10-CM

## 2016-09-14 MED ORDER — PEG 3350-KCL-NA BICARB-NACL 420 G PO SOLR: 4000.0000 mL | ORAL | 0 refills | Status: DC

## 2016-09-14 NOTE — Addendum Note (Signed)
Addended by: Everardo All on: 09/14/2016 12:00 PM   Modules accepted: Orders

## 2016-09-14 NOTE — Telephone Encounter (Signed)
Pt called and is scheduled for 10/10/2016 at at 8:30 with Dr. Oneida Alar for the colonoscopy.  Rx sent to the pharmacy and instructions mailed to pt.

## 2016-09-15 NOTE — Telephone Encounter (Signed)
Pt had to be rescheduled to 10:30 on 10/10/2016.  I called and left VM of the change and I am mailing instructions with a note about the change.

## 2016-09-19 ENCOUNTER — Ambulatory Visit: Payer: Medicare HMO

## 2016-09-19 DIAGNOSIS — I10 Essential (primary) hypertension: Secondary | ICD-10-CM | POA: Diagnosis not present

## 2016-09-19 DIAGNOSIS — E109 Type 1 diabetes mellitus without complications: Secondary | ICD-10-CM | POA: Diagnosis not present

## 2016-09-19 DIAGNOSIS — H25813 Combined forms of age-related cataract, bilateral: Secondary | ICD-10-CM | POA: Diagnosis not present

## 2016-09-19 DIAGNOSIS — Z0100 Encounter for examination of eyes and vision without abnormal findings: Secondary | ICD-10-CM | POA: Diagnosis not present

## 2016-09-26 NOTE — Telephone Encounter (Signed)
NO PA is needed for TSC

## 2016-09-29 ENCOUNTER — Ambulatory Visit (HOSPITAL_COMMUNITY)
Admission: RE | Admit: 2016-09-29 | Discharge: 2016-09-29 | Disposition: A | Discharge status: Home / Self Care | Payer: Medicare HMO | Source: Ambulatory Visit | Attending: Family Medicine | Admitting: Family Medicine

## 2016-09-29 ENCOUNTER — Ambulatory Visit (HOSPITAL_COMMUNITY): Payer: Medicare HMO

## 2016-09-29 ENCOUNTER — Other Ambulatory Visit: Payer: Self-pay | Admitting: Family Medicine

## 2016-09-29 DIAGNOSIS — Z1231 Encounter for screening mammogram for malignant neoplasm of breast: Secondary | ICD-10-CM | POA: Insufficient documentation

## 2016-10-10 ENCOUNTER — Encounter (HOSPITAL_COMMUNITY): Payer: Self-pay | Admitting: *Deleted

## 2016-10-10 ENCOUNTER — Encounter (HOSPITAL_COMMUNITY)
Admission: RE | Disposition: A | Discharge status: Home / Self Care | Payer: Self-pay | Source: Ambulatory Visit | Attending: Gastroenterology

## 2016-10-10 ENCOUNTER — Ambulatory Visit (HOSPITAL_COMMUNITY)
Admission: RE | Admit: 2016-10-10 | Discharge: 2016-10-10 | Disposition: A | Discharge status: Home / Self Care | Payer: Medicare HMO | Source: Ambulatory Visit | Attending: Gastroenterology | Admitting: Gastroenterology

## 2016-10-10 DIAGNOSIS — Z7951 Long term (current) use of inhaled steroids: Secondary | ICD-10-CM | POA: Diagnosis not present

## 2016-10-10 DIAGNOSIS — K648 Other hemorrhoids: Secondary | ICD-10-CM | POA: Diagnosis not present

## 2016-10-10 DIAGNOSIS — Z91040 Latex allergy status: Secondary | ICD-10-CM | POA: Diagnosis not present

## 2016-10-10 DIAGNOSIS — I10 Essential (primary) hypertension: Secondary | ICD-10-CM | POA: Insufficient documentation

## 2016-10-10 DIAGNOSIS — D123 Benign neoplasm of transverse colon: Secondary | ICD-10-CM | POA: Insufficient documentation

## 2016-10-10 DIAGNOSIS — K644 Residual hemorrhoidal skin tags: Secondary | ICD-10-CM | POA: Diagnosis not present

## 2016-10-10 DIAGNOSIS — K635 Polyp of colon: Secondary | ICD-10-CM

## 2016-10-10 DIAGNOSIS — Z1212 Encounter for screening for malignant neoplasm of rectum: Secondary | ICD-10-CM | POA: Diagnosis not present

## 2016-10-10 DIAGNOSIS — K219 Gastro-esophageal reflux disease without esophagitis: Secondary | ICD-10-CM | POA: Diagnosis not present

## 2016-10-10 DIAGNOSIS — K621 Rectal polyp: Secondary | ICD-10-CM | POA: Diagnosis not present

## 2016-10-10 DIAGNOSIS — D124 Benign neoplasm of descending colon: Secondary | ICD-10-CM | POA: Diagnosis not present

## 2016-10-10 DIAGNOSIS — K5790 Diverticulosis of intestine, part unspecified, without perforation or abscess without bleeding: Secondary | ICD-10-CM | POA: Diagnosis not present

## 2016-10-10 DIAGNOSIS — Q438 Other specified congenital malformations of intestine: Secondary | ICD-10-CM | POA: Insufficient documentation

## 2016-10-10 DIAGNOSIS — Z96652 Presence of left artificial knee joint: Secondary | ICD-10-CM | POA: Diagnosis not present

## 2016-10-10 DIAGNOSIS — Z87891 Personal history of nicotine dependence: Secondary | ICD-10-CM | POA: Insufficient documentation

## 2016-10-10 DIAGNOSIS — E669 Obesity, unspecified: Secondary | ICD-10-CM | POA: Insufficient documentation

## 2016-10-10 DIAGNOSIS — Z79899 Other long term (current) drug therapy: Secondary | ICD-10-CM | POA: Insufficient documentation

## 2016-10-10 DIAGNOSIS — Z823 Family history of stroke: Secondary | ICD-10-CM | POA: Insufficient documentation

## 2016-10-10 DIAGNOSIS — Z1211 Encounter for screening for malignant neoplasm of colon: Secondary | ICD-10-CM | POA: Diagnosis not present

## 2016-10-10 DIAGNOSIS — Z803 Family history of malignant neoplasm of breast: Secondary | ICD-10-CM | POA: Insufficient documentation

## 2016-10-10 DIAGNOSIS — E119 Type 2 diabetes mellitus without complications: Secondary | ICD-10-CM | POA: Diagnosis not present

## 2016-10-10 DIAGNOSIS — Z6837 Body mass index (BMI) 37.0-37.9, adult: Secondary | ICD-10-CM | POA: Diagnosis not present

## 2016-10-10 DIAGNOSIS — Z8249 Family history of ischemic heart disease and other diseases of the circulatory system: Secondary | ICD-10-CM | POA: Insufficient documentation

## 2016-10-10 DIAGNOSIS — Z888 Allergy status to other drugs, medicaments and biological substances status: Secondary | ICD-10-CM | POA: Insufficient documentation

## 2016-10-10 DIAGNOSIS — J45909 Unspecified asthma, uncomplicated: Secondary | ICD-10-CM | POA: Diagnosis not present

## 2016-10-10 DIAGNOSIS — Z794 Long term (current) use of insulin: Secondary | ICD-10-CM | POA: Diagnosis not present

## 2016-10-10 DIAGNOSIS — Z833 Family history of diabetes mellitus: Secondary | ICD-10-CM | POA: Diagnosis not present

## 2016-10-10 DIAGNOSIS — Z809 Family history of malignant neoplasm, unspecified: Secondary | ICD-10-CM | POA: Insufficient documentation

## 2016-10-10 HISTORY — PX: COLONOSCOPY: SHX5424

## 2016-10-10 LAB — GLUCOSE, CAPILLARY: GLUCOSE-CAPILLARY: 290 mg/dL — AB (ref 65–99)

## 2016-10-10 SURGERY — COLONOSCOPY
Anesthesia: Moderate Sedation

## 2016-10-10 MED ORDER — MIDAZOLAM HCL 5 MG/5ML IJ SOLN
INTRAMUSCULAR | Status: AC
  Filled 2016-10-10: qty 10

## 2016-10-10 MED ORDER — MEPERIDINE HCL 100 MG/ML IJ SOLN
INTRAMUSCULAR | Status: AC
  Filled 2016-10-10: qty 2

## 2016-10-10 MED ORDER — MIDAZOLAM HCL 5 MG/5ML IJ SOLN
INTRAMUSCULAR | Status: DC | PRN
  Administered 2016-10-10: 2 mg via INTRAVENOUS
  Administered 2016-10-10: 1 mg via INTRAVENOUS
  Administered 2016-10-10: 2 mg via INTRAVENOUS

## 2016-10-10 MED ORDER — STERILE WATER FOR IRRIGATION IR SOLN
Status: DC | PRN
  Administered 2016-10-10: 100 mL

## 2016-10-10 MED ORDER — MEPERIDINE HCL 100 MG/ML IJ SOLN
INTRAMUSCULAR | Status: DC | PRN
  Administered 2016-10-10 (×2): 25 mg via INTRAVENOUS

## 2016-10-10 MED ORDER — SODIUM CHLORIDE 0.9 % IV SOLN
INTRAVENOUS | Status: DC
Start: 2016-10-10 — End: 2016-10-10
  Administered 2016-10-10: 10:00:00 via INTRAVENOUS

## 2016-10-10 NOTE — Discharge Instructions (Signed)
You have diverticulosis IN YOUR LEFT AND RIGHT COLON. YOU HAD SEVEN POLYPS REMOVED.    DRINK WATER TO KEEP YOUR URINE LIGHT YELLOW.  FOLLOW A HIGH FIBER DIET. AVOID ITEMS THAT CAUSE BLOATING. See info below.  CONTINUE YOUR WEIGHT LOSS EFFORTS.  WHILE I DO NOT WANT TO ALARM YOU, YOUR BODY MASS INDEX (BMI) IS OVER 30 WHICH MEANS YOU ARE OBESE. OBESITY ACTIVATES CANCER GENES AND INCREASE YOUR RISK FOR POLYPS. OBESITY IS ASSOCIATED WITH AN INCREASE RISK FOR ALL CANCERS, INCLUDING ESOPHAGEAL AND COLON CANCER. A WEIGHT OF 165 LBS  WILL GET YOUR BMI UNDER 30.  YOUR BIOPSY RESULTS WILL BE AVAILABLE IN MY CHART AFTER AUG 9 AND MY OFFICE WILL CONTACT YOU IN 10-14 DAYS WITH YOUR RESULTS.   Next colonoscopy in 3 years.  Colonoscopy Care After Read the instructions outlined below and refer to this sheet in the next week. These discharge instructions provide you with general information on caring for yourself after you leave the hospital. While your treatment has been planned according to the most current medical practices available, unavoidable complications occasionally occur. If you have any problems or questions after discharge, call DR. Emya Picado, [phone].  ACTIVITY  You may resume your regular activity, but move at a slower pace for the next 24 hours.   Take frequent rest periods for the next 24 hours.   Walking will help get rid of the air and reduce the bloated feeling in your belly (abdomen).   No driving for 24 hours (because of the medicine (anesthesia) used during the test).   You may shower.   Do not sign any important legal documents or operate any machinery for 24 hours (because of the anesthesia used during the test).    NUTRITION  Drink plenty of fluids.   You may resume your normal diet as instructed by your doctor.   Begin with a light meal and progress to your normal diet. Heavy or fried foods are harder to digest and may make you feel sick to your stomach (nauseated).     Avoid alcoholic beverages for 24 hours or as instructed.    MEDICATIONS  You may resume your normal medications.   WHAT YOU CAN EXPECT TODAY  Some feelings of bloating in the abdomen.   Passage of more gas than usual.   Spotting of blood in your stool or on the toilet paper  .  IF YOU HAD POLYPS REMOVED DURING THE COLONOSCOPY:  Eat a soft diet IF YOU HAVE NAUSEA, BLOATING, ABDOMINAL PAIN, OR VOMITING.    FINDING OUT THE RESULTS OF YOUR TEST Not all test results are available during your visit. DR. Oneida Alar WILL CALL YOU WITHIN 14 DAYS OF YOUR PROCEDUE WITH YOUR RESULTS. Do not assume everything is normal if you have not heard from DR. Kagan Hietpas, CALL HER OFFICE AT [phone].  SEEK IMMEDIATE MEDICAL ATTENTION AND CALL THE OFFICE: [phone] IF:  You have more than a spotting of blood in your stool.   Your belly is swollen (abdominal distention).   You are nauseated or vomiting.   You have a temperature over 101F.   You have abdominal pain or discomfort that is severe or gets worse throughout the day.  High-Fiber Diet A high-fiber diet changes your normal diet to include more whole grains, legumes, fruits, and vegetables. Changes in the diet involve replacing refined carbohydrates with unrefined foods. The calorie level of the diet is essentially unchanged. The Dietary Reference Intake (recommended amount) for adult males is 52  grams per day. For adult females, it is 25 grams per day. Pregnant and lactating women should consume 28 grams of fiber per day. Fiber is the intact part of a plant that is not broken down during digestion. Functional fiber is fiber that has been isolated from the plant to provide a beneficial effect in the body. PURPOSE  Increase stool bulk.   Ease and regulate bowel movements.   Lower cholesterol.   REDUCE RISK OF COLON CANCER  INDICATIONS THAT YOU NEED MORE FIBER  Constipation and hemorrhoids.   Uncomplicated diverticulosis (intestine  condition) and irritable bowel syndrome.   Weight management.   As a protective measure against hardening of the arteries (atherosclerosis), diabetes, and cancer.   GUIDELINES FOR INCREASING FIBER IN THE DIET  Start adding fiber to the diet slowly. A gradual increase of about 5 more grams (2 slices of whole-wheat bread, 2 servings of most fruits or vegetables, or 1 bowl of high-fiber cereal) per day is best. Too rapid an increase in fiber may result in constipation, flatulence, and bloating.   Drink enough water and fluids to keep your urine clear or pale yellow. Water, juice, or caffeine-free drinks are recommended. Not drinking enough fluid may cause constipation.   Eat a variety of high-fiber foods rather than one type of fiber.   Try to increase your intake of fiber through using high-fiber foods rather than fiber pills or supplements that contain small amounts of fiber.   The goal is to change the types of food eaten. Do not supplement your present diet with high-fiber foods, but replace foods in your present diet.   INCLUDE A VARIETY OF FIBER SOURCES  Replace refined and processed grains with whole grains, canned fruits with fresh fruits, and incorporate other fiber sources. White rice, white breads, and most bakery goods contain little or no fiber.   Brown whole-grain rice, buckwheat oats, and many fruits and vegetables are all good sources of fiber. These include: broccoli, Brussels sprouts, cabbage, cauliflower, beets, sweet potatoes, white potatoes (skin on), carrots, tomatoes, eggplant, squash, berries, fresh fruits, and dried fruits.   Cereals appear to be the richest source of fiber. Cereal fiber is found in whole grains and bran. Bran is the fiber-rich outer coat of cereal grain, which is largely removed in refining. In whole-grain cereals, the bran remains. In breakfast cereals, the largest amount of fiber is found in those with "bran" in their names. The fiber content is  sometimes indicated on the label.   You may need to include additional fruits and vegetables each day.   In baking, for 1 cup white flour, you may use the following substitutions:   1 cup whole-wheat flour minus 2 tablespoons.   1/2 cup white flour plus 1/2 cup whole-wheat flour.   Polyps, Colon  A polyp is extra tissue that grows inside your body. Colon polyps grow in the large intestine. The large intestine, also called the colon, is part of your digestive system. It is a long, hollow tube at the end of your digestive tract where your body makes and stores stool. Most polyps are not dangerous. They are benign. This means they are not cancerous. But over time, some types of polyps can turn into cancer. Polyps that are smaller than a pea are usually not harmful. But larger polyps could someday become or may already be cancerous. To be safe, doctors remove all polyps and test them.   WHO GETS POLYPS? Anyone can get polyps, but certain people  are more likely than others. You may have a greater chance of getting polyps if:  You are over 50.   You have had polyps before.   Someone in your family has had polyps.   Someone in your family has had cancer of the large intestine.   Find out if someone in your family has had polyps. You may also be more likely to get polyps if you:   Eat a lot of fatty foods   Smoke   Drink alcohol   Do not exercise  Eat too much     PREVENTION There is not one sure way to prevent polyps. You might be able to lower your risk of getting them if you:  Eat more fruits and vegetables and less fatty food.   Do not smoke.   Avoid alcohol.   Exercise every day.   Lose weight if you are overweight.   Eating more calcium and folate can also lower your risk of getting polyps. Some foods that are rich in calcium are milk, cheese, and broccoli. Some foods that are rich in folate are chickpeas, kidney beans, and spinach.    Diverticulosis Diverticulosis  is a common condition that develops when small pouches (diverticula) form in the wall of the colon. The risk of diverticulosis increases with age. It happens more often in people who eat a low-fiber diet. Most individuals with diverticulosis have no symptoms. Those individuals with symptoms usually experience belly (abdominal) pain, constipation, or loose stools (diarrhea).  HOME CARE INSTRUCTIONS  Increase the amount of fiber in your diet as directed by your caregiver or dietician. This may reduce symptoms of diverticulosis.   Drink at least 6 to 8 glasses of water each day to prevent constipation.   Try not to strain when you have a bowel movement.   Avoiding nuts and seeds to prevent complications is NOT NECESSARY.   FOODS HAVING HIGH FIBER CONTENT INCLUDE:  Fruits. Apple, peach, pear, tangerine, raisins, prunes.   Vegetables. Brussels sprouts, asparagus, broccoli, cabbage, carrot, cauliflower, romaine lettuce, spinach, summer squash, tomato, winter squash, zucchini.   Starchy Vegetables. Baked beans, kidney beans, lima beans, split peas, lentils, potatoes (with skin).   Grains. Whole wheat bread, brown rice, bran flake cereal, plain oatmeal, white rice, shredded wheat, bran muffins.    SEEK IMMEDIATE MEDICAL CARE IF:  You develop increasing pain or severe bloating.   You have an oral temperature above 101F.   You develop vomiting or bowel movements that are bloody or black.

## 2016-10-10 NOTE — H&P (Signed)
Primary Care Physician:  Fayrene Helper, MD Primary Gastroenterologist:  Dr. Oneida Alar  Pre-Procedure History & Physical: HPI:  Tiffany Jacobs is a 71 y.o. female here for [place].  Past Medical History:  Diagnosis Date  . Arthritis   . Asthma 1963  . Diabetes mellitus, type 2 ([place]) 2004  . GERD (gastroesophageal reflux disease) 2004  . Hypertension 2004  . Low back pain   . Obesity   . RAD (reactive airway disease)   . Sciatica   . Seasonal allergies    Past Surgical History:  Procedure Laterality Date  . ACROMIO-CLAVICULAR JOINT REPAIR Right 11/09/2012   Procedure: ACROMIO-CLAVICULAR JOINT REPAIR;  Surgeon: Carole Civil, MD;  Location: [facility];  Service: Orthopedics;  Laterality: Right;  . left breast biopsy for benign disease    . SHOULDER ACROMIOPLASTY Right 11/09/2012   Procedure: RIGHT SHOULDER ACROMIOPLASTY;  Surgeon: Carole Civil, MD;  Location: [facility];  Service: Orthopedics;  Laterality: Right;  . SHOULDER OPEN ROTATOR CUFF REPAIR Right 11/09/2012   Procedure: ROTATOR CUFF REPAIR Right SHOULDER OPEN;  Surgeon: Carole Civil, MD;  Location: [facility];  Service: Orthopedics;  Laterality: Right;  . total knee arthroplasty left  02-01-05   Dr. Aline Brochure  . TUBAL LIGATION  1970    Prior to Admission medications   Medication Sig Start Date End Date Taking? Authorizing Provider  acetaminophen (TYLENOL) 650 MG CR tablet Take 1 tablet (650 mg total) by mouth every 8 (eight) hours as needed for pain. Reported on 08/27/2015 03/10/16  Yes Fayrene Helper, MD  albuterol (PROVENTIL HFA;VENTOLIN HFA) 108 (90 BASE) MCG/ACT inhaler Inhale 2 puffs into the lungs every 6 (six) hours as needed for wheezing or shortness of breath. 01/13/14  Yes Fayrene Helper, MD  albuterol (PROVENTIL) (2.5 MG/3ML) 0.083% nebulizer solution Take 2.5 mg by nebulization every 6 (six) hours as needed for wheezing or shortness of breath.   Yes [provider]   amLODipine (NORVASC) 10 MG tablet Take 1 tablet (10 mg total) by mouth daily. 08/28/15  Yes Fayrene Helper, MD  aspirin (ASPIRIN LOW DOSE) 81 MG EC tablet Take 81 mg by mouth every morning.    Yes [provider]  cholecalciferol (VITAMIN D) 1000 units tablet Take 1,000 Units by mouth daily.   Yes [provider]  Cyanocobalamin (B-12 PO) Take 1 capsule by mouth daily as needed (energy).   Yes [provider]  fluticasone (FLONASE) 50 MCG/ACT nasal spray USE 2 SPRAYS IN EACH NOSTRIL EVERY DAY Patient taking differently: USE 2 SPRAYS IN EACH NOSTRIL DAILY AS NEEDED FOR ALLERGIES 12/11/15  Yes Fayrene Helper, MD  gabapentin (NEURONTIN) 300 MG capsule Take 1 capsule (300 mg total) by mouth at bedtime. 06/27/16  Yes Fayrene Helper, MD  glipiZIDE (GLUCOTROL XL) 10 MG 24 hr tablet TAKE 2 TABLETS EVERY DAY WITH BREAKFAST 06/13/16  Yes Fayrene Helper, MD  hydrALAZINE (APRESOLINE) 25 MG tablet TAKE 1 TABLET THREE TIMES DAILY 07/27/16  Yes Fayrene Helper, MD  hydrocortisone cream 1 % Apply 1 application topically daily as needed for itching (itching).   Yes [provider]  metFORMIN (GLUCOPHAGE) 1000 MG tablet TAKE 1 TABLET TWICE DAILY WITH A MEAL 08/31/16  Yes Fayrene Helper, MD  montelukast (SINGULAIR) 10 MG tablet TAKE 1 TABLET AT BEDTIME 07/27/16  Yes Fayrene Helper, MD  polyethylene glycol-electrolytes (TRILYTE) 420 g solution Take 4,000 mLs by mouth as directed. 09/14/16  Yes  Dazani Norby L, MD  pravastatin (PRAVACHOL) 20 MG tablet TAKE 1 TABLET EVERY DAY 07/27/16  Yes Fayrene Helper, MD  spironolactone (ALDACTONE) 100 MG tablet Take 1 tablet (100 mg total) by mouth daily. 08/28/15  Yes Fayrene Helper, MD  vitamin C (ASCORBIC ACID) 500 MG tablet Take 500 mg by mouth daily.   Yes [provider]  zolpidem (AMBIEN) 10 MG tablet Take 1 tablet (10 mg total) by mouth at bedtime as needed. Patient taking differently: Take 10 mg  by mouth at bedtime.  06/27/16  Yes Fayrene Helper, MD  glucose blood (TRUE METRIX BLOOD GLUCOSE TEST) test strip Use as instructed three times daily dx e11.65 04/07/16   Fayrene Helper, MD  glucose blood test strip Use as instructed once daily dx E11.9 04/01/16   Fayrene Helper, MD  insulin NPH Human (NOVOLIN N RELION) 100 UNIT/ML injection Inject 0.07 mLs (7 Units total) into the skin 2 (two) times daily before a meal. Take 7 units twice daily. 08/05/16   Fayrene Helper, MD  [facility] DELICA LANCETS 16X MISC Three times daily testing dx E11.65 12/11/14   Fayrene Helper, MD  TRUE METRIX BLOOD GLUCOSE TEST test strip USE AS INSTRUCTED THREE TIMES DAILY  08/31/16   Fayrene Helper, MD    Allergies as of 09/14/2016 - Review Complete 08/23/2016  Allergen Reaction Noted  . Ace inhibitors Cough 11/04/2011  . Latex Itching and Rash 05/04/2014    Family History  Problem Relation Age of Onset  . Kidney failure Father   . Kidney disease Father   . Stroke Brother 67       used drugs , stroke and heart disease  . Diabetes Brother   . Hypertension Brother   . Cancer Daughter 62       breast   . Diabetes Mother   . Asthma Unknown   . Lung disease Unknown   . Cancer Unknown   . Heart disease Unknown   . Arthritis Unknown   . Colon cancer Neg Hx   . Colon polyps Neg Hx     Social History   Social History  . Marital status: Married    Spouse name: N/A  . Number of children: 7  . Years of education: N/A   Occupational History  . disabled     Social History Main Topics  . Smoking status: Former Smoker    Packs/day: 1.00    Years: 30.00    Types: Cigarettes  . Smokeless tobacco: Former Systems developer    Quit date: 11/02/1988  . Alcohol use No  . Drug use: No  . Sexual activity: Yes    Birth control/ protection: Post-menopausal   Other Topics Concern  . Not on file   Social History Narrative  . No narrative on file    Review of Systems: See HPI, otherwise  negative ROS   Physical Exam: BP 115/87   Pulse 90   Temp 98.5 F (36.9 C) (Oral)   Resp 16   Ht 5\' 2"  (1.575 m)   Wt 206 lb (93.4 kg)   SpO2 96%   BMI 37.68 kg/m  General:   Alert,  pleasant and cooperative in NAD Head:  Normocephalic and atraumatic. Neck:  Supple; Lungs:  Clear throughout to auscultation.    Heart:  Regular rate and rhythm. Abdomen:  Soft, nontender and nondistended. Normal bowel sounds, without guarding, and without rebound.   Neurologic:  Alert and  oriented x4;  grossly normal neurologically.  Impression/Plan:     SCREENING  Plan:  1. TCS TODAY. DISCUSSED PROCEDURE, BENEFITS, & RISKS: < 1% chance of medication reaction, bleeding, perforation, or rupture of spleen/liver.

## 2016-10-10 NOTE — Op Note (Signed)
[facility] Patient Name: Tiffany Jacobs Procedure Date: 10/10/2016 9:50 AM MRN: [id number] Date of Birth: 02-Mar-1946 Attending MD: Barney Drain , MD CSN: [id number] Age: 71 Admit Type: Outpatient Procedure:                Colonoscopy, WITH COLD FORCEPS/SNARE & SNARE                            CAUTERY POLYPECTOMY Indications:              Screening for colorectal malignant neoplasm Providers:                Barney Drain, MD, Rosina Lowenstein, RN, Claudius Sis,                            Technologist Referring MD:             Norwood Levo. Simpson MD, MD Medicines:                Meperidine 50 mg IV, Midazolam 5 mg IV Complications:            No immediate complications. Estimated Blood Loss:     Estimated blood loss was minimal. Procedure:                Pre-Anesthesia Assessment:                           - Prior to the procedure, a History and Physical                            was performed, and patient medications and                            allergies were reviewed. The patient's tolerance of                            previous anesthesia was also reviewed. The risks                            and benefits of the procedure and the sedation                            options and risks were discussed with the patient.                            All questions were answered, and informed consent                            was obtained. Prior Anticoagulants: The patient has                            taken aspirin, last dose was 1 week prior to                            procedure. ASA Grade Assessment: II - A patient  with mild systemic disease. After reviewing the                            risks and benefits, the patient was deemed in                            satisfactory condition to undergo the procedure.                            After obtaining informed consent, the colonoscope                            was passed under direct vision. Throughout  the                            procedure, the patient's blood pressure, pulse, and                            oxygen saturations were monitored continuously. The                            EC-[id number] ([id number]) scope was introduced through                            the anus and advanced to the the cecum, identified                            by appendiceal orifice and ileocecal valve. The                            colonoscopy was somewhat difficult due to a                            tortuous colon and the patient's discomfort during                            the procedure. Successful completion of the                            procedure was aided by increasing the dose of                            sedation medication and COLOWRAP. The patient                            tolerated the procedure fairly well. The quality of                            the bowel preparation was good. The ileocecal                            valve, appendiceal orifice, and rectum were  photographed. Scope In: 10:26:34 AM Scope Out: 10:53:41 AM Scope Withdrawal Time: 0 hours 23 minutes 54 seconds  Total Procedure Duration: 0 hours 27 minutes 7 seconds  Findings:      Three sessile polyps were found in the descending colon and splenic       flexure. The polyps were 5 to 7 mm in size. These polyps were removed       with a hot snare. Resection and retrieval were complete.      A 3 mm polyp was found in the mid descending colon. The polyp was       sessile. The polyp was removed with a cold biopsy forceps. Resection and       retrieval were complete.      Three sessile polyps were found in the rectum. The polyps were 5 to 6 mm       in size. These polyps were removed with a cold snare. Resection and       retrieval were complete.      Multiple small and large-mouthed diverticula were found in the       recto-sigmoid colon, sigmoid colon, descending colon and ascending colon.      The  recto-sigmoid colon, sigmoid colon and descending colon were       moderately redundant. Impression:               - Three 5 to 7 mm polyps in the descending colon                            and at the splenic flexure, removed with a hot                            snare. Resected and retrieved.                           - One 3 mm polyp in the mid descending colon,                            removed with a cold biopsy forceps. Resected and                            retrieved.                           - Three 5 to 6 mm polyps in the rectum, removed                            with a cold snare. Resected and retrieved.                           - External and internal hemorrhoids. Moderate Sedation:      Moderate (conscious) sedation was administered by the endoscopy nurse       and supervised by the endoscopist. The following parameters were       monitored: oxygen saturation, heart rate, blood pressure, and response       to care. Total physician intraservice time was 37 minutes. Recommendation:           - Repeat colonoscopy in 3 years for surveillance.                           -  High fiber diet.                           - Continue present medications.                           - Await pathology results.                           - Patient has a contact number available for                            emergencies. The signs and symptoms of potential                            delayed complications were discussed with the                            patient. Return to normal activities tomorrow.                            Written discharge instructions were provided to the                            patient. Procedure Code(s):        --- Professional ---                           [phone], Colonoscopy, flexible; with removal of                            tumor(s), polyp(s), or other lesion(s) by snare                            technique                           45380, 59, Colonoscopy,  flexible; with biopsy,                            single or multiple                           99152, Moderate sedation services provided by the                            same physician or other qualified health care                            professional performing the diagnostic or                            therapeutic service that the sedation supports,                            requiring the presence of an independent trained  observer to assist in the monitoring of the                            patient's level of consciousness and physiological                            status; initial 15 minutes of intraservice time,                            patient age 71 years or older                           [id number], Moderate sedation services; each additional                            15 minutes intraservice time Diagnosis Code(s):        --- Professional ---                           Z12.11, Encounter for screening for malignant                            neoplasm of colon                           D12.4, Benign neoplasm of descending colon                           D12.3, Benign neoplasm of transverse colon (hepatic                            flexure or splenic flexure)                           K62.1, Rectal polyp                           K64.8, Other hemorrhoids CPT copyright 2016 [employer]. All rights reserved. The codes documented in this report are preliminary and upon coder review may  be revised to meet current compliance requirements. Barney Drain, MD Barney Drain, MD 10/10/2016 11:05:07 AM This report has been signed electronically. Number of Addenda: 0

## 2016-10-12 ENCOUNTER — Ambulatory Visit (INDEPENDENT_AMBULATORY_CARE_PROVIDER_SITE_OTHER): Payer: Medicare HMO

## 2016-10-12 VITALS — BP 132/74 | HR 100 | Temp 98.6°F | Ht 62.0 in | Wt 204.1 lb

## 2016-10-12 DIAGNOSIS — Z0000 Encounter for general adult medical examination without abnormal findings: Secondary | ICD-10-CM

## 2016-10-12 NOTE — Patient Instructions (Addendum)
Tiffany Jacobs , Thank you for taking time to come for your Medicare Wellness Visit. I appreciate your ongoing commitment to your health goals. Please review the following plan we discussed and let me know if I can assist you in the future.   Screening recommendations/referrals: Colonoscopy: Up to date, next due 10/2026 Mammogram: Up to date, next due 09/2017 Bone Density: Up to date Diabetic Eye Exam: Up to date, we will request records from [facility] In [place] Recommended yearly dental visit for hygiene and checkup  Vaccinations: Influenza vaccine: Due next month Pneumococcal vaccine: Up to date Tdap vaccine: Due, declines Shingles vaccine: Declines    Advanced directives: Advance directive discussed with you today. Even though you declined this today please call our office if you change your mind and we will provide you with the paperwork to complete.  Conditions/risks identified: Obese, recommend cutting back on fried foods by possibly using a air fryer. Also recommend cutting back on soft drinks and foods that are high in sugar.  Next appointment: Follow up with Tiffany Jacobs on 10/31/2016 at 3:40 pm. Please complete lab work one week prior to your appointment with Tiffany Jacobs. Follow up in 1 year for your annual wellness visit.  Preventive Care 71 Years and Older, Female Preventive care refers to lifestyle choices and visits with your health care provider that can promote health and wellness. What does preventive care include?  A yearly physical exam. This is also called an annual well check.  Dental exams once or twice a year.  Routine eye exams. Ask your health care provider how often you should have your eyes checked.  Personal lifestyle choices, including:  Daily care of your teeth and gums.  Regular physical activity.  Eating a healthy diet.  Avoiding tobacco and drug use.  Limiting alcohol use.  Practicing safe sex.  Taking low-dose aspirin every day.  Taking  vitamin and mineral supplements as recommended by your health care provider. What happens during an annual well check? The services and screenings done by your health care provider during your annual well check will depend on your age, overall health, lifestyle risk factors, and family history of disease. Counseling  Your health care provider may ask you questions about your:  Alcohol use.  Tobacco use.  Drug use.  Emotional well-being.  Home and relationship well-being.  Sexual activity.  Eating habits.  History of falls.  Memory and ability to understand (cognition).  Work and work Statistician.  Reproductive health. Screening  You may have the following tests or measurements:  Height, weight, and BMI.  Blood pressure.  Lipid and cholesterol levels. These may be checked every 5 years, or more frequently if you are over 71 years old.  Skin check.  Lung cancer screening. You may have this screening every year starting at age 71 if you have a 30-pack-year history of smoking and currently smoke or have quit within the past 15 years.  Fecal occult blood test (FOBT) of the stool. You may have this test every year starting at age 71.  Flexible sigmoidoscopy or colonoscopy. You may have a sigmoidoscopy every 5 years or a colonoscopy every 10 years starting at age 71.  Hepatitis C blood test.  Hepatitis B blood test.  Sexually transmitted disease (STD) testing.  Diabetes screening. This is done by checking your blood sugar (glucose) after you have not eaten for a while (fasting). You may have this done every 1-3 years.  Bone density scan. This is done  to screen for osteoporosis. You may have this done starting at age 71.  Mammogram. This may be done every 1-2 years. Talk to your health care provider about how often you should have regular mammograms. Talk with your health care provider about your test results, treatment options, and if necessary, the need for more  tests. Vaccines  Your health care provider may recommend certain vaccines, such as:  Influenza vaccine. This is recommended every year.  Tetanus, diphtheria, and acellular pertussis (Tdap, Td) vaccine. You may need a Td booster every 10 years.  Zoster vaccine. You may need this after age 24.  Pneumococcal 13-valent conjugate (PCV13) vaccine. One dose is recommended after age 71.  Pneumococcal polysaccharide (PPSV23) vaccine. One dose is recommended after age 71. Talk to your health care provider about which screenings and vaccines you need and how often you need them. This information is not intended to replace advice given to you by your health care provider. Make sure you discuss any questions you have with your health care provider. Document Released: 03/20/2015 Document Revised: 11/11/2015 Document Reviewed: 12/23/2014 Elsevier Interactive Patient Education  2017 [place] Prevention in the Home Falls can cause injuries. They can happen to people of all ages. There are many things you can do to make your home safe and to help prevent falls. What can I do on the outside of my home?  Regularly fix the edges of walkways and driveways and fix any cracks.  Remove anything that might make you trip as you walk through a door, such as a raised step or threshold.  Trim any bushes or trees on the path to your home.  Use bright outdoor lighting.  Clear any walking paths of anything that might make someone trip, such as rocks or tools.  Regularly check to see if handrails are loose or broken. Make sure that both sides of any steps have handrails.  Any raised decks and porches should have guardrails on the edges.  Have any leaves, snow, or ice cleared regularly.  Use sand or salt on walking paths during winter.  Clean up any spills in your garage right away. This includes oil or grease spills. What can I do in the bathroom?  Use night lights.  Install grab bars by the  toilet and in the tub and shower. Do not use towel bars as grab bars.  Use non-skid mats or decals in the tub or shower.  If you need to sit down in the shower, use a plastic, non-slip stool.  Keep the floor dry. Clean up any water that spills on the floor as soon as it happens.  Remove soap buildup in the tub or shower regularly.  Attach bath mats securely with double-sided non-slip rug tape.  Do not have throw rugs and other things on the floor that can make you trip. What can I do in the bedroom?  Use night lights.  Make sure that you have a light by your bed that is easy to reach.  Do not use any sheets or blankets that are too big for your bed. They should not hang down onto the floor.  Have a firm chair that has side arms. You can use this for support while you get dressed.  Do not have throw rugs and other things on the floor that can make you trip. What can I do in the kitchen?  Clean up any spills right away.  Avoid walking on wet floors.  Keep items  that you use a lot in easy-to-reach places.  If you need to reach something above you, use a strong step stool that has a grab bar.  Keep electrical cords out of the way.  Do not use floor polish or wax that makes floors slippery. If you must use wax, use non-skid floor wax.  Do not have throw rugs and other things on the floor that can make you trip. What can I do with my stairs?  Do not leave any items on the stairs.  Make sure that there are handrails on both sides of the stairs and use them. Fix handrails that are broken or loose. Make sure that handrails are as long as the stairways.  Check any carpeting to make sure that it is firmly attached to the stairs. Fix any carpet that is loose or worn.  Avoid having throw rugs at the top or bottom of the stairs. If you do have throw rugs, attach them to the floor with carpet tape.  Make sure that you have a light switch at the top of the stairs and the bottom of  the stairs. If you do not have them, ask someone to add them for you. What else can I do to help prevent falls?  Wear shoes that:  Do not have high heels.  Have rubber bottoms.  Are comfortable and fit you well.  Are closed at the toe. Do not wear sandals.  If you use a stepladder:  Make sure that it is fully opened. Do not climb a closed stepladder.  Make sure that both sides of the stepladder are locked into place.  Ask someone to hold it for you, if possible.  Clearly mark and make sure that you can see:  Any grab bars or handrails.  First and last steps.  Where the edge of each step is.  Use tools that help you move around (mobility aids) if they are needed. These include:  Canes.  Walkers.  Scooters.  Crutches.  Turn on the lights when you go into a dark area. Replace any light bulbs as soon as they burn out.  Set up your furniture so you have a clear path. Avoid moving your furniture around.  If any of your floors are uneven, fix them.  If there are any pets around you, be aware of where they are.  Review your medicines with your doctor. Some medicines can make you feel dizzy. This can increase your chance of falling. Ask your doctor what other things that you can do to help prevent falls. This information is not intended to replace advice given to you by your health care provider. Make sure you discuss any questions you have with your health care provider. Document Released: 12/18/2008 Document Revised: 07/30/2015 Document Reviewed: 03/28/2014 Elsevier Interactive Patient Education  2017 [employer].

## 2016-10-12 NOTE — Progress Notes (Signed)
Subjective:   Tiffany Jacobs is a 71 y.o. female who presents for Medicare Annual (Subsequent) preventive examination.  Review of Systems:  Cardiac Risk Factors include: advanced age (>17men, >67 women);diabetes mellitus;dyslipidemia;hypertension;obesity (BMI >30kg/m2)     Objective:     Vitals: BP 132/74   Pulse 100   Temp 98.6 F (37 C) (Oral)   Ht 5\' 2"  (1.575 m)   Wt 204 lb 1.3 oz (92.6 kg)   BMI 37.33 kg/m   Body mass index is 37.33 kg/m.   Tobacco History  Smoking Status  . Former Smoker  . Packs/day: 0.50  . Years: 37.00  . Types: Cigarettes  . Quit date: 10/12/1988  Smokeless Tobacco  . Never Used     Counseling given: Not Answered   Past Medical History:  Diagnosis Date  . Arthritis   . Asthma 1963  . Diabetes mellitus, type 2 ([place]) 2004  . GERD (gastroesophageal reflux disease) 2004  . Hypertension 2004  . Low back pain   . Obesity   . RAD (reactive airway disease)   . Sciatica   . Seasonal allergies    Past Surgical History:  Procedure Laterality Date  . ACROMIO-CLAVICULAR JOINT REPAIR Right 11/09/2012   Procedure: ACROMIO-CLAVICULAR JOINT REPAIR;  Surgeon: Carole Civil, MD;  Location: [facility];  Service: Orthopedics;  Laterality: Right;  . left breast biopsy for benign disease    . SHOULDER ACROMIOPLASTY Right 11/09/2012   Procedure: RIGHT SHOULDER ACROMIOPLASTY;  Surgeon: Carole Civil, MD;  Location: [facility];  Service: Orthopedics;  Laterality: Right;  . SHOULDER OPEN ROTATOR CUFF REPAIR Right 11/09/2012   Procedure: ROTATOR CUFF REPAIR Right SHOULDER OPEN;  Surgeon: Carole Civil, MD;  Location: [facility];  Service: Orthopedics;  Laterality: Right;  . total knee arthroplasty left  02-01-05   Dr. Aline Brochure  . TUBAL LIGATION  1970   Family History  Problem Relation Age of Onset  . Kidney failure Father   . Kidney disease Father   . Stroke Brother 80       used drugs , stroke and heart disease  . Diabetes Brother   .  Hypertension Brother   . Cancer Daughter 15       breast   . Diabetes Mother   . Asthma Unknown   . Lung disease Unknown   . Cancer Unknown   . Heart disease Unknown   . Arthritis Unknown   . Colon cancer Neg Hx   . Colon polyps Neg Hx    History  Sexual Activity  . Sexual activity: Yes  . Birth control/ protection: Post-menopausal    Outpatient Encounter Prescriptions as of 10/12/2016  Medication Sig  . acetaminophen (TYLENOL) 650 MG CR tablet Take 1 tablet (650 mg total) by mouth every 8 (eight) hours as needed for pain. Reported on 08/27/2015  . albuterol (PROVENTIL HFA;VENTOLIN HFA) 108 (90 BASE) MCG/ACT inhaler Inhale 2 puffs into the lungs every 6 (six) hours as needed for wheezing or shortness of breath.  Marland Kitchen albuterol (PROVENTIL) (2.5 MG/3ML) 0.083% nebulizer solution Take 2.5 mg by nebulization every 6 (six) hours as needed for wheezing or shortness of breath.  Marland Kitchen amLODipine (NORVASC) 10 MG tablet Take 1 tablet (10 mg total) by mouth daily.  Marland Kitchen aspirin (ASPIRIN LOW DOSE) 81 MG EC tablet Take 81 mg by mouth every morning.   . cholecalciferol (VITAMIN D) 1000 units tablet Take 1,000 Units by mouth daily.  . Cyanocobalamin (B-12 PO) Take 1 capsule by  mouth daily as needed (energy).  . fluticasone (FLONASE) 50 MCG/ACT nasal spray USE 2 SPRAYS IN EACH NOSTRIL EVERY DAY (Patient taking differently: USE 2 SPRAYS IN EACH NOSTRIL DAILY AS NEEDED FOR ALLERGIES)  . gabapentin (NEURONTIN) 300 MG capsule Take 1 capsule (300 mg total) by mouth at bedtime.  Marland Kitchen glipiZIDE (GLUCOTROL XL) 10 MG 24 hr tablet TAKE 2 TABLETS EVERY DAY WITH BREAKFAST  . glucose blood (TRUE METRIX BLOOD GLUCOSE TEST) test strip Use as instructed three times daily dx e11.65  . glucose blood test strip Use as instructed once daily dx E11.9  . hydrALAZINE (APRESOLINE) 25 MG tablet TAKE 1 TABLET THREE TIMES DAILY  . hydrocortisone cream 1 % Apply 1 application topically daily as needed for itching (itching).  . metFORMIN  (GLUCOPHAGE) 1000 MG tablet TAKE 1 TABLET TWICE DAILY WITH A MEAL  . montelukast (SINGULAIR) 10 MG tablet TAKE 1 TABLET AT BEDTIME  . ONETOUCH DELICA LANCETS 02I MISC Three times daily testing dx E11.65  . pravastatin (PRAVACHOL) 20 MG tablet TAKE 1 TABLET EVERY DAY  . spironolactone (ALDACTONE) 100 MG tablet Take 1 tablet (100 mg total) by mouth daily.  . TRUE METRIX BLOOD GLUCOSE TEST test strip USE AS INSTRUCTED THREE TIMES DAILY   . vitamin C (ASCORBIC ACID) 500 MG tablet Take 500 mg by mouth daily.  Marland Kitchen zolpidem (AMBIEN) 10 MG tablet Take 1 tablet (10 mg total) by mouth at bedtime as needed. (Patient taking differently: Take 10 mg by mouth at bedtime. )  . insulin NPH Human (NOVOLIN N RELION) 100 UNIT/ML injection Inject 0.07 mLs (7 Units total) into the skin 2 (two) times daily before a meal. Take 7 units twice daily. (Patient not taking: Reported on 10/12/2016)   No facility-administered encounter medications on file as of 10/12/2016.     Activities of Daily Living In your present state of health, do you have any difficulty performing the following activities: 10/12/2016 08/03/2016  Hearing? N N  Vision? N N  Difficulty concentrating or making decisions? N N  Walking or climbing stairs? N N  Dressing or bathing? N N  Doing errands, shopping? N N  Preparing Food and eating ? N -  Using the Toilet? N -  In the past six months, have you accidently leaked urine? N -  Do you have problems with loss of bowel control? N -  Managing your Medications? N -  Managing your Finances? N -  Housekeeping or managing your Housekeeping? N -  Some recent data might be hidden    Patient Care Team: Fayrene Helper, MD as PCP - General Carole Civil, MD as Consulting Physician (Orthopedic Surgery) Leta Baptist, MD as Consulting Physician (Otolaryngology)    Assessment:    Exercise Activities and Dietary recommendations Current Exercise Habits: Home exercise routine, Type of exercise:  stretching;strength training/weights, Time (Minutes): 10, Frequency (Times/Week): 7, Weekly Exercise (Minutes/Week): 70, Intensity: Mild, Exercise limited by: None identified  Goals    . Have 3 meals a day    . Reduce fried foods          Recommend decreasing the amount of foods that are fried. Discussed using an air fryer in place of a deep fryer.      Fall Risk Fall Risk  10/12/2016 08/03/2016 10/27/2015 10/08/2015 08/27/2015  Falls in the past year? No No No No No  Risk for fall due to : - - - Impaired mobility -  Risk for fall due to: Comment - - -  has neuropathy in feet -   Depression Screen PHQ 2/9 Scores 10/12/2016 08/03/2016 10/27/2015 10/08/2015  PHQ - 2 Score 0 0 0 0  PHQ- 9 Score - - - -     Cognitive Function: Normal   6CIT Screen 10/12/2016  What Year? 0 points  What month? 0 points  What time? 0 points  Count back from 20 0 points  Months in reverse 0 points  Repeat phrase 0 points  Total Score 0    Immunization History  Administered Date(s) Administered  . Influenza Whole 03/29/2006, 11/27/2009  . Influenza,inj,Quad PF,36+ Mos 03/20/2013, 01/24/2014, 12/25/2014, 01/22/2016  . Pneumococcal Conjugate-13 06/06/2014  . Pneumococcal Polysaccharide-23 08/27/2004, 11/27/2009, 08/27/2015  . Td 08/27/2004   Screening Tests:  Health Maintenance  Topic Date Due  . OPHTHALMOLOGY EXAM  06/19/2015  . FOOT EXAM  08/27/2016  . URINE MICROALBUMIN  08/27/2016  . INFLUENZA VACCINE  10/05/2016  . TETANUS/TDAP  10/20/2016 (Originally 08/28/2014)  . HEMOGLOBIN A1C  01/22/2017  . MAMMOGRAM  09/30/2018  . COLONOSCOPY  10/11/2026  . DEXA SCAN  Completed  . Hepatitis C Screening  Completed  . PNA vac Low Risk Adult  Completed   Per patient Diabetic Eye Exam is up to date, will request records from [facility] in [place].    Plan:   I have personally reviewed and noted the following in the patient's chart:   . Medical and social history . Use of alcohol, tobacco or illicit drugs    . Current medications and supplements . Functional ability and status . Nutritional status . Physical activity . Advanced directives . List of other physicians . Hospitalizations, surgeries, and ER visits in previous 12 months . Vitals . Screenings to include cognitive, depression, and falls . Referrals and appointments  In addition, I have reviewed and discussed with patient certain preventive protocols, quality metrics, and best practice recommendations. A written personalized care plan for preventive services as well as general preventive health recommendations were provided to patient.     Stormy Fabian, LPN  [id number]/[id number]

## 2016-10-12 NOTE — Progress Notes (Signed)
LMOM to call.

## 2016-10-13 ENCOUNTER — Ambulatory Visit: Payer: Medicare HMO

## 2016-10-13 ENCOUNTER — Encounter (HOSPITAL_COMMUNITY): Payer: Self-pay | Admitting: Gastroenterology

## 2016-10-13 ENCOUNTER — Other Ambulatory Visit: Payer: Self-pay

## 2016-10-13 DIAGNOSIS — M5010 Cervical disc disorder with radiculopathy, unspecified cervical region: Secondary | ICD-10-CM

## 2016-10-13 MED ORDER — ZOLPIDEM TARTRATE 10 MG PO TABS: 10.0000 mg | ORAL_TABLET | Freq: Every evening | ORAL | 0 refills | Status: DC | PRN

## 2016-10-13 MED ORDER — GABAPENTIN 300 MG PO CAPS: 300.0000 mg | ORAL_CAPSULE | Freq: Every day | ORAL | 1 refills | Status: DC

## 2016-10-13 NOTE — Progress Notes (Signed)
Letter mailed for pt to call for results.  

## 2016-10-17 ENCOUNTER — Telehealth: Payer: Self-pay | Admitting: *Deleted

## 2016-10-17 NOTE — Telephone Encounter (Signed)
Patient called stating she did not receive her sleeping medicine but she did receive the gabapentin. Please advise

## 2016-10-17 NOTE — Telephone Encounter (Signed)
Re faxed ambien to [facility]

## 2016-10-18 ENCOUNTER — Telehealth: Payer: Self-pay

## 2016-10-18 NOTE — Telephone Encounter (Signed)
Pt is calling about her TCS results. Please advise

## 2016-10-19 ENCOUNTER — Telehealth: Payer: Self-pay | Admitting: Gastroenterology

## 2016-10-19 NOTE — Telephone Encounter (Signed)
Left message with husband.

## 2016-10-19 NOTE — Telephone Encounter (Signed)
PT is aware of her results from her procedure.

## 2016-10-19 NOTE — Telephone Encounter (Signed)
PLEASE CALL PT. ATTEMPTED TO CONTACT HER WITH RESULTS ON AUG 8. She had FOUR simple adenomas AND THREE HYPERPLASTIC POLYPS removed. FOLLOW A HIGH FIBER DIET. NEXT COLONOSCOPY in 3 years.

## 2016-10-19 NOTE — Telephone Encounter (Signed)
Pt received a letter from [facility] about her results and was calling to speak with DS. [phone]

## 2016-10-24 DIAGNOSIS — E118 Type 2 diabetes mellitus with unspecified complications: Secondary | ICD-10-CM | POA: Diagnosis not present

## 2016-10-24 DIAGNOSIS — E1165 Type 2 diabetes mellitus with hyperglycemia: Secondary | ICD-10-CM | POA: Diagnosis not present

## 2016-10-24 DIAGNOSIS — I10 Essential (primary) hypertension: Secondary | ICD-10-CM | POA: Diagnosis not present

## 2016-10-25 LAB — COMPLETE METABOLIC PANEL WITH GFR
ALBUMIN: 4.1 g/dL (ref 3.6–5.1)
ALK PHOS: 79 U/L (ref 33–130)
ALT: 16 U/L (ref 6–29)
AST: 19 U/L (ref 10–35)
BILIRUBIN TOTAL: 0.4 mg/dL (ref 0.2–1.2)
BUN: 10 mg/dL (ref 7–25)
CO2: 26 mmol/L (ref 20–32)
CREATININE: 0.75 mg/dL (ref 0.60–0.93)
Calcium: 9.8 mg/dL (ref 8.6–10.4)
Chloride: 102 mmol/L (ref 98–110)
GFR, Est African American: >89 mL/min (ref >=60)
GFR, Est Non African American: 80 mL/min (ref >=60)
GLUCOSE: 198 mg/dL — AB (ref 65–99)
Potassium: 4.6 mmol/L (ref 3.5–5.3)
Sodium: 139 mmol/L (ref 135–146)
TOTAL PROTEIN: 7 g/dL (ref 6.1–8.1)

## 2016-10-25 LAB — HEMOGLOBIN A1C
Hgb A1c MFr Bld: 9.4 % — ABNORMAL HIGH (ref <5.7)
Mean Plasma Glucose: 223 mg/dL

## 2016-10-28 ENCOUNTER — Telehealth: Payer: Self-pay

## 2016-10-28 NOTE — Telephone Encounter (Signed)
Referral received from Stormy Fabian, Nurse Health Advisor for community resource request for patient. Left vm to discuss with pt. Will try again next week if no return call received.    Josepha Pigg, B.A.  Care Guide [phone]

## 2016-10-31 ENCOUNTER — Ambulatory Visit (INDEPENDENT_AMBULATORY_CARE_PROVIDER_SITE_OTHER): Payer: Medicare HMO | Admitting: Family Medicine

## 2016-10-31 ENCOUNTER — Encounter: Payer: Self-pay | Admitting: Family Medicine

## 2016-10-31 VITALS — BP 122/72 | HR 110 | Resp 16 | Ht 62.0 in | Wt 205.0 lb

## 2016-10-31 DIAGNOSIS — I10 Essential (primary) hypertension: Secondary | ICD-10-CM | POA: Diagnosis not present

## 2016-10-31 DIAGNOSIS — Z91199 Patient's noncompliance with other medical treatment and regimen due to unspecified reason: Secondary | ICD-10-CM

## 2016-10-31 DIAGNOSIS — Z23 Encounter for immunization: Secondary | ICD-10-CM | POA: Diagnosis not present

## 2016-10-31 DIAGNOSIS — E7849 Other hyperlipidemia: Secondary | ICD-10-CM

## 2016-10-31 DIAGNOSIS — E1169 Type 2 diabetes mellitus with other specified complication: Secondary | ICD-10-CM | POA: Diagnosis not present

## 2016-10-31 DIAGNOSIS — Z9119 Patient's noncompliance with other medical treatment and regimen: Secondary | ICD-10-CM

## 2016-10-31 DIAGNOSIS — F5101 Primary insomnia: Secondary | ICD-10-CM

## 2016-10-31 DIAGNOSIS — E784 Other hyperlipidemia: Secondary | ICD-10-CM | POA: Diagnosis not present

## 2016-10-31 DIAGNOSIS — E669 Obesity, unspecified: Secondary | ICD-10-CM

## 2016-10-31 DIAGNOSIS — IMO0001 Reserved for inherently not codable concepts without codable children: Secondary | ICD-10-CM

## 2016-10-31 DIAGNOSIS — J45991 Cough variant asthma: Secondary | ICD-10-CM | POA: Diagnosis not present

## 2016-10-31 NOTE — Patient Instructions (Addendum)
Physical exam in 12 weeks, call if you need me sooner  Improving blood sugar , congrats  Good foot exam  Flu vaccine today  Fasting lipid, cmp and EGFr and hBA1C 1 week before next visit  Goal for fasting blood sugar ranges from 80 to 120 and 2 hours after any meal or at bedtime should be between 130 to 170.   It is important that you exercise regularly at least 30 minutes 5 times a week. If you develop chest pain, have severe difficulty breathing, or feel very tired, stop exercising immediately and seek medical attention   Please work on good  health habits so that your health will improve. 1. Commitment to daily physical activity for 30 to 60  minutes, if you are able to do this.  2. Commitment to wise food choices. Aim for half of your  food intake to be vegetable and fruit, one quarter starchy foods, and one quarter protein. Try to eat on a regular schedule  3 meals per day, snacking between meals should be limited to vegetables or fruits or small portions of nuts. 64 ounces of water per day is generally recommended, unless you have specific health conditions, like heart failure or kidney failure where you will need to limit fluid intake.  3. Commitment to sufficient and a  good quality of physical and mental rest daily, generally between 6 to 8 hours per day.  WITH PERSISTANCE AND PERSEVERANCE, THE IMPOSSIBLE , BECOMES THE NORM!

## 2016-11-01 ENCOUNTER — Other Ambulatory Visit (HOSPITAL_COMMUNITY)
Admission: RE | Admit: 2016-11-01 | Discharge: 2016-11-01 | Disposition: A | Discharge status: Home / Self Care | Payer: Medicare HMO | Source: Other Acute Inpatient Hospital | Attending: Family Medicine | Admitting: Family Medicine

## 2016-11-01 DIAGNOSIS — E1169 Type 2 diabetes mellitus with other specified complication: Secondary | ICD-10-CM | POA: Insufficient documentation

## 2016-11-02 LAB — MICROALBUMIN / CREATININE URINE RATIO
CREATININE, UR: 176.6 mg/dL
MICROALB/CREAT RATIO: 5.5 mg/g{creat} (ref 0.0–30.0)
Microalb, Ur: 9.8 ug/mL — ABNORMAL HIGH

## 2016-11-04 NOTE — Assessment & Plan Note (Signed)
Sleep hygiene reviewed and written information offered also. Prescription sent for  medication needed. Controlled, no change in medication  

## 2016-11-04 NOTE — Assessment & Plan Note (Signed)
Controlled, no change in medication DASH diet and commitment to daily physical activity for a minimum of 30 minutes discussed and encouraged, as a part of hypertension management. The importance of attaining a healthy weight is also discussed.  BP/Weight 10/31/2016 10/12/2016 10/10/2016 08/03/2016 04/28/2016 01/22/2016 1/90/1222  Systolic BP 411 464 314 276 701 100 349  Diastolic BP 72 74 65 78 78 80 70  Wt. (Lbs) 205 204.08 206 206.04 208.4 212.12 209  BMI 37.49 37.33 37.68 36.5 36.92 37.58 37.02

## 2016-11-04 NOTE — Assessment & Plan Note (Signed)
Improving Tiffany Jacobs is reminded of the importance of commitment to daily physical activity for 30 minutes or more, as able and the need to limit carbohydrate intake to 30 to 60 grams per meal to help with blood sugar control.   The need to take medication as prescribed, test blood sugar as directed, and to call between visits if there is a concern that blood sugar is uncontrolled is also discussed.   Tiffany Jacobs is reminded of the importance of daily foot exam, annual eye examination, and good blood sugar, blood pressure and cholesterol control.  Diabetic Labs Latest Ref Rng & Units 11/01/2016 10/24/2016 07/22/2016 04/18/2016 01/18/2016  HbA1c <5.7 % - 9.4(H) 10.5(H) 12.0(H) 11.1(H)  Microalbumin Not Estab. ug/mL 9.8(H) - - - -  Micro/Creat Ratio 0.0 - 30.0 mg/g creat 5.5 - - - -  Chol <200 mg/dL - - 140 - 129  HDL >50 mg/dL - - 36(L) - 41(L)  Calc LDL <100 mg/dL - - 84 - 66  Triglycerides <150 mg/dL - - 99 - 109  Creatinine 0.60 - 0.93 mg/dL - 0.75 0.75 0.87 0.82   BP/Weight 10/31/2016 10/12/2016 10/10/2016 08/03/2016 04/28/2016 01/22/2016 8/46/9629  Systolic BP 528 413 244 010 272 536 644  Diastolic BP 72 74 65 78 78 80 70  Wt. (Lbs) 205 204.08 206 206.04 208.4 212.12 209  BMI 37.49 37.33 37.68 36.5 36.92 37.58 37.02   Foot/eye exam completion dates Latest Ref Rng & Units 10/31/2016 08/27/2015  Eye Exam No Retinopathy - -  Foot exam Order - - -  Foot Form Completion - Done Done

## 2016-11-04 NOTE — Assessment & Plan Note (Signed)
Insulin indicated and recommended, still refuses

## 2016-11-04 NOTE — Assessment & Plan Note (Signed)
Controlled, no change in medication  

## 2016-11-04 NOTE — Assessment & Plan Note (Signed)
Hyperlipidemia:Low fat diet discussed and encouraged.   Lipid Panel  Lab Results  Component Value Date   CHOL 140 07/22/2016   HDL 36 (L) 07/22/2016   LDLCALC 84 07/22/2016   TRIG 99 07/22/2016   CHOLHDL 3.9 07/22/2016    Updated lab needed at/ before next visit. Need to commit to regular exercise

## 2016-11-04 NOTE — Progress Notes (Signed)
Tiffany Jacobs     MRN: [id number]      DOB: Oct 02, 1945   HPI Tiffany Jacobs is here for follow up and re-evaluation of chronic medical conditions, medication management and review of any available recent lab and radiology data.  Preventive health is updated, specifically  Cancer screening and Immunization.   Questions or concerns regarding consultations or procedures which the PT has had in the interim are  addressed. The PT denies any adverse reactions to current medications since the last visit.  There are no new concerns.  There are no specific complaints  Denies polyuria, polydipsia, blurred vision , or hypoglycemic episodes.   ROS Denies recent fever or chills. Denies sinus pressure, nasal congestion, ear pain or sore throat. Denies chest congestion, productive cough or wheezing. Denies chest pains, palpitations and leg swelling Denies abdominal pain, nausea, vomiting,diarrhea or constipation.   Denies dysuria, frequency, hesitancy or incontinence. Denies joint pain, swelling and limitation in mobility. Denies headaches, seizures, numbness, or tingling. Denies depression, anxiety or insomnia. Denies skin break down or rash.   PE  BP 122/72   Pulse (!) 110   Resp 16   Ht 5\' 2"  (1.575 m)   Wt 205 lb (93 kg)   SpO2 95%   BMI 37.49 kg/m   Patient alert and oriented and in no cardiopulmonary distress.  HEENT: No facial asymmetry, EOMI,   oropharynx pink and moist.  Neck supple no JVD, no mass.  Chest: Clear to auscultation bilaterally.  CVS: S1, S2 no murmurs, no S3.Regular rate.  ABD: Soft non tender.   Ext: No edema  MS: Adequate ROM spine, shoulders, hips and knees.  Skin: Intact, no ulcerations or rash noted.  Psych: Good eye contact, normal affect. Memory intact not anxious or depressed appearing.  CNS: CN 2-12 intact, power,  normal throughout.no focal deficits noted.   Assessment & Plan  Essential hypertension Controlled, no change in  medication DASH diet and commitment to daily physical activity for a minimum of 30 minutes discussed and encouraged, as a part of hypertension management. The importance of attaining a healthy weight is also discussed.  BP/Weight 10/31/2016 10/12/2016 10/10/2016 08/03/2016 04/28/2016 01/22/2016 7/56/4332  Systolic BP 951 884 166 063 016 010 932  Diastolic BP 72 74 65 78 78 80 70  Wt. (Lbs) 205 204.08 206 206.04 208.4 212.12 209  BMI 37.49 37.33 37.68 36.5 36.92 37.58 37.02       COUGH VARIANT ASTHMA Controlled, no change in medication   Diabetes mellitus type 2 in obese [facility]) Improving Tiffany Jacobs is reminded of the importance of commitment to daily physical activity for 30 minutes or more, as able and the need to limit carbohydrate intake to 30 to 60 grams per meal to help with blood sugar control.   The need to take medication as prescribed, test blood sugar as directed, and to call between visits if there is a concern that blood sugar is uncontrolled is also discussed.   Tiffany Jacobs is reminded of the importance of daily foot exam, annual eye examination, and good blood sugar, blood pressure and cholesterol control.  Diabetic Labs Latest Ref Rng & Units 11/01/2016 10/24/2016 07/22/2016 04/18/2016 01/18/2016  HbA1c <5.7 % - 9.4(H) 10.5(H) 12.0(H) 11.1(H)  Microalbumin Not Estab. ug/mL 9.8(H) - - - -  Micro/Creat Ratio 0.0 - 30.0 mg/g creat 5.5 - - - -  Chol <200 mg/dL - - 140 - 129  HDL >50 mg/dL - - 36(L) - 41(L)  Calc  LDL <100 mg/dL - - 84 - 66  Triglycerides <150 mg/dL - - 99 - 109  Creatinine 0.60 - 0.93 mg/dL - 0.75 0.75 0.87 0.82   BP/Weight 10/31/2016 10/12/2016 10/10/2016 08/03/2016 04/28/2016 01/22/2016 6/37/8588  Systolic BP 502 774 128 786 767 209 470  Diastolic BP 72 74 65 78 78 80 70  Wt. (Lbs) 205 204.08 206 206.04 208.4 212.12 209  BMI 37.49 37.33 37.68 36.5 36.92 37.58 37.02   Foot/eye exam completion dates Latest Ref Rng & Units 10/31/2016 08/27/2015  Eye Exam No  Retinopathy - -  Foot exam Order - - -  Foot Form Completion - Done Done        Hyperlipidemia Hyperlipidemia:Low fat diet discussed and encouraged.   Lipid Panel  Lab Results  Component Value Date   CHOL 140 07/22/2016   HDL 36 (L) 07/22/2016   LDLCALC 84 07/22/2016   TRIG 99 07/22/2016   CHOLHDL 3.9 07/22/2016    Updated lab needed at/ before next visit. Need to commit to regular exercise   Insomnia Sleep hygiene reviewed and written information offered also. Prescription sent for  medication needed. Controlled, no change in medication   Obesity, Class II, BMI 35.0-39.9, with comorbidity (see actual BMI) Deteriorated. Patient re-educated about  the importance of commitment to a  minimum of 150 minutes of exercise per week.  The importance of healthy food choices with portion control discussed. Encouraged to start a food diary, count calories and to consider  joining a support group. Sample diet sheets offered. Goals set by the patient for the next several months.   Weight /BMI 10/31/2016 10/12/2016 10/10/2016  WEIGHT 205 lb 204 lb 1.3 oz 206 lb  HEIGHT 5\' 2"  5\' 2"  5\' 2"   BMI 37.49 kg/m2 37.33 kg/m2 37.68 kg/m2      Personal history of noncompliance with medical treatment, presenting hazards to health Insulin indicated and recommended, still refuses

## 2016-11-04 NOTE — Assessment & Plan Note (Signed)
Deteriorated. Patient re-educated about  the importance of commitment to a  minimum of 150 minutes of exercise per week.  The importance of healthy food choices with portion control discussed. Encouraged to start a food diary, count calories and to consider  joining a support group. Sample diet sheets offered. Goals set by the patient for the next several months.   Weight /BMI 10/31/2016 10/12/2016 10/10/2016  WEIGHT 205 lb 204 lb 1.3 oz 206 lb  HEIGHT 5\' 2"  5\' 2"  5\' 2"   BMI 37.49 kg/m2 37.33 kg/m2 37.68 kg/m2

## 2016-11-16 ENCOUNTER — Other Ambulatory Visit: Payer: Self-pay | Admitting: Family Medicine

## 2016-11-16 DIAGNOSIS — E118 Type 2 diabetes mellitus with unspecified complications: Principal | ICD-10-CM

## 2016-11-16 DIAGNOSIS — E1165 Type 2 diabetes mellitus with hyperglycemia: Secondary | ICD-10-CM

## 2016-11-16 DIAGNOSIS — IMO0002 Reserved for concepts with insufficient information to code with codable children: Secondary | ICD-10-CM

## 2016-12-12 ENCOUNTER — Other Ambulatory Visit: Payer: Self-pay | Admitting: Family Medicine

## 2016-12-12 DIAGNOSIS — J45991 Cough variant asthma: Secondary | ICD-10-CM

## 2016-12-12 DIAGNOSIS — I10 Essential (primary) hypertension: Secondary | ICD-10-CM

## 2016-12-13 NOTE — Telephone Encounter (Signed)
Seen [phone] 18 

## 2016-12-16 ENCOUNTER — Other Ambulatory Visit: Payer: Self-pay | Admitting: Family Medicine

## 2016-12-19 NOTE — Telephone Encounter (Signed)
Seen [phone] 18 

## 2017-01-09 ENCOUNTER — Other Ambulatory Visit: Payer: Self-pay | Admitting: Family Medicine

## 2017-01-09 DIAGNOSIS — E1165 Type 2 diabetes mellitus with hyperglycemia: Secondary | ICD-10-CM

## 2017-01-09 DIAGNOSIS — E118 Type 2 diabetes mellitus with unspecified complications: Principal | ICD-10-CM

## 2017-01-09 DIAGNOSIS — IMO0002 Reserved for concepts with insufficient information to code with codable children: Secondary | ICD-10-CM

## 2017-01-12 ENCOUNTER — Other Ambulatory Visit: Payer: Self-pay

## 2017-01-12 ENCOUNTER — Telehealth: Payer: Self-pay | Admitting: *Deleted

## 2017-01-12 MED ORDER — ZOLPIDEM TARTRATE 10 MG PO TABS: 10.0000 mg | ORAL_TABLET | Freq: Every evening | ORAL | 0 refills | Status: DC | PRN

## 2017-01-12 NOTE — Telephone Encounter (Signed)
Patient called left message stating she needs her sleeping pills refilled. Please advise

## 2017-01-12 NOTE — Telephone Encounter (Signed)
Med refilled.

## 2017-01-23 ENCOUNTER — Other Ambulatory Visit: Payer: Self-pay

## 2017-01-23 ENCOUNTER — Telehealth: Payer: Self-pay | Admitting: Family Medicine

## 2017-01-23 DIAGNOSIS — M5010 Cervical disc disorder with radiculopathy, unspecified cervical region: Secondary | ICD-10-CM

## 2017-01-23 MED ORDER — GABAPENTIN 300 MG PO CAPS: 300.0000 mg | ORAL_CAPSULE | Freq: Every day | ORAL | 1 refills | Status: DC

## 2017-01-23 NOTE — Telephone Encounter (Signed)
Patient left message  requesting gabapentin refill  Cb#: [phone]

## 2017-01-23 NOTE — Telephone Encounter (Signed)
Refill sent in

## 2017-01-31 ENCOUNTER — Encounter: Payer: Self-pay | Admitting: Family Medicine

## 2017-01-31 ENCOUNTER — Ambulatory Visit: Payer: Medicare HMO | Admitting: Family Medicine

## 2017-01-31 VITALS — BP 142/88 | HR 105 | Resp 16 | Ht 62.0 in | Wt 205.0 lb

## 2017-01-31 DIAGNOSIS — E1169 Type 2 diabetes mellitus with other specified complication: Secondary | ICD-10-CM

## 2017-01-31 DIAGNOSIS — E7849 Other hyperlipidemia: Secondary | ICD-10-CM

## 2017-01-31 DIAGNOSIS — Z23 Encounter for immunization: Secondary | ICD-10-CM

## 2017-01-31 DIAGNOSIS — I10 Essential (primary) hypertension: Secondary | ICD-10-CM

## 2017-01-31 DIAGNOSIS — E1165 Type 2 diabetes mellitus with hyperglycemia: Secondary | ICD-10-CM

## 2017-01-31 DIAGNOSIS — E669 Obesity, unspecified: Secondary | ICD-10-CM

## 2017-01-31 DIAGNOSIS — Z9119 Patient's noncompliance with other medical treatment and regimen: Secondary | ICD-10-CM | POA: Diagnosis not present

## 2017-01-31 DIAGNOSIS — Z91199 Patient's noncompliance with other medical treatment and regimen due to unspecified reason: Secondary | ICD-10-CM

## 2017-01-31 DIAGNOSIS — J45991 Cough variant asthma: Secondary | ICD-10-CM

## 2017-01-31 DIAGNOSIS — F5101 Primary insomnia: Secondary | ICD-10-CM | POA: Diagnosis not present

## 2017-01-31 MED ORDER — ZOLPIDEM TARTRATE 10 MG PO TABS: 10.0000 mg | ORAL_TABLET | Freq: Every evening | ORAL | 5 refills | Status: DC | PRN

## 2017-01-31 NOTE — Patient Instructions (Addendum)
Annual physical exam in 3 month, call  If you need me before  HBA1C, TSH and chem 7 and EGFR non fasting today please.  No medication changes  Please work on weight loss and good blood pressure and blood sugar control  Thankful you feel well and are working on your health  It is important that you exercise regularly at least 30 minutes 5 times a week. If you develop chest pain, have severe difficulty breathing, or feel very tired, stop exercising immediately and seek medical attention  Please work on good  health habits so that your health will improve. 1. Commitment to daily physical activity for 30 to 60  minutes, if you are able to do this.  2. Commitment to wise food choices. Aim for half of your  food intake to be vegetable and fruit, one quarter starchy foods, and one quarter protein. Try to eat on a regular schedule  3 meals per day, snacking between meals should be limited to vegetables or fruits or small portions of nuts. 64 ounces of water per day is generally recommended, unless you have specific health conditions, like heart failure or kidney failure where you will need to limit fluid intake.  3. Commitment to sufficient and a  good quality of physical and mental rest daily, generally between 6 to 8 hours per day.  WITH PERSISTANCE AND PERSEVERANCE, THE IMPOSSIBLE , BECOMES THE NORM!

## 2017-02-01 ENCOUNTER — Other Ambulatory Visit: Payer: Self-pay | Admitting: Family Medicine

## 2017-02-01 ENCOUNTER — Encounter: Payer: Self-pay | Admitting: Family Medicine

## 2017-02-01 LAB — BASIC METABOLIC PANEL WITH GFR
BUN: 12 mg/dL (ref 7–25)
CALCIUM: 9.6 mg/dL (ref 8.6–10.4)
CHLORIDE: 101 mmol/L (ref 98–110)
CO2: 28 mmol/L (ref 20–32)
CREATININE: 0.74 mg/dL (ref 0.60–0.93)
GFR, Est African American: 94 mL/min/{1.73_m2} (ref >=60)
GFR, Est Non African American: 82 mL/min/{1.73_m2} (ref >=60)
Glucose, Bld: 266 mg/dL — ABNORMAL HIGH (ref 65–139)
Potassium: 4.2 mmol/L (ref 3.5–5.3)
Sodium: 138 mmol/L (ref 135–146)

## 2017-02-01 LAB — HEMOGLOBIN A1C
EAG (MMOL/L): 11.9 (calc)
Hgb A1c MFr Bld: 9.1 % of total Hgb — ABNORMAL HIGH (ref <5.7)
Mean Plasma Glucose: 214 (calc)

## 2017-02-01 LAB — HM DIABETES EYE EXAM

## 2017-02-01 LAB — TSH: TSH: 1.71 m[IU]/L (ref 0.40–4.50)

## 2017-02-03 ENCOUNTER — Telehealth: Payer: Self-pay | Admitting: *Deleted

## 2017-02-03 NOTE — Telephone Encounter (Signed)
Patient called requesting a handicap paper to be filled out, per pt Dr Moshe Cipro filled her one out last time.

## 2017-02-06 NOTE — Telephone Encounter (Signed)
Completed - waiting on md to sign

## 2017-02-10 ENCOUNTER — Other Ambulatory Visit: Payer: Self-pay

## 2017-02-10 MED ORDER — INSULIN ISOPHANE & REGULAR (HUMAN 70-30)100 UNIT/ML KWIKPEN: PEN_INJECTOR | SUBCUTANEOUS | 11 refills | Status: DC

## 2017-02-11 ENCOUNTER — Encounter: Payer: Self-pay | Admitting: Family Medicine

## 2017-02-11 NOTE — Assessment & Plan Note (Signed)
Sleep hygiene reviewed and written information offered also. Prescription sent for  medication needed.  

## 2017-02-11 NOTE — Progress Notes (Signed)
Tiffany Jacobs     MRN: [id number]      DOB: Sep 01, 1945   HPI Ms. Spies is here for follow up and re-evaluation of chronic medical conditions, medication management and review of any available recent lab and radiology data.  Preventive health is updated, specifically  Cancer screening and Immunization.   Questions or concerns regarding consultations or procedures which the PT has had in the interim are  addressed. The PT denies any adverse reactions to current medications since the last visit.  There are no new concerns.  There are no specific complaints  Denies polyuria, polydipsia, blurred vision , or hypoglycemic episodes. Blood sugar varies with diet, and her exercise is inconsistent and inadequate per her report States she feels well overall and that she is doing better  ROS Denies recent fever or chills. Denies sinus pressure, nasal congestion, ear pain or sore throat. Denies chest congestion, productive cough or wheezing. Denies chest pains, palpitations and leg swelling Denies abdominal pain, nausea, vomiting,diarrhea or constipation.   Denies dysuria, frequency, hesitancy or incontinence. Denies uncontrolled  joint pain, swelling and limitation in mobility. Denies headaches, seizures, numbness, or tingling. Denies depression, anxiety or insomnia. Denies skin break down or rash.   PE  BP (!) 142/88   Pulse (!) 105   Resp 16   Ht 5\' 2"  (1.575 m)   Wt 205 lb (93 kg)   SpO2 96%   BMI 37.49 kg/m   Patient alert and oriented and in no cardiopulmonary distress.  HEENT: No facial asymmetry, EOMI,   oropharynx pink and moist.  Neck supple no JVD, no mass.  Chest: Clear to auscultation bilaterally.  CVS: S1, S2 no murmurs, no S3.Regular rate.  ABD: Soft non tender.   Ext: No edema  MS: Adequate though reduced  ROM spine, shoulders, hips and knees.  Skin: Intact, no ulcerations or rash noted.  Psych: Good eye contact, normal affect. Memory intact not  anxious or depressed appearing.  CNS: CN 2-12 intact, power,  normal throughout.no focal deficits noted.   Assessment & Plan  Essential hypertension Elevated at visit, out of one of her  Meds, which she will fill today DASH diet and commitment to daily physical activity for a minimum of 30 minutes discussed and encouraged, as a part of hypertension management. The importance of attaining a healthy weight is also discussed.  BP/Weight 01/31/2017 10/31/2016 10/12/2016 10/10/2016 08/03/2016 04/28/2016 01/01/2535  Systolic BP 644 034 742 595 638 756 433  Diastolic BP 88 72 74 65 78 78 80  Wt. (Lbs) 205 205 204.08 206 206.04 208.4 212.12  BMI 37.49 37.49 37.33 37.68 36.5 36.92 37.58       Personal history of noncompliance with medical treatment, presenting hazards to health Ongoing problem, refuses to fill insulin which she needs, refuses to see endocrinologist and her blood sugar remains uncontrolled   Uncontrolled diabetes mellitus ([place]) Ms. Dols is reminded of the importance of commitment to daily physical activity for 30 minutes or more, as able and the need to limit carbohydrate intake to 30 to 60 grams per meal to help with blood sugar control.   The need to take medication as prescribed, test blood sugar as directed, and to call between visits if there is a concern that blood sugar is uncontrolled is also discussed.   Ms. Geeslin is reminded of the importance of daily foot exam, annual eye examination, and good blood sugar, blood pressure and cholesterol control. Remains uncontrolled, NEEDS insulin, again I  am reaching out to her with this message, also  NEEDS to be diligent wih treatment paln of carbohydrate control and exercise plan  Diabetic Labs Latest Ref Rng & Units 01/31/2017 11/01/2016 10/24/2016 07/22/2016 04/18/2016  HbA1c <5.7 % of total Hgb 9.1(H) - 9.4(H) 10.5(H) 12.0(H)  Microalbumin Not Estab. ug/mL - 9.8(H) - - -  Micro/Creat Ratio 0.0 - 30.0 mg/g creat - 5.5 - - -    Chol <200 mg/dL - - - 140 -  HDL >50 mg/dL - - - 36(L) -  Calc LDL <100 mg/dL - - - 84 -  Triglycerides <150 mg/dL - - - 99 -  Creatinine 0.60 - 0.93 mg/dL 0.74 - 0.75 0.75 0.87   BP/Weight 01/31/2017 10/31/2016 10/12/2016 10/10/2016 08/03/2016 04/28/2016 97/67/3419  Systolic BP 379 024 097 353 299 242 683  Diastolic BP 88 72 74 65 78 78 80  Wt. (Lbs) 205 205 204.08 206 206.04 208.4 212.12  BMI 37.49 37.49 37.33 37.68 36.5 36.92 37.58   Foot/eye exam completion dates Latest Ref Rng & Units 02/01/2017 10/31/2016  Eye Exam No Retinopathy Retinopathy(A) -  Foot exam Order - - -  Foot Form Completion - - Done        Insomnia Sleep hygiene reviewed and written information offered also. Prescription sent for  medication needed.   COUGH VARIANT ASTHMA Stable and controlled, no recent flares  Obesity, Class II, BMI 35.0-39.9, with comorbidity (see actual BMI) Unchanged Patient re-educated about  the importance of commitment to a  minimum of 150 minutes of exercise per week.  The importance of healthy food choices with portion control discussed. Encouraged to start a food diary, count calories and to consider  joining a support group. Sample diet sheets offered. Goals set by the patient for the next several months.   Weight /BMI 01/31/2017 10/31/2016 10/12/2016  WEIGHT 205 lb 205 lb 204 lb 1.3 oz  HEIGHT 5\' 2"  5\' 2"  5\' 2"   BMI 37.49 kg/m2 37.49 kg/m2 37.33 kg/m2

## 2017-02-11 NOTE — Assessment & Plan Note (Signed)
Elevated at visit, out of one of her  Meds, which she will fill today DASH diet and commitment to daily physical activity for a minimum of 30 minutes discussed and encouraged, as a part of hypertension management. The importance of attaining a healthy weight is also discussed.  BP/Weight 01/31/2017 10/31/2016 10/12/2016 10/10/2016 08/03/2016 04/28/2016 85/92/7639  Systolic BP 432 003 794 446 190 122 241  Diastolic BP 88 72 74 65 78 78 80  Wt. (Lbs) 205 205 204.08 206 206.04 208.4 212.12  BMI 37.49 37.49 37.33 37.68 36.5 36.92 37.58

## 2017-02-11 NOTE — Assessment & Plan Note (Signed)
Stable and controlled, no recent flares

## 2017-02-11 NOTE — Assessment & Plan Note (Addendum)
Unchanged Patient re-educated about  the importance of commitment to a  minimum of 150 minutes of exercise per week.  The importance of healthy food choices with portion control discussed. Encouraged to start a food diary, count calories and to consider  joining a support group. Sample diet sheets offered. Goals set by the patient for the next several months.   Weight /BMI 01/31/2017 10/31/2016 10/12/2016  WEIGHT 205 lb 205 lb 204 lb 1.3 oz  HEIGHT 5\' 2"  5\' 2"  5\' 2"   BMI 37.49 kg/m2 37.49 kg/m2 37.33 kg/m2

## 2017-02-11 NOTE — Assessment & Plan Note (Signed)
Tiffany Jacobs is reminded of the importance of commitment to daily physical activity for 30 minutes or more, as able and the need to limit carbohydrate intake to 30 to 60 grams per meal to help with blood sugar control.   The need to take medication as prescribed, test blood sugar as directed, and to call between visits if there is a concern that blood sugar is uncontrolled is also discussed.   Tiffany Jacobs is reminded of the importance of daily foot exam, annual eye examination, and good blood sugar, blood pressure and cholesterol control. Remains uncontrolled, NEEDS insulin, again I am reaching out to her with this message, also  NEEDS to be diligent wih treatment paln of carbohydrate control and exercise plan  Diabetic Labs Latest Ref Rng & Units 01/31/2017 11/01/2016 10/24/2016 07/22/2016 04/18/2016  HbA1c <5.7 % of total Hgb 9.1(H) - 9.4(H) 10.5(H) 12.0(H)  Microalbumin Not Estab. ug/mL - 9.8(H) - - -  Micro/Creat Ratio 0.0 - 30.0 mg/g creat - 5.5 - - -  Chol <200 mg/dL - - - 140 -  HDL >50 mg/dL - - - 36(L) -  Calc LDL <100 mg/dL - - - 84 -  Triglycerides <150 mg/dL - - - 99 -  Creatinine 0.60 - 0.93 mg/dL 0.74 - 0.75 0.75 0.87   BP/Weight 01/31/2017 10/31/2016 10/12/2016 10/10/2016 08/03/2016 04/28/2016 25/36/6440  Systolic BP 347 425 956 387 564 332 951  Diastolic BP 88 72 74 65 78 78 80  Wt. (Lbs) 205 205 204.08 206 206.04 208.4 212.12  BMI 37.49 37.49 37.33 37.68 36.5 36.92 37.58   Foot/eye exam completion dates Latest Ref Rng & Units 02/01/2017 10/31/2016  Eye Exam No Retinopathy Retinopathy(A) -  Foot exam Order - - -  Foot Form Completion - - Done

## 2017-02-11 NOTE — Assessment & Plan Note (Signed)
Ongoing problem, refuses to fill insulin which she needs, refuses to see endocrinologist and her blood sugar remains uncontrolled

## 2017-03-24 ENCOUNTER — Telehealth: Payer: Self-pay | Admitting: Family Medicine

## 2017-03-24 NOTE — Telephone Encounter (Signed)
Patient is requesting a refill of zolpidem She uses [facility] Cb#: [phone]

## 2017-03-28 ENCOUNTER — Other Ambulatory Visit: Payer: Self-pay

## 2017-03-28 MED ORDER — ZOLPIDEM TARTRATE 10 MG PO TABS: 10.0000 mg | ORAL_TABLET | Freq: Every evening | ORAL | 0 refills | Status: DC | PRN

## 2017-03-28 NOTE — Telephone Encounter (Signed)
[facility] 

## 2017-04-24 ENCOUNTER — Telehealth: Payer: Self-pay | Admitting: Family Medicine

## 2017-04-24 NOTE — Telephone Encounter (Signed)
[facility] will not refill her zolpidem, she needs something else, please advise.

## 2017-04-24 NOTE — Telephone Encounter (Signed)
Patient left a message on voicemail:  "insurance will no longer fill her sleeping medication, she is requesting to speak to a nurse"   I called her back to get more information, no answer, so I left voicemail. Cb#: [phone]

## 2017-04-24 NOTE — Telephone Encounter (Signed)
Dr Moshe Cipro is not available this week. The covering providers will not prescribe any controlled substances. If insurance is not covering the zolpidem, she will have to wait until the dr is back in the office next week to address. Please have her call back Monday am so a message can be sent to the dr

## 2017-04-24 NOTE — Telephone Encounter (Signed)
See previous message

## 2017-05-01 NOTE — Telephone Encounter (Signed)
Left msg to call the office if she was still having a problem

## 2017-05-03 ENCOUNTER — Telehealth: Payer: Self-pay | Admitting: Family Medicine

## 2017-05-03 NOTE — Telephone Encounter (Signed)
Patient left message on nurse line asking for return call regarding sleeping medication. That was all she said

## 2017-05-04 ENCOUNTER — Other Ambulatory Visit: Payer: Self-pay

## 2017-05-04 MED ORDER — ZOLPIDEM TARTRATE 10 MG PO TABS: 10.0000 mg | ORAL_TABLET | Freq: Every evening | ORAL | 0 refills | Status: DC | PRN

## 2017-05-04 NOTE — Telephone Encounter (Signed)
Refill sent again

## 2017-05-09 ENCOUNTER — Ambulatory Visit: Payer: Medicare HMO | Admitting: Family Medicine

## 2017-05-29 ENCOUNTER — Other Ambulatory Visit: Payer: Self-pay | Admitting: Family Medicine

## 2017-05-30 ENCOUNTER — Other Ambulatory Visit: Payer: Self-pay

## 2017-05-30 DIAGNOSIS — I10 Essential (primary) hypertension: Secondary | ICD-10-CM

## 2017-05-30 MED ORDER — SPIRONOLACTONE 100 MG PO TABS: 100.0000 mg | ORAL_TABLET | Freq: Every day | ORAL | 1 refills | Status: DC

## 2017-05-30 MED ORDER — AMLODIPINE BESYLATE 10 MG PO TABS: 10.0000 mg | ORAL_TABLET | Freq: Every day | ORAL | 1 refills | Status: DC

## 2017-06-01 ENCOUNTER — Other Ambulatory Visit: Payer: Self-pay | Admitting: Family Medicine

## 2017-06-01 DIAGNOSIS — Z1239 Encounter for other screening for malignant neoplasm of breast: Secondary | ICD-10-CM

## 2017-06-13 ENCOUNTER — Other Ambulatory Visit: Payer: Self-pay

## 2017-06-13 MED ORDER — TRAZODONE HCL 100 MG PO TABS: 100.0000 mg | ORAL_TABLET | Freq: Every day | ORAL | 1 refills | Status: DC

## 2017-06-14 ENCOUNTER — Telehealth: Payer: Self-pay | Admitting: Family Medicine

## 2017-06-14 DIAGNOSIS — M5010 Cervical disc disorder with radiculopathy, unspecified cervical region: Secondary | ICD-10-CM

## 2017-06-14 MED ORDER — GABAPENTIN 300 MG PO CAPS: 300.0000 mg | ORAL_CAPSULE | Freq: Every day | ORAL | 1 refills | Status: DC

## 2017-06-14 MED ORDER — LANCETS MISC: 1.0000 | 0 refills | Status: DC

## 2017-06-14 NOTE — Telephone Encounter (Signed)
Done

## 2017-06-14 NOTE — Telephone Encounter (Signed)
Pt needs gabapentin (NEURONTIN) 300 MG capsule, needle to check the sugar,  ( send in to [facility])  and the sleeping medicine, per the notes ins will not pay, may need to wait for Dr Moshe Cipro to change it.

## 2017-07-12 ENCOUNTER — Encounter: Payer: Self-pay | Admitting: Family Medicine

## 2017-07-12 ENCOUNTER — Ambulatory Visit (INDEPENDENT_AMBULATORY_CARE_PROVIDER_SITE_OTHER): Payer: Medicare HMO | Admitting: Family Medicine

## 2017-07-12 ENCOUNTER — Telehealth: Payer: Self-pay

## 2017-07-12 VITALS — BP 128/78 | HR 93 | Resp 16 | Ht 62.0 in | Wt 207.0 lb

## 2017-07-12 DIAGNOSIS — E669 Obesity, unspecified: Secondary | ICD-10-CM

## 2017-07-12 DIAGNOSIS — E785 Hyperlipidemia, unspecified: Secondary | ICD-10-CM

## 2017-07-12 DIAGNOSIS — Z0000 Encounter for general adult medical examination without abnormal findings: Secondary | ICD-10-CM | POA: Diagnosis not present

## 2017-07-12 DIAGNOSIS — I10 Essential (primary) hypertension: Secondary | ICD-10-CM

## 2017-07-12 DIAGNOSIS — J45991 Cough variant asthma: Secondary | ICD-10-CM

## 2017-07-12 DIAGNOSIS — E1169 Type 2 diabetes mellitus with other specified complication: Secondary | ICD-10-CM

## 2017-07-12 DIAGNOSIS — M5010 Cervical disc disorder with radiculopathy, unspecified cervical region: Secondary | ICD-10-CM

## 2017-07-12 DIAGNOSIS — Z1231 Encounter for screening mammogram for malignant neoplasm of breast: Secondary | ICD-10-CM

## 2017-07-12 MED ORDER — GABAPENTIN 300 MG PO CAPS: 300.0000 mg | ORAL_CAPSULE | Freq: Every day | ORAL | 1 refills | Status: DC

## 2017-07-12 MED ORDER — MONTELUKAST SODIUM 10 MG PO TABS: 10.0000 mg | ORAL_TABLET | Freq: Every day | ORAL | 1 refills | Status: DC

## 2017-07-12 MED ORDER — TRAZODONE HCL 100 MG PO TABS: 100.0000 mg | ORAL_TABLET | Freq: Every day | ORAL | 1 refills | Status: DC

## 2017-07-12 MED ORDER — PRAVASTATIN SODIUM 20 MG PO TABS: 20.0000 mg | ORAL_TABLET | Freq: Every day | ORAL | 1 refills | Status: DC

## 2017-07-12 MED ORDER — AMLODIPINE BESYLATE 10 MG PO TABS: 10.0000 mg | ORAL_TABLET | Freq: Every day | ORAL | 1 refills | Status: DC

## 2017-07-12 MED ORDER — BLOOD GLUCOSE METER KIT: PACK | 0 refills | Status: DC

## 2017-07-12 MED ORDER — SPIRONOLACTONE 100 MG PO TABS: 100.0000 mg | ORAL_TABLET | Freq: Every day | ORAL | 1 refills | Status: DC

## 2017-07-12 NOTE — Telephone Encounter (Signed)
Labs ordered.

## 2017-07-12 NOTE — Patient Instructions (Addendum)
Wellness with nurse end August  MD f/U in 5.5 months  Please schedule mammogram July 29 or after at [facility] next Thursday fasting, lipid, cmp and eGFR, hBA1c, and CBC  You are referrd to [facility] , very important that you go  It is important that you exercise regularly at least 30 minutes 5 times a week. If you develop chest pain, have severe difficulty breathing, or feel very tired, stop exercising immediately and seek medical attention Lujean Rave you  for choosing [facility]. We consider it a privelige to serve you.  Delivering excellent health care in a caring and  compassionate way is our goal.  Partnering with you,  so that together we can achieve this goal is our strategy.

## 2017-07-14 ENCOUNTER — Encounter: Payer: Self-pay | Admitting: Family Medicine

## 2017-07-14 NOTE — Assessment & Plan Note (Signed)
Annual exam as documented. Counseling done  re healthy lifestyle involving commitment to 150 minutes exercise per week, heart healthy diet, and attaining healthy weight.The importance of adequate sleep also discussed.  Immunization and cancer screening needs are specifically addressed at this visit.  

## 2017-07-14 NOTE — Progress Notes (Signed)
    Tiffany Jacobs     MRN: [id number]      DOB: 11/19/1945  HPI: Patient is in for annual physical exam.  Recent labs,  are reviewed. Immunization is reviewed , and  updated if needed.   PE: BP 128/78   Pulse 93   Resp 16   Ht 5\' 2"  (1.575 m)   Wt 207 lb (93.9 kg)   SpO2 95%   BMI 37.86 kg/m   Pleasant  female, alert and oriented x 3, in no cardio-pulmonary distress. Afebrile. HEENT No facial trauma or asymetry. Sinuses non tender.  Extra occullar muscles intact, pupils equally reactive to light. External ears normal, tympanic membranes clear. Oropharynx moist, no exudate.Poor dentition Neck: supple, no adenopathy,JVD or thyromegaly.No bruits.  Chest: Clear to ascultation bilaterally.No crackles or wheezes. Non tender to palpation  Breast: No asymetry,no masses or lumps. No tenderness. No nipple discharge or inversion. No axillary or supraclavicular adenopathy  Cardiovascular system; Heart sounds normal,  S1 and  S2 ,no S3.  No murmur, or thrill. Apical beat not displaced Peripheral pulses normal.  Abdomen: Soft, non tender, no organomegaly or masses. No bruits. Bowel sounds normal. No guarding, tenderness or rebound.  Rectal:  Deferred ptto submit stool cards  GU: Asymptomatic, not examined   Musculoskeletal exam: Decreased ROM of spine, hips , shoulders and knees. No deformity ,swelling or crepitus noted. No muscle wasting or atrophy.   Neurologic: Cranial nerves 2 to 12 intact. Power, tone ,sensation and reflexes normal throughout. No disturbance in gait. No tremor.  Skin: Intact, no ulceration, erythema , scaling or rash noted. Pigmentation normal throughout  Psych; Normal mood and affect. Judgement and concentration normal   Assessment & Plan:  Annual physical exam Annual exam as documented. Counseling done  re healthy lifestyle involving commitment to 150 minutes exercise per week, heart healthy diet, and attaining healthy  weight.The importance of adequate sleep also discussed. Immunization and cancer screening needs are specifically addressed at this visit.

## 2017-07-18 DIAGNOSIS — E1169 Type 2 diabetes mellitus with other specified complication: Secondary | ICD-10-CM

## 2017-07-18 DIAGNOSIS — E669 Obesity, unspecified: Principal | ICD-10-CM

## 2017-08-11 ENCOUNTER — Encounter: Payer: Self-pay | Admitting: Family Medicine

## 2017-08-11 DIAGNOSIS — R079 Chest pain, unspecified: Secondary | ICD-10-CM | POA: Diagnosis not present

## 2017-08-11 DIAGNOSIS — M549 Dorsalgia, unspecified: Secondary | ICD-10-CM | POA: Diagnosis not present

## 2017-08-11 DIAGNOSIS — Z7984 Long term (current) use of oral hypoglycemic drugs: Secondary | ICD-10-CM | POA: Diagnosis not present

## 2017-08-11 DIAGNOSIS — R0789 Other chest pain: Secondary | ICD-10-CM | POA: Diagnosis not present

## 2017-08-11 DIAGNOSIS — R05 Cough: Secondary | ICD-10-CM | POA: Diagnosis not present

## 2017-08-11 DIAGNOSIS — M79642 Pain in left hand: Secondary | ICD-10-CM | POA: Diagnosis not present

## 2017-08-11 DIAGNOSIS — M25512 Pain in left shoulder: Secondary | ICD-10-CM | POA: Diagnosis not present

## 2017-08-11 DIAGNOSIS — J45909 Unspecified asthma, uncomplicated: Secondary | ICD-10-CM | POA: Diagnosis not present

## 2017-08-11 DIAGNOSIS — E119 Type 2 diabetes mellitus without complications: Secondary | ICD-10-CM | POA: Diagnosis not present

## 2017-08-11 DIAGNOSIS — I10 Essential (primary) hypertension: Secondary | ICD-10-CM | POA: Diagnosis not present

## 2017-08-21 ENCOUNTER — Ambulatory Visit (INDEPENDENT_AMBULATORY_CARE_PROVIDER_SITE_OTHER): Payer: Medicare HMO | Admitting: Family Medicine

## 2017-08-21 ENCOUNTER — Encounter: Payer: Self-pay | Admitting: Family Medicine

## 2017-08-21 VITALS — BP 130/90 | HR 96 | Resp 16 | Ht 62.0 in | Wt 208.4 lb

## 2017-08-21 DIAGNOSIS — Z1231 Encounter for screening mammogram for malignant neoplasm of breast: Secondary | ICD-10-CM | POA: Diagnosis not present

## 2017-08-21 DIAGNOSIS — E669 Obesity, unspecified: Secondary | ICD-10-CM

## 2017-08-21 DIAGNOSIS — K219 Gastro-esophageal reflux disease without esophagitis: Secondary | ICD-10-CM | POA: Diagnosis not present

## 2017-08-21 DIAGNOSIS — E7849 Other hyperlipidemia: Secondary | ICD-10-CM

## 2017-08-21 DIAGNOSIS — I10 Essential (primary) hypertension: Secondary | ICD-10-CM | POA: Diagnosis not present

## 2017-08-21 DIAGNOSIS — E1169 Type 2 diabetes mellitus with other specified complication: Secondary | ICD-10-CM | POA: Diagnosis not present

## 2017-08-21 DIAGNOSIS — R079 Chest pain, unspecified: Secondary | ICD-10-CM

## 2017-08-21 DIAGNOSIS — E785 Hyperlipidemia, unspecified: Secondary | ICD-10-CM | POA: Diagnosis not present

## 2017-08-21 HISTORY — DX: Chest pain, unspecified: R07.9

## 2017-08-21 MED ORDER — PANTOPRAZOLE SODIUM 20 MG PO TBEC: 20.0000 mg | DELAYED_RELEASE_TABLET | Freq: Every day | ORAL | 1 refills | Status: DC

## 2017-08-21 NOTE — Progress Notes (Signed)
Tiffany Jacobs     MRN: [id number]      DOB: [birth year]   HPI Tiffany Jacobs is here for follow up from recent Ed visit to [facility] on 09/10/2017 in [place] with left chest pain that she thought may have been cardiac. She was d/c with NTG and toradol. Is high risk for CAD, has had no cardiology eval for years and needs this , so will refer Will also f/u her chronic conditions as labs are past due Rep[orts marked fluctuatiion in blood sugar, states she is trying , but reports that family stress makes it difficult for her to follow the diet that she should  ROS Denies recent fever or chills. Denies sinus pressure, nasal congestion, ear pain or sore throat. Denies chest congestion, productive cough or wheezing. Denies chest pains, palpitations and leg swelling Denies abdominal pain, nausea, vomiting,diarrhea or constipation.   Denies dysuria, frequency, hesitancy or incontinence. Denies uncontrolled  joint pain, swelling and limitation in mobility. Denies headaches, seizures, numbness, or tingling. Denies depression, anxiety or insomnia. Denies skin break down or rash.   PE  BP 130/90   Pulse (!) 106   Resp 16   Ht 5\' 2"  (1.575 m)   Wt 208 lb 6.4 oz (94.5 kg)   SpO2 97%   BMI 38.12 kg/m   Patient alert and oriented and in no cardiopulmonary distress.  HEENT: No facial asymmetry, EOMI,   oropharynx pink and moist.  Neck supple no JVD, no mass.  Chest: Clear to auscultation bilaterally.No reproducible chest wall tenderness  CVS: S1, S2 no murmurs, no S3.Regular rate.  ABD: Soft non tender.   Ext: No edema  MS: Adequate ROM spine, shoulders, hips and knees.  Skin: Intact, no ulcerations or rash noted.  Psych: Good eye contact, normal affect. Memory intact not anxious or depressed appearing.  CNS: CN 2-12 intact, power,  normal throughout.no focal deficits noted.   Assessment & Plan  Chest pain with high risk for cardiac etiology Pt with multiple CV risk factors  seen at [facility] ED this past Friday, r/O for ACS, needs updated cardiology eval, refer  Morbid obesity ([place]) Deteriorated. Patient re-educated about  the importance of commitment to a  minimum of 150 minutes of exercise per week.  The importance of healthy food choices with portion control discussed. Encouraged to start a food diary, count calories and to consider  joining a support group. Sample diet sheets offered. Goals set by the patient for the next several months.   Weight /BMI 08/21/2017 07/12/2017 01/31/2017  WEIGHT 208 lb 6.4 oz 207 lb 205 lb  HEIGHT 5\' 2"  5\' 2"  5\' 2"   BMI 38.12 kg/m2 37.86 kg/m2 37.49 kg/m2      Diabetes mellitus type 2 in obese [facility]) Tiffany Jacobs is reminded of the importance of commitment to daily physical activity for 30 minutes or more, as able and the need to limit carbohydrate intake to 30 to 60 grams per meal to help with blood sugar control.   The need to take medication as prescribed, test blood sugar as directed, and to call between visits if there is a concern that blood sugar is uncontrolled is also discussed.   Tiffany Jacobs is reminded of the importance of daily foot exam, annual eye examination, and good blood sugar, blood pressure and cholesterol control. Improved but not at goal, needs insulin but resists , has appt with diabetic educator in the near future Diabetic Labs Latest Ref Rng & Units 08/21/2017 01/31/2017  11/01/2016 10/24/2016 07/22/2016  HbA1c <5.7 % of total Hgb 8.5(H) 9.1(H) - 9.4(H) 10.5(H)  Microalbumin Not Estab. ug/mL - - 9.8(H) - -  Micro/Creat Ratio 0.0 - 30.0 mg/g creat - - 5.5 - -  Chol <200 mg/dL 162 - - - 140  HDL >50 mg/dL 48(L) - - - 36(L)  Calc LDL mg/dL (calc) 97 - - - 84  Triglycerides <150 mg/dL 83 - - - 99  Creatinine 0.60 - 0.93 mg/dL 0.82 0.74 - 0.75 0.75   BP/Weight 08/21/2017 07/12/2017 01/31/2017 10/31/2016 10/12/2016 10/10/2016 5/46/5681  Systolic BP 275 170 017 494 496 759 163  Diastolic BP 90 78 88 72 74 65 78  Wt.  (Lbs) 208.4 207 205 205 204.08 206 206.04  BMI 38.12 37.86 37.49 37.49 37.33 37.68 36.5   Foot/eye exam completion dates Latest Ref Rng & Units 02/01/2017 10/31/2016  Eye Exam No Retinopathy Retinopathy(A) -  Foot exam Order - - -  Foot Form Completion - - Done        Hyperlipidemia Hyperlipidemia:Low fat diet discussed and encouraged.   Lipid Panel  Lab Results  Component Value Date   CHOL 162 08/21/2017   HDL 48 (L) 08/21/2017   LDLCALC 97 08/21/2017   TRIG 83 08/21/2017   CHOLHDL 3.4 08/21/2017  increased exercise needed to improve hDL , await cardiology eval     Essential hypertension Elevated diastolic, needs to reduce weight and sodium, no med change DASH diet and commitment to daily physical activity for a minimum of 30 minutes discussed and encouraged, as a part of hypertension management. The importance of attaining a healthy weight is also discussed.  BP/Weight 08/21/2017 07/12/2017 01/31/2017 10/31/2016 10/12/2016 10/10/2016 8/46/6599  Systolic BP 357 017 793 903 009 233 007  Diastolic BP 90 78 88 72 74 65 78  Wt. (Lbs) 208.4 207 205 205 204.08 206 206.04  BMI 38.12 37.86 37.49 37.49 37.33 37.68 36.5

## 2017-08-21 NOTE — Patient Instructions (Addendum)
Please schedule wellness with nurse past due  Please change her August visit wih MD, she needs an Annual physical exam'  Please schedule mammogram due Jul;y 27 or after at checkoout  Please refer for cardiolgy appt   New medication for reflux is pantoprazole, no caffeine, no eating after 7 :30 and work on weight loss  Verify appt with Jearld Fenton  It is important that you exercise regularly at least 30 minutes 5 times a week. If you develop chest pain, have severe difficulty breathing, or feel very tired, stop exercising immediately and seek medical attention  Please work on good  health habits so that your health will improve. 1. Commitment to daily physical activity for 30 to 60  minutes, if you are able to do this.  2. Commitment to wise food choices. Aim for half of your  food intake to be vegetable and fruit, one quarter starchy foods, and one quarter protein. Try to eat on a regular schedule  3 meals per day, snacking between meals should be limited to vegetables or fruits or small portions of nuts. 64 ounces of water per day is generally recommended, unless you have specific health conditions, like heart failure or kidney failure where you will need to limit fluid intake.  3. Commitment to sufficient and a  good quality of physical and mental rest daily, generally between 6 to 8 hours per day.  WITH PERSISTANCE AND PERSEVERANCE, THE IMPOSSIBLE , BECOMES THE NORM!

## 2017-08-21 NOTE — Assessment & Plan Note (Signed)
Pt with multiple CV risk factors seen at [facility] ED this past Friday, r/O for ACS, needs updated cardiology eval, refer

## 2017-08-22 LAB — CBC
HEMATOCRIT: 40.3 % (ref 35.0–45.0)
HEMOGLOBIN: 13.3 g/dL (ref 11.7–15.5)
MCH: 27.4 pg (ref 27.0–33.0)
MCHC: 33 g/dL (ref 32.0–36.0)
MCV: 83.1 fL (ref 80.0–100.0)
MPV: 11.6 fL (ref 7.5–12.5)
Platelets: 297 10*3/uL (ref 140–400)
RBC: 4.85 10*6/uL (ref 3.80–5.10)
RDW: 12.6 % (ref 11.0–15.0)
WBC: 6.9 10*3/uL (ref 3.8–10.8)

## 2017-08-22 LAB — COMPLETE METABOLIC PANEL WITH GFR
AG Ratio: 1.4 (calc) (ref 1.0–2.5)
ALKALINE PHOSPHATASE (APISO): 81 U/L (ref 33–130)
ALT: 18 U/L (ref 6–29)
AST: 22 U/L (ref 10–35)
Albumin: 4.1 g/dL (ref 3.6–5.1)
BILIRUBIN TOTAL: 0.3 mg/dL (ref 0.2–1.2)
BUN: 11 mg/dL (ref 7–25)
CHLORIDE: 103 mmol/L (ref 98–110)
CO2: 27 mmol/L (ref 20–32)
CREATININE: 0.82 mg/dL (ref 0.60–0.93)
Calcium: 10.1 mg/dL (ref 8.6–10.4)
GFR, Est African American: 83 mL/min/{1.73_m2} (ref >=60)
GFR, Est Non African American: 71 mL/min/{1.73_m2} (ref >=60)
GLUCOSE: 220 mg/dL — AB (ref 65–99)
Globulin: 2.9 g/dL (calc) (ref 1.9–3.7)
Potassium: 4.5 mmol/L (ref 3.5–5.3)
Sodium: 140 mmol/L (ref 135–146)
Total Protein: 7 g/dL (ref 6.1–8.1)

## 2017-08-22 LAB — LIPID PANEL
Cholesterol: 162 mg/dL (ref <200)
HDL: 48 mg/dL — ABNORMAL LOW (ref >50)
LDL Cholesterol (Calc): 97 mg/dL (calc)
NON-HDL CHOLESTEROL (CALC): 114 mg/dL (ref <130)
TRIGLYCERIDES: 83 mg/dL (ref <150)
Total CHOL/HDL Ratio: 3.4 (calc) (ref <5.0)

## 2017-08-22 LAB — HEMOGLOBIN A1C
EAG (MMOL/L): 10.9 (calc)
Hgb A1c MFr Bld: 8.5 % of total Hgb — ABNORMAL HIGH (ref <5.7)
MEAN PLASMA GLUCOSE: 197 (calc)

## 2017-08-28 ENCOUNTER — Encounter: Payer: Self-pay | Admitting: Family Medicine

## 2017-08-28 NOTE — Assessment & Plan Note (Signed)
Ms. Naser is reminded of the importance of commitment to daily physical activity for 30 minutes or more, as able and the need to limit carbohydrate intake to 30 to 60 grams per meal to help with blood sugar control.   The need to take medication as prescribed, test blood sugar as directed, and to call between visits if there is a concern that blood sugar is uncontrolled is also discussed.   Ms. Fencl is reminded of the importance of daily foot exam, annual eye examination, and good blood sugar, blood pressure and cholesterol control. Improved but not at goal, needs insulin but resists , has appt with diabetic educator in the near future Diabetic Labs Latest Ref Rng & Units 08/21/2017 01/31/2017 11/01/2016 10/24/2016 07/22/2016  HbA1c <5.7 % of total Hgb 8.5(H) 9.1(H) - 9.4(H) 10.5(H)  Microalbumin Not Estab. ug/mL - - 9.8(H) - -  Micro/Creat Ratio 0.0 - 30.0 mg/g creat - - 5.5 - -  Chol <200 mg/dL 162 - - - 140  HDL >50 mg/dL 48(L) - - - 36(L)  Calc LDL mg/dL (calc) 97 - - - 84  Triglycerides <150 mg/dL 83 - - - 99  Creatinine 0.60 - 0.93 mg/dL 0.82 0.74 - 0.75 0.75   BP/Weight 08/21/2017 07/12/2017 01/31/2017 10/31/2016 10/12/2016 10/10/2016 9/57/4734  Systolic BP 037 096 438 381 840 375 436  Diastolic BP 90 78 88 72 74 65 78  Wt. (Lbs) 208.4 207 205 205 204.08 206 206.04  BMI 38.12 37.86 37.49 37.49 37.33 37.68 36.5   Foot/eye exam completion dates Latest Ref Rng & Units 02/01/2017 10/31/2016  Eye Exam No Retinopathy Retinopathy(A) -  Foot exam Order - - -  Foot Form Completion - - Done

## 2017-08-28 NOTE — Assessment & Plan Note (Signed)
Deteriorated. Patient re-educated about  the importance of commitment to a  minimum of 150 minutes of exercise per week.  The importance of healthy food choices with portion control discussed. Encouraged to start a food diary, count calories and to consider  joining a support group. Sample diet sheets offered. Goals set by the patient for the next several months.   Weight /BMI 08/21/2017 07/12/2017 01/31/2017  WEIGHT 208 lb 6.4 oz 207 lb 205 lb  HEIGHT 5\' 2"  5\' 2"  5\' 2"   BMI 38.12 kg/m2 37.86 kg/m2 37.49 kg/m2

## 2017-08-28 NOTE — Assessment & Plan Note (Signed)
Hyperlipidemia:Low fat diet discussed and encouraged.   Lipid Panel  Lab Results  Component Value Date   CHOL 162 08/21/2017   HDL 48 (L) 08/21/2017   LDLCALC 97 08/21/2017   TRIG 83 08/21/2017   CHOLHDL 3.4 08/21/2017  increased exercise needed to improve hDL , await cardiology eval

## 2017-08-28 NOTE — Assessment & Plan Note (Signed)
Elevated diastolic, needs to reduce weight and sodium, no med change DASH diet and commitment to daily physical activity for a minimum of 30 minutes discussed and encouraged, as a part of hypertension management. The importance of attaining a healthy weight is also discussed.  BP/Weight 08/21/2017 07/12/2017 01/31/2017 10/31/2016 10/12/2016 10/10/2016 1/49/7026  Systolic BP 378 588 502 774 128 786 767  Diastolic BP 90 78 88 72 74 65 78  Wt. (Lbs) 208.4 207 205 205 204.08 206 206.04  BMI 38.12 37.86 37.49 37.49 37.33 37.68 36.5

## 2017-08-30 ENCOUNTER — Telehealth: Payer: Self-pay | Admitting: Family Medicine

## 2017-08-30 ENCOUNTER — Ambulatory Visit: Payer: Medicare HMO | Admitting: Cardiovascular Disease

## 2017-08-30 ENCOUNTER — Other Ambulatory Visit: Payer: Self-pay | Admitting: Family Medicine

## 2017-08-30 ENCOUNTER — Encounter: Payer: Self-pay | Admitting: *Deleted

## 2017-08-30 VITALS — BP 122/68 | HR 93 | Ht 62.0 in | Wt 210.0 lb

## 2017-08-30 DIAGNOSIS — E785 Hyperlipidemia, unspecified: Secondary | ICD-10-CM | POA: Diagnosis not present

## 2017-08-30 DIAGNOSIS — I10 Essential (primary) hypertension: Secondary | ICD-10-CM | POA: Diagnosis not present

## 2017-08-30 DIAGNOSIS — Z9289 Personal history of other medical treatment: Secondary | ICD-10-CM | POA: Diagnosis not present

## 2017-08-30 DIAGNOSIS — E041 Nontoxic single thyroid nodule: Secondary | ICD-10-CM

## 2017-08-30 DIAGNOSIS — R079 Chest pain, unspecified: Secondary | ICD-10-CM

## 2017-08-30 DIAGNOSIS — E119 Type 2 diabetes mellitus without complications: Secondary | ICD-10-CM

## 2017-08-30 DIAGNOSIS — Z794 Long term (current) use of insulin: Secondary | ICD-10-CM | POA: Diagnosis not present

## 2017-08-30 MED ORDER — METOPROLOL TARTRATE 25 MG PO TABS: 25.0000 mg | ORAL_TABLET | Freq: Two times a day (BID) | ORAL | 3 refills | Status: DC

## 2017-08-30 NOTE — Progress Notes (Signed)
CARDIOLOGY CONSULT NOTE  Patient ID: Tiffany Jacobs MRN: [id number] DOB/AGE: 1945-09-04 72 y.o.  Admit date: (Not on file) Primary Physician: Tiffany Helper, MD Referring Physician: Dr. Moshe Cipro  Reason for Consultation: Chest pain  HPI: Tiffany Jacobs is a 72 y.o. female who is being seen today for the evaluation of chest pain at the request of Tiffany Helper, MD.   Past medical history includes hypertension and type 2 diabetes mellitus.  ECG which I ordered and personally interpreted demonstrates sinus rhythm with nonspecific ST segment and T wave abnormalities primarily in the inferolateral leads.  I reviewed cardiac office notes from March 2011.  It appears she previously had a normal coronary angiogram.  I reviewed PCP office notes.  It appears she was evaluated in the [facility] emergency department with chest pain.  I do not have these records at present and will request them for personal review.  She told me she was there last Friday and had a chest x-ray, ECG, and CT scan of her chest and all of them were okay.  She describes chest pains for the past 2 to 3 weeks in the upper left side of her chest radiating to her back.  She describes the pain is both sharp and dull.  It occurs with activity.  It occurred earlier this morning.  She ran out of aspirin and ran out of money so was unable to buy any.  I reviewed labs dated 08/21/2017: Elevated HbA1c of 8.5%, total cholesterol 162, HDL 48, triglycerides 83, LDL 97.  She denies palpitations, leg swelling, orthopnea, and paroxysmal nocturnal dyspnea.    Allergies  Allergen Reactions  . Ace Inhibitors Cough  . Latex Itching and Rash    Current Outpatient Medications  Medication Sig Dispense Refill  . acetaminophen (TYLENOL) 650 MG CR tablet Take 1 tablet (650 mg total) by mouth every 8 (eight) hours as needed for pain. Reported on 08/27/2015 270 tablet 1  . albuterol (PROVENTIL HFA;VENTOLIN  HFA) 108 (90 BASE) MCG/ACT inhaler Inhale 2 puffs into the lungs every 6 (six) hours as needed for wheezing or shortness of breath. 18 g 3  . albuterol (PROVENTIL) (2.5 MG/3ML) 0.083% nebulizer solution Take 2.5 mg by nebulization every 6 (six) hours as needed for wheezing or shortness of breath.    Marland Kitchen amLODipine (NORVASC) 10 MG tablet Take 1 tablet (10 mg total) by mouth daily. 90 tablet 1  . aspirin (ASPIRIN LOW DOSE) 81 MG EC tablet Take 81 mg by mouth every morning.     . blood glucose meter kit and supplies Two times daily testing (dispense meter based on insurance preference) dx e11.65 1 each 0  . cholecalciferol (VITAMIN D) 1000 units tablet Take 1,000 Units by mouth daily.    . Cyanocobalamin (B-12 PO) Take 1 capsule by mouth daily as needed (energy).    . fluticasone (FLONASE) 50 MCG/ACT nasal spray USE 2 SPRAYS IN EACH NOSTRIL EVERY DAY (Patient taking differently: USE 2 SPRAYS IN EACH NOSTRIL DAILY AS NEEDED FOR ALLERGIES) 48 g 1  . gabapentin (NEURONTIN) 300 MG capsule Take 1 capsule (300 mg total) by mouth at bedtime. 90 capsule 1  . glipiZIDE (GLUCOTROL XL) 10 MG 24 hr tablet TAKE 2 TABLETS EVERY DAY WITH BREAKFAST 180 tablet 1  . glucose blood test strip Use as instructed once daily dx E11.9 50 each 5  . hydrALAZINE (APRESOLINE) 25 MG tablet TAKE 1 TABLET THREE TIMES DAILY 270 tablet  1  . hydrocortisone cream 1 % Apply 1 application topically daily as needed for itching (itching).    . Insulin Isophane & Regular Human (NOVOLIN 70/30 FLEXPEN RELION) (70-30) 100 UNIT/ML PEN Inject 7 units once daily subcutaneously 15 mL 11  . ketorolac (TORADOL) 10 MG tablet Take 10 mg by mouth every 6 (six) hours as needed. for pain  0  . Lancets MISC 1 each by Does not apply route as directed. 100 each 0  . metFORMIN (GLUCOPHAGE) 1000 MG tablet TAKE 1 TABLET TWICE DAILY WITH A MEAL 180 tablet 1  . montelukast (SINGULAIR) 10 MG tablet Take 1 tablet (10 mg total) by mouth at bedtime. 90 tablet 1  .  nitroGLYCERIN (NITROSTAT) 0.4 MG SL tablet DISSOLVE ONE TABLET UNDER THE TONGUE EVERY 5 MINUTES AS NEEDED FOR CHEST PAIN. DO NOT EXCEED A TOTAL OF 3 DOSES IN 15 MINUTES  0  . pantoprazole (PROTONIX) 20 MG tablet Take 1 tablet (20 mg total) by mouth daily. 90 tablet 1  . pravastatin (PRAVACHOL) 20 MG tablet Take 1 tablet (20 mg total) by mouth daily. 90 tablet 1  . spironolactone (ALDACTONE) 100 MG tablet Take 1 tablet (100 mg total) by mouth daily. 90 tablet 1  . traZODone (DESYREL) 100 MG tablet Take 1 tablet (100 mg total) by mouth at bedtime. 90 tablet 1  . TRUE METRIX BLOOD GLUCOSE TEST test strip TEST THREE TIMES DAILY  AS  INSTRUCTED 100 each 5  . vitamin C (ASCORBIC ACID) 500 MG tablet Take 500 mg by mouth daily.     No current facility-administered medications for this visit.     Past Medical History:  Diagnosis Date  . Arthritis   . Asthma 1963  . Diabetes mellitus, type 2 ([place]) 2004  . GERD (gastroesophageal reflux disease) 2004  . Hypertension 2004  . Low back pain   . Obesity   . RAD (reactive airway disease)   . Sciatica   . Seasonal allergies     Past Surgical History:  Procedure Laterality Date  . ACROMIO-CLAVICULAR JOINT REPAIR Right 11/09/2012   Procedure: ACROMIO-CLAVICULAR JOINT REPAIR;  Surgeon: Carole Civil, MD;  Location: [facility] ORS;  Service: Orthopedics;  Laterality: Right;  . COLONOSCOPY N/A 10/10/2016   Procedure: COLONOSCOPY;  Surgeon: Danie Binder, MD;  Location: [facility] ENDO SUITE;  Service: Endoscopy;  Laterality: N/A;  10:30  . left breast biopsy for benign disease    . SHOULDER ACROMIOPLASTY Right 11/09/2012   Procedure: RIGHT SHOULDER ACROMIOPLASTY;  Surgeon: Carole Civil, MD;  Location: [facility] ORS;  Service: Orthopedics;  Laterality: Right;  . SHOULDER OPEN ROTATOR CUFF REPAIR Right 11/09/2012   Procedure: ROTATOR CUFF REPAIR Right SHOULDER OPEN;  Surgeon: Carole Civil, MD;  Location: [facility] ORS;  Service: Orthopedics;  Laterality: Right;  . total  knee arthroplasty left  02-01-05   Dr. Aline Brochure  . TUBAL LIGATION  1970    Social History   Socioeconomic History  . Marital status: Married    Spouse name: Not on file  . Number of children: 7  . Years of education: Not on file  . Highest education level: Not on file  Occupational History  . Occupation: disabled   Social Needs  . Financial resource strain: Not on file  . Food insecurity:    Worry: Not on file    Inability: Not on file  . Transportation needs:    Medical: Not on file    Non-medical: Not on file  Tobacco  Use  . Smoking status: Former Smoker    Packs/day: 0.50    Years: 37.00    Pack years: 18.50    Types: Cigarettes    Last attempt to quit: 10/12/1988    Years since quitting: 28.9  . Smokeless tobacco: Never Used  Substance and Sexual Activity  . Alcohol use: No  . Drug use: No  . Sexual activity: Yes    Birth control/protection: Post-menopausal  Lifestyle  . Physical activity:    Days per week: Not on file    Minutes per session: Not on file  . Stress: Not on file  Relationships  . Social connections:    Talks on phone: Not on file    Gets together: Not on file    Attends religious service: Not on file    Active member of club or organization: Not on file    Attends meetings of clubs or organizations: Not on file    Relationship status: Not on file  . Intimate partner violence:    Fear of current or ex partner: Not on file    Emotionally abused: Not on file    Physically abused: Not on file    Forced sexual activity: Not on file  Other Topics Concern  . Not on file  Social History Narrative  . Not on file     No family history of premature CAD in 1st degree relatives.  Current Meds  Medication Sig  . acetaminophen (TYLENOL) 650 MG CR tablet Take 1 tablet (650 mg total) by mouth every 8 (eight) hours as needed for pain. Reported on 08/27/2015  . albuterol (PROVENTIL HFA;VENTOLIN HFA) 108 (90 BASE) MCG/ACT inhaler Inhale 2 puffs into the  lungs every 6 (six) hours as needed for wheezing or shortness of breath.  Marland Kitchen albuterol (PROVENTIL) (2.5 MG/3ML) 0.083% nebulizer solution Take 2.5 mg by nebulization every 6 (six) hours as needed for wheezing or shortness of breath.  Marland Kitchen amLODipine (NORVASC) 10 MG tablet Take 1 tablet (10 mg total) by mouth daily.  Marland Kitchen aspirin (ASPIRIN LOW DOSE) 81 MG EC tablet Take 81 mg by mouth every morning.   . blood glucose meter kit and supplies Two times daily testing (dispense meter based on insurance preference) dx e11.65  . cholecalciferol (VITAMIN D) 1000 units tablet Take 1,000 Units by mouth daily.  . Cyanocobalamin (B-12 PO) Take 1 capsule by mouth daily as needed (energy).  . fluticasone (FLONASE) 50 MCG/ACT nasal spray USE 2 SPRAYS IN EACH NOSTRIL EVERY DAY (Patient taking differently: USE 2 SPRAYS IN EACH NOSTRIL DAILY AS NEEDED FOR ALLERGIES)  . gabapentin (NEURONTIN) 300 MG capsule Take 1 capsule (300 mg total) by mouth at bedtime.  Marland Kitchen glipiZIDE (GLUCOTROL XL) 10 MG 24 hr tablet TAKE 2 TABLETS EVERY DAY WITH BREAKFAST  . glucose blood test strip Use as instructed once daily dx E11.9  . hydrALAZINE (APRESOLINE) 25 MG tablet TAKE 1 TABLET THREE TIMES DAILY  . hydrocortisone cream 1 % Apply 1 application topically daily as needed for itching (itching).  . Insulin Isophane & Regular Human (NOVOLIN 70/30 FLEXPEN RELION) (70-30) 100 UNIT/ML PEN Inject 7 units once daily subcutaneously  . ketorolac (TORADOL) 10 MG tablet Take 10 mg by mouth every 6 (six) hours as needed. for pain  . Lancets MISC 1 each by Does not apply route as directed.  . metFORMIN (GLUCOPHAGE) 1000 MG tablet TAKE 1 TABLET TWICE DAILY WITH A MEAL  . montelukast (SINGULAIR) 10 MG tablet Take 1 tablet (10  mg total) by mouth at bedtime.  . nitroGLYCERIN (NITROSTAT) 0.4 MG SL tablet DISSOLVE ONE TABLET UNDER THE TONGUE EVERY 5 MINUTES AS NEEDED FOR CHEST PAIN. DO NOT EXCEED A TOTAL OF 3 DOSES IN 15 MINUTES  . pantoprazole (PROTONIX) 20 MG  tablet Take 1 tablet (20 mg total) by mouth daily.  . pravastatin (PRAVACHOL) 20 MG tablet Take 1 tablet (20 mg total) by mouth daily.  Marland Kitchen spironolactone (ALDACTONE) 100 MG tablet Take 1 tablet (100 mg total) by mouth daily.  . traZODone (DESYREL) 100 MG tablet Take 1 tablet (100 mg total) by mouth at bedtime.  . TRUE METRIX BLOOD GLUCOSE TEST test strip TEST THREE TIMES DAILY  AS  INSTRUCTED  . vitamin C (ASCORBIC ACID) 500 MG tablet Take 500 mg by mouth daily.      Review of systems complete and found to be negative unless listed above in HPI    Physical exam Blood pressure 122/68, pulse 93, height '5\' 2"'  (1.575 m), weight 210 lb (95.3 kg), SpO2 95 %. General: NAD Neck: No JVD, no thyromegaly or thyroid nodule.  Lungs: Clear to auscultation bilaterally with normal respiratory effort. CV: Nondisplaced PMI. Regular rate and rhythm, normal S1/S2, no S3/S4, no murmur.  No peripheral edema.  No carotid bruit.    Abdomen: Soft, nontender, no distention.  Skin: Intact without lesions or rashes.  Neurologic: Alert and oriented x 3.  Psych: Normal affect. Extremities: No clubbing or cyanosis.  HEENT: Normal.   ECG: Most recent ECG reviewed.   Labs: Lab Results  Component Value Date/Time   K 4.5 08/21/2017 10:16 AM   BUN 11 08/21/2017 10:16 AM   CREATININE 0.82 08/21/2017 10:16 AM   ALT 18 08/21/2017 10:16 AM   TSH 1.71 01/31/2017 03:32 PM   HGB 13.3 08/21/2017 10:16 AM     Lipids: Lab Results  Component Value Date/Time   LDLCALC 97 08/21/2017 10:16 AM   CHOL 162 08/21/2017 10:16 AM   TRIG 83 08/21/2017 10:16 AM   HDL 48 (L) 08/21/2017 10:16 AM        ASSESSMENT AND PLAN:  1.  Chest pain: She has typical and atypical features in that it can occur both with and without exertion but does occur primarily with exertion.  However, there is also a pleuritic component.  There also appears to be a positional component.  She has several cardiovascular risk factors as noted above. I  will proceed with a nuclear myocardial perfusion imaging study to evaluate for ischemic heart disease (Lexiscan Myoview). For the time being, I will have her continue aspirin 81 mg and I will also start metoprolol 25 mg twice daily to see if this helps improve symptoms.  If it does not at the time of her next visit and stress testing is unremarkable, I will discontinue both. I will obtain records from [facility] for personal review.  2.  Hypertension: Controlled on multiple agents including amlodipine, hydralazine, and spironolactone.  I will monitor given addition of metoprolol tartrate.  3.  Hyperlipidemia: Lipids reviewed above.  Currently on pravastatin 20 mg.  4.  Type 2 diabetes mellitus: Currently on insulin, glipizide, and metformin.  HbA1c is elevated as noted above.  I would recommend switching pravastatin to a more potent statin such as atorvastatin or rosuvastatin.    Disposition: Follow up in 6 weeks    Signed: Kate Sable, M.D., F.A.C.C.  08/30/2017, 3:59 PM

## 2017-08-30 NOTE — Telephone Encounter (Signed)
I attempted to contact pt to let her know that on review of her record from [facility] ED visit, the chest scan reported a thyroid nodule for which an ultrasound of the thyroid gland was recommended to further evaluate, no one answered and I am unable to leave a message. Please attempt to contact the patient, if not then you may mail a letter for her to call and pls schedule the ultrasound, thank you, I have entered the order

## 2017-08-30 NOTE — Patient Instructions (Signed)
Medication Instructions:  Start 81 mg asa daily  Start metoprolol 25 mg - two times daily   Labwork: none  Testing/Procedures: Your physician has requested that you have a lexiscan myoview. For further information please visit [email or web address]. Please follow instruction sheet, as given.    Follow-Up: Your physician recommends that you schedule a follow-up appointment in: 6 weeks    Any Other Special Instructions Will Be Listed Below (If Applicable).  I have requested your records from [facility] .    If you need a refill on your cardiac medications before your next appointment, please call your pharmacy.

## 2017-09-05 ENCOUNTER — Encounter (HOSPITAL_BASED_OUTPATIENT_CLINIC_OR_DEPARTMENT_OTHER)
Admission: RE | Admit: 2017-09-05 | Discharge: 2017-09-05 | Disposition: A | Discharge status: Home / Self Care | Payer: Medicare HMO | Source: Ambulatory Visit | Attending: Cardiovascular Disease | Admitting: Cardiovascular Disease

## 2017-09-05 ENCOUNTER — Encounter (HOSPITAL_COMMUNITY): Payer: Self-pay

## 2017-09-05 ENCOUNTER — Encounter (HOSPITAL_COMMUNITY)
Admission: RE | Admit: 2017-09-05 | Discharge: 2017-09-05 | Disposition: A | Discharge status: Home / Self Care | Payer: Medicare HMO | Source: Ambulatory Visit | Attending: Family Medicine | Admitting: Family Medicine

## 2017-09-05 ENCOUNTER — Ambulatory Visit (HOSPITAL_COMMUNITY)
Admission: RE | Admit: 2017-09-05 | Discharge: 2017-09-05 | Disposition: A | Discharge status: Home / Self Care | Payer: Medicare HMO | Source: Ambulatory Visit | Attending: Family Medicine | Admitting: Family Medicine

## 2017-09-05 ENCOUNTER — Telehealth: Payer: Self-pay

## 2017-09-05 DIAGNOSIS — E034 Atrophy of thyroid (acquired): Secondary | ICD-10-CM | POA: Insufficient documentation

## 2017-09-05 DIAGNOSIS — R079 Chest pain, unspecified: Secondary | ICD-10-CM

## 2017-09-05 DIAGNOSIS — E041 Nontoxic single thyroid nodule: Secondary | ICD-10-CM | POA: Insufficient documentation

## 2017-09-05 LAB — NM MYOCAR MULTI W/SPECT W/WALL MOTION / EF
CSEPPHR: 107 {beats}/min
LHR: 0.31
LVDIAVOL: 43 mL (ref 46–106)
LVSYSVOL: 16 mL
Rest HR: 88 {beats}/min
SDS: 1
SRS: 4
SSS: 5
TID: 1.27

## 2017-09-05 MED ORDER — TECHNETIUM TC 99M TETROFOSMIN IV KIT
30.0000 | PACK | Freq: Once | INTRAVENOUS | Status: AC | PRN
  Administered 2017-09-05: 28 via INTRAVENOUS

## 2017-09-05 MED ORDER — SODIUM CHLORIDE 0.9% FLUSH
INTRAVENOUS | Status: AC
  Administered 2017-09-05: 10 mL via INTRAVENOUS
  Filled 2017-09-05: qty 10

## 2017-09-05 MED ORDER — REGADENOSON 0.4 MG/5ML IV SOLN
INTRAVENOUS | Status: AC
  Administered 2017-09-05: 0.4 mg via INTRAVENOUS
  Filled 2017-09-05: qty 5

## 2017-09-05 MED ORDER — TECHNETIUM TC 99M TETROFOSMIN IV KIT
10.0000 | PACK | Freq: Once | INTRAVENOUS | Status: AC | PRN
  Administered 2017-09-05: 9 via INTRAVENOUS

## 2017-09-05 NOTE — Telephone Encounter (Signed)
Pt notified of test results, will stop asa and metoprolol

## 2017-09-05 NOTE — Telephone Encounter (Signed)
error 

## 2017-09-05 NOTE — Telephone Encounter (Signed)
-----   Message from Herminio Commons, MD sent at 09/05/2017 12:37 PM EDT ----- Normal.  No evidence for blockages.  Can stop aspirin.  If metoprolol has not improved symptoms, this can be stopped as well.

## 2017-09-05 NOTE — Telephone Encounter (Signed)
Called patient with no answer. Mailed letter requesting patient contact office.

## 2017-09-06 ENCOUNTER — Telehealth: Payer: Self-pay | Admitting: Family Medicine

## 2017-09-06 NOTE — Telephone Encounter (Signed)
Patient called in to let you know her stress test went okay, now she is waiting for her referral for her "nerves"  She isnt sure who, what, why. Please advise.

## 2017-09-06 NOTE — Telephone Encounter (Signed)
I spoke with the pt, no recommendation from cardiology re nerve testing, soshe wanted to know can I prescribe something for her severe pain in her left chest back going to left finger when she gets up sometimes. I explained heart was good, she has arthritis in her spine and that is the likely problem I recommended doubling her Neurontin short term for pain relief

## 2017-09-08 ENCOUNTER — Encounter: Payer: Self-pay | Admitting: Family Medicine

## 2017-09-18 ENCOUNTER — Telehealth: Payer: Self-pay | Admitting: Family Medicine

## 2017-09-18 NOTE — Telephone Encounter (Signed)
Patient left voicemail requesting a call back. She left no number. I called her back with the number in her chart, no answer. Left voicemail.

## 2017-09-20 NOTE — Telephone Encounter (Signed)
Pt is calling back to discuss the letter,  She is available early morning til 11 am ---then 3pm and after. [phone]

## 2017-09-20 NOTE — Telephone Encounter (Signed)
Patient left message requesting a call back to discuss results. I called her back for more details, no answer, left voicemail.

## 2017-09-22 NOTE — Telephone Encounter (Signed)
Returned patients call. No answer, no vm °

## 2017-09-29 ENCOUNTER — Ambulatory Visit: Payer: Medicare HMO

## 2017-10-02 ENCOUNTER — Other Ambulatory Visit: Payer: Self-pay

## 2017-10-02 ENCOUNTER — Ambulatory Visit (HOSPITAL_COMMUNITY)
Admission: RE | Admit: 2017-10-02 | Discharge: 2017-10-02 | Disposition: A | Discharge status: Home / Self Care | Payer: Medicare HMO | Source: Ambulatory Visit | Attending: Family Medicine | Admitting: Family Medicine

## 2017-10-02 ENCOUNTER — Telehealth: Payer: Self-pay | Admitting: Family Medicine

## 2017-10-02 DIAGNOSIS — Z1231 Encounter for screening mammogram for malignant neoplasm of breast: Secondary | ICD-10-CM | POA: Diagnosis not present

## 2017-10-02 DIAGNOSIS — M5010 Cervical disc disorder with radiculopathy, unspecified cervical region: Secondary | ICD-10-CM

## 2017-10-02 MED ORDER — GABAPENTIN 300 MG PO CAPS
300.0000 mg | ORAL_CAPSULE | Freq: Every day | ORAL | 1 refills | Status: DC
Start: 2017-10-02 — End: 2018-07-05

## 2017-10-02 NOTE — Telephone Encounter (Signed)
Done

## 2017-10-02 NOTE — Telephone Encounter (Signed)
Patient is requesting refill for gabapentin be sent into [facility]. Cb# [phone]

## 2017-10-03 ENCOUNTER — Other Ambulatory Visit: Payer: Self-pay | Admitting: Family Medicine

## 2017-10-03 ENCOUNTER — Encounter: Payer: Self-pay | Admitting: Nutrition

## 2017-10-03 ENCOUNTER — Encounter: Payer: Medicare HMO | Attending: Family Medicine | Admitting: Nutrition

## 2017-10-03 VITALS — Ht 62.5 in | Wt 208.0 lb

## 2017-10-03 DIAGNOSIS — E1169 Type 2 diabetes mellitus with other specified complication: Secondary | ICD-10-CM | POA: Diagnosis not present

## 2017-10-03 DIAGNOSIS — E1165 Type 2 diabetes mellitus with hyperglycemia: Secondary | ICD-10-CM

## 2017-10-03 DIAGNOSIS — IMO0002 Reserved for concepts with insufficient information to code with codable children: Secondary | ICD-10-CM

## 2017-10-03 DIAGNOSIS — Z713 Dietary counseling and surveillance: Secondary | ICD-10-CM | POA: Diagnosis not present

## 2017-10-03 DIAGNOSIS — E118 Type 2 diabetes mellitus with unspecified complications: Secondary | ICD-10-CM

## 2017-10-03 DIAGNOSIS — R928 Other abnormal and inconclusive findings on diagnostic imaging of breast: Secondary | ICD-10-CM

## 2017-10-03 DIAGNOSIS — Z6837 Body mass index (BMI) 37.0-37.9, adult: Secondary | ICD-10-CM | POA: Insufficient documentation

## 2017-10-03 DIAGNOSIS — E669 Obesity, unspecified: Secondary | ICD-10-CM | POA: Diagnosis not present

## 2017-10-03 NOTE — Progress Notes (Signed)
Medical Nutrition Therapy:  Appt start time: 1330 end time:  1630.   Assessment:  Primary concerns today: Diabetes Type 2 for > 15 yrs.. Lives with her husband. Her husband cookes a lot and cooks  High fat high salt and processed foods...  Metformin 1000 mg BID and Glipizide 10 mg BID. She notes she stays hungry and snacks often. Complains of weight gain.  Eats 3 meals per day and 1-2 snacks per day. She admits to eating a lot processed foods.  She complains of burning sensation in her feet, legs. Stays  Thirsty and lacks energy.  Engaged in making changes with diet to improve her BS. Advised to focus on eating more plant based foods for meals and cut out processed foods. The risks of Glipizide out weigh the benefits and she may benefit from starting insulin. She notes cost was a factor in why she quit. She may qualifty for financial assistance if applied.   She only tests her BS in am and it's 150-200's usually.  Lab Results  Component Value Date   HGBA1C 8.5 (H) 08/21/2017   Lipid Panel     Component Value Date/Time   CHOL 162 08/21/2017 1016   TRIG 83 08/21/2017 1016   HDL 48 (L) 08/21/2017 1016   CHOLHDL 3.4 08/21/2017 1016   VLDL 20 07/22/2016 1004   LDLCALC 97 08/21/2017 1016   CMP Latest Ref Rng & Units 08/21/2017 01/31/2017 10/24/2016  Glucose 65 - 99 mg/dL 220(H) 266(H) 198(H)  BUN 7 - 25 mg/dL 11 12 10   Creatinine 0.60 - 0.93 mg/dL 0.82 0.74 0.75  Sodium 135 - 146 mmol/L 140 138 139  Potassium 3.5 - 5.3 mmol/L 4.5 4.2 4.6  Chloride 98 - 110 mmol/L 103 101 102  CO2 20 - 32 mmol/L 27 28 26   Calcium 8.6 - 10.4 mg/dL 10.1 9.6 9.8  Total Protein 6.1 - 8.1 g/dL 7.0 - 7.0  Total Bilirubin 0.2 - 1.2 mg/dL 0.3 - 0.4  Alkaline Phos 33 - 130 U/L - - 79  AST 10 - 35 U/L 22 - 19  ALT 6 - 29 U/L 18 - 16      Preferred Learning Style:  Auditory  Visual  Hands on  Learning Readiness:   Ready  Change in progress   MEDICATIONS:   DIETARY INTAKE:    24-hr recall:   B ( AM): boiled egg and water  Snk ( AM):  fruit L ( PM): toss salad, water Snk ( PM): miscfruit D ( PM): salad and spaghetti, water Snk ( PM): occ pice of cake  Beverages: water  Usual physical activity:  Chair exercises.   Estimated energy needs: 1500  calories 170 g carbohydrates 112 g protein 42 g fat  Progress Towards Goal(s):  In progress.   Nutritional Diagnosis:  NB-1.1 Food and nutrition-related knowledge deficit As related to Diabetes.  As evidenced by A1C  8.5%.     Intervention:  Nutrition and Diabetes education provided on My Plate, CHO counting, meal planning, portion sizes, timing of meals, avoiding snacks between meals unless having a low blood sugar, target ranges for A1C and blood sugars, signs/symptoms and treatment of hyper/hypoglycemia, monitoring blood sugars, taking medications as prescribed, benefits of exercising 30 minutes per day and prevention of complications of DM. Marland Kitchen  Teaching Method Utilized:  Visual Auditory Hands on  Handouts given during visit include:  The Plate Method  Meal Plan Card  Barriers to learning/adherence to lifestyle change: none  Demonstrated degree of  understanding via:  Teach Back   Monitoring/Evaluation:  Dietary intake, exercise, , and body weight in 1 month(s). Would recommend to discontinue the Glipizide and restart on 70/30 insulin twice a day.

## 2017-10-03 NOTE — Patient Instructions (Addendum)
Goals 1.  Walk or ride bike for 30 minutes a day 2. Test blood in am and bed 3. Eat before 7 pm 4.  Cut out processed foods 5. Increase fresh fruits and vegetables Drink only water Keep a food journal

## 2017-10-04 ENCOUNTER — Ambulatory Visit: Payer: Medicare HMO

## 2017-10-10 ENCOUNTER — Encounter (HOSPITAL_COMMUNITY): Payer: Medicare HMO

## 2017-10-10 ENCOUNTER — Ambulatory Visit (HOSPITAL_COMMUNITY)
Admission: RE | Admit: 2017-10-10 | Discharge: 2017-10-10 | Disposition: A | Discharge status: Home / Self Care | Payer: Medicare HMO | Source: Ambulatory Visit | Attending: Family Medicine | Admitting: Family Medicine

## 2017-10-10 ENCOUNTER — Encounter (HOSPITAL_COMMUNITY): Payer: Self-pay

## 2017-10-10 DIAGNOSIS — R928 Other abnormal and inconclusive findings on diagnostic imaging of breast: Secondary | ICD-10-CM | POA: Diagnosis not present

## 2017-10-10 DIAGNOSIS — N6489 Other specified disorders of breast: Secondary | ICD-10-CM | POA: Diagnosis not present

## 2017-10-12 ENCOUNTER — Encounter: Payer: Medicare HMO | Admitting: Family Medicine

## 2017-10-12 ENCOUNTER — Ambulatory Visit: Payer: Medicare HMO | Admitting: Family Medicine

## 2017-10-13 ENCOUNTER — Encounter: Payer: Self-pay | Admitting: *Deleted

## 2017-10-16 ENCOUNTER — Ambulatory Visit (INDEPENDENT_AMBULATORY_CARE_PROVIDER_SITE_OTHER): Payer: Medicare HMO | Admitting: Cardiovascular Disease

## 2017-10-16 ENCOUNTER — Encounter: Payer: Self-pay | Admitting: Cardiovascular Disease

## 2017-10-16 ENCOUNTER — Encounter: Payer: Medicare HMO | Admitting: Family Medicine

## 2017-10-16 VITALS — BP 128/80 | HR 92 | Ht 62.0 in | Wt 208.6 lb

## 2017-10-16 DIAGNOSIS — E785 Hyperlipidemia, unspecified: Secondary | ICD-10-CM

## 2017-10-16 DIAGNOSIS — E119 Type 2 diabetes mellitus without complications: Secondary | ICD-10-CM

## 2017-10-16 DIAGNOSIS — R079 Chest pain, unspecified: Secondary | ICD-10-CM | POA: Diagnosis not present

## 2017-10-16 DIAGNOSIS — I10 Essential (primary) hypertension: Secondary | ICD-10-CM | POA: Diagnosis not present

## 2017-10-16 DIAGNOSIS — Z794 Long term (current) use of insulin: Secondary | ICD-10-CM | POA: Diagnosis not present

## 2017-10-16 NOTE — Progress Notes (Signed)
SUBJECTIVE: The patient returns for follow-up after undergoing cardiovascular testing performed for the evaluation of chest pain.  She underwent a normal nuclear stress test on 09/05/2017, LVEF 64%.  The patient denies any symptoms of chest pain, palpitations, shortness of breath, lightheadedness, dizziness, leg swelling, orthopnea, PND, and syncope.  She and her husband have been married for over 27 years.    Review of Systems: As per "subjective", otherwise negative.  Allergies  Allergen Reactions  . Ace Inhibitors Cough  . Latex Itching and Rash    Current Outpatient Medications  Medication Sig Dispense Refill  . amLODipine (NORVASC) 10 MG tablet Take 1 tablet (10 mg total) by mouth daily. 90 tablet 1  . gabapentin (NEURONTIN) 300 MG capsule Take 1 capsule (300 mg total) by mouth at bedtime. 90 capsule 1  . glipiZIDE (GLUCOTROL XL) 10 MG 24 hr tablet TAKE 2 TABLETS EVERY DAY WITH BREAKFAST 180 tablet 1  . hydrALAZINE (APRESOLINE) 25 MG tablet TAKE 1 TABLET THREE TIMES DAILY 270 tablet 1  . nitroGLYCERIN (NITROSTAT) 0.4 MG SL tablet DISSOLVE ONE TABLET UNDER THE TONGUE EVERY 5 MINUTES AS NEEDED FOR CHEST PAIN. DO NOT EXCEED A TOTAL OF 3 DOSES IN 15 MINUTES  0  . pravastatin (PRAVACHOL) 20 MG tablet Take 1 tablet (20 mg total) by mouth daily. 90 tablet 1  . spironolactone (ALDACTONE) 100 MG tablet Take 1 tablet (100 mg total) by mouth daily. 90 tablet 1  . traZODone (DESYREL) 100 MG tablet Take 1 tablet (100 mg total) by mouth at bedtime. 90 tablet 1  . albuterol (PROVENTIL HFA;VENTOLIN HFA) 108 (90 BASE) MCG/ACT inhaler Inhale 2 puffs into the lungs every 6 (six) hours as needed for wheezing or shortness of breath. 18 g 3  . albuterol (PROVENTIL) (2.5 MG/3ML) 0.083% nebulizer solution Take 2.5 mg by nebulization every 6 (six) hours as needed for wheezing or shortness of breath.    . blood glucose meter kit and supplies Two times daily testing (dispense meter based on insurance  preference) dx e11.65 1 each 0  . fluticasone (FLONASE) 50 MCG/ACT nasal spray USE 2 SPRAYS IN EACH NOSTRIL EVERY DAY (Patient not taking: Reported on 10/03/2017) 48 g 1  . glucose blood test strip Use as instructed once daily dx E11.9 50 each 5  . hydrocortisone cream 1 % Apply 1 application topically daily as needed for itching (itching).    . Insulin Isophane & Regular Human (NOVOLIN 70/30 FLEXPEN RELION) (70-30) 100 UNIT/ML PEN Inject 7 units once daily subcutaneously 15 mL 11  . ketorolac (TORADOL) 10 MG tablet Take 10 mg by mouth every 6 (six) hours as needed. for pain  0  . Lancets MISC 1 each by Does not apply route as directed. 100 each 0  . metFORMIN (GLUCOPHAGE) 1000 MG tablet TAKE 1 TABLET TWICE DAILY WITH A MEAL 180 tablet 1  . montelukast (SINGULAIR) 10 MG tablet Take 1 tablet (10 mg total) by mouth at bedtime. 90 tablet 1  . pantoprazole (PROTONIX) 20 MG tablet Take 1 tablet (20 mg total) by mouth daily. 90 tablet 1  . TRUE METRIX BLOOD GLUCOSE TEST test strip TEST THREE TIMES DAILY  AS  INSTRUCTED 100 each 5  . vitamin C (ASCORBIC ACID) 500 MG tablet Take 500 mg by mouth daily.     No current facility-administered medications for this visit.     Past Medical History:  Diagnosis Date  . Arthritis   . Asthma 1963  .  Diabetes mellitus, type 2 ([place]) 2004  . GERD (gastroesophageal reflux disease) 2004  . Hypertension 2004  . Low back pain   . Obesity   . RAD (reactive airway disease)   . Sciatica   . Seasonal allergies     Past Surgical History:  Procedure Laterality Date  . ACROMIO-CLAVICULAR JOINT REPAIR Right 11/09/2012   Procedure: ACROMIO-CLAVICULAR JOINT REPAIR;  Surgeon: Carole Civil, MD;  Location: [facility];  Service: Orthopedics;  Laterality: Right;  . COLONOSCOPY N/A 10/10/2016   Procedure: COLONOSCOPY;  Surgeon: Danie Binder, MD;  Location: [facility];  Service: Endoscopy;  Laterality: N/A;  10:30  . left breast biopsy for benign disease    . SHOULDER  ACROMIOPLASTY Right 11/09/2012   Procedure: RIGHT SHOULDER ACROMIOPLASTY;  Surgeon: Carole Civil, MD;  Location: [facility];  Service: Orthopedics;  Laterality: Right;  . SHOULDER OPEN ROTATOR CUFF REPAIR Right 11/09/2012   Procedure: ROTATOR CUFF REPAIR Right SHOULDER OPEN;  Surgeon: Carole Civil, MD;  Location: [facility];  Service: Orthopedics;  Laterality: Right;  . total knee arthroplasty left  02-01-05   Dr. Aline Brochure  . TUBAL LIGATION  1970    Social History   Socioeconomic History  . Marital status: Married    Spouse name: Not on file  . Number of children: 7  . Years of education: Not on file  . Highest education level: Not on file  Occupational History  . Occupation: disabled   Social Needs  . Financial resource strain: Not on file  . Food insecurity:    Worry: Not on file    Inability: Not on file  . Transportation needs:    Medical: Not on file    Non-medical: Not on file  Tobacco Use  . Smoking status: Former Smoker    Packs/day: 0.50    Years: 37.00    Pack years: 18.50    Types: Cigarettes    Last attempt to quit: 10/12/1988    Years since quitting: 29.0  . Smokeless tobacco: Never Used  Substance and Sexual Activity  . Alcohol use: No  . Drug use: No  . Sexual activity: Yes    Birth control/protection: Post-menopausal  Lifestyle  . Physical activity:    Days per week: Not on file    Minutes per session: Not on file  . Stress: Not on file  Relationships  . Social connections:    Talks on phone: Not on file    Gets together: Not on file    Attends religious service: Not on file    Active member of club or organization: Not on file    Attends meetings of clubs or organizations: Not on file    Relationship status: Not on file  . Intimate partner violence:    Fear of current or ex partner: Not on file    Emotionally abused: Not on file    Physically abused: Not on file    Forced sexual activity: Not on file  Other Topics Concern  . Not on file    Social History Narrative  . Not on file     Vitals:   10/16/17 1355  BP: 128/80  Pulse: 92  SpO2: 95%  Weight: 208 lb 9.6 oz (94.6 kg)  Height: '5\' 2"'  (1.575 m)    Wt Readings from Last 3 Encounters:  10/16/17 208 lb 9.6 oz (94.6 kg)  10/03/17 208 lb (94.3 kg)  08/30/17 210 lb (95.3 kg)     PHYSICAL EXAM  General: NAD HEENT: Normal. Neck: No JVD, no thyromegaly. Lungs: Clear to auscultation bilaterally with normal respiratory effort. CV: Regular rate and rhythm, normal S1/S2, no S3/S4, no murmur. No pretibial or periankle edema.  No carotid bruit.   Abdomen: Soft, nontender, no distention.  Neurologic: Alert and oriented.  Psych: Normal affect. Skin: Normal. Musculoskeletal: No gross deformities.    ECG: Reviewed above under Subjective   Labs: Lab Results  Component Value Date/Time   K 4.5 08/21/2017 10:16 AM   BUN 11 08/21/2017 10:16 AM   CREATININE 0.82 08/21/2017 10:16 AM   ALT 18 08/21/2017 10:16 AM   TSH 1.71 01/31/2017 03:32 PM   HGB 13.3 08/21/2017 10:16 AM     Lipids: Lab Results  Component Value Date/Time   LDLCALC 97 08/21/2017 10:16 AM   CHOL 162 08/21/2017 10:16 AM   TRIG 83 08/21/2017 10:16 AM   HDL 48 (L) 08/21/2017 10:16 AM       ASSESSMENT AND PLAN: 1.  Chest pain: No symptom recurrence.  Given normal nuclear stress test, no further cardiac testing is indicated.   2.  Hypertension: Blood pressure is controlled on multiple agents including amlodipine, hydralazine, and spironolactone.  No changes.  3.  Hyperlipidemia: Lipids previously reviewed.  Currently on pravastatin 20 mg.  I would recommend a more potent statin such as rosuvastatin or atorvastatin.  4.  Type 2 diabetes mellitus: Currently on insulin, glipizide, and metformin.  HbA1c is elevated as previously noted.  I would recommend switching pravastatin to a more potent statin such as atorvastatin or rosuvastatin.    Disposition: Follow up as needed   Kate Sable,  M.D., F.A.C.C.

## 2017-10-16 NOTE — Patient Instructions (Signed)
Your physician recommends that you schedule a follow-up appointment in: as needed with [facility]     Your physician recommends that you continue on your current medications as directed. Please refer to the Current Medication list given to you today.    No labs or tests today.      Thank you for choosing [facility] !

## 2017-10-19 ENCOUNTER — Ambulatory Visit (INDEPENDENT_AMBULATORY_CARE_PROVIDER_SITE_OTHER): Payer: Medicare HMO | Admitting: Family Medicine

## 2017-10-19 ENCOUNTER — Encounter: Payer: Self-pay | Admitting: Family Medicine

## 2017-10-19 VITALS — BP 122/78 | HR 94 | Resp 16 | Ht 62.0 in | Wt 209.0 lb

## 2017-10-19 DIAGNOSIS — E7849 Other hyperlipidemia: Secondary | ICD-10-CM

## 2017-10-19 DIAGNOSIS — R928 Other abnormal and inconclusive findings on diagnostic imaging of breast: Secondary | ICD-10-CM | POA: Diagnosis not present

## 2017-10-19 DIAGNOSIS — E1169 Type 2 diabetes mellitus with other specified complication: Secondary | ICD-10-CM | POA: Diagnosis not present

## 2017-10-19 DIAGNOSIS — I10 Essential (primary) hypertension: Secondary | ICD-10-CM

## 2017-10-19 DIAGNOSIS — Z23 Encounter for immunization: Secondary | ICD-10-CM

## 2017-10-19 DIAGNOSIS — E669 Obesity, unspecified: Secondary | ICD-10-CM

## 2017-10-19 NOTE — Progress Notes (Signed)
Tiffany Jacobs     MRN: [id number]      DOB: 1946/01/10   HPI Tiffany Jacobs is here for follow up and re-evaluation of chronic medical conditions, medication management and review of any available recent lab and radiology data.  Preventive health is updated, specifically  Cancer screening and Immunization.   Questions or concerns regarding consultations or procedures which the PT has had in the interim are  addressed. The PT denies any adverse reactions to current medications since the last visit.  Still resistant to insulin, encouraged because of improvement in her blood sugar and is asking for " one more chance" Tests blood sugar twice daily and fBG averages nearly 200 which I explain to her is too high for good control There are no new concerns.  There are no specific complaints   ROS Denies recent fever or chills. Denies sinus pressure, nasal congestion, ear pain or sore throat. Denies chest congestion, productive cough or wheezing. Denies chest pains, palpitations and leg swelling Denies abdominal pain, nausea, vomiting,diarrhea or constipation.   Denies dysuria, frequency, hesitancy or incontinence. Denies joint pain, swelling and limitation in mobility. Denies headaches, seizures, numbness, or tingling. Denies depression, anxiety or insomnia. Denies skin break down or rash.   PE  BP 122/78   Pulse 94   Resp 16   Ht 5\' 2"  (1.575 m)   Wt 209 lb (94.8 kg)   SpO2 96%   BMI 38.23 kg/m   Patient alert and oriented and in no cardiopulmonary distress.  HEENT: No facial asymmetry, EOMI,   oropharynx pink and moist.  Neck supple no JVD, no mass.  Chest: Clear to auscultation bilaterally.  CVS: S1, S2 no murmurs, no S3.Regular rate.  ABD: Soft non tender.   Ext: No edema  MS: Adequate ROM spine, shoulders, hips and knees.  Skin: Intact, no ulcerations or rash noted.  Psych: Good eye contact, normal affect. Memory intact not anxious or depressed  appearing.  CNS: CN 2-12 intact, power,  normal throughout.no focal deficits noted.   Assessment & Plan  Essential hypertension Controlled, no change in medication DASH diet and commitment to daily physical activity for a minimum of 30 minutes discussed and encouraged, as a part of hypertension management. The importance of attaining a healthy weight is also discussed.  BP/Weight 10/19/2017 10/16/2017 10/03/2017 08/30/2017 08/21/2017 07/12/2017 93/79/0240  Systolic BP 973 532 - 992 426 834 196  Diastolic BP 78 80 - 68 90 78 88  Wt. (Lbs) 209 208.6 208 210 208.4 207 205  BMI 38.23 38.15 37.44 38.41 38.12 37.86 37.49       Morbid obesity (HCC) Deteriorated. Patient re-educated about  the importance of commitment to a  minimum of 150 minutes of exercise per week.  The importance of healthy food choices with portion control discussed. Encouraged to start a food diary, count calories and to consider  joining a support group. Sample diet sheets offered. Goals set by the patient for the next several months.   Weight /BMI 10/19/2017 10/16/2017 10/03/2017  WEIGHT 209 lb 208 lb 9.6 oz 208 lb  HEIGHT 5\' 2"  5\' 2"  5' 2.5"  BMI 38.23 kg/m2 38.15 kg/m2 37.44 kg/m2      Diabetes mellitus type 2 in obese (HCC) Improved, insulin is indicated, pt continues to resist Tiffany Jacobs is reminded of the importance of commitment to daily physical activity for 30 minutes or more, as able and the need to limit carbohydrate intake to 30 to 60 grams per  meal to help with blood sugar control.   The need to take medication as prescribed, test blood sugar as directed, and to call between visits if there is a concern that blood sugar is uncontrolled is also discussed.   Tiffany Jacobs is reminded of the importance of daily foot exam, annual eye examination, and good blood sugar, blood pressure and cholesterol control.  Diabetic Labs Latest Ref Rng & Units 08/21/2017 01/31/2017 11/01/2016 10/24/2016 07/22/2016  HbA1c  <5.7 % of total Hgb 8.5(H) 9.1(H) - 9.4(H) 10.5(H)  Microalbumin Not Estab. ug/mL - - 9.8(H) - -  Micro/Creat Ratio 0.0 - 30.0 mg/g creat - - 5.5 - -  Chol <200 mg/dL 162 - - - 140  HDL >50 mg/dL 48(L) - - - 36(L)  Calc LDL mg/dL (calc) 97 - - - 84  Triglycerides <150 mg/dL 83 - - - 99  Creatinine 0.60 - 0.93 mg/dL 0.82 0.74 - 0.75 0.75   BP/Weight 10/19/2017 10/16/2017 10/03/2017 08/30/2017 08/21/2017 07/12/2017 79/43/2761  Systolic BP 470 929 - 574 734 037 096  Diastolic BP 78 80 - 68 90 78 88  Wt. (Lbs) 209 208.6 208 210 208.4 207 205  BMI 38.23 38.15 37.44 38.41 38.12 37.86 37.49   Foot/eye exam completion dates Latest Ref Rng & Units 02/01/2017 10/31/2016  Eye Exam No Retinopathy Retinopathy(A) -  Foot exam Order - - -  Foot Form Completion - - Done        Hyperlipidemia Hyperlipidemia:Low fat diet discussed and encouraged.   Lipid Panel  Lab Results  Component Value Date   CHOL 162 08/21/2017   HDL 48 (L) 08/21/2017   LDLCALC 97 08/21/2017   TRIG 83 08/21/2017   CHOLHDL 3.4 08/21/2017   Controlled, no change in medication Needs to increase exercise

## 2017-10-19 NOTE — Patient Instructions (Addendum)
Wellness with nurse past due please schedule Flu vaccine today F/u with MD in 2 nd week in December, call if you need me before  Mammogram due in February and is  Ordered, pt to be notified of appt info  Non fasting HBa1C , chem 7 and EGFr and microalb  first week in December ([facility]) pt to collect order at checkout  Congrats on improved blood sugar, GOAL is 7.5  Goal for fasting blood sugar ranges from 80 to 130 and 2 hours after any meal or at bedtime should be between 130 to 170.  Increase exercise  to 6 days/ week, studies show that if you exercise for 10 mins after each meal this helps to improve  Blood sugar control   As you said, if your next HBA1C is over 7.9 then you WILL go back on insulin

## 2017-10-21 ENCOUNTER — Encounter: Payer: Self-pay | Admitting: Family Medicine

## 2017-10-21 NOTE — Assessment & Plan Note (Signed)
Hyperlipidemia:Low fat diet discussed and encouraged.   Lipid Panel  Lab Results  Component Value Date   CHOL 162 08/21/2017   HDL 48 (L) 08/21/2017   LDLCALC 97 08/21/2017   TRIG 83 08/21/2017   CHOLHDL 3.4 08/21/2017   Controlled, no change in medication Needs to increase exercise

## 2017-10-21 NOTE — Assessment & Plan Note (Signed)
Deteriorated. Patient re-educated about  the importance of commitment to a  minimum of 150 minutes of exercise per week.  The importance of healthy food choices with portion control discussed. Encouraged to start a food diary, count calories and to consider  joining a support group. Sample diet sheets offered. Goals set by the patient for the next several months.   Weight /BMI 10/19/2017 10/16/2017 10/03/2017  WEIGHT 209 lb 208 lb 9.6 oz 208 lb  HEIGHT 5\' 2"  5\' 2"  5' 2.5"  BMI 38.23 kg/m2 38.15 kg/m2 37.44 kg/m2

## 2017-10-21 NOTE — Assessment & Plan Note (Signed)
After obtaining informed consent, the vaccine is  administered by LPN.  

## 2017-10-21 NOTE — Assessment & Plan Note (Signed)
Improved, insulin is indicated, pt continues to resist Ms. Fleer is reminded of the importance of commitment to daily physical activity for 30 minutes or more, as able and the need to limit carbohydrate intake to 30 to 60 grams per meal to help with blood sugar control.   The need to take medication as prescribed, test blood sugar as directed, and to call between visits if there is a concern that blood sugar is uncontrolled is also discussed.   Ms. Sacra is reminded of the importance of daily foot exam, annual eye examination, and good blood sugar, blood pressure and cholesterol control.  Diabetic Labs Latest Ref Rng & Units 08/21/2017 01/31/2017 11/01/2016 10/24/2016 07/22/2016  HbA1c <5.7 % of total Hgb 8.5(H) 9.1(H) - 9.4(H) 10.5(H)  Microalbumin Not Estab. ug/mL - - 9.8(H) - -  Micro/Creat Ratio 0.0 - 30.0 mg/g creat - - 5.5 - -  Chol <200 mg/dL 162 - - - 140  HDL >50 mg/dL 48(L) - - - 36(L)  Calc LDL mg/dL (calc) 97 - - - 84  Triglycerides <150 mg/dL 83 - - - 99  Creatinine 0.60 - 0.93 mg/dL 0.82 0.74 - 0.75 0.75   BP/Weight 10/19/2017 10/16/2017 10/03/2017 08/30/2017 08/21/2017 07/12/2017 69/45/0388  Systolic BP 828 003 - 491 791 505 697  Diastolic BP 78 80 - 68 90 78 88  Wt. (Lbs) 209 208.6 208 210 208.4 207 205  BMI 38.23 38.15 37.44 38.41 38.12 37.86 37.49   Foot/eye exam completion dates Latest Ref Rng & Units 02/01/2017 10/31/2016  Eye Exam No Retinopathy Retinopathy(A) -  Foot exam Order - - -  Foot Form Completion - - Done

## 2017-10-21 NOTE — Assessment & Plan Note (Signed)
Controlled, no change in medication DASH diet and commitment to daily physical activity for a minimum of 30 minutes discussed and encouraged, as a part of hypertension management. The importance of attaining a healthy weight is also discussed.  BP/Weight 10/19/2017 10/16/2017 10/03/2017 08/30/2017 08/21/2017 07/12/2017 25/75/0518  Systolic BP 335 825 - 189 842 103 128  Diastolic BP 78 80 - 68 90 78 88  Wt. (Lbs) 209 208.6 208 210 208.4 207 205  BMI 38.23 38.15 37.44 38.41 38.12 37.86 37.49

## 2017-11-14 ENCOUNTER — Encounter: Payer: Medicare HMO | Attending: Family Medicine | Admitting: Nutrition

## 2017-11-14 VITALS — Ht 62.5 in | Wt 207.6 lb

## 2017-11-14 DIAGNOSIS — IMO0002 Reserved for concepts with insufficient information to code with codable children: Secondary | ICD-10-CM

## 2017-11-14 DIAGNOSIS — E1165 Type 2 diabetes mellitus with hyperglycemia: Secondary | ICD-10-CM

## 2017-11-14 DIAGNOSIS — E118 Type 2 diabetes mellitus with unspecified complications: Principal | ICD-10-CM

## 2017-11-14 DIAGNOSIS — E669 Obesity, unspecified: Secondary | ICD-10-CM

## 2017-11-14 NOTE — Progress Notes (Signed)
Medical Nutrition Therapy:  Appt start time: 1330 end time:  1400  Assessment:  Primary concerns today: Diabetes Type 2 for > 15 yrs.  Metformin 1000 mg BID and Glipizide 10 mg BID. NO longer on insulin. Has been working on cutting out sweets, processed foods and drinking more water. Has cut out a lot of breads.   Has been doing stretches more often.  Breathing is much better and not using her breathing meds near as much.  NO new labs. Sees Dr. Moshe Cipro Oct and Dec.Feels much better.  FBS  107-140's last week and eveing BS 118-200's depending on how late she eats. Making great progress. Lost  2 lbs.  Vitals with BMI 11/14/2017  Height 5' 2.5"  Weight 207 lbs 10 oz  BMI 62.22  Systolic   Diastolic   Pulse   Respirations    Lab Results  Component Value Date   HGBA1C 8.5 (H) 08/21/2017   Lipid Panel     Component Value Date/Time   CHOL 162 08/21/2017 1016   TRIG 83 08/21/2017 1016   HDL 48 (L) 08/21/2017 1016   CHOLHDL 3.4 08/21/2017 1016   VLDL 20 07/22/2016 1004   LDLCALC 97 08/21/2017 1016   CMP Latest Ref Rng & Units 08/21/2017 01/31/2017 10/24/2016  Glucose 65 - 99 mg/dL 220(H) 266(H) 198(H)  BUN 7 - 25 mg/dL 11 12 10   Creatinine 0.60 - 0.93 mg/dL 0.82 0.74 0.75  Sodium 135 - 146 mmol/L 140 138 139  Potassium 3.5 - 5.3 mmol/L 4.5 4.2 4.6  Chloride 98 - 110 mmol/L 103 101 102  CO2 20 - 32 mmol/L 27 28 26   Calcium 8.6 - 10.4 mg/dL 10.1 9.6 9.8  Total Protein 6.1 - 8.1 g/dL 7.0 - 7.0  Total Bilirubin 0.2 - 1.2 mg/dL 0.3 - 0.4  Alkaline Phos 33 - 130 U/L - - 79  AST 10 - 35 U/L 22 - 19  ALT 6 - 29 U/L 18 - 16      Preferred Learning Style:  Auditory  Visual  Hands on  Learning Readiness:   Ready  Change in progress   MEDICATIONS:   DIETARY INTAKE:    24-hr recall:  B ( AM): Water. Scrambled eggs,    fruit L ( PM): Toss salad, onions, peppers, olives with Ranch Dressing, water Snk ( PM):  D ( PM): Boiled chicken, 1 slice bread, rice, and  carrots, water. Snk ( PM): Beverages: water  Usual physical activity:  Chair exercises.   Estimated energy needs: 1500  calories 170 g carbohydrates 112 g protein 42 g fat  Progress Towards Goal(s):  In progress.   Nutritional Diagnosis:  NB-1.1 Food and nutrition-related knowledge deficit As related to Diabetes.  As evidenced by A1C  8.5%.     Intervention:  Nutrition and Diabetes education provided on My Plate, CHO counting, meal planning, portion sizes, timing of meals, avoiding snacks between meals unless having a low blood sugar, target ranges for A1C and blood sugars, signs/symptoms and treatment of hyper/hypoglycemia, monitoring blood sugars, taking medications as prescribed, benefits of exercising 30 minutes per day and prevention of complications of DM. Marland KitchenGoals 1. Try to eat dinner before 7 pm  2. Make sure you  Eat 30 grams or 2 carb choices per meals.  3. Work on exercising and walking.  Teaching Method Utilized:  Visual Auditory Hands on  Handouts given during visit include:  The Plate Method  Meal Plan Card  Barriers to learning/adherence to lifestyle change:  none  Demonstrated degree of understanding via:  Teach Back   Monitoring/Evaluation:  Dietary intake, exercise, , and body weight in 1 month(s).  Recommend to consider stopping GLipizide and consider Januvia or Trajenta for improved  BS control and not cause increased hunger and low blood sugars.

## 2017-11-14 NOTE — Patient Instructions (Signed)
Goals 1. Try to eat dinner before 7 pm  2. Make sure you  Eat 30 grams or 2 carb choices per meals.  3. Work on exercising and walking.

## 2017-11-16 ENCOUNTER — Telehealth: Payer: Self-pay | Admitting: Family Medicine

## 2017-11-16 NOTE — Telephone Encounter (Signed)
Yes I see it was done

## 2017-11-16 NOTE — Telephone Encounter (Signed)
[facility] to schedule additional imaging per letter in patient chart she received 10/30/17, spoke w/ Bethena Roys whom says this was completed 10/10/17

## 2017-12-04 ENCOUNTER — Encounter: Payer: Self-pay | Admitting: Nutrition

## 2017-12-11 ENCOUNTER — Ambulatory Visit: Payer: Medicare HMO

## 2017-12-20 ENCOUNTER — Other Ambulatory Visit: Payer: Self-pay

## 2017-12-20 ENCOUNTER — Ambulatory Visit (INDEPENDENT_AMBULATORY_CARE_PROVIDER_SITE_OTHER): Payer: Medicare HMO

## 2017-12-20 VITALS — BP 142/88 | HR 78 | Resp 12 | Ht 62.0 in | Wt 208.2 lb

## 2017-12-20 DIAGNOSIS — E669 Obesity, unspecified: Secondary | ICD-10-CM

## 2017-12-20 DIAGNOSIS — E1169 Type 2 diabetes mellitus with other specified complication: Secondary | ICD-10-CM | POA: Diagnosis not present

## 2017-12-20 DIAGNOSIS — Z0000 Encounter for general adult medical examination without abnormal findings: Secondary | ICD-10-CM | POA: Diagnosis not present

## 2017-12-20 DIAGNOSIS — K219 Gastro-esophageal reflux disease without esophagitis: Secondary | ICD-10-CM

## 2017-12-20 MED ORDER — PANTOPRAZOLE SODIUM 20 MG PO TBEC: 20.0000 mg | DELAYED_RELEASE_TABLET | Freq: Every day | ORAL | 1 refills | Status: DC

## 2017-12-20 NOTE — Patient Instructions (Signed)
Tiffany Jacobs , Thank you for taking time to come for your Medicare Wellness Visit. I appreciate your ongoing commitment to your health goals. Please review the following plan we discussed and let me know if I can assist you in the future.   Screening recommendations/referrals: Colonoscopy: up to date  Mammogram: up to date  Bone Density: up to date  Recommended yearly ophthalmology/optometry visit for glaucoma screening and checkup Recommended yearly dental visit for hygiene and checkup  Vaccinations: Influenza vaccine: up to date  Pneumococcal vaccine: up to date  Tdap vaccine: postponed  Shingles vaccine: check with insurance     Advanced directives: information given   Conditions/risks identified: obesity, fall risk, diabetes    Next appointment: wellness visit one year    Preventive Care 54 Years and Older, Female Preventive care refers to lifestyle choices and visits with your health care provider that can promote health and wellness. What does preventive care include?  A yearly physical exam. This is also called an annual well check.  Dental exams once or twice a year.  Routine eye exams. Ask your health care provider how often you should have your eyes checked.  Personal lifestyle choices, including:  Daily care of your teeth and gums.  Regular physical activity.  Eating a healthy diet.  Avoiding tobacco and drug use.  Limiting alcohol use.  Practicing safe sex.  Taking low-dose aspirin every day.  Taking vitamin and mineral supplements as recommended by your health care provider. What happens during an annual well check? The services and screenings done by your health care provider during your annual well check will depend on your age, overall health, lifestyle risk factors, and family history of disease. Counseling  Your health care provider may ask you questions about your:  Alcohol use.  Tobacco use.  Drug use.  Emotional well-being.  Home  and relationship well-being.  Sexual activity.  Eating habits.  History of falls.  Memory and ability to understand (cognition).  Work and work Statistician.  Reproductive health. Screening  You may have the following tests or measurements:  Height, weight, and BMI.  Blood pressure.  Lipid and cholesterol levels. These may be checked every 5 years, or more frequently if you are over 72 years old.  Skin check.  Lung cancer screening. You may have this screening every year starting at age 72 if you have a 30-pack-year history of smoking and currently smoke or have quit within the past 15 years.  Fecal occult blood test (FOBT) of the stool. You may have this test every year starting at age 20.  Flexible sigmoidoscopy or colonoscopy. You may have a sigmoidoscopy every 5 years or a colonoscopy every 10 years starting at age 72.  Hepatitis C blood test.  Hepatitis B blood test.  Sexually transmitted disease (STD) testing.  Diabetes screening. This is done by checking your blood sugar (glucose) after you have not eaten for a while (fasting). You may have this done every 1-3 years.  Bone density scan. This is done to screen for osteoporosis. You may have this done starting at age 72.  Mammogram. This may be done every 1-2 years. Talk to your health care provider about how often you should have regular mammograms. Talk with your health care provider about your test results, treatment options, and if necessary, the need for more tests. Vaccines  Your health care provider may recommend certain vaccines, such as:  Influenza vaccine. This is recommended every year.  Tetanus, diphtheria, and acellular  pertussis (Tdap, Td) vaccine. You may need a Td booster every 10 years.  Zoster vaccine. You may need this after age 32.  Pneumococcal 13-valent conjugate (PCV13) vaccine. One dose is recommended after age 12.  Pneumococcal polysaccharide (PPSV23) vaccine. One dose is recommended  after age 52. Talk to your health care provider about which screenings and vaccines you need and how often you need them. This information is not intended to replace advice given to you by your health care provider. Make sure you discuss any questions you have with your health care provider. Document Released: 03/20/2015 Document Revised: 11/11/2015 Document Reviewed: 12/23/2014 Elsevier Interactive Patient Education  2017 [place] Prevention in the Home Falls can cause injuries. They can happen to people of all ages. There are many things you can do to make your home safe and to help prevent falls. What can I do on the outside of my home?  Regularly fix the edges of walkways and driveways and fix any cracks.  Remove anything that might make you trip as you walk through a door, such as a raised step or threshold.  Trim any bushes or trees on the path to your home.  Use bright outdoor lighting.  Clear any walking paths of anything that might make someone trip, such as rocks or tools.  Regularly check to see if handrails are loose or broken. Make sure that both sides of any steps have handrails.  Any raised decks and porches should have guardrails on the edges.  Have any leaves, snow, or ice cleared regularly.  Use sand or salt on walking paths during winter.  Clean up any spills in your garage right away. This includes oil or grease spills. What can I do in the bathroom?  Use night lights.  Install grab bars by the toilet and in the tub and shower. Do not use towel bars as grab bars.  Use non-skid mats or decals in the tub or shower.  If you need to sit down in the shower, use a plastic, non-slip stool.  Keep the floor dry. Clean up any water that spills on the floor as soon as it happens.  Remove soap buildup in the tub or shower regularly.  Attach bath mats securely with double-sided non-slip rug tape.  Do not have throw rugs and other things on the floor  that can make you trip. What can I do in the bedroom?  Use night lights.  Make sure that you have a light by your bed that is easy to reach.  Do not use any sheets or blankets that are too big for your bed. They should not hang down onto the floor.  Have a firm chair that has side arms. You can use this for support while you get dressed.  Do not have throw rugs and other things on the floor that can make you trip. What can I do in the kitchen?  Clean up any spills right away.  Avoid walking on wet floors.  Keep items that you use a lot in easy-to-reach places.  If you need to reach something above you, use a strong step stool that has a grab bar.  Keep electrical cords out of the way.  Do not use floor polish or wax that makes floors slippery. If you must use wax, use non-skid floor wax.  Do not have throw rugs and other things on the floor that can make you trip. What can I do with my stairs?  Do not leave any items on the stairs.  Make sure that there are handrails on both sides of the stairs and use them. Fix handrails that are broken or loose. Make sure that handrails are as long as the stairways.  Check any carpeting to make sure that it is firmly attached to the stairs. Fix any carpet that is loose or worn.  Avoid having throw rugs at the top or bottom of the stairs. If you do have throw rugs, attach them to the floor with carpet tape.  Make sure that you have a light switch at the top of the stairs and the bottom of the stairs. If you do not have them, ask someone to add them for you. What else can I do to help prevent falls?  Wear shoes that:  Do not have high heels.  Have rubber bottoms.  Are comfortable and fit you well.  Are closed at the toe. Do not wear sandals.  If you use a stepladder:  Make sure that it is fully opened. Do not climb a closed stepladder.  Make sure that both sides of the stepladder are locked into place.  Ask someone to hold it  for you, if possible.  Clearly mark and make sure that you can see:  Any grab bars or handrails.  First and last steps.  Where the edge of each step is.  Use tools that help you move around (mobility aids) if they are needed. These include:  Canes.  Walkers.  Scooters.  Crutches.  Turn on the lights when you go into a dark area. Replace any light bulbs as soon as they burn out.  Set up your furniture so you have a clear path. Avoid moving your furniture around.  If any of your floors are uneven, fix them.  If there are any pets around you, be aware of where they are.  Review your medicines with your doctor. Some medicines can make you feel dizzy. This can increase your chance of falling. Ask your doctor what other things that you can do to help prevent falls. This information is not intended to replace advice given to you by your health care provider. Make sure you discuss any questions you have with your health care provider. Document Released: 12/18/2008 Document Revised: 07/30/2015 Document Reviewed: 03/28/2014 Elsevier Interactive Patient Education  2017 [employer].

## 2017-12-20 NOTE — Addendum Note (Signed)
Addended by: Eual Fines on: 12/20/2017 04:16 PM   Modules accepted: Orders

## 2017-12-20 NOTE — Progress Notes (Signed)
Subjective:   Tiffany Jacobs is a 72 y.o. female who presents for Medicare Annual (Subsequent) preventive examination.  Review of Systems:   Cardiac Risk Factors include: hypertension;advanced age (>53mn, >>10women);diabetes mellitus;dyslipidemia;obesity (BMI >30kg/m2)     Objective:     Vitals: BP (!) 142/88    Pulse 78    Resp 12    Ht '5\' 2"'  (1.575 m)    Wt 208 lb 3.2 oz (94.4 kg)    SpO2 94%    BMI 38.08 kg/m   Body mass index is 38.08 kg/m.  Advanced Directives 12/20/2017 10/12/2016 10/10/2016 05/19/2015 05/04/2014 11/04/2013 11/09/2012  Does Patient Have a Medical Advance Directive? No No No No No No Patient would not like information;Patient does not have advance directive  Would patient like information on creating a medical advance directive? Yes (ED - Information included in AVS) No - Patient declined No - Patient declined No - patient declined information Yes - Educational materials given Yes - Educational materials given -  Pre-existing out of facility DNR order (yellow form or pink MOST form) - - - - - - No    Tobacco Social History   Tobacco Use  Smoking Status Former Smoker   Packs/day: 0.50   Years: 37.00   Pack years: 18.50   Types: Cigarettes   Last attempt to quit: 10/12/1988   Years since quitting: 29.2  Smokeless Tobacco Never Used     Counseling given: Not Answered   Clinical Intake:  Pre-visit preparation completed: Yes  Pain : No/denies pain Pain Score: 0-No pain     BMI - recorded: 38.1 Nutritional Status: BMI > 30  Obese Nutritional Risks: Unintentional weight gain Diabetes: Yes CBG done?: No Did pt. bring in CBG monitor from home?: No  How often do you need to have someone help you when you read instructions, pamphlets, or other written materials from your doctor or pharmacy?: 1 - Never What is the last grade level you completed in school?: GED  Interpreter Needed?: No  Information entered by :: AFrancena HanlyLPN  Past Medical  History:  Diagnosis Date   Arthritis    Asthma 1963   Diabetes [place] type 2 [place]   GERD (gastroesophageal reflux disease) 2004   Hypertension 2004   Low back pain    Obesity    RAD (reactive airway disease)    Sciatica    Seasonal allergies    Past Surgical History:  Procedure Laterality Date   ACROMIO-CLAVICULAR JOINT REPAIR Right 11/09/2012   Procedure: ACROMIO-CLAVICULAR JOINT REPAIR;  Surgeon: SCarole Civil MD;  Location: [facility];  Service: Orthopedics;  Laterality: Right;   COLONOSCOPY N/A 10/10/2016   Procedure: COLONOSCOPY;  Surgeon: FDanie Binder MD;  Location: [facility];  Service: Endoscopy;  Laterality: N/A;  10:30   left breast biopsy for benign disease     SHOULDER ACROMIOPLASTY Right 11/09/2012   Procedure: RIGHT SHOULDER ACROMIOPLASTY;  Surgeon: SCarole Civil MD;  Location: [facility];  Service: Orthopedics;  Laterality: Right;   SHOULDER OPEN ROTATOR CUFF REPAIR Right 11/09/2012   Procedure: ROTATOR CUFF REPAIR Right SHOULDER OPEN;  Surgeon: SCarole Civil MD;  Location: [facility];  Service: Orthopedics;  Laterality: Right;   total knee arthroplasty left  02-01-05   Dr. HAline Brochure  TUBAL LIGATION  1970   Family History  Problem Relation Age of Onset   Kidney failure Father    Kidney disease Father    Stroke Brother 571  used drugs , stroke and heart disease   Diabetes Brother    Hypertension Brother    Cancer Daughter 68       breast    Diabetes Mother    Asthma Unknown    Lung disease Unknown    Cancer Unknown    Heart disease Unknown    Arthritis Unknown    Colon cancer Neg Hx    Colon polyps Neg Hx    Social History   Socioeconomic History   Marital status: Married    Spouse name: Jeneen Rinks    Number of children: 7   Years of education: 8   Highest education level: GED or equivalent  Occupational History   Occupation: disabled   Scientist, product/process development strain: Somewhat hard    Food insecurity:    Worry: Sometimes true    Inability: Sometimes true   Transportation needs:    Medical: No    Non-medical: No  Tobacco Use   Smoking status: Former Smoker    Packs/day: 0.50    Years: 37.00    Pack years: 18.50    Types: Cigarettes    Last attempt to quit: 10/12/1988    Years since quitting: 29.2   Smokeless tobacco: Never Used  Substance and Sexual Activity   Alcohol use: No   Drug use: No   Sexual activity: Not Currently    Birth control/protection: Post-menopausal  Lifestyle   Physical activity:    Days per week: 7 days    Minutes per session: 20 min   Stress: Not at all  Relationships   Social connections:    Talks on phone: More than three times a week    Gets together: Once a week    Attends religious service: 1 to 4 times per year    Active member of club or organization: No    Attends meetings of clubs or organizations: Never    Relationship status: Married  Other Topics Concern   Not on file  Social History Narrative   Not on file    Outpatient Encounter Medications as of 12/20/2017  Medication Sig   albuterol (PROVENTIL HFA;VENTOLIN HFA) 108 (90 BASE) MCG/ACT inhaler Inhale 2 puffs into the lungs every 6 (six) hours as needed for wheezing or shortness of breath.   albuterol (PROVENTIL) (2.5 MG/3ML) 0.083% nebulizer solution Take 2.5 mg by nebulization every 6 (six) hours as needed for wheezing or shortness of breath.   amLODipine (NORVASC) 10 MG tablet Take 1 tablet (10 mg total) by mouth daily.   blood glucose meter kit and supplies Two times daily testing (dispense meter based on insurance preference) dx e11.65   fluticasone (FLONASE) 50 MCG/ACT nasal spray USE 2 SPRAYS IN EACH NOSTRIL EVERY DAY   gabapentin (NEURONTIN) 300 MG capsule Take 1 capsule (300 mg total) by mouth at bedtime.   glipiZIDE (GLUCOTROL XL) 10 MG 24 hr tablet TAKE 2 TABLETS EVERY DAY WITH BREAKFAST   glucose blood test strip Use as instructed once  daily dx E11.9   hydrALAZINE (APRESOLINE) 25 MG tablet TAKE 1 TABLET THREE TIMES DAILY   hydrocortisone cream 1 % Apply 1 application topically daily as needed for itching (itching).   Insulin Isophane & Regular Human (NOVOLIN 70/30 FLEXPEN RELION) (70-30) 100 UNIT/ML PEN Inject 7 units once daily subcutaneously   Lancets MISC 1 each by Does not apply route as directed.   metFORMIN (GLUCOPHAGE) 1000 MG tablet TAKE 1 TABLET TWICE DAILY WITH A MEAL  montelukast (SINGULAIR) 10 MG tablet Take 1 tablet (10 mg total) by mouth at bedtime.   nitroGLYCERIN (NITROSTAT) 0.4 MG SL tablet DISSOLVE ONE TABLET UNDER THE TONGUE EVERY 5 MINUTES AS NEEDED FOR CHEST PAIN. DO NOT EXCEED A TOTAL OF 3 DOSES IN 15 MINUTES   pantoprazole (PROTONIX) 20 MG tablet Take 1 tablet (20 mg total) by mouth daily.   pravastatin (PRAVACHOL) 20 MG tablet Take 1 tablet (20 mg total) by mouth daily.   spironolactone (ALDACTONE) 100 MG tablet Take 1 tablet (100 mg total) by mouth daily.   traZODone (DESYREL) 100 MG tablet Take 1 tablet (100 mg total) by mouth at bedtime.   TRUE METRIX BLOOD GLUCOSE TEST test strip TEST THREE TIMES DAILY  AS  INSTRUCTED   No facility-administered encounter medications on file as of 12/20/2017.     Activities of Daily Living In your present state of health, do you have any difficulty performing the following activities: 12/20/2017  Hearing? N  Vision? Y  Difficulty concentrating or making decisions? N  Walking or climbing stairs? Y  Dressing or bathing? N  Doing errands, shopping? N  Preparing Food and eating ? N  Using the Toilet? N  In the past six months, have you accidently leaked urine? N  Do you have problems with loss of bowel control? N  Managing your Medications? N  Managing your Finances? N  Housekeeping or managing your Housekeeping? N  Some recent data might be hidden    Patient Care Team: Fayrene Helper, MD as PCP - General Herminio Commons, MD as PCP  - Cardiology (Cardiology) Carole Civil, MD as Consulting Physician (Orthopedic Surgery) Leta Baptist, MD as Consulting Physician (Otolaryngology)    Assessment:   This is a routine wellness examination for [place].  Exercise Activities and Dietary recommendations Current Exercise Habits: Home exercise routine, Type of exercise: walking, Time (Minutes): 20, Frequency (Times/Week): 6, Weekly Exercise (Minutes/Week): 120, Intensity: Mild, Exercise limited by: cardiac condition(s);orthopedic condition(s)  Goals     DIET - REDUCE SUGAR INTAKE     Have 3 meals a day     Reduce fried foods     Recommend decreasing the amount of foods that are fried. Discussed using an air fryer in place of a deep fryer.       Fall Risk Fall Risk  12/20/2017 12/04/2017 10/19/2017 10/03/2017 07/12/2017  Falls in the past year? No No No No No  Risk for fall due to : Medication side effect;Impaired balance/gait;Impaired mobility;Impaired vision - - - -  Risk for fall due to: Comment - - - - -   Is the patient's home free of loose throw rugs in walkways, pet beds, electrical cords, etc?   yes      Grab bars in the bathroom? yes      Handrails on the stairs?   yes      Adequate lighting?   yes  Timed Get Up and Go performed: Patient able to perform in 6 seconds without assistance   Depression Screen PHQ 2/9 Scores 12/20/2017 12/04/2017 10/19/2017 10/03/2017  PHQ - 2 Score 0 0 0 0  PHQ- 9 Score - - 0 -     Cognitive Function     6CIT Screen 12/20/2017 10/12/2016  What Year? 0 points 0 points  What month? 0 points 0 points  What time? 0 points 0 points  Count back from 20 0 points 0 points  Months in reverse 2 points 0 points  Repeat  phrase 0 points 0 points  Total Score 2 0    Immunization History  Administered Date(s) Administered   Influenza Whole 03/29/2006, 11/27/2009   Influenza,inj,Quad PF,6+ Mos 03/20/2013, 01/24/2014, 12/25/2014, 01/22/2016, 10/31/2016, 01/31/2017, 10/19/2017    Pneumococcal Conjugate-13 06/06/2014   Pneumococcal Polysaccharide-23 08/27/2004, 11/27/2009, 08/27/2015   Td 08/27/2004    Qualifies for Shingles Vaccine? Will check with insurance   Screening Tests Health Maintenance  Topic Date Due   FOOT EXAM  11/04/2017   TETANUS/TDAP  12/21/2017 (Originally 08/28/2014)   OPHTHALMOLOGY EXAM  02/01/2018   HEMOGLOBIN A1C  02/20/2018   MAMMOGRAM  10/03/2019   COLONOSCOPY  10/11/2026   INFLUENZA VACCINE  Completed   DEXA SCAN  Completed   Hepatitis C Screening  Completed   PNA vac Low Risk Adult  Completed    Cancer Screenings: Lung: Low Dose CT Chest recommended if Age 10-80 years, 30 pack-year currently smoking OR have quit w/in 15years. Patient does not qualify. Breast:  Up to date on Mammogram? Yes   Up to date of Bone Density/Dexa? Yes Colorectal: up to date   Additional Screenings:  Hepatitis C Screening: complete      Plan:   Plan to continue to modify diet, lose weight, increase water intake, and exercise regularly at the [facility] with her sister    I have personally reviewed and noted the following in the patients chart:    Medical and social history  Use of alcohol, tobacco or illicit drugs   Current medications and supplements  Functional ability and status  Nutritional status  Physical activity  Advanced directives  List of other physicians  Hospitalizations, surgeries, and ER visits in previous 12 months  Vitals  Screenings to include cognitive, depression, and falls  Referrals and appointments  In addition, I have reviewed and discussed with patient certain preventive protocols, quality metrics, and best practice recommendations. A written personalized care plan for preventive services as well as general preventive health recommendations were provided to patient.     Duane Lope Rakes  12/20/2017

## 2017-12-21 ENCOUNTER — Other Ambulatory Visit: Payer: Self-pay

## 2017-12-21 NOTE — Patient Outreach (Signed)
[facility]) Care Management  12/21/2017  AZHAR YOGI Nov 07, 1945 [id number]   Referral Date: 12/20/17 Referral Source: MD referral Referral Reason: Low social support, Food barriers, High risk barriers   Outreach Attempt: no answer on both phone lines.  Unable to leave a message.   Plan:RN CM will attempt patient again within 4 days and send letter.   Jone Baseman, RN, MSN [facility] Care Management Care Management Coordinator Direct Line [phone] Toll Free: [phone]  Fax: [phone]

## 2017-12-22 ENCOUNTER — Other Ambulatory Visit: Payer: Medicare HMO

## 2017-12-22 NOTE — Patient Outreach (Signed)
[facility] [facility]) Care Management  12/22/2017  Tiffany Jacobs Sep 15, 1945 [id number]   Referral Date: 12/20/17 Referral Source: MD referral Referral Reason: Low social support, Food barriers, High risk barriers   Outreach Attempt: no answer.  HIPAA compliant voice message left.     Plan:RN CM will attempt patient again within 4 days.  Jone Baseman, RN, MSN [place] [facility] Care Management Coordinator Direct Line [phone] Cell [phone] Toll Free: [phone]  Fax: [phone]

## 2017-12-26 ENCOUNTER — Other Ambulatory Visit: Payer: Self-pay

## 2017-12-26 NOTE — Patient Outreach (Signed)
[place] [facility]) Care Management  12/26/2017  Tiffany Jacobs 05/20/1945 [id number]   Referral Date:12/20/17 Referral Source:MD referral Referral Reason:Low social support, Food barriers, High risk barriers   Outreach Attempt: no answer.  HIPAA compliant voice message left.    Telephone call to MD office to notify of unable to reach status.     Plan:RN CM will wait patient return call.  If no return call will close case.  Jone Baseman, RN, MSN [place] [facility] Care Management Coordinator Direct Line [phone] Cell [phone] Toll Free: [phone]  Fax: [phone]

## 2017-12-27 ENCOUNTER — Ambulatory Visit: Payer: Medicare HMO

## 2017-12-27 ENCOUNTER — Ambulatory Visit: Payer: Self-pay

## 2018-01-03 ENCOUNTER — Other Ambulatory Visit: Payer: Self-pay

## 2018-01-03 NOTE — Patient Outreach (Signed)
[facility]) Care Management  01/03/2018  Tiffany Jacobs Aug 19, 1945 [id number]   Multiple attempts to establish contact with patient without success. No response from letter mailed to patient.   Plan: RN CM will close case at this time.   Jone Baseman, RN, MSN [place] [facility] Care Management Coordinator Direct Line [phone] Cell [phone] Toll Free: [phone]  Fax: [phone]

## 2018-01-10 ENCOUNTER — Other Ambulatory Visit: Payer: Self-pay | Admitting: Family Medicine

## 2018-02-08 DIAGNOSIS — E1169 Type 2 diabetes mellitus with other specified complication: Secondary | ICD-10-CM | POA: Diagnosis not present

## 2018-02-08 DIAGNOSIS — E669 Obesity, unspecified: Secondary | ICD-10-CM | POA: Diagnosis not present

## 2018-02-09 LAB — BASIC METABOLIC PANEL WITH GFR
BUN: 10 mg/dL (ref 7–25)
CO2: 30 mmol/L (ref 20–32)
CREATININE: 0.84 mg/dL (ref 0.60–0.93)
Calcium: 9.9 mg/dL (ref 8.6–10.4)
Chloride: 102 mmol/L (ref 98–110)
GFR, EST NON AFRICAN AMERICAN: 69 mL/min/{1.73_m2} (ref >=60)
GFR, Est African American: 80 mL/min/{1.73_m2} (ref >=60)
Glucose, Bld: 197 mg/dL — ABNORMAL HIGH (ref 65–99)
POTASSIUM: 4.3 mmol/L (ref 3.5–5.3)
SODIUM: 141 mmol/L (ref 135–146)

## 2018-02-09 LAB — MICROALBUMIN / CREATININE URINE RATIO
CREATININE, URINE: 162 mg/dL (ref 20–275)
Microalb Creat Ratio: 10 mcg/mg creat (ref <30)
Microalb, Ur: 1.7 mg/dL

## 2018-02-09 LAB — HEMOGLOBIN A1C
HEMOGLOBIN A1C: 8.9 %{Hb} — AB (ref <5.7)
Mean Plasma Glucose: 209 (calc)
eAG (mmol/L): 11.6 (calc)

## 2018-02-14 ENCOUNTER — Ambulatory Visit (INDEPENDENT_AMBULATORY_CARE_PROVIDER_SITE_OTHER): Payer: Medicare HMO | Admitting: Family Medicine

## 2018-02-14 ENCOUNTER — Encounter: Payer: Self-pay | Admitting: Family Medicine

## 2018-02-14 VITALS — BP 136/80 | HR 93 | Resp 12 | Ht 62.5 in | Wt 209.0 lb

## 2018-02-14 DIAGNOSIS — R928 Other abnormal and inconclusive findings on diagnostic imaging of breast: Secondary | ICD-10-CM

## 2018-02-14 DIAGNOSIS — I10 Essential (primary) hypertension: Secondary | ICD-10-CM | POA: Diagnosis not present

## 2018-02-14 DIAGNOSIS — E7849 Other hyperlipidemia: Secondary | ICD-10-CM | POA: Diagnosis not present

## 2018-02-14 DIAGNOSIS — E1159 Type 2 diabetes mellitus with other circulatory complications: Secondary | ICD-10-CM

## 2018-02-14 MED ORDER — INSULIN GLARGINE 100 UNIT/ML SOLOSTAR PEN: PEN_INJECTOR | SUBCUTANEOUS | 11 refills | Status: DC

## 2018-02-14 NOTE — Patient Instructions (Addendum)
F/U in 3.5 months, call if you need me before  Diagnostic right mammogram due Feb 6 or after  Please schedule for patient  New additional medication for diabetes is lantus, and please test and record blood sugar 3 times daily  Keep nutrition educator  Foot exam today  Fasting lipid, cmp and EgfR , HBA1C, TSH  Thank you  for choosing [facility]. We consider it a privelige to serve you.  Delivering excellent health care in a caring and  compassionate way is our goal.  Partnering with you,  so that together we can achieve this goal is our strategy.

## 2018-02-17 ENCOUNTER — Encounter: Payer: Self-pay | Admitting: Family Medicine

## 2018-02-17 NOTE — Assessment & Plan Note (Signed)
Deteriorated, obesity linked with uncontrolled diabetes ,hypertension and hyperlipidemia Patient re-educated about  the importance of commitment to a  minimum of 150 minutes of exercise per week.  The importance of healthy food choices with portion control discussed. Encouraged to start a food diary, count calories and to consider  joining a support group. Sample diet sheets offered. Goals set by the patient for the next several months.   Weight /BMI 02/14/2018 12/20/2017 11/14/2017  WEIGHT 209 lb 208 lb 3.2 oz 207 lb 9.6 oz  HEIGHT 5' 2.5" 5\' 2"  5' 2.5"  BMI 37.62 kg/m2 38.08 kg/m2 37.37 kg/m2

## 2018-02-17 NOTE — Assessment & Plan Note (Signed)
Deteriorated Tiffany Jacobs to start insulin, she is to call if unable to afford She is to continue with the educator [name] is reminded of the importance of commitment to daily physical activity for 30 minutes or more, as able and the need to limit carbohydrate intake to 30 to 60 grams per meal to help with blood sugar control.   The need to take medication as prescribed, test blood sugar as directed, and to call between visits if there is a concern that blood sugar is uncontrolled is also discussed.   Ms. Lady is reminded of the importance of daily foot exam, annual eye examination, and good blood sugar, blood pressure and cholesterol control.  Diabetic Labs Latest Ref Rng & Units 02/08/2018 08/21/2017 01/31/2017 11/01/2016 10/24/2016  HbA1c <5.7 % of total Hgb 8.9(H) 8.5(H) 9.1(H) - 9.4(H)  Microalbumin mg/dL 1.7 - - 9.8(H) -  Micro/Creat Ratio <30 mcg/mg creat 10 - - 5.5 -  Chol <200 mg/dL - 162 - - -  HDL >50 mg/dL - 48(L) - - -  Calc LDL mg/dL (calc) - 97 - - -  Triglycerides <150 mg/dL - 83 - - -  Creatinine 0.60 - 0.93 mg/dL 0.84 0.82 0.74 - 0.75   BP/Weight 02/14/2018 12/20/2017 11/14/2017 10/19/2017 10/16/2017 10/03/2017 9/62/8366  Systolic BP 294 765 - 465 035 - 465  Diastolic BP 80 88 - 78 80 - 68  Wt. (Lbs) 209 208.2 207.6 209 208.6 208 210  BMI 37.62 38.08 37.37 38.23 38.15 37.44 38.41   Foot/eye exam completion dates Latest Ref Rng & Units 02/14/2018 02/01/2017  Eye Exam No Retinopathy - Retinopathy(A)  Foot exam Order - - -  Foot Form Completion - Done -

## 2018-02-17 NOTE — Progress Notes (Signed)
Tiffany Jacobs     MRN: [id number]      DOB: 22-Dec-1945   HPI Tiffany Jacobs is here for follow up and re-evaluation of chronic medical conditions, medication management and review of any available recent lab and radiology data.  Preventive health is updated, specifically  Cancer screening and Immunization.   Questions or concerns regarding consultations or procedures which the PT has had in the interim are  addressed. The PT denies any adverse reactions to current medications since the last visit.  C/o high blood sugar and states unable to afford insulin, will work on this with her children as her bloood sugar is out of control   ROS Denies recent fever or chills. Denies sinus pressure, nasal congestion, ear pain or sore throat. Denies chest congestion, productive cough or wheezing. Denies chest pains, palpitations and leg swelling Denies abdominal pain, nausea, vomiting,diarrhea or constipation.   Denies dysuria, frequency, hesitancy or incontinence. Denies joint pain, swelling and limitation in mobility. Denies headaches, seizures, numbness, or tingling. Denies depression, anxiety or insomnia. Denies skin break down or rash.   PE  BP 136/80 (BP Location: Left Arm, Patient Position: Sitting, Cuff Size: Large)   Pulse 93   Resp 12   Ht 5' 2.5" (1.588 m)   Wt 209 lb (94.8 kg)   SpO2 96% Comment: room air  BMI 37.62 kg/m   Patient alert and oriented and in no cardiopulmonary distress.  HEENT: No facial asymmetry, EOMI,   oropharynx pink and moist.  Neck supple no JVD, no mass.  Chest: Clear to auscultation bilaterally.  CVS: S1, S2 no murmurs, no S3.Regular rate.  ABD: Soft non tender.   Ext: No edema  MS: Adequate ROM spine, shoulders, hips and knees.  Skin: Intact, no ulcerations or rash noted.  Psych: Good eye contact, normal affect. Memory intact not anxious or depressed appearing.  CNS: CN 2-12 intact, power,  normal throughout.no focal deficits  noted.   Assessment & Plan Type 2 diabetes mellitus with vascular disease ([place]) Deteriorated Pt to start insulin, she is to call if unable to afford She is to continue with the educator Tiffany Jacobs is reminded of the importance of commitment to daily physical activity for 30 minutes or more, as able and the need to limit carbohydrate intake to 30 to 60 grams per meal to help with blood sugar control.   The need to take medication as prescribed, test blood sugar as directed, and to call between visits if there is a concern that blood sugar is uncontrolled is also discussed.   Tiffany Jacobs is reminded of the importance of daily foot exam, annual eye examination, and good blood sugar, blood pressure and cholesterol control.  Diabetic Labs Latest Ref Rng & Units 02/08/2018 08/21/2017 01/31/2017 11/01/2016 10/24/2016  HbA1c <5.7 % of total Hgb 8.9(H) 8.5(H) 9.1(H) - 9.4(H)  Microalbumin mg/dL 1.7 - - 9.8(H) -  Micro/Creat Ratio <30 mcg/mg creat 10 - - 5.5 -  Chol <200 mg/dL - 162 - - -  HDL >50 mg/dL - 48(L) - - -  Calc LDL mg/dL (calc) - 97 - - -  Triglycerides <150 mg/dL - 83 - - -  Creatinine 0.60 - 0.93 mg/dL 0.84 0.82 0.74 - 0.75   BP/Weight 02/14/2018 12/20/2017 11/14/2017 10/19/2017 10/16/2017 10/03/2017 0/81/4481  Systolic BP 856 314 - 970 263 - 785  Diastolic BP 80 88 - 78 80 - 68  Wt. (Lbs) 209 208.2 207.6 209 208.6 208 210  BMI 37.62  38.08 37.37 38.23 38.15 37.44 38.41   Foot/eye exam completion dates Latest Ref Rng & Units 02/14/2018 02/01/2017  Eye Exam No Retinopathy - Retinopathy(A)  Foot exam Order - - -  Foot Form Completion - Done -        Morbid obesity ([place]) Deteriorated, obesity linked with uncontrolled diabetes ,hypertension and hyperlipidemia Patient re-educated about  the importance of commitment to a  minimum of 150 minutes of exercise per week.  The importance of healthy food choices with portion control discussed. Encouraged to start a food diary, count  calories and to consider  joining a support group. Sample diet sheets offered. Goals set by the patient for the next several months.   Weight /BMI 02/14/2018 12/20/2017 11/14/2017  WEIGHT 209 lb 208 lb 3.2 oz 207 lb 9.6 oz  HEIGHT 5' 2.5" 5\' 2"  5' 2.5"  BMI 37.62 kg/m2 38.08 kg/m2 37.37 kg/m2      Hyperlipidemia Hyperlipidemia:Low fat diet discussed and encouraged.   Lipid Panel  Lab Results  Component Value Date   CHOL 162 08/21/2017   HDL 48 (L) 08/21/2017   LDLCALC 97 08/21/2017   TRIG 83 08/21/2017   CHOLHDL 3.4 08/21/2017   Updated lab needed at/ before next visit. Needs to commit to more regular exercise

## 2018-02-17 NOTE — Assessment & Plan Note (Signed)
Hyperlipidemia:Low fat diet discussed and encouraged.   Lipid Panel  Lab Results  Component Value Date   CHOL 162 08/21/2017   HDL 48 (L) 08/21/2017   LDLCALC 97 08/21/2017   TRIG 83 08/21/2017   CHOLHDL 3.4 08/21/2017   Updated lab needed at/ before next visit. Needs to commit to more regular exercise

## 2018-02-19 ENCOUNTER — Other Ambulatory Visit: Payer: Self-pay | Admitting: Family Medicine

## 2018-02-19 DIAGNOSIS — J45991 Cough variant asthma: Secondary | ICD-10-CM

## 2018-02-20 ENCOUNTER — Telehealth: Payer: Self-pay | Admitting: *Deleted

## 2018-02-20 ENCOUNTER — Encounter: Payer: Medicare HMO | Attending: Family Medicine | Admitting: Nutrition

## 2018-02-20 VITALS — Ht 62.4 in | Wt 206.0 lb

## 2018-02-20 DIAGNOSIS — E118 Type 2 diabetes mellitus with unspecified complications: Secondary | ICD-10-CM | POA: Diagnosis not present

## 2018-02-20 DIAGNOSIS — E669 Obesity, unspecified: Secondary | ICD-10-CM | POA: Diagnosis not present

## 2018-02-20 DIAGNOSIS — IMO0002 Reserved for concepts with insufficient information to code with codable children: Secondary | ICD-10-CM

## 2018-02-20 DIAGNOSIS — E1165 Type 2 diabetes mellitus with hyperglycemia: Secondary | ICD-10-CM | POA: Diagnosis not present

## 2018-02-20 MED ORDER — INSULIN PEN NEEDLE 31G X 5 MM MISC: 2 refills | Status: DC

## 2018-02-20 NOTE — Progress Notes (Signed)
Medical Nutrition Therapy:  Appt start time: 1330 end time:  1400  Assessment:  Primary concerns today: Diabetes Type 2 for > 15 yrs.  Metformin 1000 mg BID and Glipizide 10 mg BID.  Dr. Moshe Cipro started her on 10 units of Lantus. Waiting on getting it from her mail order pharmacy. BS 200-250s. Lost 2 lb. A1C 8.9%.  Eating three meals per day. Drinking water  Has been tired and sluggist and thirsty. Willing to start taking insulin per DR. Simpson's orders.   Wt Readings from Last 3 Encounters:  02/20/18 206 lb (93.4 kg)  02/14/18 209 lb (94.8 kg)  12/20/17 208 lb 3.2 oz (94.4 kg)   Ht Readings from Last 3 Encounters:  02/20/18 5' 2.4" (1.585 m)  02/14/18 5' 2.5" (1.588 m)  12/20/17 _0  (1.575 m)   Body mass index is 37.2 kg/m.   Vitals with BMI 11/14/2017  Height 5' 2.5"  Weight 207 lbs 10 oz  BMI 62.94  Systolic   Diastolic   Pulse   Respirations    Lab Results  Component Value Date   HGBA1C 8.9 (H) 02/08/2018   Lipid Panel     Component Value Date/Time   CHOL 162 08/21/2017 1016   TRIG 83 08/21/2017 1016   HDL 48 (L) 08/21/2017 1016   CHOLHDL 3.4 08/21/2017 1016   VLDL 20 07/22/2016 1004   LDLCALC 97 08/21/2017 1016   CMP Latest Ref Rng & Units 02/08/2018 08/21/2017 01/31/2017  Glucose 65 - 99 mg/dL 197(H) 220(H) 266(H)  BUN 7 - 25 mg/dL _1 Creatinine 0.60 - 0.93 mg/dL 0.84 0.82 0.74  Sodium 135 - 146 mmol/L 141 140 138  Potassium 3.5 - 5.3 mmol/L 4.3 4.5 4.2  Chloride 98 - 110 mmol/L 102 103 101  CO2 20 - 32 mmol/L _2 Calcium 8.6 - 10.4 mg/dL 9.9 10.1 9.6  Total Protein 6.1 - 8.1 g/dL - 7.0 -  Total Bilirubin 0.2 - 1.2 mg/dL - 0.3 -  Alkaline Phos 33 - 130 U/L - - -  AST 10 - 35 U/L - 22 -  ALT 6 - 29 U/L - 18 -      Preferred Learning Style:  Auditory  Visual  Hands on  Learning Readiness:   Ready  Change in progress   MEDICATIONS:   DIETARY INTAKE:    24-hr recall:  B ( AM): Water. Scrambled eggs,    fruit L (  PM): Toss salad, onions, peppers, olives with Ranch Dressing, water Snk ( PM):  D ( PM): Boiled chicken, 1 slice bread, rice, and carrots, water. Snk ( PM): Beverages: water  Usual physical activity:  Chair exercises.   Estimated energy needs: 1500  calories 170 g carbohydrates 112 g protein 42 g fat  Progress Towards Goal(s):  In progress.   Nutritional Diagnosis:  NB-1.1 Food and nutrition-related knowledge deficit As related to Diabetes.  As evidenced by A1C  8.5%.     Intervention:  Nutrition and Diabetes education provided on My Plate, CHO counting, meal planning, portion sizes, timing of meals, avoiding snacks between meals unless having a low blood sugar, target ranges for A1C and blood sugars, signs/symptoms and treatment of hyper/hypoglycemia, monitoring blood sugars, taking medications as prescribed, benefits of exercising 30 minutes per day and prevention of complications of DM.   Insulin Instruction  Patient was seen on 02-20-18 for insulin instruction.  The following learning objectives were met by the patient during this visit:  Insulin Action of Lantus nsulins  10 units a day at [facility].  Reviewed insulin pen  including # units per syringe,    length of needles, vial VS Pen cartridge and needles  Hygiene and storage  Drawing up single   Rotation of Sites  Hypoglycemia- symptoms, causes , treatment choices  Record keeping and MD follow up  Hypoglycemia, causes, symptoms and treatment   Patient demonstrated understanding of insulin administration by return demonstration.  Patient received the following handouts:  Insulin Instruction Handout  Mixing Insulin Brochure by BD Getting Started                                        Patient to start on insulin as Rx'd by MD  Patient will be seen for follow-up as needed.  .Goals 1. Follow MY Plate 2. Take 10 units of lantus daily. 3. Exercise 15 minutes a day 4. Avoid eating after 7 pm Call if questions. Get  A1C down to 7%.   Teaching Method Utilized:  Visual Auditory Hands on  Handouts given during visit include:  The Plate Method  Meal Plan Card  Barriers to learning/adherence to lifestyle change: none  Demonstrated degree of understanding via:  Teach Back   Monitoring/Evaluation:  Dietary intake, exercise, , and body weight in 1 month(s).  Will f/u in 1 month to evaluate taking insulin.

## 2018-02-20 NOTE — Patient Instructions (Signed)
Goals 1. Follow MY Plate 2. Take 10 units of lantus daily. 3. Exercise 15 minutes a day 4. Avoid eating after 7 pm Call if questions. Get A1C down to 7%.

## 2018-02-20 NOTE — Telephone Encounter (Signed)
Pt is in the office of Jearld Fenton she had some questions about Lantice and what she needed to do while she doesn't have any supplies.

## 2018-02-20 NOTE — Telephone Encounter (Signed)
Spoke with Kieth Brightly regarding patient and patient was just wanting to make sure insulin had been sent to mail order pharmacy. It has been and needles were ordered.

## 2018-03-29 ENCOUNTER — Ambulatory Visit: Payer: Medicare HMO | Admitting: Nutrition

## 2018-03-30 ENCOUNTER — Telehealth: Payer: Self-pay

## 2018-03-30 NOTE — Telephone Encounter (Signed)
Called patient to check on her diabetic test strip supplies, to see if he had them refilled. No answer, left a message.

## 2018-04-10 ENCOUNTER — Other Ambulatory Visit (HOSPITAL_COMMUNITY): Payer: Medicare HMO

## 2018-04-10 ENCOUNTER — Encounter (HOSPITAL_COMMUNITY): Payer: Medicare HMO

## 2018-04-10 ENCOUNTER — Ambulatory Visit (HOSPITAL_COMMUNITY)
Admission: RE | Admit: 2018-04-10 | Discharge: 2018-04-10 | Disposition: A | Discharge status: Home / Self Care | Payer: Medicare HMO | Source: Ambulatory Visit | Attending: Family Medicine | Admitting: Family Medicine

## 2018-04-10 ENCOUNTER — Ambulatory Visit (HOSPITAL_COMMUNITY): Admission: RE | Admit: 2018-04-10 | Payer: Medicare HMO | Source: Ambulatory Visit

## 2018-04-10 DIAGNOSIS — R928 Other abnormal and inconclusive findings on diagnostic imaging of breast: Secondary | ICD-10-CM | POA: Insufficient documentation

## 2018-04-11 DIAGNOSIS — H25813 Combined forms of age-related cataract, bilateral: Secondary | ICD-10-CM | POA: Diagnosis not present

## 2018-04-11 DIAGNOSIS — E119 Type 2 diabetes mellitus without complications: Secondary | ICD-10-CM | POA: Diagnosis not present

## 2018-04-11 DIAGNOSIS — Z794 Long term (current) use of insulin: Secondary | ICD-10-CM | POA: Diagnosis not present

## 2018-04-11 DIAGNOSIS — Z7984 Long term (current) use of oral hypoglycemic drugs: Secondary | ICD-10-CM | POA: Diagnosis not present

## 2018-04-19 DIAGNOSIS — H2511 Age-related nuclear cataract, right eye: Secondary | ICD-10-CM | POA: Diagnosis not present

## 2018-04-19 DIAGNOSIS — H401112 Primary open-angle glaucoma, right eye, moderate stage: Secondary | ICD-10-CM | POA: Diagnosis not present

## 2018-04-30 ENCOUNTER — Encounter: Payer: Medicare HMO | Attending: Family Medicine | Admitting: Nutrition

## 2018-04-30 VITALS — Ht 62.3 in | Wt 208.0 lb

## 2018-04-30 DIAGNOSIS — E1165 Type 2 diabetes mellitus with hyperglycemia: Secondary | ICD-10-CM | POA: Diagnosis not present

## 2018-04-30 DIAGNOSIS — E669 Obesity, unspecified: Secondary | ICD-10-CM | POA: Insufficient documentation

## 2018-04-30 DIAGNOSIS — E118 Type 2 diabetes mellitus with unspecified complications: Secondary | ICD-10-CM | POA: Insufficient documentation

## 2018-04-30 DIAGNOSIS — IMO0002 Reserved for concepts with insufficient information to code with codable children: Secondary | ICD-10-CM

## 2018-04-30 NOTE — Patient Instructions (Addendum)
Goals 1 Cut out honey and use fruit in oatmeal instead 2. Increase exercise 30 minutes 2-3 times per week 3. Continue to eat better food choices 4.Get A1C down to 7% Lose 2-3 lbs per months

## 2018-04-30 NOTE — Progress Notes (Signed)
Medical Nutrition Therapy:  Appt start time: 1400 end time:  1430  Assessment:  Primary concerns today: Diabetes Type 2 for > 15 yrs. Metformin 1000 mg BID and Glipizide 10 mg BID.  Cut out sodsa, Drinking water. Testing 2-3 time per day. Feeling better.  Able to walk more and not be as tired. Not craving the sweets anymore. Still doing 10 units of Lantus daily.  BS are running 115-150's. BS are much better since gong on insulin. Will get labs done in March . No low blood sugars. She has been giving insulin in legs and stomach. Advised to rotate locations in stomach for greater absorption of insulin. She notes her husband had stroke recently and they are working on eating much better and cutting out fried foods and processed foods. She is going to start going to work out with him soon. Making lifestyle changes.   Lab Results  Component Value Date   HGBA1C 8.9 (H) 02/08/2018    Wt Readings from Last 3 Encounters:  02/20/18 206 lb (93.4 kg)  02/14/18 209 lb (94.8 kg)  12/20/17 208 lb 3.2 oz (94.4 kg)   Ht Readings from Last 3 Encounters:  02/20/18 5' 2.4" (1.585 m)  02/14/18 5' 2.5" (1.588 m)  12/20/17 '5\' 2"'  (1.575 m)     Vitals with BMI 11/14/2017  Height 5' 2.5"  Weight 207 lbs 10 oz  BMI 69.62  Systolic   Diastolic   Pulse   Respirations    Lab Results  Component Value Date   HGBA1C 8.9 (H) 02/08/2018   Lipid Panel     Component Value Date/Time   CHOL 162 08/21/2017 1016   TRIG 83 08/21/2017 1016   HDL 48 (L) 08/21/2017 1016   CHOLHDL 3.4 08/21/2017 1016   VLDL 20 07/22/2016 1004   LDLCALC 97 08/21/2017 1016   CMP Latest Ref Rng & Units 02/08/2018 08/21/2017 01/31/2017  Glucose 65 - 99 mg/dL 197(H) 220(H) 266(H)  BUN 7 - 25 mg/dL '10 11 12  ' Creatinine 0.60 - 0.93 mg/dL 0.84 0.82 0.74  Sodium 135 - 146 mmol/L 141 140 138  Potassium 3.5 - 5.3 mmol/L 4.3 4.5 4.2  Chloride 98 - 110 mmol/L 102 103 101  CO2 20 - 32 mmol/L '30 27 28  ' Calcium 8.6 - 10.4 mg/dL 9.9 10.1  9.6  Total Protein 6.1 - 8.1 g/dL - 7.0 -  Total Bilirubin 0.2 - 1.2 mg/dL - 0.3 -  Alkaline Phos 33 - 130 U/L - - -  AST 10 - 35 U/L - 22 -  ALT 6 - 29 U/L - 18 -      Preferred Learning Style:  Auditory  Visual  Hands on  Learning Readiness:   Ready  Change in progress   MEDICATIONS:   DIETARY INTAKE:    24-hr recall:  B ( AM): Boiled egg, water, Oatmeal, honey and lemon,  L ( PM): Spinach salad, onions, peppers, water Snk ( PM):  D ( PM): [place] sandwich and 1/2 apple, water Snk ( PM): Beverages: water  Usual physical activity:  Chair exercises.   Estimated energy needs: 1500  calories 170 g carbohydrates 112 g protein 42 g fat  Progress Towards Goal(s):  In progress.   Nutritional Diagnosis:  NB-1.1 Food and nutrition-related knowledge deficit As related to Diabetes.  As evidenced by A1C  8.5%.     Intervention:  Nutrition and Diabetes education provided on My Plate, CHO counting, meal planning, portion sizes, timing of meals, avoiding snacks  between meals unless having a low blood sugar, target ranges for A1C and blood sugars, signs/symptoms and treatment of hyper/hypoglycemia, monitoring blood sugars, taking medications as prescribed, benefits of exercising 30 minutes per day and prevention of complications of DM.   Insulin Instruction  Patient was seen on 02-20-18 for insulin instruction.  The following learning objectives were met by the patient during this visit:   Insulin Action of Lantus nsulins  10 units a day at [place].  Reviewed insulin pen  including # units per syringe,    length of needles, vial VS Pen cartridge and needles  Hygiene and storage  Drawing up single   Rotation of Sites  Hypoglycemia- symptoms, causes , treatment choices  Record keeping and MD follow up  Hypoglycemia, causes, symptoms and treatment   Patient demonstrated understanding of insulin administration by return demonstration.  Patient received the following  handouts:  Insulin Instruction Handout  Mixing Insulin Brochure by BD Getting Started                                        Patient to start on insulin as Rx'd by MD  Patient will be seen for follow-up as needed.  .Goals 1 Cut out honey and use fruit in oatmeal instead 2. Increase exercise 30 minutes 2-3 times per week 3. Continue to eat better food choices 4.Get A1C down to 7% Lose 2-3 lbs per months  Teaching Method Utilized:  Visual Auditory Hands on  Handouts given during visit include:  The Plate Method  Meal Plan Card  Barriers to learning/adherence to lifestyle change: none  Demonstrated degree of understanding via:  Teach Back   Monitoring/Evaluation:  Dietary intake, exercise, , and body weight in 3 month(s).

## 2018-05-01 ENCOUNTER — Encounter: Payer: Self-pay | Admitting: Nutrition

## 2018-05-04 ENCOUNTER — Other Ambulatory Visit: Payer: Self-pay | Admitting: Family Medicine

## 2018-05-04 DIAGNOSIS — E118 Type 2 diabetes mellitus with unspecified complications: Principal | ICD-10-CM

## 2018-05-04 DIAGNOSIS — IMO0002 Reserved for concepts with insufficient information to code with codable children: Secondary | ICD-10-CM

## 2018-05-04 DIAGNOSIS — E1165 Type 2 diabetes mellitus with hyperglycemia: Secondary | ICD-10-CM

## 2018-05-09 DIAGNOSIS — H2511 Age-related nuclear cataract, right eye: Secondary | ICD-10-CM | POA: Diagnosis not present

## 2018-05-09 DIAGNOSIS — H401112 Primary open-angle glaucoma, right eye, moderate stage: Secondary | ICD-10-CM | POA: Diagnosis not present

## 2018-05-09 DIAGNOSIS — H25811 Combined forms of age-related cataract, right eye: Secondary | ICD-10-CM | POA: Diagnosis not present

## 2018-05-09 HISTORY — PX: EYE SURGERY: SHX253

## 2018-05-14 ENCOUNTER — Other Ambulatory Visit: Payer: Self-pay | Admitting: Family Medicine

## 2018-05-14 DIAGNOSIS — I10 Essential (primary) hypertension: Secondary | ICD-10-CM

## 2018-06-04 ENCOUNTER — Ambulatory Visit: Payer: Medicare HMO | Admitting: Family Medicine

## 2018-06-12 ENCOUNTER — Ambulatory Visit: Payer: Medicare HMO | Admitting: Nutrition

## 2018-06-12 ENCOUNTER — Telehealth: Payer: Self-pay | Admitting: Nutrition

## 2018-06-12 NOTE — Telephone Encounter (Signed)
vm x 2 to call and do Dm f/u visit via phone.

## 2018-06-14 ENCOUNTER — Other Ambulatory Visit: Payer: Self-pay | Admitting: Family Medicine

## 2018-06-14 DIAGNOSIS — K219 Gastro-esophageal reflux disease without esophagitis: Secondary | ICD-10-CM

## 2018-07-05 ENCOUNTER — Ambulatory Visit (INDEPENDENT_AMBULATORY_CARE_PROVIDER_SITE_OTHER): Payer: Medicare HMO | Admitting: Family Medicine

## 2018-07-05 ENCOUNTER — Encounter: Payer: Self-pay | Admitting: Nutrition

## 2018-07-05 ENCOUNTER — Encounter: Payer: Self-pay | Admitting: Family Medicine

## 2018-07-05 ENCOUNTER — Encounter: Payer: Medicare HMO | Attending: Family Medicine | Admitting: Nutrition

## 2018-07-05 ENCOUNTER — Other Ambulatory Visit: Payer: Self-pay

## 2018-07-05 VITALS — BP 136/80 | Ht 62.5 in | Wt 209.0 lb

## 2018-07-05 DIAGNOSIS — I10 Essential (primary) hypertension: Secondary | ICD-10-CM | POA: Diagnosis not present

## 2018-07-05 DIAGNOSIS — J45991 Cough variant asthma: Secondary | ICD-10-CM

## 2018-07-05 DIAGNOSIS — E1159 Type 2 diabetes mellitus with other circulatory complications: Secondary | ICD-10-CM

## 2018-07-05 DIAGNOSIS — E7849 Other hyperlipidemia: Secondary | ICD-10-CM

## 2018-07-05 DIAGNOSIS — J3089 Other allergic rhinitis: Secondary | ICD-10-CM | POA: Diagnosis not present

## 2018-07-05 DIAGNOSIS — E118 Type 2 diabetes mellitus with unspecified complications: Secondary | ICD-10-CM | POA: Insufficient documentation

## 2018-07-05 DIAGNOSIS — M5010 Cervical disc disorder with radiculopathy, unspecified cervical region: Secondary | ICD-10-CM | POA: Diagnosis not present

## 2018-07-05 DIAGNOSIS — E1165 Type 2 diabetes mellitus with hyperglycemia: Secondary | ICD-10-CM | POA: Insufficient documentation

## 2018-07-05 DIAGNOSIS — IMO0002 Reserved for concepts with insufficient information to code with codable children: Secondary | ICD-10-CM

## 2018-07-05 DIAGNOSIS — E669 Obesity, unspecified: Secondary | ICD-10-CM

## 2018-07-05 MED ORDER — FLUTICASONE PROPIONATE 50 MCG/ACT NA SUSP: 2.0000 | Freq: Every day | NASAL | 1 refills | Status: DC

## 2018-07-05 MED ORDER — GABAPENTIN 300 MG PO CAPS: 300.0000 mg | ORAL_CAPSULE | Freq: Every day | ORAL | 1 refills | Status: DC

## 2018-07-05 MED ORDER — ALBUTEROL SULFATE HFA 108 (90 BASE) MCG/ACT IN AERS: 2.0000 | INHALATION_SPRAY | Freq: Four times a day (QID) | RESPIRATORY_TRACT | 3 refills | Status: DC | PRN

## 2018-07-05 NOTE — Patient Instructions (Addendum)
F/U in office with meter and log book in 3 weeks  Increase lantus to 15 units daily, and commit to eating and making salads twice daily and stop juice  Fasting lipid, cmp and EGFR and hBA1C tomorrow at [facility], order is faxed over  It is important that you exercise regularly at least 30 minutes 5 times a week. If you develop chest pain, have severe difficulty breathing, or feel very tired, stop exercising immediately and seek medical attention   Think about what you will eat, plan ahead. Choose " clean, green, fresh or frozen" over canned, processed or packaged foods which are more sugary, salty and fatty. 70 to 75% of food eaten should be vegetables and fruit. Three meals at set times with snacks allowed between meals, but they must be fruit or vegetables. Aim to eat over a 12 hour period , example 7 am to 7 pm, and STOP after  your last meal of the day. Drink water,generally about 64 ounces per day, no other drink is as healthy. Fruit juice is best enjoyed in a healthy way, by EATING the fruit.  Thanks for choosing [facility], we consider it a privelige to serve you.

## 2018-07-05 NOTE — Patient Instructions (Addendum)
Goals Increase more lower carb vegetables. Increase walking as tolerated Talk to Dr Moshe Cipro about adjusting insulin if BS are 190 in am. Get A1C down to 7%

## 2018-07-05 NOTE — Progress Notes (Signed)
Medical Nutrition Therapy:  Appt start time: 1250  end time:  1310  Assessment:  Primary concerns today: Diabetes Type 2 for > 15 yrs. 10 units Lantus, Metformin 1000 mg BID and Glipizide 10 mg BID.  Cut out sodsa, Drinking water. Testing 2-3 times per day. Feeling better.  Currently remodeling house and not able to cook. Relies on her family to brin her foods.t. FBS 130-191,  300 this am. Eating later at night.Sees Dr. Simpson office. Walks some. Will get back on track with better meals once her kitchen remodel is finished and she can prepare her own meals.   Lab Results  Component Value Date   HGBA1C 8.9 (H) 02/08/2018    Wt Readings from Last 3 Encounters:  04/30/18 208 lb (94.3 kg)  02/20/18 206 lb (93.4 kg)  02/14/18 209 lb (94.8 kg)   Ht Readings from Last 3 Encounters:  04/30/18 5' 2.3" (1.582 m)  02/20/18 5' 2.4" (1.585 m)  02/14/18 5' 2.5" (1.588 m)     Vitals with BMI 11/14/2017  Height 5' 2.5"  Weight 207 lbs 10 oz  BMI 37.34  Systolic   Diastolic   Pulse   Respirations    Lab Results  Component Value Date   HGBA1C 8.9 (H) 02/08/2018   Lipid Panel     Component Value Date/Time   CHOL 162 08/21/2017 1016   TRIG 83 08/21/2017 1016   HDL 48 (L) 08/21/2017 1016   CHOLHDL 3.4 08/21/2017 1016   VLDL 20 07/22/2016 1004   LDLCALC 97 08/21/2017 1016   CMP Latest Ref Rng & Units 02/08/2018 08/21/2017 01/31/2017  Glucose 65 - 99 mg/dL 197(H) 220(H) 266(H)  BUN 7 - 25 mg/dL 10 11 12  Creatinine 0.60 - 0.93 mg/dL 0.84 0.82 0.74  Sodium 135 - 146 mmol/L 141 140 138  Potassium 3.5 - 5.3 mmol/L 4.3 4.5 4.2  Chloride 98 - 110 mmol/L 102 103 101  CO2 20 - 32 mmol/L 30 27 28  Calcium 8.6 - 10.4 mg/dL 9.9 10.1 9.6  Total Protein 6.1 - 8.1 g/dL - 7.0 -  Total Bilirubin 0.2 - 1.2 mg/dL - 0.3 -  Alkaline Phos 33 - 130 U/L - - -  AST 10 - 35 U/L - 22 -  ALT 6 - 29 U/L - 18 -      Preferred Learning Style:  Auditory  Visual  Hands on  Learning Readiness:    Ready  Change in progress   MEDICATIONS:   DIETARY INTAKE:    24-hr recall:  B ( AM): Boiled egg, water, 2 slices toast L ( PM): hamburger from [employer], water Snk ( PM):  D ( PM): [employer] burger, water nk ( PM): Beverages: water  Usual physical activity:  Chair exercises.   Estimated energy needs: 1500  calories 170 g carbohydrates 112 g protein 42 g fat  Progress Towards Goal(s):  In progress.   Nutritional Diagnosis:  NB-1.1 Food and nutrition-related knowledge deficit As related to Diabetes.  As evidenced by A1C  8.5%.     Intervention:  Nutrition and Diabetes education provided on My Plate, CHO counting, meal planning, portion sizes, timing of meals, avoiding snacks between meals unless having a low blood sugar, target ranges for A1C and blood sugars, signs/symptoms and treatment of hyper/hypoglycemia, monitoring blood sugars, taking medications as prescribed, benefits of exercising 30 minutes per day and prevention of complications of DM.   Insulin Instruction  Patient was seen on 02-20-18 for insulin instruction.  The   following learning objectives were met by the patient during this visit:   Insulin Action of Lantus nsulins  10 units a day at [place].  Reviewed insulin pen  including # units per syringe,    length of needles, vial VS Pen cartridge and needles  Hygiene and storage  Drawing up single   Rotation of Sites  Hypoglycemia- symptoms, causes , treatment choices  Record keeping and MD follow up  Hypoglycemia, causes, symptoms and treatment   Patient demonstrated understanding of insulin administration by return demonstration.  Patient received the following handouts:  Insulin Instruction Handout  Mixing Insulin Brochure by [facility] Getting Started                                        Patient to start on insulin as Rx'd by MD  Patient will be seen for follow-up as needed.  Goals Increase more lower carb vegetables. Increase walking as  tolerated Talk to Dr Simpson about adjusting insulin if BS are 190 in am. Get A1C down to 7%  Teaching Method Utilized:  Visual Auditory Hands on  Handouts given during visit include:  The Plate Method  Meal Plan Card  Barriers to learning/adherence to lifestyle change: none  Demonstrated degree of understanding via:  Teach Back   Monitoring/Evaluation:  Dietary intake, exercise, , and body weight in 3 month(s).   

## 2018-07-05 NOTE — Progress Notes (Signed)
Virtual Visit via Telephone Note  I connected with [name] on 07/05/18 at  2:40 PM EDT by telephone and verified that I am speaking with the correct person using two identifiers.  Location: Patient: home Provider: office   I discussed the limitations, risks, security and privacy concerns of performing an evaluation and management service by telephone and the availability of in person appointments. I also discussed with the patient that there may be a patient responsible charge related to this service. The patient expressed understanding and agreed to proceed.   History of Present Illness: C/o excess urination and dry mouth, and thirst x 1week, states not following diabetic diet as she eats " what her children bring in for her"  Just spoke with dietitcian who advised her to eat mainly salad, which she can fix despite the fact that she is remodeling Denies recent fever or chills. Denies sinus pressure, nasal congestion, ear pain or sore throat. Denies chest congestion, productive cough or wheezing. Denies chest pains, palpitations and leg swelling Denies abdominal pain, nausea, vomiting,diarrhea or constipation.   Denies dysuria, frequency, hesitancy or incontinence. C/o chronic  joint pain,  and limitation in mobility. Denies headaches, seizures, numbness, or tingling. Denies depression,c/o  uncontrolled anxiety and  insomnia. Denies skin break down or rash.       Observations/Objective: .BP 136/80   Ht 5' 2.5" (1.588 m)   Wt 209 lb (94.8 kg)   BMI 37.62 kg/m  Good communication with no confusion and intact memory. Alert and oriented x 3 No signs of respiratory distress during sppech   Assessment and Plan: Type 2 diabetes mellitus with vascular disease ([place]) Ms. Rayman is reminded of the importance of commitment to daily physical activity for 30 minutes or more, as able and the need to limit carbohydrate intake to 30 to 60 grams per meal to help with blood  sugar control.  Uncontrolled and deteriorated, needs to follow diet and dose of lantus is increased with 3 week f/u  The need to take medication as prescribed, test blood sugar as directed, and to call between visits if there is a concern that blood sugar is uncontrolled is also discussed.   Ms. Backs is reminded of the importance of daily foot exam, annual eye examination, and good blood sugar, blood pressure and cholesterol control.  Deteriorateda nd uncontrolled, Lantus dose is increased and she is to continue with Nutritionist  Diabetic Labs Latest Ref Rng & Units 07/06/2018 02/08/2018 08/21/2017 01/31/2017 11/01/2016  HbA1c <5.7 % of total Hgb 11.4(H) 8.9(H) 8.5(H) 9.1(H) -  Microalbumin mg/dL - 1.7 - - 9.8(H)  Micro/Creat Ratio <30 mcg/mg creat - 10 - - 5.5  Chol <200 mg/dL 189 - 162 - -  HDL > OR = 50 mg/dL 43(L) - 48(L) - -  Calc LDL mg/dL (calc) 126(H) - 97 - -  Triglycerides <150 mg/dL 98 - 83 - -  Creatinine 0.60 - 0.93 mg/dL 0.86 0.84 0.82 0.74 -   BP/Weight 07/05/2018 04/30/2018 02/20/2018 02/14/2018 12/20/2017 11/14/2017 06/03/5186  Systolic BP 416 - - 606 301 - 601  Diastolic BP 80 - - 80 88 - 78  Wt. (Lbs) 209 208 206 209 208.2 207.6 209  BMI 37.62 37.68 37.2 37.62 38.08 37.37 38.23   Foot/eye exam completion dates Latest Ref Rng & Units 02/14/2018 02/01/2017  Eye Exam No Retinopathy - Retinopathy(A)  Foot exam Order - - -  Foot Form Completion - Done -        Essential  hypertension Controlled, no change in medication DASH diet and commitment to daily physical activity for a minimum of 30 minutes discussed and encouraged, as a part of hypertension management. The importance of attaining a healthy weight is also discussed.  BP/Weight 07/05/2018 04/30/2018 02/20/2018 02/14/2018 12/20/2017 11/14/2017 0/16/0109  Systolic BP 323 - - 557 322 - 025  Diastolic BP 80 - - 80 88 - 78  Wt. (Lbs) 209 208 206 209 208.2 207.6 209  BMI 37.62 37.68 37.2 37.62 38.08 37.37 38.23        Morbid obesity ([facility]) Obesity associated with hypertension and diabetes  Deteriorated.  Patient re-educated about  the importance of commitment to a  minimum of 150 minutes of exercise per week as able.  The importance of healthy food choices with portion control discussed, as well as eating regularly and within a 12 hour window most days. The need to choose "clean , green" food 50 to 75% of the time is discussed, as well as to make water the primary drink and set a goal of 64 ounces water daily.  Encouraged to start a food diary,  and to consider  joining a support group. Sample diet sheets offered. Goals set by the patient for the next several months.   Weight /BMI 07/05/2018 04/30/2018 02/20/2018  WEIGHT 209 lb 208 lb 206 lb  HEIGHT 5' 2.5" 5' 2.3" 5' 2.4"  BMI 37.62 kg/m2 37.68 kg/m2 37.2 kg/m2         COUGH VARIANT ASTHMA Controlled, no change in medication   Hyperlipidemia Hyperlipidemia:Low fat diet discussed and encouraged.   Lipid Panel  Lab Results  Component Value Date   CHOL 189 07/06/2018   HDL 43 (L) 07/06/2018   LDLCALC 126 (H) 07/06/2018   TRIG 98 07/06/2018   CHOLHDL 4.4 07/06/2018   Uncontrolled , needs to reduce fried and fatty foods, and needs increase in med dose once verified at next visit    Allergic rhinitis Controlled, no change in medication     Follow Up Instructions:    I discussed the assessment and treatment plan with the patient. The patient was provided an opportunity to ask questions and all were answered. The patient agreed with the plan and demonstrated an understanding of the instructions.   The patient was advised to call back or seek an in-person evaluation if the symptoms worsen or if the condition fails to improve as anticipated.  I provided 25 minutes of non-face-to-face time during this encounter.   Tula Nakayama, MD

## 2018-07-06 DIAGNOSIS — E1159 Type 2 diabetes mellitus with other circulatory complications: Secondary | ICD-10-CM | POA: Diagnosis not present

## 2018-07-07 LAB — COMPLETE METABOLIC PANEL WITH GFR
AG Ratio: 1.3 (calc) (ref 1.0–2.5)
ALT: 20 U/L (ref 6–29)
AST: 22 U/L (ref 10–35)
Albumin: 3.9 g/dL (ref 3.6–5.1)
Alkaline phosphatase (APISO): 93 U/L (ref 37–153)
BUN: 12 mg/dL (ref 7–25)
CO2: 28 mmol/L (ref 20–32)
Calcium: 9.4 mg/dL (ref 8.6–10.4)
Chloride: 101 mmol/L (ref 98–110)
Creat: 0.86 mg/dL (ref 0.60–0.93)
GFR, Est African American: 78 mL/min/{1.73_m2} (ref >=60)
GFR, Est Non African American: 67 mL/min/{1.73_m2} (ref >=60)
Globulin: 2.9 g/dL (calc) (ref 1.9–3.7)
Glucose, Bld: 327 mg/dL — ABNORMAL HIGH (ref 65–99)
Potassium: 4.5 mmol/L (ref 3.5–5.3)
Sodium: 136 mmol/L (ref 135–146)
Total Bilirubin: 0.4 mg/dL (ref 0.2–1.2)
Total Protein: 6.8 g/dL (ref 6.1–8.1)

## 2018-07-07 LAB — HEMOGLOBIN A1C
Hgb A1c MFr Bld: 11.4 % of total Hgb — ABNORMAL HIGH (ref <5.7)
Mean Plasma Glucose: 280 (calc)
eAG (mmol/L): 15.5 (calc)

## 2018-07-07 LAB — LIPID PANEL
Cholesterol: 189 mg/dL (ref <200)
HDL: 43 mg/dL — ABNORMAL LOW (ref >=50)
LDL Cholesterol (Calc): 126 mg/dL (calc) — ABNORMAL HIGH
Non-HDL Cholesterol (Calc): 146 mg/dL (calc) — ABNORMAL HIGH (ref <130)
Total CHOL/HDL Ratio: 4.4 (calc) (ref <5.0)
Triglycerides: 98 mg/dL (ref <150)

## 2018-07-09 ENCOUNTER — Encounter: Payer: Self-pay | Admitting: Family Medicine

## 2018-07-09 NOTE — Assessment & Plan Note (Addendum)
Hyperlipidemia:Low fat diet discussed and encouraged.   Lipid Panel  Lab Results  Component Value Date   CHOL 189 07/06/2018   HDL 43 (L) 07/06/2018   LDLCALC 126 (H) 07/06/2018   TRIG 98 07/06/2018   CHOLHDL 4.4 07/06/2018   Uncontrolled , needs to reduce fried and fatty foods, and needs increase in med dose once verified at next visit

## 2018-07-09 NOTE — Assessment & Plan Note (Signed)
Controlled, no change in medication  

## 2018-07-09 NOTE — Assessment & Plan Note (Signed)
Controlled, no change in medication DASH diet and commitment to daily physical activity for a minimum of 30 minutes discussed and encouraged, as a part of hypertension management. The importance of attaining a healthy weight is also discussed.  BP/Weight 07/05/2018 04/30/2018 02/20/2018 02/14/2018 12/20/2017 11/14/2017 03/29/4495  Systolic BP 530 - - 051 102 - 111  Diastolic BP 80 - - 80 88 - 78  Wt. (Lbs) 209 208 206 209 208.2 207.6 209  BMI 37.62 37.68 37.2 37.62 38.08 37.37 38.23

## 2018-07-09 NOTE — Assessment & Plan Note (Addendum)
Tiffany Jacobs is reminded of the importance of commitment to daily physical activity for 30 minutes or more, as able and the need to limit carbohydrate intake to 30 to 60 grams per meal to help with blood sugar control.  Uncontrolled and deteriorated, needs to follow diet and dose of lantus is increased with 3 week f/u  The need to take medication as prescribed, test blood sugar as directed, and to call between visits if there is a concern that blood sugar is uncontrolled is also discussed.   Tiffany Jacobs is reminded of the importance of daily foot exam, annual eye examination, and good blood sugar, blood pressure and cholesterol control.  Deteriorateda nd uncontrolled, Lantus dose is increased and she is to continue with Nutritionist  Diabetic Labs Latest Ref Rng & Units 07/06/2018 02/08/2018 08/21/2017 01/31/2017 11/01/2016  HbA1c <5.7 % of total Hgb 11.4(H) 8.9(H) 8.5(H) 9.1(H) -  Microalbumin mg/dL - 1.7 - - 9.8(H)  Micro/Creat Ratio <30 mcg/mg creat - 10 - - 5.5  Chol <200 mg/dL 189 - 162 - -  HDL > OR = 50 mg/dL 43(L) - 48(L) - -  Calc LDL mg/dL (calc) 126(H) - 97 - -  Triglycerides <150 mg/dL 98 - 83 - -  Creatinine 0.60 - 0.93 mg/dL 0.86 0.84 0.82 0.74 -   BP/Weight 07/05/2018 04/30/2018 02/20/2018 02/14/2018 12/20/2017 11/14/2017 9/48/0165  Systolic BP 537 - - 482 707 - 867  Diastolic BP 80 - - 80 88 - 78  Wt. (Lbs) 209 208 206 209 208.2 207.6 209  BMI 37.62 37.68 37.2 37.62 38.08 37.37 38.23   Foot/eye exam completion dates Latest Ref Rng & Units 02/14/2018 02/01/2017  Eye Exam No Retinopathy - Retinopathy(A)  Foot exam Order - - -  Foot Form Completion - Done -

## 2018-07-09 NOTE — Assessment & Plan Note (Signed)
Obesity associated with hypertension and diabetes  Deteriorated.  Patient re-educated about  the importance of commitment to a  minimum of 150 minutes of exercise per week as able.  The importance of healthy food choices with portion control discussed, as well as eating regularly and within a 12 hour window most days. The need to choose "clean , green" food 50 to 75% of the time is discussed, as well as to make water the primary drink and set a goal of 64 ounces water daily.  Encouraged to start a food diary,  and to consider  joining a support group. Sample diet sheets offered. Goals set by the patient for the next several months.   Weight /BMI 07/05/2018 04/30/2018 02/20/2018  WEIGHT 209 lb 208 lb 206 lb  HEIGHT 5' 2.5" 5' 2.3" 5' 2.4"  BMI 37.62 kg/m2 37.68 kg/m2 37.2 kg/m2

## 2018-07-16 ENCOUNTER — Encounter: Payer: Self-pay | Admitting: Family Medicine

## 2018-07-16 ENCOUNTER — Telehealth: Payer: Self-pay | Admitting: Family Medicine

## 2018-07-16 ENCOUNTER — Ambulatory Visit (INDEPENDENT_AMBULATORY_CARE_PROVIDER_SITE_OTHER): Payer: Medicare HMO | Admitting: Family Medicine

## 2018-07-16 DIAGNOSIS — N3000 Acute cystitis without hematuria: Secondary | ICD-10-CM | POA: Diagnosis not present

## 2018-07-16 DIAGNOSIS — E1165 Type 2 diabetes mellitus with hyperglycemia: Secondary | ICD-10-CM | POA: Diagnosis not present

## 2018-07-16 MED ORDER — FLUCONAZOLE 150 MG PO TABS: 150.0000 mg | ORAL_TABLET | Freq: Once | ORAL | 0 refills | Status: AC

## 2018-07-16 MED ORDER — CEPHALEXIN 500 MG PO CAPS: 500.0000 mg | ORAL_CAPSULE | Freq: Four times a day (QID) | ORAL | 0 refills | Status: DC

## 2018-07-16 NOTE — Progress Notes (Signed)
5 day h/o lower UTI  Symptoms, no fever , chills , flank pain, nausea or vomit, will treat empirically with keflex x 5 days and fluconazole 1 tablet a;llso prescribed. C/o increased and uncontrolled blood sugar in the last 5 days over 200 when it had been under 100 Will call in next 2 to 3 days if no improvement

## 2018-07-16 NOTE — Telephone Encounter (Signed)
Addressed vi tele visit today

## 2018-07-16 NOTE — Assessment & Plan Note (Signed)
Reports deterioration in past 5 days with uTI with FBG over 200 when prior to this states it was in the low 100's Recommend increased water intake and attention to food choice , will improve with treatment  of uTI

## 2018-07-16 NOTE — Telephone Encounter (Signed)
Pt is calling she has a UTI, it started on Thursday may 7th, she has urgency, and pain in lower abdomen, and not making alot of water when she goes.  Schedule is full today, however I advised pt we may need her to do a urine test at the lab. And that I would call her back to advise

## 2018-07-16 NOTE — Patient Instructions (Addendum)
F/U as before, call if you need me sooner  You are treated for a  Urinary tract infection, 5 days of antibiotic are prescribed.  Please drink water often and empty your bladder often, this will help in clearing your infection Fluconazole for yeasty infection is prescribed for if needed use at the end of the course   Urinary Tract Infection, Adult A urinary tract infection (UTI) is an infection of any part of the urinary tract. The urinary tract includes:  The kidneys.  The ureters.  The bladder.  The urethra. These organs make, store, and get rid of pee (urine) in the body. What are the causes? This is caused by germs (bacteria) in your genital area. These germs grow and cause swelling (inflammation) of your urinary tract. What increases the risk? You are more likely to develop this condition if:  You have a small, thin tube (catheter) to drain pee.  You cannot control when you pee or poop (incontinence).  You are female, and: ? You use these methods to prevent pregnancy: ? A medicine that kills sperm (spermicide). ? A device that blocks sperm (diaphragm). ? You have low levels of a female hormone (estrogen). ? You are pregnant.  You have genes that add to your risk.  You are sexually active.  You take antibiotic medicines.  You have trouble peeing because of: ? A prostate that is bigger than normal, if you are female. ? A blockage in the part of your body that drains pee from the bladder (urethra). ? A kidney stone. ? A nerve condition that affects your bladder (neurogenic bladder). ? Not getting enough to drink. ? Not peeing often enough.  You have other conditions, such as: ? Diabetes. ? A weak disease-fighting system (immune system). ? Sickle cell disease. ? Gout. ? Injury of the spine. What are the signs or symptoms? Symptoms of this condition include:  Needing to pee right away (urgently).  Peeing often.  Peeing small amounts often.  Pain or  burning when peeing.  Blood in the pee.  Pee that smells bad or not like normal.  Trouble peeing.  Pee that is cloudy.  Fluid coming from the vagina, if you are female.  Pain in the belly or lower back. Other symptoms include:  Throwing up (vomiting).  No urge to eat.  Feeling mixed up (confused).  Being tired and grouchy (irritable).  A fever.  Watery poop (diarrhea). How is this treated? This condition may be treated with:  Antibiotic medicine.  Other medicines.  Drinking enough water. Follow these instructions at home:  Medicines  Take over-the-counter and prescription medicines only as told by your doctor.  If you were prescribed an antibiotic medicine, take it as told by your doctor. Do not stop taking it even if you start to feel better. General instructions  Make sure you: ? Pee until your bladder is empty. ? Do not hold pee for a long time. ? Empty your bladder after sex. ? Wipe from front to back after pooping if you are a female. Use each tissue one time when you wipe.  Drink enough fluid to keep your pee pale yellow.  Keep all follow-up visits as told by your doctor. This is important. Contact a doctor if:  You do not get better after 1-2 days.  Your symptoms go away and then come back. Get help right away if:  You have very bad back pain.  You have very bad pain in your lower belly.  You have a fever.  You are sick to your stomach (nauseous).  You are throwing up. Summary  A urinary tract infection (UTI) is an infection of any part of the urinary tract.  This condition is caused by germs in your genital area.  There are many risk factors for a UTI. These include having a small, thin tube to drain pee and not being able to control when you pee or poop.  Treatment includes antibiotic medicines for germs.  Drink enough fluid to keep your pee pale yellow. This information is not intended to replace advice given to you by your  health care provider. Make sure you discuss any questions you have with your health care provider. Document Released: 08/10/2007 Document Revised: 08/31/2017 Document Reviewed: 08/31/2017 Elsevier Interactive Patient Education  2019 [employer].

## 2018-07-16 NOTE — Telephone Encounter (Signed)
Tiffany Jacobs states schedule is full and she wants to know if you want to order a urinalysis and have her do a phone visit tomorrow?

## 2018-07-16 NOTE — Assessment & Plan Note (Signed)
5 day history, antibiotic and fluconazole prescribed, advice given and mailed

## 2018-07-20 ENCOUNTER — Encounter: Payer: Self-pay | Admitting: *Deleted

## 2018-07-21 IMAGING — NM NM MYOCAR MULTI W/SPECT W/WALL MOTION & EF
2 series · 12 of 12 positions shown · non-contrast
Comparison: none

[Series 1: rest · 6.51mm/px · 6 of 64 frames shown]
[frame 6/64]
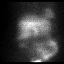
[frame 16/64]
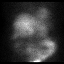
[frame 27/64]
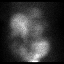
[frame 38/64]
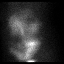
[frame 48/64]
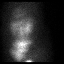
[frame 59/64]
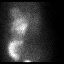

[Series 3: stress gated - perfusion · 6.51mm/px · 6 of 64 frames shown]
[frame 6/64]
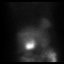
[frame 16/64]
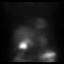
[frame 27/64]
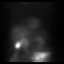
[frame 38/64]
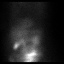
[frame 48/64]
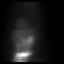
[frame 59/64]
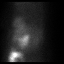

[12 of 12 positions shown; findings below may reference images not displayed]

Canned report from images found in remote index.

Refer to host system for actual result text.

## 2018-07-26 ENCOUNTER — Ambulatory Visit (INDEPENDENT_AMBULATORY_CARE_PROVIDER_SITE_OTHER): Payer: Medicare HMO | Admitting: Family Medicine

## 2018-07-26 ENCOUNTER — Encounter: Payer: Self-pay | Admitting: Family Medicine

## 2018-07-26 VITALS — BP 130/84 | HR 72 | Ht 62.5 in | Wt 209.0 lb

## 2018-07-26 DIAGNOSIS — I10 Essential (primary) hypertension: Secondary | ICD-10-CM

## 2018-07-26 DIAGNOSIS — E1159 Type 2 diabetes mellitus with other circulatory complications: Secondary | ICD-10-CM | POA: Diagnosis not present

## 2018-07-26 DIAGNOSIS — E785 Hyperlipidemia, unspecified: Secondary | ICD-10-CM | POA: Diagnosis not present

## 2018-07-26 DIAGNOSIS — Z7189 Other specified counseling: Secondary | ICD-10-CM | POA: Diagnosis not present

## 2018-07-26 MED ORDER — HYDRALAZINE HCL 25 MG PO TABS: 25.0000 mg | ORAL_TABLET | Freq: Three times a day (TID) | ORAL | 1 refills | Status: DC

## 2018-07-26 MED ORDER — ATORVASTATIN CALCIUM 20 MG PO TABS: 20.0000 mg | ORAL_TABLET | Freq: Every day | ORAL | 3 refills | Status: DC

## 2018-07-26 NOTE — Patient Instructions (Addendum)
Annual physical exam last week in  Septemeber  call if you need me before  You are referred to dr. Dorris Fetch since  your blood sugar remains uncontrolled, despite our best efforts, his office will call you  It is VERY important that you maintain contact with and follow the eating plan that you discuss with the diabetic educator to get your blood sugar controlled   Fasting lipid, cmp and eGFR 1 week before next visit Increase lantus to 20 units daily  Your cholesterol is too high , and I have changed you from paravachol to atorvastatin, please also reduce fried and fatty foods  Test and record two times daily, before breakfast and last thing at night before going to sleep  Goal for fasting blood sugar ranges from 80 to 120 and 2 hours after any meal or at bedtime should be between 130 to 170.  It is important that you exercise regularly at least 30 minutes 5 times a week. If you develop chest pain, have severe difficulty breathing, or feel very tired, stop exercising immediately and seek medical attention    Social distancing. Frequent hand washing with soap and water Keeping your hands off of your face. These 3 practices will help to keep both you and your community healthy during this time. Please practice them faithfully!   Thanks for choosing [facility], we consider it a privelige to serve you.

## 2018-07-26 NOTE — Progress Notes (Signed)
Virtual Visit via Telephone Note  I connected with Tiffany Jacobs on 07/26/18 at 10:40 AM EDT by telephone and verified that I am speaking with the correct person using two identifiers.  Location: Patient: home  Provider: office   I discussed the limitations, risks, security and privacy concerns of performing an evaluation and management service by telephone and the availability of in person appointments. I also discussed with the patient that there may be a patient responsible charge related to this service. The patient expressed understanding and agreed to proceed. This visit type is conducted due to national recommendations for restrictions regarding the COVID -19 Pandemic. Due to the patient's age and / or co morbidities, this format is felt to be most appropriate at this time without adequate follow up. The patient has no access to video technology/ had technical difficulties with video, requiring transitioning to audio format  only ( telephone ). All issues noted this document were discussed and addressed,no physical exam can be performed in this format.   History of Present Illness: F/u chronic illness and review of recent labs. Uncontrolled blood sugar persisting and worsening, needs Endo, and now agrees Blood sugar remains elevated  Which she blames on diet and food availability Denies recent fever or chills. Denies sinus pressure, nasal congestion, ear pain or sore throat. Denies chest congestion, productive cough or wheezing. Denies chest pains, palpitations and leg swelling Denies abdominal pain, nausea, vomiting,diarrhea or constipation.   Denies dysuria, frequency, hesitancy or incontinence. Denies uncontrolled  joint pain, swelling and limitation in mobility. Denies headaches, seizures, numbness, or tingling. Denies depression, anxiety takes medication for  insomnia. Denies skin break down or rash.       Observations/Objective: BP 130/84   Pulse 72   Ht 5'  2.5" (1.588 m)   Wt 209 lb (94.8 kg)   BMI 37.62 kg/m   Good communication with no confusion and intact memory. Alert and oriented x 3 No signs of respiratory distress during sppech    Assessment and Plan:  Essential hypertension Controlled, no change in medication DASH diet and commitment to daily physical activity for a minimum of 30 minutes discussed and encouraged, as a part of hypertension management. The importance of attaining a healthy weight is also discussed.  BP/Weight 07/26/2018 07/16/2018 07/05/2018 04/30/2018 02/20/2018 02/14/2018 57/32/2025  Systolic BP 427 062 376 - - 283 151  Diastolic BP 84 80 80 - - 80 88  Wt. (Lbs) 209 209 209 208 206 209 208.2  BMI 37.62 37.62 37.62 37.68 37.2 37.62 38.08       Hyperlipidemia Hyperlipidemia:Low fat diet discussed and encouraged.   Lipid Panel  Lab Results  Component Value Date   CHOL 189 07/06/2018   HDL 43 (L) 07/06/2018   LDLCALC 126 (H) 07/06/2018   TRIG 98 07/06/2018   CHOLHDL 4.4 07/06/2018  Uncontrolled, medication changed, also needs to follow low fat diet     Type 2 diabetes mellitus with vascular disease ([place]) Continues to deteriorate, now in agreement of treatment by Endo which is goiod Tiffany Jacobs is reminded of the importance of commitment to daily physical activity for 30 minutes or more, as able and the need to limit carbohydrate intake to 30 to 60 grams per meal to help with blood sugar control.   The need to take medication as prescribed, test blood sugar as directed, and to call between visits if there is a concern that blood sugar is uncontrolled is also discussed.   Tiffany Jacobs is  reminded of the importance of daily foot exam, annual eye examination, and good blood sugar, blood pressure and cholesterol control. Increase lantus to 20 units, needs to reduce carb and sugar intake  Diabetic Labs Latest Ref Rng & Units 07/06/2018 02/08/2018 08/21/2017 01/31/2017 11/01/2016  HbA1c <5.7 % of total Hgb  11.4(H) 8.9(H) 8.5(H) 9.1(H) -  Microalbumin mg/dL - 1.7 - - 9.8(H)  Micro/Creat Ratio <30 mcg/mg creat - 10 - - 5.5  Chol <200 mg/dL 189 - 162 - -  HDL > OR = 50 mg/dL 43(L) - 48(L) - -  Calc LDL mg/dL (calc) 126(H) - 97 - -  Triglycerides <150 mg/dL 98 - 83 - -  Creatinine 0.60 - 0.93 mg/dL 0.86 0.84 0.82 0.74 -   BP/Weight 07/26/2018 07/16/2018 07/05/2018 04/30/2018 02/20/2018 02/14/2018 59/74/1638  Systolic BP 453 646 803 - - 212 248  Diastolic BP 84 80 80 - - 80 88  Wt. (Lbs) 209 209 209 208 206 209 208.2  BMI 37.62 37.62 37.62 37.68 37.2 37.62 38.08   Foot/eye exam completion dates Latest Ref Rng & Units 02/14/2018 02/01/2017  Eye Exam No Retinopathy - Retinopathy(A)  Foot exam Order - - -  Foot Form Completion - Done -        Morbid obesity ([place]) Obesity linked with hypertension and diabetes Unchanged Patient re-educated about  the importance of commitment to a  minimum of 150 minutes of exercise per week as able.  The importance of healthy food choices with portion control discussed, as well as eating regularly and within a 12 hour window most days. The need to choose "clean , green" food 50 to 75% of the time is discussed, as well as to make water the primary drink and set a goal of 64 ounces water daily.     Weight /BMI 07/26/2018 07/16/2018 07/05/2018  WEIGHT 209 lb 209 lb 209 lb  HEIGHT 5' 2.5" 5' 2.5" 5' 2.5"  BMI 37.62 kg/m2 37.62 kg/m2 37.62 kg/m2      Educated About Covid-19 Virus Infection Covid-19 Education  The signs and symptoms of of COVID -19 were discussed with the patient and how to seek care for testing. ( follow up with PCP or arrange  E-visit) The importance of social  distancing is discussed today.    Follow Up Instructions:    I discussed the assessment and treatment plan with the patient. The patient was provided an opportunity to ask questions and all were answered. The patient agreed with the plan and demonstrated an understanding of the  instructions.   The patient was advised to call back or seek an in-person evaluation if the symptoms worsen or if the condition fails to improve as anticipated.  I provided 25  minutes of non-face-to-face time during this encounter.   Tula Nakayama, MD

## 2018-07-30 ENCOUNTER — Encounter: Payer: Self-pay | Admitting: Family Medicine

## 2018-07-30 DIAGNOSIS — Z7189 Other specified counseling: Secondary | ICD-10-CM | POA: Insufficient documentation

## 2018-07-30 NOTE — Assessment & Plan Note (Signed)
Continues to deteriorate, now in agreement of treatment by Endo which is goiod Tiffany Jacobs is reminded of the importance of commitment to daily physical activity for 30 minutes or more, as able and the need to limit carbohydrate intake to 30 to 60 grams per meal to help with blood sugar control.   The need to take medication as prescribed, test blood sugar as directed, and to call between visits if there is a concern that blood sugar is uncontrolled is also discussed.   Tiffany Jacobs is reminded of the importance of daily foot exam, annual eye examination, and good blood sugar, blood pressure and cholesterol control. Increase lantus to 20 units, needs to reduce carb and sugar intake  Diabetic Labs Latest Ref Rng & Units 07/06/2018 02/08/2018 08/21/2017 01/31/2017 11/01/2016  HbA1c <5.7 % of total Hgb 11.4(H) 8.9(H) 8.5(H) 9.1(H) -  Microalbumin mg/dL - 1.7 - - 9.8(H)  Micro/Creat Ratio <30 mcg/mg creat - 10 - - 5.5  Chol <200 mg/dL 189 - 162 - -  HDL > OR = 50 mg/dL 43(L) - 48(L) - -  Calc LDL mg/dL (calc) 126(H) - 97 - -  Triglycerides <150 mg/dL 98 - 83 - -  Creatinine 0.60 - 0.93 mg/dL 0.86 0.84 0.82 0.74 -   BP/Weight 07/26/2018 07/16/2018 07/05/2018 04/30/2018 02/20/2018 02/14/2018 53/74/8270  Systolic BP 786 754 492 - - 010 071  Diastolic BP 84 80 80 - - 80 88  Wt. (Lbs) 209 209 209 208 206 209 208.2  BMI 37.62 37.62 37.62 37.68 37.2 37.62 38.08   Foot/eye exam completion dates Latest Ref Rng & Units 02/14/2018 02/01/2017  Eye Exam No Retinopathy - Retinopathy(A)  Foot exam Order - - -  Foot Form Completion - Done -

## 2018-07-30 NOTE — Assessment & Plan Note (Signed)
Controlled, no change in medication DASH diet and commitment to daily physical activity for a minimum of 30 minutes discussed and encouraged, as a part of hypertension management. The importance of attaining a healthy weight is also discussed.  BP/Weight 07/26/2018 07/16/2018 07/05/2018 04/30/2018 02/20/2018 02/14/2018 97/84/7841  Systolic BP 282 081 388 - - 719 597  Diastolic BP 84 80 80 - - 80 88  Wt. (Lbs) 209 209 209 208 206 209 208.2  BMI 37.62 37.62 37.62 37.68 37.2 37.62 38.08

## 2018-07-30 NOTE — Assessment & Plan Note (Signed)
Hyperlipidemia:Low fat diet discussed and encouraged.   Lipid Panel  Lab Results  Component Value Date   CHOL 189 07/06/2018   HDL 43 (L) 07/06/2018   LDLCALC 126 (H) 07/06/2018   TRIG 98 07/06/2018   CHOLHDL 4.4 07/06/2018  Uncontrolled, medication changed, also needs to follow low fat diet

## 2018-07-30 NOTE — Assessment & Plan Note (Signed)
Covid-19 Education  The signs and symptoms of of COVID -19 were discussed with the patient and how to seek care for testing. ( follow up with PCP or arrange  E-visit) The importance of social  distancing is discussed today.  

## 2018-07-30 NOTE — Assessment & Plan Note (Signed)
Obesity linked with hypertension and diabetes Unchanged Patient re-educated about  the importance of commitment to a  minimum of 150 minutes of exercise per week as able.  The importance of healthy food choices with portion control discussed, as well as eating regularly and within a 12 hour window most days. The need to choose "clean , green" food 50 to 75% of the time is discussed, as well as to make water the primary drink and set a goal of 64 ounces water daily.     Weight /BMI 07/26/2018 07/16/2018 07/05/2018  WEIGHT 209 lb 209 lb 209 lb  HEIGHT 5' 2.5" 5' 2.5" 5' 2.5"  BMI 37.62 kg/m2 37.62 kg/m2 37.62 kg/m2

## 2018-07-31 ENCOUNTER — Ambulatory Visit: Payer: Self-pay | Admitting: "Endocrinology

## 2018-08-01 NOTE — Progress Notes (Signed)
Tiffany Jacobs, CMA  

## 2018-08-14 ENCOUNTER — Other Ambulatory Visit: Payer: Self-pay | Admitting: Family Medicine

## 2018-08-14 DIAGNOSIS — J45991 Cough variant asthma: Secondary | ICD-10-CM

## 2018-08-28 ENCOUNTER — Encounter: Payer: Self-pay | Admitting: "Endocrinology

## 2018-08-28 ENCOUNTER — Other Ambulatory Visit: Payer: Self-pay

## 2018-08-28 ENCOUNTER — Ambulatory Visit (INDEPENDENT_AMBULATORY_CARE_PROVIDER_SITE_OTHER): Payer: Medicare HMO | Admitting: "Endocrinology

## 2018-08-28 VITALS — BP 112/70 | HR 77 | Ht 62.5 in | Wt 201.4 lb

## 2018-08-28 DIAGNOSIS — E782 Mixed hyperlipidemia: Secondary | ICD-10-CM

## 2018-08-28 DIAGNOSIS — E1159 Type 2 diabetes mellitus with other circulatory complications: Secondary | ICD-10-CM | POA: Diagnosis not present

## 2018-08-28 DIAGNOSIS — I10 Essential (primary) hypertension: Secondary | ICD-10-CM | POA: Diagnosis not present

## 2018-08-28 MED ORDER — INSULIN GLARGINE 100 UNIT/ML SOLOSTAR PEN: 40.0000 [IU] | PEN_INJECTOR | Freq: Every day | SUBCUTANEOUS | 2 refills | Status: DC

## 2018-08-28 NOTE — Progress Notes (Signed)
Endocrinology Consult Note       08/28/2018, 6:09 PM   Subjective:    Patient ID: Tiffany Jacobs, female    DOB: 08-Sep-1945.  [name] is being seen in consultation for management of currently uncontrolled symptomatic diabetes requested by  Fayrene Helper, MD.   Past Medical History:  Diagnosis Date  . Arthritis   . Asthma 1963  . Diabetes mellitus, type 2 ([place]) 2004  . GERD (gastroesophageal reflux disease) 2004  . Hypertension 2004  . Low back pain   . Obesity   . RAD (reactive airway disease)   . Sciatica   . Seasonal allergies     Past Surgical History:  Procedure Laterality Date  . ACROMIO-CLAVICULAR JOINT REPAIR Right 11/09/2012   Procedure: ACROMIO-CLAVICULAR JOINT REPAIR;  Surgeon: Carole Civil, MD;  Location: [facility];  Service: Orthopedics;  Laterality: Right;  . COLONOSCOPY N/A 10/10/2016   Procedure: COLONOSCOPY;  Surgeon: Danie Binder, MD;  Location[facility];  Service: Endoscopy;  Laterality: N/A;  10:30  . EYE SURGERY Right 05/09/2018   cataract  . left breast biopsy for benign disease    . SHOULDER ACROMIOPLASTY Right 11/09/2012   Procedure: RIGHT SHOULDER ACROMIOPLASTY;  Surgeon: Carole Civil, MD;  Location: [facility];  Service: Orthopedics;  Laterality: Right;  . SHOULDER OPEN ROTATOR CUFF REPAIR Right 11/09/2012   Procedure: ROTATOR CUFF REPAIR Right SHOULDER OPEN;  Surgeon: Carole Civil, MD;  Location: [facility];  Service: Orthopedics;  Laterality: Right;  . total knee arthroplasty left  02-01-05   Dr. Aline Brochure  . TUBAL LIGATION  1970    Social History   Socioeconomic History  . Marital status: Married    Spouse name: Jeneen Rinks   . Number of children: 7  . Years of education: 8  . Highest education level: GED or equivalent  Occupational History  . Occupation: disabled   Social Needs  . Financial resource strain: Somewhat hard  . Food  insecurity    Worry: Sometimes true    Inability: Sometimes true  . Transportation needs    Medical: No    Non-medical: No  Tobacco Use  . Smoking status: Former Smoker    Packs/day: 0.50    Years: 37.00    Pack years: 18.50    Types: Cigarettes    Quit date: 10/12/1988    Years since quitting: 29.8  . Smokeless tobacco: Never Used  Substance and Sexual Activity  . Alcohol use: No  . Drug use: No  . Sexual activity: Not Currently    Birth control/protection: Post-menopausal  Lifestyle  . Physical activity    Days per week: 7 days    Minutes per session: 20 min  . Stress: Not at all  Relationships  . Social connections    Talks on phone: More than three times a week    Gets together: Once a week    Attends religious service: 1 to 4 times per year    Active member of club or organization: No    Attends meetings of clubs or organizations: Never    Relationship status: Married  Other Topics Concern  . Not on file  Social History Narrative  . Not on file    Family History  Problem Relation Age of Onset  . Kidney failure Father   . Kidney disease Father   . Stroke Brother 86       used drugs , stroke and heart disease  . Diabetes Brother   . Hypertension Brother   . Cancer Daughter 44       breast   . Diabetes Mother   . Asthma Other   . Lung disease Other   . Cancer Other   . Heart disease Other   . Arthritis Other   . Colon cancer Neg Hx   . Colon polyps Neg Hx     Outpatient Encounter Medications as of 08/28/2018  Medication Sig  . albuterol (PROVENTIL) (2.5 MG/3ML) 0.083% nebulizer solution Take 2.5 mg by nebulization every 6 (six) hours as needed for wheezing or shortness of breath.  Marland Kitchen albuterol (VENTOLIN HFA) 108 (90 Base) MCG/ACT inhaler Inhale 2 puffs into the lungs every 6 (six) hours as needed for wheezing or shortness of breath.  Marland Kitchen amLODipine (NORVASC) 10 MG tablet Take 1 tablet (10 mg total) by mouth daily.  Marland Kitchen atorvastatin (LIPITOR) 20 MG tablet Take  1 tablet (20 mg total) by mouth daily. Discontinue pravastatin effective 07/26/2018  . blood glucose meter kit and supplies Two times daily testing (dispense meter based on insurance preference) dx e11.65  . fluticasone (FLONASE) 50 MCG/ACT nasal spray Place 2 sprays into both nostrils daily.  Marland Kitchen gabapentin (NEURONTIN) 300 MG capsule Take 1 capsule (300 mg total) by mouth at bedtime.  Marland Kitchen glipiZIDE (GLUCOTROL XL) 10 MG 24 hr tablet TAKE 2 TABLETS EVERY DAY WITH BREAKFAST  . glucose blood test strip Use as instructed once daily dx E11.9  . hydrALAZINE (APRESOLINE) 25 MG tablet Take 1 tablet (25 mg total) by mouth 3 (three) times daily.  . Insulin Glargine (LANTUS) 100 UNIT/ML Solostar Pen Inject 40 Units into the skin at bedtime.  . Insulin Pen Needle 31G X 5 MM MISC To be used with Lantus insulin pen. Dispensed based on patient's insurance coverage. Please make sure these fit on the patients lantus pen  . metFORMIN (GLUCOPHAGE) 1000 MG tablet TAKE 1 TABLET TWICE DAILY WITH A MEAL  . montelukast (SINGULAIR) 10 MG tablet TAKE 1 TABLET AT BEDTIME  . nitroGLYCERIN (NITROSTAT) 0.4 MG SL tablet DISSOLVE ONE TABLET UNDER THE TONGUE EVERY 5 MINUTES AS NEEDED FOR CHEST PAIN. DO NOT EXCEED A TOTAL OF 3 DOSES IN 15 MINUTES  . pantoprazole (PROTONIX) 20 MG tablet TAKE 1 TABLET (20 MG TOTAL) BY MOUTH DAILY.  Marland Kitchen spironolactone (ALDACTONE) 100 MG tablet TAKE 1 TABLET EVERY DAY  . traZODone (DESYREL) 100 MG tablet Take 1 tablet (100 mg total) by mouth at bedtime.  . [DISCONTINUED] Insulin Glargine (LANTUS) 100 UNIT/ML Solostar Pen Inject 40 Units into the skin at bedtime.   No facility-administered encounter medications on file as of 08/28/2018.     ALLERGIES: Allergies  Allergen Reactions  . Ace Inhibitors Cough  . Latex Itching and Rash    VACCINATION STATUS: Immunization History  Administered Date(s) Administered  . Influenza Whole 03/29/2006, 11/27/2009  . Influenza,inj,Quad PF,6+ Mos 03/20/2013,  01/24/2014, 12/25/2014, 01/22/2016, 10/31/2016, 01/31/2017, 10/19/2017  . Pneumococcal Conjugate-13 06/06/2014  . Pneumococcal Polysaccharide-23 08/27/2004, 11/27/2009, 08/27/2015  . Td 08/27/2004    Diabetes She presents for her initial diabetic visit. She has type 2 diabetes mellitus. Onset time:  She was diagnosed at approximate age of 60. Her disease course has been worsening. There are no hypoglycemic associated symptoms. Pertinent negatives for hypoglycemia include no confusion, headaches, pallor or seizures. Associated symptoms include fatigue, polydipsia and polyuria. Pertinent negatives for diabetes include no chest pain and no polyphagia. There are no hypoglycemic complications. There are no diabetic complications. Risk factors for coronary artery disease include dyslipidemia, diabetes mellitus, obesity, sedentary lifestyle, post-menopausal and family history. Current diabetic treatment includes insulin injections and oral agent (monotherapy). Her weight is fluctuating minimally. She is following a generally unhealthy diet. When asked about meal planning, she reported none. She has had a previous visit with a dietitian. She never participates in exercise. (She brought in a log showing significantly above target fasting blood glucose profile currently on Lantus 20 units nightly.  Her most recent A1c was elevated at 11.4% on Jul 06, 2018.) An ACE inhibitor/angiotensin II receptor blocker is not being taken. Eye exam is current.     Review of Systems  Constitutional: Positive for fatigue. Negative for chills, fever and unexpected weight change.  HENT: Negative for trouble swallowing and voice change.   Eyes: Negative for visual disturbance.  Respiratory: Negative for cough, shortness of breath and wheezing.   Cardiovascular: Negative for chest pain, palpitations and leg swelling.  Gastrointestinal: Negative for diarrhea, nausea and vomiting.  Endocrine: Positive for polydipsia and polyuria.  Negative for cold intolerance, heat intolerance and polyphagia.  Musculoskeletal: Negative for arthralgias and myalgias.  Skin: Negative for color change, pallor, rash and wound.  Neurological: Negative for seizures and headaches.  Psychiatric/Behavioral: Negative for confusion and suicidal ideas.    Objective:    BP 112/70 (BP Location: Right Arm, Patient Position: Sitting, Cuff Size: Normal)   Pulse 77   Ht 5' 2.5" (1.588 m)   Wt 201 lb 6.4 oz (91.4 kg)   SpO2 97%   BMI 36.25 kg/m   Wt Readings from Last 3 Encounters:  08/28/18 201 lb 6.4 oz (91.4 kg)  07/26/18 209 lb (94.8 kg)  07/16/18 209 lb (94.8 kg)     Physical Exam Constitutional:      Appearance: She is well-developed.  HENT:     Head: Normocephalic and atraumatic.  Neck:     Musculoskeletal: Normal range of motion and neck supple.     Thyroid: No thyromegaly.     Trachea: No tracheal deviation.  Cardiovascular:     Rate and Rhythm: Normal rate and regular rhythm.  Pulmonary:     Effort: Pulmonary effort is normal.  Abdominal:     Tenderness: There is no abdominal tenderness. There is no guarding.  Musculoskeletal: Normal range of motion.  Skin:    General: Skin is warm and dry.     Coloration: Skin is not pale.     Findings: No erythema or rash.  Neurological:     Mental Status: She is alert and oriented to person, place, and time.     Cranial Nerves: No cranial nerve deficit.     Coordination: Coordination normal.     Deep Tendon Reflexes: Reflexes are normal and symmetric.  Psychiatric:        Judgment: Judgment normal.     CMP ( most recent) CMP     Component Value Date/Time   NA 136 07/06/2018 0832   K 4.5 07/06/2018 0832   CL 101 07/06/2018 0832   CO2 28 07/06/2018 0832   GLUCOSE 327 (H) 07/06/2018 0832   BUN 12 07/06/2018 0832     CREATININE 0.86 07/06/2018 0832   CALCIUM 9.4 07/06/2018 0832   PROT 6.8 07/06/2018 0832   ALBUMIN 4.1 10/24/2016 1257   AST 22 07/06/2018 0832   ALT 20  07/06/2018 0832   ALKPHOS 79 10/24/2016 1257   BILITOT 0.4 07/06/2018 0832   GFRNONAA 67 07/06/2018 0832   GFRAA 78 07/06/2018 0832     Diabetic Labs (most recent): Lab Results  Component Value Date   HGBA1C 11.4 (H) 07/06/2018   HGBA1C 8.9 (H) 02/08/2018   HGBA1C 8.5 (H) 08/21/2017     Lipid Panel ( most recent) Lipid Panel     Component Value Date/Time   CHOL 189 07/06/2018 0832   TRIG 98 07/06/2018 0832   HDL 43 (L) 07/06/2018 0832   CHOLHDL 4.4 07/06/2018 0832   VLDL 20 07/22/2016 1004   LDLCALC 126 (H) 07/06/2018 0832      Lab Results  Component Value Date   TSH 1.71 01/31/2017   TSH 2.75 05/26/2015   TSH 3.261 06/02/2014   TSH 3.039 05/07/2013   TSH 3.308 10/10/2011   TSH 2.811 03/19/2010   TSH 3.422 08/12/2009       Assessment & Plan:   1. Type 2 diabetes mellitus with vascular disease ([place]) - Jadwiga L Vandenboom has currently uncontrolled symptomatic type 2 DM since  73 years of age,  with most recent A1c of 11.4 %. Recent labs reviewed. - I had a long discussion with her about the progressive nature of diabetes and the pathology behind its complications. -her diabetes is complicated by obesity/sedentary life and she remains at a high risk for more acute and chronic complications which include CAD, CVA, CKD, retinopathy, and neuropathy. These are all discussed in detail with her.  - I have counseled her on diet management and weight loss, by adopting a carbohydrate restricted/protein rich diet. - she admits that there is a room for improvement in her food and drink choices. - Suggestion is made for her to avoid simple carbohydrates  from her diet including Cakes, Sweet Desserts, Ice Cream, Soda (diet and regular), Sweet Tea, Candies, Chips, Cookies, Store Bought Juices, Alcohol in Excess of  1-2 drinks a day, Artificial Sweeteners,  Coffee Creamer, and "Sugar-free" Products. This will help patient to have more stable blood glucose profile and potentially  avoid unintended weight gain.  - I encouraged her to switch to  unprocessed or minimally processed complex starch and increased protein intake (animal or plant source), fruits, and vegetables.  - she is advised to stick to a routine mealtimes to eat 3 meals  a day and avoid unnecessary snacks ( to snack only to correct hypoglycemia).   - she has already seen  Jearld Fenton, RDN, CDE for individualized diabetes education.  - I have approached her with the following individualized plan to manage diabetes and patient agrees:   -Based on her current glycemic burden, she will continue to need insulin treatment probably with basal/bolus insulin. -In preparation, I advised her to increase her Lantus to 40 units nightly, start monitoring blood glucose twice a day before breakfast and at bedtime and return in 9 weeks with her meter and logs for evaluation.  - she is warned not to take insulin without proper monitoring per orders. - she is encouraged to call clinic for blood glucose levels less than 70 or above 300 mg /dl. - she is advised to continue metformin 1000 mg twice a day, glipizide lowered to 10 mg XL only at breakfast.  - Patient  specific target  A1c;  LDL, HDL, Triglycerides, and  Waist Circumference were discussed in detail.  2) Blood Pressure /Hypertension:  her blood pressure is  controlled to target.   she is advised to continue her current medications including amlodipine , spironolactone daily with breakfast .  3) Lipids/Hyperlipidemia:   Review of her recent lipid panel showed  uncontrolled  LDL at 126 .  she  is advised to continue    Atorvastatin 20 mg daily at bedtime.  Side effects and precautions discussed with her.  4)  Weight/Diet:  Body mass index is 36.25 kg/m.  -   clearly complicating her diabetes care.  I discussed with her the fact that loss of 5 - 10% of her  current body weight will have the most impact on her diabetes management.  CDE Consult will be initiated .  Exercise, and detailed carbohydrates information provided  -  detailed on discharge instructions.  5) Chronic Care/Health Maintenance:  -she  is on Statin medications and  is encouraged to initiate and continue to follow up with Ophthalmology, Dentist,  Podiatrist at least yearly or according to recommendations, and advised to  stay away from smoking. I have recommended yearly flu vaccine and pneumonia vaccine at least every 5 years; moderate intensity exercise for up to 150 minutes weekly; and  sleep for at least 7 hours a day.  - she is  advised to maintain close follow up with Simpson, Margaret E, MD for primary care needs, as well as her other providers for optimal and coordinated care.  - Time spent with the patient: 45 minutes, of which >50% was spent in obtaining information about her symptoms, reviewing her previous labs/studies, evaluations, and treatments, counseling her about her currently uncontrolled, symptomatic type 2 diabetes; hypertension, hyperlipidemia, and developing plans for long term treatment based on the latest standards of care/guidelines.  Please refer to " Patient Self Inventory" in the Media  tab for reviewed elements of pertinent patient history.  Taisha L Chaudhuri participated in the discussions, expressed understanding, and voiced agreement with the above plans.  All questions were answered to her satisfaction. she is encouraged to contact clinic should she have any questions or concerns prior to her return visit.  Follow up plan: - Return in about 9 weeks (around 10/30/2018) for Follow up with Pre-visit Labs, Meter, and Logs.  Gebre Nida, MD [facility] [address] [place], [place] [address] Phone: [phone]  Fax: [phone]    08/28/2018, 6:09 PM  This note was partially dictated with voice recognition software. Similar sounding words can be transcribed inadequately or may not  be corrected upon  review.  

## 2018-08-28 NOTE — Patient Instructions (Signed)

## 2018-08-29 ENCOUNTER — Ambulatory Visit (INDEPENDENT_AMBULATORY_CARE_PROVIDER_SITE_OTHER): Payer: Medicare HMO

## 2018-08-29 ENCOUNTER — Ambulatory Visit (INDEPENDENT_AMBULATORY_CARE_PROVIDER_SITE_OTHER): Payer: Medicare HMO | Admitting: Orthopedic Surgery

## 2018-08-29 ENCOUNTER — Encounter: Payer: Self-pay | Admitting: Orthopedic Surgery

## 2018-08-29 VITALS — BP 129/74 | HR 75 | Temp 97.0°F | Ht 62.5 in | Wt 200.0 lb

## 2018-08-29 DIAGNOSIS — M25512 Pain in left shoulder: Secondary | ICD-10-CM | POA: Diagnosis not present

## 2018-08-29 DIAGNOSIS — M75112 Incomplete rotator cuff tear or rupture of left shoulder, not specified as traumatic: Secondary | ICD-10-CM | POA: Diagnosis not present

## 2018-08-29 DIAGNOSIS — M75102 Unspecified rotator cuff tear or rupture of left shoulder, not specified as traumatic: Secondary | ICD-10-CM | POA: Diagnosis not present

## 2018-08-29 DIAGNOSIS — G8929 Other chronic pain: Secondary | ICD-10-CM | POA: Diagnosis not present

## 2018-08-29 MED ORDER — TRAMADOL-ACETAMINOPHEN 37.5-325 MG PO TABS: 1.0000 | ORAL_TABLET | ORAL | 5 refills | Status: DC | PRN

## 2018-08-29 MED ORDER — NAPROXEN 500 MG PO TABS: 500.0000 mg | ORAL_TABLET | Freq: Two times a day (BID) | ORAL | 2 refills | Status: DC

## 2018-08-29 NOTE — Progress Notes (Signed)
Patient ID: Tiffany Jacobs, female   DOB: [birth year], 73 y.o.   MRN: [id number]  Chief Complaint  Patient presents with  . Shoulder Pain    left    HPI Tiffany Jacobs is a 73 y.o. female.  73 status post rotator cuff repair right shoulder says her left shoulder is hurt for several years going back to 2014 but worse in the last 2 months.  She has 9 out of 10 pain which starts in the front of the shoulder radiates down the arm in the biceps region with some weakness occasionally requiring her to lift her left arm with her right hand.  She is taking Tylenol arthritis and over-the-counter Goody powders Advil and the like.  No improvement.  Review of Systems Review of Systems  Respiratory: Positive for cough.   Cardiovascular: Positive for claudication.  Gastrointestinal: Positive for heartburn.  Musculoskeletal: Positive for myalgias.  Neurological: Negative.     Past Medical History:  Diagnosis Date  . Arthritis   . Asthma 1963  . Diabetes mellitus, type 2 ([place]) 2004  . GERD (gastroesophageal reflux disease) 2004  . Hypertension 2004  . Low back pain   . Obesity   . RAD (reactive airway disease)   . Sciatica   . Seasonal allergies     Past Surgical History:  Procedure Laterality Date  . ACROMIO-CLAVICULAR JOINT REPAIR Right 11/09/2012   Procedure: ACROMIO-CLAVICULAR JOINT REPAIR;  Surgeon: Carole Civil, MD;  Location: [facility];  Service: Orthopedics;  Laterality: Right;  . COLONOSCOPY N/A 10/10/2016   Procedure: COLONOSCOPY;  Surgeon: Danie Binder, MD;  [facility];  Service: Endoscopy;  Laterality: N/A;  10:30  . EYE SURGERY Right 05/09/2018   cataract  . left breast biopsy for benign disease    . SHOULDER ACROMIOPLASTY Right 11/09/2012   Procedure: RIGHT SHOULDER ACROMIOPLASTY;  Surgeon: Carole Civil, MD;  Location: [facility];  Service: Orthopedics;  Laterality: Right;  . SHOULDER OPEN ROTATOR CUFF REPAIR Right 11/09/2012   Procedure: ROTATOR CUFF  REPAIR Right SHOULDER OPEN;  Surgeon: Carole Civil, MD;  Location: [facility];  Service: Orthopedics;  Laterality: Right;  . total knee arthroplasty left  02-01-05   Dr. Aline Brochure  . TUBAL LIGATION  1970    Family History  Problem Relation Age of Onset  . Kidney failure Father   . Kidney disease Father   . Stroke Brother 46       used drugs , stroke and heart disease  . Diabetes Brother   . Hypertension Brother   . Cancer Daughter 58       breast   . Diabetes Mother   . Asthma Other   . Lung disease Other   . Cancer Other   . Heart disease Other   . Arthritis Other   . Colon cancer Neg Hx   . Colon polyps Neg Hx      Social History   Tobacco Use  . Smoking status: Former Smoker    Packs/day: 0.50    Years: 37.00    Pack years: 18.50    Types: Cigarettes    Quit date: 10/12/1988    Years since quitting: 29.8  . Smokeless tobacco: Never Used  Substance Use Topics  . Alcohol use: No  . Drug use: No    Allergies  Allergen Reactions  . Ace Inhibitors Cough  . Latex Itching and Rash    Allergies  Allergen Reactions  . Ace Inhibitors Cough  .  Latex Itching and Rash     Current Meds  Medication Sig  . albuterol (PROVENTIL) (2.5 MG/3ML) 0.083% nebulizer solution Take 2.5 mg by nebulization every 6 (six) hours as needed for wheezing or shortness of breath.  . albuterol (VENTOLIN HFA) 108 (90 Base) MCG/ACT inhaler Inhale 2 puffs into the lungs every 6 (six) hours as needed for wheezing or shortness of breath.  . amLODipine (NORVASC) 10 MG tablet Take 1 tablet (10 mg total) by mouth daily.  . atorvastatin (LIPITOR) 20 MG tablet Take 1 tablet (20 mg total) by mouth daily. Discontinue pravastatin effective 07/26/2018  . blood glucose meter kit and supplies Two times daily testing (dispense meter based on insurance preference) dx e11.65  . fluticasone (FLONASE) 50 MCG/ACT nasal spray Place 2 sprays into both nostrils daily.  . gabapentin (NEURONTIN) 300 MG capsule Take  1 capsule (300 mg total) by mouth at bedtime.  . glipiZIDE (GLUCOTROL XL) 10 MG 24 hr tablet TAKE 2 TABLETS EVERY DAY WITH BREAKFAST  . glucose blood test strip Use as instructed once daily dx E11.9  . hydrALAZINE (APRESOLINE) 25 MG tablet Take 1 tablet (25 mg total) by mouth 3 (three) times daily.  . Insulin Glargine (LANTUS) 100 UNIT/ML Solostar Pen Inject 40 Units into the skin at bedtime.  . Insulin Pen Needle 31G X 5 MM MISC To be used with Lantus insulin pen. Dispensed based on patient's insurance coverage. Please make sure these fit on the patients lantus pen  . metFORMIN (GLUCOPHAGE) 1000 MG tablet TAKE 1 TABLET TWICE DAILY WITH A MEAL  . montelukast (SINGULAIR) 10 MG tablet TAKE 1 TABLET AT BEDTIME  . nitroGLYCERIN (NITROSTAT) 0.4 MG SL tablet DISSOLVE ONE TABLET UNDER THE TONGUE EVERY 5 MINUTES AS NEEDED FOR CHEST PAIN. DO NOT EXCEED A TOTAL OF 3 DOSES IN 15 MINUTES  . pantoprazole (PROTONIX) 20 MG tablet TAKE 1 TABLET (20 MG TOTAL) BY MOUTH DAILY.  . spironolactone (ALDACTONE) 100 MG tablet TAKE 1 TABLET EVERY DAY  . traZODone (DESYREL) 100 MG tablet Take 1 tablet (100 mg total) by mouth at bedtime.       Physical Exam BP 129/74   Pulse 75   Temp (!) 97 F (36.1 C)   Ht 5' 2.5" (1.588 m)   Wt 200 lb (90.7 kg)   BMI 36.00 kg/m  Physical Exam Vitals signs and nursing note reviewed.  Constitutional:      Appearance: Normal appearance.  Neurological:     Mental Status: She is alert and oriented to person, place, and time.  Psychiatric:        Mood and Affect: Mood normal.    Ambulatory status normal with no assistive devices Right Shoulder Exam   Tenderness  The patient is experiencing no tenderness.  Range of Motion  Forward flexion: 150   Muscle Strength  The patient has normal right shoulder strength.  Tests  Apprehension: negative Cross arm: negative Impingement: negative Drop arm: negative  Other  Erythema: absent Scars: present Sensation:  normal Pulse: present   Left Shoulder Exam   Tenderness  The patient is experiencing tenderness in the biceps tendon (Anterior deltoid).  Range of Motion  Active abduction: 90  Passive abduction: 110  External rotation: 50  Forward flexion: 120 (Painful forward elevation the patient bends the elbow and then raises the arm up, when I asked her she could get the arm up with the elbow straight but had pain coming down and weakness coming down)   Muscle   Strength  Abduction: 4/5  Internal rotation: 5/5  External rotation: 5/5  Supraspinatus: 4/5  Subscapularis: 5/5  Biceps: 5/5   Tests  Apprehension: negative Cross arm: negative Impingement: positive Drop arm: positive Sulcus: absent  Other  Erythema: absent Scars: absent Sensation: normal Pulse: present       PROVOCATIVE TESTS left shoulder DROP ARM TEST POSITIVE LEFT SIDE  PAINFUL ARC 90 through 120 active and 150 passive EMPTY CAN -JOBST TEST pain weakness  LIFT OFF TEST normal BELLYPRESS TEST normal   MEDICAL DECISION SECTION  xrays ordered?  Yes My independent reading of xrays: See report left shoulder AP lateral   Encounter Diagnoses  Name Primary?  . Chronic left shoulder pain Yes  . Rotator cuff syndrome of left shoulder   . Nontraumatic incomplete tear of left rotator cuff      PLAN:   Meds ordered this encounter  Medications  . naproxen (NAPROSYN) 500 MG tablet    Sig: Take 1 tablet (500 mg total) by mouth 2 (two) times daily with a meal.    Dispense:  60 tablet    Refill:  2  . traMADol-acetaminophen (ULTRACET) 37.5-325 MG tablet    Sig: Take 1 tablet by mouth every 4 (four) hours as needed.    Dispense:  90 tablet    Refill:  5   Injection? YES MRI/CT/? NO  Procedure note the subacromial injection shoulder left   Verbal consent was obtained to inject the  Left   Shoulder  Timeout was completed to confirm the injection site is a subacromial space of the  left   shoulder  Medication used Depo-Medrol 40 mg and lidocaine 1% 3 cc  Anesthesia was provided by ethyl chloride  The injection was performed in the left  posterior subacromial space. After pinning the skin with alcohol and anesthetized the skin with ethyl chloride the subacromial space was injected using a 20-gauge needle. There were no complications  Sterile dressing was applied.   Meds ordered this encounter  Medications  . naproxen (NAPROSYN) 500 MG tablet    Sig: Take 1 tablet (500 mg total) by mouth 2 (two) times daily with a meal.    Dispense:  60 tablet    Refill:  2  . traMADol-acetaminophen (ULTRACET) 37.5-325 MG tablet    Sig: Take 1 tablet by mouth every 4 (four) hours as needed.    Dispense:  90 tablet    Refill:  5    FU 6 WEEKS

## 2018-08-30 ENCOUNTER — Other Ambulatory Visit: Payer: Self-pay

## 2018-08-30 DIAGNOSIS — G8929 Other chronic pain: Secondary | ICD-10-CM

## 2018-08-30 NOTE — Telephone Encounter (Addendum)
Patient called to see about her medications you had prescribed her yesterday. Stated she said [place], but it was sent to [facility] order pharmacy. Stated she would call them  to cancel those prescriptions because it will be about 7 days before she will get them. She  is asking if you can redo them and send into [place].  Naproxen 500 mg     Qty 60 Tablets Take 1 tablet(500 mg total) by mouth 2(two) times daily with a meal    Tramadol-Acetaminophen  37.5/325 mg  Qty 90 Tablets Take 1 tablet by mouth every 4(four) hours as needed.   PATIENT USES [facility]  4:09pm--Patient just called me back to say she did get those prescriptions canceled [facility] mail order pharmacy.

## 2018-08-31 MED ORDER — NAPROXEN 500 MG PO TABS: 500.0000 mg | ORAL_TABLET | Freq: Two times a day (BID) | ORAL | 2 refills | Status: DC

## 2018-08-31 MED ORDER — TRAMADOL-ACETAMINOPHEN 37.5-325 MG PO TABS: 1.0000 | ORAL_TABLET | ORAL | 5 refills | Status: DC | PRN

## 2018-09-14 ENCOUNTER — Other Ambulatory Visit (HOSPITAL_COMMUNITY): Payer: Self-pay | Admitting: Family Medicine

## 2018-09-14 DIAGNOSIS — Z1231 Encounter for screening mammogram for malignant neoplasm of breast: Secondary | ICD-10-CM

## 2018-09-27 ENCOUNTER — Telehealth: Payer: Self-pay | Admitting: Family Medicine

## 2018-09-27 NOTE — Telephone Encounter (Signed)
Patient called in reference to she went over to her eye appt today and the doctor was on vacation. She said that Dr Moshe Cipro set her up with this appt and wants the nurse to call her back to help her sort out her eye medication. Please advise.

## 2018-09-28 ENCOUNTER — Encounter (HOSPITAL_COMMUNITY): Payer: Self-pay

## 2018-09-28 ENCOUNTER — Ambulatory Visit (HOSPITAL_COMMUNITY)
Admission: RE | Admit: 2018-09-28 | Discharge: 2018-09-28 | Disposition: A | Discharge status: Home / Self Care | Payer: Medicare HMO | Source: Ambulatory Visit | Attending: Family Medicine | Admitting: Family Medicine

## 2018-09-28 ENCOUNTER — Other Ambulatory Visit: Payer: Self-pay

## 2018-09-28 DIAGNOSIS — Z1231 Encounter for screening mammogram for malignant neoplasm of breast: Secondary | ICD-10-CM | POA: Insufficient documentation

## 2018-09-28 NOTE — Telephone Encounter (Signed)
Left a message asking for a call back on Monday.

## 2018-10-03 ENCOUNTER — Other Ambulatory Visit: Payer: Self-pay | Admitting: Family Medicine

## 2018-10-04 NOTE — Telephone Encounter (Signed)
Called pt no answer °

## 2018-10-06 ENCOUNTER — Other Ambulatory Visit: Payer: Self-pay | Admitting: Family Medicine

## 2018-10-06 DIAGNOSIS — J45991 Cough variant asthma: Secondary | ICD-10-CM

## 2018-10-08 ENCOUNTER — Ambulatory Visit: Payer: Medicare HMO | Admitting: Nutrition

## 2018-10-10 ENCOUNTER — Other Ambulatory Visit: Payer: Self-pay

## 2018-10-10 ENCOUNTER — Ambulatory Visit (INDEPENDENT_AMBULATORY_CARE_PROVIDER_SITE_OTHER): Payer: Medicare HMO | Admitting: Orthopedic Surgery

## 2018-10-10 ENCOUNTER — Other Ambulatory Visit: Payer: Self-pay | Admitting: "Endocrinology

## 2018-10-10 ENCOUNTER — Ambulatory Visit: Payer: Medicare HMO | Admitting: Nutrition

## 2018-10-10 ENCOUNTER — Encounter: Payer: Self-pay | Admitting: Orthopedic Surgery

## 2018-10-10 VITALS — BP 157/79 | HR 86 | Temp 97.6°F | Ht 62.5 in | Wt 199.0 lb

## 2018-10-10 DIAGNOSIS — M75102 Unspecified rotator cuff tear or rupture of left shoulder, not specified as traumatic: Secondary | ICD-10-CM

## 2018-10-10 DIAGNOSIS — E1159 Type 2 diabetes mellitus with other circulatory complications: Secondary | ICD-10-CM

## 2018-10-10 NOTE — Patient Instructions (Signed)
Home exercises  

## 2018-10-10 NOTE — Progress Notes (Signed)
Chief Complaint  Patient presents with  . Shoulder Pain    left / feels better     73 years old treated for left shoulder pain with injection NSAID and therapy at home  Her shoulder has improved significantly she has very little discomfort  She has excellent forward elevation actively and a negative impingement sign  Encounter Diagnosis  Name Primary?  . Rotator cuff syndrome of left shoulder Yes    Improved continue exercises 3 times a week at home

## 2018-10-11 ENCOUNTER — Encounter: Payer: Medicare HMO | Attending: Family Medicine | Admitting: Nutrition

## 2018-10-11 VITALS — Ht 62.5 in | Wt 199.0 lb

## 2018-10-11 DIAGNOSIS — E669 Obesity, unspecified: Secondary | ICD-10-CM | POA: Insufficient documentation

## 2018-10-11 DIAGNOSIS — E1165 Type 2 diabetes mellitus with hyperglycemia: Secondary | ICD-10-CM | POA: Insufficient documentation

## 2018-10-11 DIAGNOSIS — IMO0002 Reserved for concepts with insufficient information to code with codable children: Secondary | ICD-10-CM

## 2018-10-11 DIAGNOSIS — E118 Type 2 diabetes mellitus with unspecified complications: Secondary | ICD-10-CM | POA: Insufficient documentation

## 2018-10-11 NOTE — Progress Notes (Signed)
Medical Nutrition Therapy:  Appt start time:0930  end time:  1000  Assessment:  Primary concerns today: Diabetes Type 2 for > 15 yrs. Just saw Dr. Dorris Fetch, Endocrinology. 10 units Lantus, Metformin 1000 mg BID and Glipizide 10 mg BID.  Cut out sodsa, Drinking water. Testing 2-3 times per day. Feeling better.  Currently remodeling house and not able to cook. Relies on her family to brin her foods.t. FBS 130-191,  300 this am. Eating later at night.Sees Dr. Moshe Cipro office. Walks some. Will get back on track with better meals once her kitchen remodel is finished and she can prepare her own meals.   Lab Results  Component Value Date   HGBA1C 11.4 (H) 07/06/2018    Wt Readings from Last 3 Encounters:  10/10/18 199 lb (90.3 kg)  08/29/18 200 lb (90.7 kg)  08/28/18 201 lb 6.4 oz (91.4 kg)   Ht Readings from Last 3 Encounters:  10/10/18 5' 2.5" (1.588 m)  08/29/18 5' 2.5" (1.588 m)  08/28/18 5' 2.5" (1.588 m)     Vitals with BMI 11/14/2017  Height 5' 2.5"  Weight 207 lbs 10 oz  BMI 01.00  Systolic   Diastolic   Pulse   Respirations    Lab Results  Component Value Date   HGBA1C 11.4 (H) 07/06/2018   Lipid Panel     Component Value Date/Time   CHOL 189 07/06/2018 0832   TRIG 98 07/06/2018 0832   HDL 43 (L) 07/06/2018 0832   CHOLHDL 4.4 07/06/2018 0832   VLDL 20 07/22/2016 1004   LDLCALC 126 (H) 07/06/2018 0832   CMP Latest Ref Rng & Units 07/06/2018 02/08/2018 08/21/2017  Glucose 65 - 99 mg/dL 327(H) 197(H) 220(H)  BUN 7 - 25 mg/dL '12 10 11  ' Creatinine 0.60 - 0.93 mg/dL 0.86 0.84 0.82  Sodium 135 - 146 mmol/L 136 141 140  Potassium 3.5 - 5.3 mmol/L 4.5 4.3 4.5  Chloride 98 - 110 mmol/L 101 102 103  CO2 20 - 32 mmol/L '28 30 27  ' Calcium 8.6 - 10.4 mg/dL 9.4 9.9 10.1  Total Protein 6.1 - 8.1 g/dL 6.8 - 7.0  Total Bilirubin 0.2 - 1.2 mg/dL 0.4 - 0.3  Alkaline Phos 33 - 130 U/L - - -  AST 10 - 35 U/L 22 - 22  ALT 6 - 29 U/L 20 - 18      Preferred Learning  Style:  Auditory  Visual  Hands on  Learning Readiness:   Ready  Change in progress   MEDICATIONS:   DIETARY INTAKE:    24-hr recall:  B ( AM): apricot 1/2 c L ( PM): [place] sandwich and lettuce/tomatoes, water Snk ( PM):  D ( PM): String beans and baked chicken, water  nk ( PM): Beverages: water  Usual physical activity:  Chair exercises.   Estimated energy needs: 1500  calories 170 g carbohydrates 112 g protein 42 g fat  Progress Towards Goal(s):  In progress.   Nutritional Diagnosis:  NB-1.1 Food and nutrition-related knowledge deficit As related to Diabetes.  As evidenced by A1C  8.5%.     Intervention:  Nutrition and Diabetes education provided on My Plate, CHO counting, meal planning, portion sizes, timing of meals, avoiding snacks between meals unless having a low blood sugar, target ranges for A1C and blood sugars, signs/symptoms and treatment of hyper/hypoglycemia, monitoring blood sugars, taking medications as prescribed, benefits of exercising 30 minutes per day and prevention of complications of DM.   Insulin Instruction  Patient  was seen on 02-20-18 for insulin instruction.  The following learning objectives were met by the patient during this visit:   Insulin Action of Lantus nsulins  10 units a day at nigth.  Reviewed insulin pen  including # units per syringe,    length of needles, vial VS Pen cartridge and needles  Hygiene and storage  Drawing up single   Rotation of Sites  Hypoglycemia- symptoms, causes , treatment choices  Record keeping and MD follow up  Hypoglycemia, causes, symptoms and treatment   Patient demonstrated understanding of insulin administration by return demonstration.  Patient received the following handouts:  Insulin Instruction Handout  Mixing Insulin Brochure by [facility] Getting Started                                        Patient to start on insulin as Rx'd by MD  Patient will be seen for follow-up as  needed.  Goals Increase more lower carb vegetables. Increase walking as tolerated Talk to Dr Moshe Cipro about adjusting insulin if BS are 190 in am. Get A1C down to 7%  Teaching Method Utilized:  Visual Auditory Hands on  Handouts given during visit include:  The Plate Method  Meal Plan Card  Barriers to learning/adherence to lifestyle change: none  Demonstrated degree of understanding via:  Teach Back   Monitoring/Evaluation:  Dietary intake, exercise, , and body weight in 3 month(s).

## 2018-10-29 DIAGNOSIS — E1159 Type 2 diabetes mellitus with other circulatory complications: Secondary | ICD-10-CM | POA: Diagnosis not present

## 2018-10-30 LAB — COMPLETE METABOLIC PANEL WITH GFR
AG Ratio: 1.6 (calc) (ref 1.0–2.5)
ALT: 21 U/L (ref 6–29)
AST: 26 U/L (ref 10–35)
Albumin: 4 g/dL (ref 3.6–5.1)
Alkaline phosphatase (APISO): 78 U/L (ref 37–153)
BUN: 11 mg/dL (ref 7–25)
CO2: 31 mmol/L (ref 20–32)
Calcium: 9.4 mg/dL (ref 8.6–10.4)
Chloride: 103 mmol/L (ref 98–110)
Creat: 0.67 mg/dL (ref 0.60–0.93)
GFR, Est African American: 101 mL/min/{1.73_m2} (ref >=60)
GFR, Est Non African American: 87 mL/min/{1.73_m2} (ref >=60)
Globulin: 2.5 g/dL (calc) (ref 1.9–3.7)
Glucose, Bld: 139 mg/dL — ABNORMAL HIGH (ref 65–99)
Potassium: 4.3 mmol/L (ref 3.5–5.3)
Sodium: 140 mmol/L (ref 135–146)
Total Bilirubin: 0.4 mg/dL (ref 0.2–1.2)
Total Protein: 6.5 g/dL (ref 6.1–8.1)

## 2018-10-30 LAB — HEMOGLOBIN A1C
Hgb A1c MFr Bld: 8.2 % of total Hgb — ABNORMAL HIGH (ref <5.7)
Mean Plasma Glucose: 189 (calc)
eAG (mmol/L): 10.4 (calc)

## 2018-11-01 ENCOUNTER — Encounter: Payer: Self-pay | Admitting: "Endocrinology

## 2018-11-01 ENCOUNTER — Other Ambulatory Visit: Payer: Self-pay

## 2018-11-01 ENCOUNTER — Ambulatory Visit (INDEPENDENT_AMBULATORY_CARE_PROVIDER_SITE_OTHER): Payer: Medicare HMO | Admitting: "Endocrinology

## 2018-11-01 DIAGNOSIS — E1159 Type 2 diabetes mellitus with other circulatory complications: Secondary | ICD-10-CM

## 2018-11-01 DIAGNOSIS — E782 Mixed hyperlipidemia: Secondary | ICD-10-CM

## 2018-11-01 DIAGNOSIS — I10 Essential (primary) hypertension: Secondary | ICD-10-CM

## 2018-11-01 NOTE — Progress Notes (Signed)
11/01/2018, 4:42 PM                                                    Endocrinology Telehealth Visit Follow up Note -During COVID -19 Pandemic  This visit type was conducted due to national recommendations for restrictions regarding the COVID-19 Pandemic  in an effort to limit this patient's exposure and mitigate transmission of the corona virus.  Due to her co-morbid illnesses, Tiffany Jacobs is at  moderate to high risk for complications without adequate follow up.  This format is felt to be most appropriate for her at this time.  I connected with this patient on 11/01/2018   by telephone and verified that I am speaking with the correct person using two identifiers. Tiffany Jacobs, [birth year]. she has verbally consented to this visit. All issues noted in this document were discussed and addressed. The format was not optimal for physical exam.    Subjective:    Patient ID: Tiffany Jacobs, female    DOB: 05/29/1945.  Tiffany Jacobs is being engaged in telehealth via telephone for follow-up of currently uncontrolled type 2 diabetes. PMD:  Fayrene Helper, MD.   Past Medical History:  Diagnosis Date  . Arthritis   . Asthma 1963  . Diabetes mellitus, type 2 ([place]) 2004  . GERD (gastroesophageal reflux disease) 2004  . Hypertension 2004  . Low back pain   . Obesity   . RAD (reactive airway disease)   . Sciatica   . Seasonal allergies     Past Surgical History:  Procedure Laterality Date  . ACROMIO-CLAVICULAR JOINT REPAIR Right 11/09/2012   Procedure: ACROMIO-CLAVICULAR JOINT REPAIR;  Surgeon: Carole Civil, MD;  Location: [facility];  Service: Orthopedics;  Laterality: Right;  . COLONOSCOPY N/A 10/10/2016   Procedure: COLONOSCOPY;  Surgeon: Danie Binder, MD;  Location: [facility];  Service: Endoscopy;  Laterality: N/A;  10:30  . EYE SURGERY Right 05/09/2018   cataract  . left breast biopsy for benign disease    . SHOULDER  ACROMIOPLASTY Right 11/09/2012   Procedure: RIGHT SHOULDER ACROMIOPLASTY;  Surgeon: Carole Civil, MD;  Location: [facility];  Service: Orthopedics;  Laterality: Right;  . SHOULDER OPEN ROTATOR CUFF REPAIR Right 11/09/2012   Procedure: ROTATOR CUFF REPAIR Right SHOULDER OPEN;  Surgeon: Carole Civil, MD;  Location: [facility];  Service: Orthopedics;  Laterality: Right;  . total knee arthroplasty left  02-01-05   Dr. Aline Brochure  . TUBAL LIGATION  1970    Social History   Socioeconomic History  . Marital status: Married    Spouse name: Jeneen Rinks   . Number of children: 7  . Years of education: 8  . Highest education level: GED or equivalent  Occupational History  . Occupation: disabled   Social Needs  . Financial resource strain: Somewhat hard  . Food insecurity    Worry: Sometimes true    Inability: Sometimes true  . Transportation needs    Medical: No    Non-medical: No  Tobacco Use  . Smoking status: Former Smoker    Packs/day: 0.50    Years: 37.00    Pack years: 18.50    Types: Cigarettes    Quit date: 10/12/1988    Years since quitting: 30.0  . Smokeless tobacco:  Never Used  Substance and Sexual Activity  . Alcohol use: No  . Drug use: No  . Sexual activity: Not Currently    Birth control/protection: Post-menopausal  Lifestyle  . Physical activity    Days per week: 7 days    Minutes per session: 20 min  . Stress: Not at all  Relationships  . Social connections    Talks on phone: More than three times a week    Gets together: Once a week    Attends religious service: 1 to 4 times per year    Active member of club or organization: No    Attends meetings of clubs or organizations: Never    Relationship status: Married  Other Topics Concern  . Not on file  Social History Narrative  . Not on file    Family History  Problem Relation Age of Onset  . Kidney failure Father   . Kidney disease Father   . Stroke Brother 74       used drugs , stroke and heart disease  .  Diabetes Brother   . Hypertension Brother   . Cancer Daughter 59       breast   . Diabetes Mother   . Asthma Other   . Lung disease Other   . Cancer Other   . Heart disease Other   . Arthritis Other   . Colon cancer Neg Hx   . Colon polyps Neg Hx     Outpatient Encounter Medications as of 11/01/2018  Medication Sig  . albuterol (PROVENTIL) (2.5 MG/3ML) 0.083% nebulizer solution Take 2.5 mg by nebulization every 6 (six) hours as needed for wheezing or shortness of breath.  Marland Kitchen albuterol (VENTOLIN HFA) 108 (90 Base) MCG/ACT inhaler Inhale 2 puffs into the lungs every 6 (six) hours as needed for wheezing or shortness of breath.  Marland Kitchen amLODipine (NORVASC) 10 MG tablet Take 1 tablet (10 mg total) by mouth daily.  Marland Kitchen atorvastatin (LIPITOR) 20 MG tablet Take 1 tablet (20 mg total) by mouth daily. Discontinue pravastatin effective 07/26/2018  . blood glucose meter kit and supplies Two times daily testing (dispense meter based on insurance preference) dx e11.65  . fluticasone (FLONASE) 50 MCG/ACT nasal spray Place 2 sprays into both nostrils daily.  Marland Kitchen gabapentin (NEURONTIN) 300 MG capsule Take 1 capsule (300 mg total) by mouth at bedtime.  Marland Kitchen glipiZIDE (GLUCOTROL XL) 10 MG 24 hr tablet TAKE 2 TABLETS EVERY DAY WITH BREAKFAST  . glucose blood test strip Use as instructed once daily dx E11.9  . hydrALAZINE (APRESOLINE) 25 MG tablet Take 1 tablet (25 mg total) by mouth 3 (three) times daily.  . Insulin Glargine (LANTUS) 100 UNIT/ML Solostar Pen Inject 40 Units into the skin at bedtime.  . Insulin Pen Needle 31G X 5 MM MISC To be used with Lantus insulin pen. Dispensed based on patient's insurance coverage. Please make sure these fit on the patients lantus pen  . metFORMIN (GLUCOPHAGE) 1000 MG tablet TAKE 1 TABLET TWICE DAILY WITH A MEAL  . montelukast (SINGULAIR) 10 MG tablet TAKE 1 TABLET AT BEDTIME  . naproxen (NAPROSYN) 500 MG tablet Take 1 tablet (500 mg total) by mouth 2 (two) times daily with a meal.   . nitroGLYCERIN (NITROSTAT) 0.4 MG SL tablet DISSOLVE ONE TABLET UNDER THE TONGUE EVERY 5 MINUTES AS NEEDED FOR CHEST PAIN. DO NOT EXCEED A TOTAL OF 3 DOSES IN 15 MINUTES  . pantoprazole (PROTONIX) 20 MG tablet TAKE 1 TABLET (20 MG TOTAL) BY  MOUTH DAILY.  Marland Kitchen spironolactone (ALDACTONE) 100 MG tablet TAKE 1 TABLET EVERY DAY  . traMADol-acetaminophen (ULTRACET) 37.5-325 MG tablet Take 1 tablet by mouth every 4 (four) hours as needed.  . traZODone (DESYREL) 100 MG tablet Take 1 tablet (100 mg total) by mouth at bedtime.  . TRUEplus Lancets 33G MISC TEST TWO TIMES DAILY   No facility-administered encounter medications on file as of 11/01/2018.     ALLERGIES: Allergies  Allergen Reactions  . Ace Inhibitors Cough  . Latex Itching and Rash    VACCINATION STATUS: Immunization History  Administered Date(s) Administered  . Influenza Whole 03/29/2006, 11/27/2009  . Influenza,inj,Quad PF,6+ Mos 03/20/2013, 01/24/2014, 12/25/2014, 01/22/2016, 10/31/2016, 01/31/2017, 10/19/2017  . Pneumococcal Conjugate-13 06/06/2014  . Pneumococcal Polysaccharide-23 08/27/2004, 11/27/2009, 08/27/2015  . Td 08/27/2004    Diabetes She presents for her follow-up diabetic visit. She has type 2 diabetes mellitus. Onset time: She was diagnosed at approximate age of 50. Her disease course has been improving. There are no hypoglycemic associated symptoms. Pertinent negatives for hypoglycemia include no confusion, headaches, pallor or seizures. Associated symptoms include fatigue. Pertinent negatives for diabetes include no chest pain, no polydipsia, no polyphagia and no polyuria. There are no hypoglycemic complications. There are no diabetic complications. Risk factors for coronary artery disease include dyslipidemia, diabetes mellitus, obesity, sedentary lifestyle, post-menopausal and family history. Current diabetic treatment includes insulin injections and oral agent (monotherapy). Her weight is fluctuating minimally. She  is following a generally unhealthy diet. When asked about meal planning, she reported none. She has had a previous visit with a dietitian. She never participates in exercise. Her breakfast blood glucose range is generally 140-180 mg/dl. Her bedtime blood glucose range is generally 180-200 mg/dl. Her overall blood glucose range is 180-200 mg/dl. An ACE inhibitor/angiotensin II receptor blocker is not being taken. Eye exam is current.     Limit her antibiotic.  Objective:    There were no vitals taken for this visit.  Wt Readings from Last 3 Encounters:  10/11/18 199 lb (90.3 kg)  10/10/18 199 lb (90.3 kg)  08/29/18 200 lb (90.7 kg)     Physical Exam Constitutional:      Appearance: She is well-developed.  HENT:     Head: Normocephalic and atraumatic.  Neck:     Musculoskeletal: Normal range of motion and neck supple.     Thyroid: No thyromegaly.     Trachea: No tracheal deviation.  Cardiovascular:     Rate and Rhythm: Normal rate and regular rhythm.  Pulmonary:     Effort: Pulmonary effort is normal.  Abdominal:     Tenderness: There is no abdominal tenderness. There is no guarding.  Musculoskeletal: Normal range of motion.  Skin:    General: Skin is warm and dry.     Coloration: Skin is not pale.     Findings: No erythema or rash.  Neurological:     Mental Status: She is alert and oriented to person, place, and time.     Cranial Nerves: No cranial nerve deficit.     Coordination: Coordination normal.     Deep Tendon Reflexes: Reflexes are normal and symmetric.  Psychiatric:        Judgment: Judgment normal.     CMP     Component Value Date/Time   NA 140 10/29/2018 1132   K 4.3 10/29/2018 1132   CL 103 10/29/2018 1132   CO2 31 10/29/2018 1132   GLUCOSE 139 (H) 10/29/2018 1132   BUN 11 10/29/2018 1132  CREATININE 0.67 10/29/2018 1132   CALCIUM 9.4 10/29/2018 1132   PROT 6.5 10/29/2018 1132   ALBUMIN 4.1 10/24/2016 1257   AST 26 10/29/2018 1132   ALT 21  10/29/2018 1132   ALKPHOS 79 10/24/2016 1257   BILITOT 0.4 10/29/2018 1132   GFRNONAA 87 10/29/2018 1132   GFRAA 101 10/29/2018 1132     Diabetic Labs (most recent): Lab Results  Component Value Date   HGBA1C 8.2 (H) 10/29/2018   HGBA1C 11.4 (H) 07/06/2018   HGBA1C 8.9 (H) 02/08/2018     Lipid Panel ( most recent) Lipid Panel     Component Value Date/Time   CHOL 189 07/06/2018 0832   TRIG 98 07/06/2018 0832   HDL 43 (L) 07/06/2018 0832   CHOLHDL 4.4 07/06/2018 0832   VLDL 20 07/22/2016 1004   LDLCALC 126 (H) 07/06/2018 0832      Lab Results  Component Value Date   TSH 1.71 01/31/2017   TSH 2.75 05/26/2015   TSH 3.261 06/02/2014   TSH 3.039 05/07/2013   TSH 3.308 10/10/2011   TSH 2.811 03/19/2010   TSH 3.422 08/12/2009       Assessment & Plan:   1. Type 2 diabetes mellitus with vascular disease ([place]) - Tiffany Jacobs has currently uncontrolled symptomatic type 2 DM since  73 years of age. -However blood glucose readings are improving and A1c is 8.2% improving from 11.4% during her last visit. Recent labs reviewed.  -her diabetes is complicated by obesity/sedentary life and she remains at a high risk for more acute and chronic complications which include CAD, CVA, CKD, retinopathy, and neuropathy. These are all discussed in detail with her.  - I have counseled her on diet management and weight loss, by adopting a carbohydrate restricted/protein rich diet.  - she  admits there is a room for improvement in her diet and drink choices. -  Suggestion is made for her to avoid simple carbohydrates  from her diet including Cakes, Sweet Desserts / Pastries, Ice Cream, Soda (diet and regular), Sweet Tea, Candies, Chips, Cookies, Sweet Pastries,  Store Bought Juices, Alcohol in Excess of  1-2 drinks a day, Artificial Sweeteners, Coffee Creamer, and "Sugar-free" Products. This will help patient to have stable blood glucose profile and potentially avoid unintended weight  gain.   - I encouraged her to switch to  unprocessed or minimally processed complex starch and increased protein intake (animal or plant source), fruits, and vegetables.  - she is advised to stick to a routine mealtimes to eat 3 meals  a day and avoid unnecessary snacks ( to snack only to correct hypoglycemia).   - she has already seen  Jearld Fenton, RDN, CDE for individualized diabetes education.  - I have approached her with the following individualized plan to manage diabetes and patient agrees:   -Based on her current glycemic burden, she will continue to need insulin treatment probably with basal/bolus insulin. -She worries about hypoglycemia, discussed and treatment the same including Lantus 40 units nightly, associated with monitoring her blood glucose-daily before breakfast and at bedtime.    - she is warned not to take insulin without proper monitoring per orders. - she is encouraged to call clinic for blood glucose levels less than 70 or above 300 mg /dl. - she is advised to continue metformin 1000 mg twice a day, glipizide lowered to 10 mg XL only at breakfast.  - Patient specific target  A1c;  LDL, HDL, Triglycerides, and  Waist Circumference were discussed  in detail.  2) Blood Pressure /Hypertension:  she is advised to home monitor blood pressure and report if > 140/90 on 2 separate readings.   she is advised to continue her current medications including amlodipine , spironolactone daily with breakfast .  3) Lipids/Hyperlipidemia:   Review of her recent lipid panel showed  uncontrolled  LDL at 126 .  she  is advised to continue atorvastatin 20 mg daily at bedtime.  Side effects and precautions discussed with her.  4)  Weight/Diet: Her BMI 36-   clearly complicating her diabetes care.  I discussed with her the fact that loss of 5 - 10% of her  current body weight will have the most impact on her diabetes management.  CDE Consult will be initiated . Exercise, and detailed  carbohydrates information provided  -  detailed on discharge instructions.  5) Chronic Care/Health Maintenance:  -she  is on Statin medications and  is encouraged to initiate and continue to follow up with Ophthalmology, Dentist,  Podiatrist at least yearly or according to recommendations, and advised to  stay away from smoking. I have recommended yearly flu vaccine and pneumonia vaccine at least every 5 years; moderate intensity exercise for up to 150 minutes weekly; and  sleep for at least 7 hours a day.  - she is  advised to maintain close follow up with Fayrene Helper, MD for primary care needs, as well as her other providers for optimal and coordinated care.  - Patient Care Time Today:  25 min, of which >50% was spent in  counseling and the rest reviewing her  current and  previous labs/studies, previous treatments, her blood glucose readings, and medications' doses and developing a plan for long-term care based on the latest recommendations for standards of care.   Tiffany Jacobs participated in the discussions, expressed understanding, and voiced agreement with the above plans.  All questions were answered to her satisfaction. she is encouraged to contact clinic should she have any questions or concerns prior to her return visit.   Follow up plan: - Return in about 4 months (around 03/03/2019) for Bring Meter and Logs- A1c in Office, Include 6 log sheets.  Glade Lloyd, MD [facility] [address] [place], [place] [address] Phone: [phone]  Fax: [phone]    11/01/2018, 4:42 PM  This note was partially dictated with voice recognition software. Similar sounding words can be transcribed inadequately or may not  be corrected upon review.

## 2018-11-13 ENCOUNTER — Encounter: Payer: Self-pay | Admitting: Nutrition

## 2018-11-13 ENCOUNTER — Other Ambulatory Visit: Payer: Self-pay

## 2018-11-13 ENCOUNTER — Encounter: Payer: Medicare HMO | Attending: "Endocrinology | Admitting: Nutrition

## 2018-11-13 VITALS — Ht 62.5 in | Wt 199.0 lb

## 2018-11-13 DIAGNOSIS — E1165 Type 2 diabetes mellitus with hyperglycemia: Secondary | ICD-10-CM | POA: Insufficient documentation

## 2018-11-13 DIAGNOSIS — IMO0002 Reserved for concepts with insufficient information to code with codable children: Secondary | ICD-10-CM

## 2018-11-13 DIAGNOSIS — E118 Type 2 diabetes mellitus with unspecified complications: Secondary | ICD-10-CM | POA: Insufficient documentation

## 2018-11-13 DIAGNOSIS — E669 Obesity, unspecified: Secondary | ICD-10-CM

## 2018-11-13 DIAGNOSIS — E1159 Type 2 diabetes mellitus with other circulatory complications: Secondary | ICD-10-CM | POA: Insufficient documentation

## 2018-11-13 DIAGNOSIS — E782 Mixed hyperlipidemia: Secondary | ICD-10-CM | POA: Insufficient documentation

## 2018-11-13 NOTE — Progress Notes (Signed)
Medical Nutrition Therapy:  Appt start time:0930  end time:  1000  Assessment:  Primary concerns today: Diabetes Type 2 for > 15 yrs. FBS:131-150's. Stays hungry, A1C down to 8.2% from 11.4%. 40 units Lantus. , Metformin 1000 mg BID. Glipizide. Feels a lot better. Has been exercises at home. Her daughter was in an accident and can't walk with her.  Admits to need to walk more and work on portions. Her daughter cooks for her and often brings her dinner a little later-730-8 pm.. Doing arm exercies and it's helping her arm and shoulder.   Lab Results  Component Value Date   HGBA1C 8.2 (H) 10/29/2018    Wt Readings from Last 3 Encounters:  11/13/18 199 lb (90.3 kg)  10/11/18 199 lb (90.3 kg)  10/10/18 199 lb (90.3 kg)   Ht Readings from Last 3 Encounters:  11/13/18 5' 2.5" (1.588 m)  10/11/18 5' 2.5" (1.588 m)  10/10/18 5' 2.5" (1.588 m)     Vitals with BMI 11/14/2017  Height 5' 2.5"  Weight 207 lbs 10 oz  BMI [id number]  Systolic   Diastolic   Pulse   Respirations    Lab Results  Component Value Date   HGBA1C 8.2 (H) 10/29/2018   Lipid Panel     Component Value Date/Time   CHOL 189 07/06/2018 0832   TRIG 98 07/06/2018 0832   HDL 43 (L) 07/06/2018 0832   CHOLHDL 4.4 07/06/2018 0832   VLDL 20 07/22/2016 1004   LDLCALC 126 (H) 07/06/2018 0832   CMP Latest Ref Rng & Units 10/29/2018 07/06/2018 02/08/2018  Glucose 65 - 99 mg/dL 139(H) 327(H) 197(H)  BUN 7 - 25 mg/dL 11 12 10   Creatinine 0.60 - 0.93 mg/dL 0.67 0.86 0.84  Sodium 135 - 146 mmol/L 140 136 141  Potassium 3.5 - 5.3 mmol/L 4.3 4.5 4.3  Chloride 98 - 110 mmol/L 103 101 102  CO2 20 - 32 mmol/L 31 28 30   Calcium 8.6 - 10.4 mg/dL 9.4 9.4 9.9  Total Protein 6.1 - 8.1 g/dL 6.5 6.8 -  Total Bilirubin 0.2 - 1.2 mg/dL 0.4 0.4 -  Alkaline Phos 33 - 130 U/L - - -  AST 10 - 35 U/L 26 22 -  ALT 6 - 29 U/L 21 20 -      Preferred Learning Style:  Auditory  Visual  Hands on  Learning Readiness:    Ready  Change in progress   MEDICATIONS:   DIETARY INTAKE:    24-hr recall:  B ( AM): apricot 1/2 c L ( PM): [place] sandwich and lettuce/tomatoes, water Snk ( PM):  D ( PM): String beans and baked chicken, water  nk ( PM): Beverages: water  Usual physical activity:  Chair exercises.   Estimated energy needs: 1500  calories 170 g carbohydrates 112 g protein 42 g fat  Progress Towards Goal(s):  In progress.   Nutritional Diagnosis:  NB-1.1 Food and nutrition-related knowledge deficit As related to Diabetes.  As evidenced by A1C  8.5%.     Intervention:  Nutrition and Diabetes education provided on My Plate, CHO counting, meal planning, portion sizes, timing of meals, avoiding snacks between meals unless having a low blood sugar, target ranges for A1C and blood sugars, signs/symptoms and treatment of hyper/hypoglycemia, monitoring blood sugars, taking medications as prescribed, benefits of exercising 30 minutes per day and prevention of complications of DM.   Breakfast:  1 egg and 2 slices toast and 2 slices bacon, water, hot  Tea  Lunch: Hamburger with bun, water Dinner: salad -chicken, water   Goals Keep up the great job! Keep eating more lower carb vegetables Eat meals on time Continue to walk as you can Get A1C 7%  Teaching Method Utilized:  Visual Auditory Hands on  Handouts given during visit include:  The Plate Method  Meal Plan Card  Barriers to learning/adherence to lifestyle change: none  Demonstrated degree of understanding via:  Teach Back   Monitoring/Evaluation:  Dietary intake, exercise, , and body weight in 3 month(s).

## 2018-11-13 NOTE — Patient Instructions (Signed)
Goals Increase more lower carb vegetables. Increase walking as tolerated Talk to Dr Simpson about adjusting insulin if BS are 190 in am. Get A1C down to 7%  

## 2018-11-29 ENCOUNTER — Other Ambulatory Visit: Payer: Self-pay | Admitting: "Endocrinology

## 2018-12-04 ENCOUNTER — Other Ambulatory Visit: Payer: Self-pay

## 2018-12-04 ENCOUNTER — Encounter: Payer: Self-pay | Admitting: Family Medicine

## 2018-12-04 ENCOUNTER — Ambulatory Visit (INDEPENDENT_AMBULATORY_CARE_PROVIDER_SITE_OTHER): Payer: Medicare HMO | Admitting: Family Medicine

## 2018-12-04 VITALS — BP 130/70 | HR 87 | Temp 98.4°F | Ht 62.5 in | Wt 203.0 lb

## 2018-12-04 DIAGNOSIS — Z23 Encounter for immunization: Secondary | ICD-10-CM | POA: Diagnosis not present

## 2018-12-04 DIAGNOSIS — E1159 Type 2 diabetes mellitus with other circulatory complications: Secondary | ICD-10-CM

## 2018-12-04 DIAGNOSIS — D126 Benign neoplasm of colon, unspecified: Secondary | ICD-10-CM | POA: Insufficient documentation

## 2018-12-04 DIAGNOSIS — Z9841 Cataract extraction status, right eye: Secondary | ICD-10-CM

## 2018-12-04 DIAGNOSIS — Z0000 Encounter for general adult medical examination without abnormal findings: Secondary | ICD-10-CM

## 2018-12-04 DIAGNOSIS — H5789 Other specified disorders of eye and adnexa: Secondary | ICD-10-CM | POA: Diagnosis not present

## 2018-12-04 MED ORDER — LANTUS SOLOSTAR 100 UNIT/ML ~~LOC~~ SOPN: 40.0000 [IU] | PEN_INJECTOR | Freq: Every day | SUBCUTANEOUS | 0 refills | Status: DC

## 2018-12-04 NOTE — Progress Notes (Signed)
Tiffany Jacobs     MRN: [id number]      DOB: 1945/06/01  HPI: Patient is in for annual physical exam. C/o itchy atery red eyes, syayes hse has ben lost to follow up after cataract surgery earlier this year and wants help with getting an appontment. Recent labs, if available are reviewed. Immunization is reviewed , and  updated    PE: BP 130/70    Pulse 87    Temp 98.4 F (36.9 C) (Temporal)    Ht 5' 2.5" (1.588 m)    Wt 203 lb (92.1 kg)    SpO2 97%    BMI 36.54 kg/m   Pleasant  female, alert and oriented x 3, in no cardio-pulmonary distress. Afebrile. HEENT No facial trauma or asymetry. Sinuses non tender.  Extra occullar muscles intact, bilateral conjunctival erythema and watering, right greater thanleft External ears normal, Neck: supple, no adenopathy,JVD or thyromegaly.No bruits.  Chest: Clear to ascultation bilaterally.No crackles or wheezes. Non tender to palpation  Breast: Not examined, asymptomatic and 09/2018 mammogram is normal  Cardiovascular system; Heart sounds normal,  S1 and  S2 ,no S3.  No murmur, or thrill. Apical beat not displaced Peripheral pulses normal.  Abdomen: Soft, non tender, No guarding, tenderness or rebound.    Musculoskeletal exam: Decreased  ROM of spine, hips , shoulders and knees.  Deformity ,swelling and  crepitus noted In knees No muscle wasting or atrophy.   Neurologic: Cranial nerves 2 to 12 intact. Power, tone ,sensation and reflexes normal throughout.  disturbance in gait. No tremor.  Skin: Intact, no ulceration, erythema , scaling or rash noted. Pigmentation normal throughout  Psych; Normal mood and affect. Judgement and concentration normal   Assessment & Plan:  Tubular adenoma of colon nEEDS REPT COLONOSCOPY IN 10/2019 PER Tiffany Jacobs  Annual physical exam Annual exam as documented. Counseling done  re healthy lifestyle involving commitment to 150 minutes exercise per week, heart healthy diet, and attaining  healthy weight.The importance of adequate sleep also discussed. Regular seat belt use and home safety, is also discussed. Changes in health habits are decided on by the patient with goals and time frames  set for achieving them. Immunization and cancer screening needs are specifically addressed at this visit.   Type 2 diabetes mellitus with vascular disease [facility]) Tiffany Jacobs is reminded of the importance of commitment to daily physical activity for 30 minutes or more, as able and the need to limit carbohydrate intake to 30 to 60 grams per meal to help with blood sugar control.   The need to take medication as prescribed, test blood sugar as directed, and to call between visits if there is a concern that blood sugar is uncontrolled is also discussed.   Tiffany Jacobs is reminded of the importance of daily foot exam, annual eye examination, and good blood sugar, blood pressure and cholesterol control. Improved, managed by Endo, pt aapplauded on improvement  Diabetic Labs Latest Ref Rng & Units 10/29/2018 07/06/2018 02/08/2018 08/21/2017 01/31/2017  HbA1c <5.7 % of total Hgb 8.2(H) 11.4(H) 8.9(H) 8.5(H) 9.1(H)  Microalbumin mg/dL - - 1.7 - -  Micro/Creat Ratio <30 mcg/mg creat - - 10 - -  Chol <200 mg/dL - 189 - 162 -  HDL > OR = 50 mg/dL - 43(L) - 48(L) -  Calc LDL mg/dL (calc) - 126(H) - 97 -  Triglycerides <150 mg/dL - 98 - 83 -  Creatinine 0.60 - 0.93 mg/dL 0.67 0.86 0.84 0.82 0.74  BP/Weight 12/04/2018 11/13/2018 10/11/2018 10/10/2018 08/29/2018 08/28/2018 [id number]  Systolic BP [id number] - - [id number] [id number]  Diastolic BP 70 - - 79 74 70 84  Wt. (Lbs) 203 199 199 199 200 201.4 209  BMI 36.54 35.82 35.82 35.82 36 36.25 37.62   Foot/eye exam completion dates Latest Ref Rng & Units 12/04/2018 02/14/2018  Eye Exam No Retinopathy - -  Foot exam Order - - -  Foot Form Completion - Done Done

## 2018-12-04 NOTE — Assessment & Plan Note (Signed)
nEEDS REPT COLONOSCOPY IN 10/2019 PER DR FIELDS

## 2018-12-04 NOTE — Patient Instructions (Addendum)
F/U in office with MD in 6 months, call if you need me before  Flu vaccine in office today.  You are referred to the eye specialist of your choice but you absolutely need to let the office staff know which office and physician you are requesting to be referred to.  Please get fasting lab work the  week of  December 14 or after, and urine specimen will be requested as well blood work includes heme HbA1c lipid CMP and EGFR CBC TSH and microalbumin urea, vit D, I will also send report to Dr Mack Guise foot exam  Congrats on improved blood sugar

## 2018-12-04 NOTE — Assessment & Plan Note (Signed)

## 2018-12-05 ENCOUNTER — Encounter: Payer: Self-pay | Admitting: Family Medicine

## 2018-12-05 NOTE — Assessment & Plan Note (Signed)
Ms. Poorman is reminded of the importance of commitment to daily physical activity for 30 minutes or more, as able and the need to limit carbohydrate intake to 30 to 60 grams per meal to help with blood sugar control.   The need to take medication as prescribed, test blood sugar as directed, and to call between visits if there is a concern that blood sugar is uncontrolled is also discussed.   Ms. Ostwald is reminded of the importance of daily foot exam, annual eye examination, and good blood sugar, blood pressure and cholesterol control. Improved, managed by Endo, pt aapplauded on improvement  Diabetic Labs Latest Ref Rng & Units 10/29/2018 07/06/2018 02/08/2018 08/21/2017 01/31/2017  HbA1c <5.7 % of total Hgb 8.2(H) 11.4(H) 8.9(H) 8.5(H) 9.1(H)  Microalbumin mg/dL - - 1.7 - -  Micro/Creat Ratio <30 mcg/mg creat - - 10 - -  Chol <200 mg/dL - 189 - 162 -  HDL > OR = 50 mg/dL - 43(L) - 48(L) -  Calc LDL mg/dL (calc) - 126(H) - 97 -  Triglycerides <150 mg/dL - 98 - 83 -  Creatinine 0.60 - 0.93 mg/dL 0.67 0.86 0.84 0.82 0.74   BP/Weight 12/04/2018 11/13/2018 10/11/2018 10/10/2018 08/29/2018 08/28/2018 [id number]  Systolic BP [id number] - - A999333 [id number]  Diastolic BP 70 - - 79 74 70 84  Wt. (Lbs) 203 199 199 199 200 201.4 209  BMI 36.54 35.82 35.82 35.82 36 36.25 37.62   Foot/eye exam completion dates Latest Ref Rng & Units 12/04/2018 02/14/2018  Eye Exam No Retinopathy - -  Foot exam Order - - -  Foot Form Completion - Done Done

## 2018-12-07 ENCOUNTER — Other Ambulatory Visit: Payer: Self-pay | Admitting: Family Medicine

## 2018-12-07 DIAGNOSIS — J3089 Other allergic rhinitis: Secondary | ICD-10-CM

## 2018-12-10 ENCOUNTER — Other Ambulatory Visit: Payer: Self-pay | Admitting: Family Medicine

## 2018-12-10 DIAGNOSIS — K219 Gastro-esophageal reflux disease without esophagitis: Secondary | ICD-10-CM

## 2018-12-12 ENCOUNTER — Other Ambulatory Visit: Payer: Self-pay

## 2018-12-12 MED ORDER — LANTUS SOLOSTAR 100 UNIT/ML ~~LOC~~ SOPN: 40.0000 [IU] | PEN_INJECTOR | Freq: Every day | SUBCUTANEOUS | 0 refills | Status: DC

## 2018-12-24 ENCOUNTER — Ambulatory Visit: Payer: Medicare HMO

## 2018-12-25 ENCOUNTER — Other Ambulatory Visit: Payer: Self-pay

## 2018-12-25 ENCOUNTER — Ambulatory Visit (INDEPENDENT_AMBULATORY_CARE_PROVIDER_SITE_OTHER): Payer: Medicare HMO | Admitting: Family Medicine

## 2018-12-25 ENCOUNTER — Encounter: Payer: Self-pay | Admitting: Family Medicine

## 2018-12-25 VITALS — BP 130/70 | HR 87 | Resp 15 | Ht 62.5 in | Wt 203.0 lb

## 2018-12-25 DIAGNOSIS — Z0000 Encounter for general adult medical examination without abnormal findings: Secondary | ICD-10-CM | POA: Diagnosis not present

## 2018-12-25 NOTE — Patient Instructions (Signed)
Tiffany Jacobs , Thank you for taking time to come for your Medicare Wellness Visit. I appreciate your ongoing commitment to your health goals. Please review the following plan we discussed and let me know if I can assist you in the future.   Please continue to practice social distancing to keep you, your family, and our community safe.  If you must go out, please wear a Mask and practice good handwashing.  Screening recommendations/referrals: Colonoscopy: Due 2028 Mammogram: Up to date  Bone Density: Up to date  Recommended yearly ophthalmology/optometry visit for glaucoma screening and checkup Recommended yearly dental visit for hygiene and checkup  Vaccinations: Influenza vaccine: Completed Pneumococcal vaccine: Completed Tdap vaccine: Due 2021 Shingles vaccine: checking coverages    Advanced directives: declined  Conditions/risks identified: Fall  Next appointment: 05/28/2019    Preventive Care 73 Years and Older, Female Preventive care refers to lifestyle choices and visits with your health care provider that can promote health and wellness. What does preventive care include?  A yearly physical exam. This is also called an annual well check.  Dental exams once or twice a year.  Routine eye exams. Ask your health care provider how often you should have your eyes checked.  Personal lifestyle choices, including:  Daily care of your teeth and gums.  Regular physical activity.  Eating a healthy diet.  Avoiding tobacco and drug use.  Limiting alcohol use.  Practicing safe sex.  Taking low-dose aspirin every day.  Taking vitamin and mineral supplements as recommended by your health care provider. What happens during an annual well check? The services and screenings done by your health care provider during your annual well check will depend on your age, overall health, lifestyle risk factors, and family history of disease. Counseling  Your health care provider may  ask you questions about your:  Alcohol use.  Tobacco use.  Drug use.  Emotional well-being.  Home and relationship well-being.  Sexual activity.  Eating habits.  History of falls.  Memory and ability to understand (cognition).  Work and work Statistician.  Reproductive health. Screening  You may have the following tests or measurements:  Height, weight, and BMI.  Blood pressure.  Lipid and cholesterol levels. These may be checked every 5 years, or more frequently if you are over 63 years old.  Skin check.  Lung cancer screening. You may have this screening every year starting at age 39 if you have a 30-pack-year history of smoking and currently smoke or have quit within the past 15 years.  Fecal occult blood test (FOBT) of the stool. You may have this test every year starting at age 70.  Flexible sigmoidoscopy or colonoscopy. You may have a sigmoidoscopy every 5 years or a colonoscopy every 10 years starting at age 54.  Hepatitis C blood test.  Hepatitis B blood test.  Sexually transmitted disease (STD) testing.  Diabetes screening. This is done by checking your blood sugar (glucose) after you have not eaten for a while (fasting). You may have this done every 1-3 years.  Bone density scan. This is done to screen for osteoporosis. You may have this done starting at age 33.  Mammogram. This may be done every 1-2 years. Talk to your health care provider about how often you should have regular mammograms. Talk with your health care provider about your test results, treatment options, and if necessary, the need for more tests. Vaccines  Your health care provider may recommend certain vaccines, such as:  Influenza vaccine.  This is recommended every year.  Tetanus, diphtheria, and acellular pertussis (Tdap, Td) vaccine. You may need a Td booster every 10 years.  Zoster vaccine. You may need this after age 62.  Pneumococcal 13-valent conjugate (PCV13) vaccine. One  dose is recommended after age 22.  Pneumococcal polysaccharide (PPSV23) vaccine. One dose is recommended after age 36. Talk to your health care provider about which screenings and vaccines you need and how often you need them. This information is not intended to replace advice given to you by your health care provider. Make sure you discuss any questions you have with your health care provider. Document Released: 03/20/2015 Document Revised: 11/11/2015 Document Reviewed: 12/23/2014 Elsevier Interactive Patient Education  2017 [employer] Prevention in the Home Falls can cause injuries. They can happen to people of all ages. There are many things you can do to make your home safe and to help prevent falls. What can I do on the outside of my home?  Regularly fix the edges of walkways and driveways and fix any cracks.  Remove anything that might make you trip as you walk through a door, such as a raised step or threshold.  Trim any bushes or trees on the path to your home.  Use bright outdoor lighting.  Clear any walking paths of anything that might make someone trip, such as rocks or tools.  Regularly check to see if handrails are loose or broken. Make sure that both sides of any steps have handrails.  Any raised decks and porches should have guardrails on the edges.  Have any leaves, snow, or ice cleared regularly.  Use sand or salt on walking paths during winter.  Clean up any spills in your garage right away. This includes oil or grease spills. What can I do in the bathroom?  Use night lights.  Install grab bars by the toilet and in the tub and shower. Do not use towel bars as grab bars.  Use non-skid mats or decals in the tub or shower.  If you need to sit down in the shower, use a plastic, non-slip stool.  Keep the floor dry. Clean up any water that spills on the floor as soon as it happens.  Remove soap buildup in the tub or shower regularly.  Attach bath  mats securely with double-sided non-slip rug tape.  Do not have throw rugs and other things on the floor that can make you trip. What can I do in the bedroom?  Use night lights.  Make sure that you have a light by your bed that is easy to reach.  Do not use any sheets or blankets that are too big for your bed. They should not hang down onto the floor.  Have a firm chair that has side arms. You can use this for support while you get dressed.  Do not have throw rugs and other things on the floor that can make you trip. What can I do in the kitchen?  Clean up any spills right away.  Avoid walking on wet floors.  Keep items that you use a lot in easy-to-reach places.  If you need to reach something above you, use a strong step stool that has a grab bar.  Keep electrical cords out of the way.  Do not use floor polish or wax that makes floors slippery. If you must use wax, use non-skid floor wax.  Do not have throw rugs and other things on the floor that can make  you trip. What can I do with my stairs?  Do not leave any items on the stairs.  Make sure that there are handrails on both sides of the stairs and use them. Fix handrails that are broken or loose. Make sure that handrails are as long as the stairways.  Check any carpeting to make sure that it is firmly attached to the stairs. Fix any carpet that is loose or worn.  Avoid having throw rugs at the top or bottom of the stairs. If you do have throw rugs, attach them to the floor with carpet tape.  Make sure that you have a light switch at the top of the stairs and the bottom of the stairs. If you do not have them, ask someone to add them for you. What else can I do to help prevent falls?  Wear shoes that:  Do not have high heels.  Have rubber bottoms.  Are comfortable and fit you well.  Are closed at the toe. Do not wear sandals.  If you use a stepladder:  Make sure that it is fully opened. Do not climb a closed  stepladder.  Make sure that both sides of the stepladder are locked into place.  Ask someone to hold it for you, if possible.  Clearly mark and make sure that you can see:  Any grab bars or handrails.  First and last steps.  Where the edge of each step is.  Use tools that help you move around (mobility aids) if they are needed. These include:  Canes.  Walkers.  Scooters.  Crutches.  Turn on the lights when you go into a dark area. Replace any light bulbs as soon as they burn out.  Set up your furniture so you have a clear path. Avoid moving your furniture around.  If any of your floors are uneven, fix them.  If there are any pets around you, be aware of where they are.  Review your medicines with your doctor. Some medicines can make you feel dizzy. This can increase your chance of falling. Ask your doctor what other things that you can do to help prevent falls. This information is not intended to replace advice given to you by your health care provider. Make sure you discuss any questions you have with your health care provider. Document Released: 12/18/2008 Document Revised: 07/30/2015 Document Reviewed: 03/28/2014 Elsevier Interactive Patient Education  2017 [employer].

## 2018-12-25 NOTE — Progress Notes (Signed)
Subjective:   Tiffany Jacobs is a 73 y.o. female who presents for Medicare Annual (Subsequent) preventive examination.  Location of Patient: Home Location of Provider: Telehealth Consent was obtain for visit to be over via telehealth.  I verified that I am speaking with the correct person using two identifiers.   Review of Systems:    Cardiac Risk Factors include: advanced age (>58mn, >>71women);diabetes mellitus;dyslipidemia;hypertension;obesity (BMI >30kg/m2)     Objective:     Vitals: BP 130/70   Pulse 87   Resp 15   Ht 5' 2.5" (1.588 m)   Wt 203 lb (92.1 kg)   BMI 36.54 kg/m   Body mass index is 36.54 kg/m.  Advanced Directives 12/20/2017 10/12/2016 10/10/2016 05/19/2015 05/04/2014 11/04/2013 11/09/2012  Does Patient Have a Medical Advance Directive? No No No No No No Patient would not like information;Patient does not have advance directive  Would patient like information on creating a medical advance directive? Yes (ED - Information included in AVS) No - Patient declined No - Patient declined No - patient declined information Yes - EScientist, clinical (histocompatibility and immunogenetics)given Yes - EScientist, clinical (histocompatibility and immunogenetics)given -  Pre-existing out of facility DNR order (yellow form or pink MOST form) - - - - - - No    Tobacco Social History   Tobacco Use  Smoking Status Former Smoker  . Packs/day: 0.50  . Years: 37.00  . Pack years: 18.50  . Types: Cigarettes  . Quit date: 10/12/1988  . Years since quitting: 30.2  Smokeless Tobacco Never Used     Counseling given: Yes   Clinical Intake:  Pre-visit preparation completed: Yes  Pain : No/denies pain Pain Score: 0-No pain     BMI - recorded: 36.54 Nutritional Status: BMI > 30  Obese Nutritional Risks: None Diabetes: Yes CBG done?: No Did pt. bring in CBG monitor from home?: No  How often do you need to have someone help you when you read instructions, pamphlets, or other written materials from your doctor or pharmacy?: 1 - Never What  is the last grade level you completed in school?: 9 but got GED  Interpreter Needed?: No     Past Medical History:  Diagnosis Date  . Arthritis   . Asthma 1963  . Chest pain with high risk for cardiac etiology 08/21/2017  . Diabetes mellitus, type 2 ([place] 2004  . GERD (gastroesophageal reflux disease) 2004  . Hypertension 2004  . Low back pain   . Obesity   . RAD (reactive airway disease)   . Sciatica   . Seasonal allergies    Past Surgical History:  Procedure Laterality Date  . ACROMIO-CLAVICULAR JOINT REPAIR Right 11/09/2012   Procedure: ACROMIO-CLAVICULAR JOINT REPAIR;  Surgeon: SCarole Civil MD;  Location: [facility];  Service: Orthopedics;  Laterality: Right;  . COLONOSCOPY N/A 10/10/2016   Procedure: COLONOSCOPY;  Surgeon: FDanie Binder MD;  Location: [facility];  Service: Endoscopy;  Laterality: N/A;  10:30  . EYE SURGERY Right 05/09/2018   cataract  . left breast biopsy for benign disease    . SHOULDER ACROMIOPLASTY Right 11/09/2012   Procedure: RIGHT SHOULDER ACROMIOPLASTY;  Surgeon: SCarole Civil MD;  Location: [facility];  Service: Orthopedics;  Laterality: Right;  . SHOULDER OPEN ROTATOR CUFF REPAIR Right 11/09/2012   Procedure: ROTATOR CUFF REPAIR Right SHOULDER OPEN;  Surgeon: SCarole Civil MD;  Location: [facility];  Service: Orthopedics;  Laterality: Right;  . total knee arthroplasty left  02-01-05   Dr.  Aline Brochure  . TUBAL LIGATION  1970   Family History  Problem Relation Age of Onset  . Kidney failure Father   . Kidney disease Father   . Stroke Brother 33       used drugs , stroke and heart disease  . Diabetes Brother   . Hypertension Brother   . Cancer Daughter 101       breast   . Diabetes Mother   . Asthma Other   . Lung disease Other   . Cancer Other   . Heart disease Other   . Arthritis Other   . Colon cancer Neg Hx   . Colon polyps Neg Hx    Social History   Socioeconomic History  . Marital status: Married    Spouse name: Jeneen Rinks   .  Number of children: 7  . Years of education: 8  . Highest education level: GED or equivalent  Occupational History  . Occupation: disabled   Social Needs  . Financial resource strain: Somewhat hard  . Food insecurity    Worry: Sometimes true    Inability: Sometimes true  . Transportation needs    Medical: No    Non-medical: No  Tobacco Use  . Smoking status: Former Smoker    Packs/day: 0.50    Years: 37.00    Pack years: 18.50    Types: Cigarettes    Quit date: 10/12/1988    Years since quitting: 30.2  . Smokeless tobacco: Never Used  Substance and Sexual Activity  . Alcohol use: No  . Drug use: No  . Sexual activity: Not Currently    Birth control/protection: Post-menopausal  Lifestyle  . Physical activity    Days per week: 7 days    Minutes per session: 20 min  . Stress: Not at all  Relationships  . Social connections    Talks on phone: More than three times a week    Gets together: Once a week    Attends religious service: 1 to 4 times per year    Active member of club or organization: No    Attends meetings of clubs or organizations: Never    Relationship status: Married  Other Topics Concern  . Not on file  Social History Narrative  . Not on file    Outpatient Encounter Medications as of 12/25/2018  Medication Sig  . albuterol (PROVENTIL) (2.5 MG/3ML) 0.083% nebulizer solution Take 2.5 mg by nebulization every 6 (six) hours as needed for wheezing or shortness of breath.  Marland Kitchen albuterol (VENTOLIN HFA) 108 (90 Base) MCG/ACT inhaler Inhale 2 puffs into the lungs every 6 (six) hours as needed for wheezing or shortness of breath.  Marland Kitchen amLODipine (NORVASC) 10 MG tablet Take 1 tablet (10 mg total) by mouth daily.  Marland Kitchen atorvastatin (LIPITOR) 20 MG tablet Take 1 tablet (20 mg total) by mouth daily. Discontinue pravastatin effective 07/26/2018  . blood glucose meter kit and supplies Two times daily testing (dispense meter based on insurance preference) dx e11.65  . fluticasone  (FLONASE) 50 MCG/ACT nasal spray USE 2 SPRAYS IN EACH NOSTRIL EVERY DAY  . gabapentin (NEURONTIN) 300 MG capsule Take 1 capsule (300 mg total) by mouth at bedtime.  Marland Kitchen glipiZIDE (GLUCOTROL XL) 10 MG 24 hr tablet TAKE 2 TABLETS EVERY DAY WITH BREAKFAST  . glucose blood test strip Use as instructed once daily dx E11.9  . hydrALAZINE (APRESOLINE) 25 MG tablet Take 1 tablet (25 mg total) by mouth 3 (three) times daily.  . Insulin Glargine (  LANTUS SOLOSTAR) 100 UNIT/ML Solostar Pen Inject 40 Units into the skin at bedtime.  . Insulin Pen Needle 31G X 5 MM MISC To be used with Lantus insulin pen. Dispensed based on patient's insurance coverage. Please make sure these fit on the patients lantus pen  . metFORMIN (GLUCOPHAGE) 1000 MG tablet TAKE 1 TABLET TWICE DAILY WITH A MEAL  . metoprolol tartrate (LOPRESSOR) 25 MG tablet Take 25 mg by mouth 2 (two) times daily.  . montelukast (SINGULAIR) 10 MG tablet TAKE 1 TABLET AT BEDTIME  . naproxen (NAPROSYN) 500 MG tablet Take 1 tablet (500 mg total) by mouth 2 (two) times daily with a meal.  . nitroGLYCERIN (NITROSTAT) 0.4 MG SL tablet DISSOLVE ONE TABLET UNDER THE TONGUE EVERY 5 MINUTES AS NEEDED FOR CHEST PAIN. DO NOT EXCEED A TOTAL OF 3 DOSES IN 15 MINUTES  . pantoprazole (PROTONIX) 20 MG tablet TAKE 1 TABLET EVERY DAY  . spironolactone (ALDACTONE) 100 MG tablet TAKE 1 TABLET EVERY DAY  . traMADol-acetaminophen (ULTRACET) 37.5-325 MG tablet Take 1 tablet by mouth every 4 (four) hours as needed.  . traZODone (DESYREL) 100 MG tablet Take 1 tablet (100 mg total) by mouth at bedtime.  . TRUEplus Lancets 33G MISC TEST TWO TIMES DAILY   No facility-administered encounter medications on file as of 12/25/2018.     Activities of Daily Living In your present state of health, do you have any difficulty performing the following activities: 12/25/2018  Hearing? N  Vision? Y  Comment but has an appt to see the eye doctor on 10/22  Difficulty concentrating or making  decisions? N  Walking or climbing stairs? N  Dressing or bathing? N  Doing errands, shopping? N  Preparing Food and eating ? N  Using the Toilet? N  In the past six months, have you accidently leaked urine? N  Do you have problems with loss of bowel control? N  Managing your Medications? N  Managing your Finances? N  Housekeeping or managing your Housekeeping? N  Some recent data might be hidden    Patient Care Team: Fayrene Helper, MD as PCP - General Herminio Commons, MD as PCP - Cardiology (Cardiology) Carole Civil, MD as Consulting Physician (Orthopedic Surgery) Leta Baptist, MD as Consulting Physician (Otolaryngology)    Assessment:   This is a routine wellness examination for [place].  Exercise Activities and Dietary recommendations Current Exercise Habits: Home exercise routine, Type of exercise: stretching;calisthenics, Time (Minutes): 40, Frequency (Times/Week): 7, Weekly Exercise (Minutes/Week): 280, Intensity: Mild, Exercise limited by: None identified  Goals    . DIET - REDUCE SUGAR INTAKE    . Have 3 meals a day    . Reduce fried foods     Recommend decreasing the amount of foods that are fried. Discussed using an air fryer in place of a deep fryer.       Fall Risk Fall Risk  12/25/2018 12/04/2018 10/11/2018 07/26/2018 07/16/2018  Falls in the past year? 0 0 0 0 0  Number falls in past yr: 0 0 0 0 0  Injury with Fall? 0 0 0 0 0  Risk for fall due to : - - - - -  Risk for fall due to: Comment - - - - -   Is the patient's home free of loose throw rugs in walkways, pet beds, electrical cords, etc?   yes      Grab bars in the bathroom? yes      Handrails on the  stairs?   yes      Adequate lighting?   yes     Depression Screen PHQ 2/9 Scores 12/25/2018 10/11/2018 02/14/2018 12/20/2017  PHQ - 2 Score 0 0 2 0  PHQ- 9 Score - - 2 -     Cognitive Function     6CIT Screen 12/25/2018 12/20/2017 10/12/2016  What Year? 0 points 0 points 0 points  What  month? 0 points 0 points 0 points  What time? 0 points 0 points 0 points  Count back from 20 0 points 0 points 0 points  Months in reverse 0 points 2 points 0 points  Repeat phrase 0 points 0 points 0 points  Total Score 0 2 0    Immunization History  Administered Date(s) Administered  . Fluad Quad(high Dose 65+) 12/04/2018  . Influenza Whole 03/29/2006, 11/27/2009  . Influenza,inj,Quad PF,6+ Mos 03/20/2013, 01/24/2014, 12/25/2014, 01/22/2016, 10/31/2016, 01/31/2017, 10/19/2017  . Pneumococcal Conjugate-13 06/06/2014  . Pneumococcal Polysaccharide-23 08/27/2004, 11/27/2009, 08/27/2015  . Td 08/27/2004    Qualifies for Shingles Vaccine? Checking coverage  Screening Tests Health Maintenance  Topic Date Due  . OPHTHALMOLOGY EXAM  02/01/2018  . TETANUS/TDAP  12/04/2019 (Originally 08/28/2014)  . HEMOGLOBIN A1C  05/01/2019  . FOOT EXAM  12/05/2019  . MAMMOGRAM  09/27/2020  . COLONOSCOPY  10/11/2026  . INFLUENZA VACCINE  Completed  . DEXA SCAN  Completed  . Hepatitis C Screening  Completed  . PNA vac Low Risk Adult  Completed    Cancer Screenings: Lung: Low Dose CT Chest recommended if Age 70-80 years, 30 pack-year currently smoking OR have quit w/in 15years. Patient does not qualify. Breast:  Up to date on Mammogram? Yes   Up to date of Bone Density/Dexa? Yes Colorectal:  Due 2028  Additional Screenings:   Hepatitis C Screening: completed     Plan:       1. Encounter for Medicare annual wellness exam   I have personally reviewed and noted the following in the patient's chart:   . Medical and social history . Use of alcohol, tobacco or illicit drugs  . Current medications and supplements . Functional ability and status . Nutritional status . Physical activity . Advanced directives . List of other physicians . Hospitalizations, surgeries, and ER visits in previous 12 months . Vitals . Screenings to include cognitive, depression, and falls . Referrals and  appointments  In addition, I have reviewed and discussed with patient certain preventive protocols, quality metrics, and best practice recommendations. A written personalized care plan for preventive services as well as general preventive health recommendations were provided to patient.     I provided 20 minutes of non-face-to-face time during this encounter.   Perlie Mayo, NP  12/25/2018

## 2018-12-27 DIAGNOSIS — H401112 Primary open-angle glaucoma, right eye, moderate stage: Secondary | ICD-10-CM | POA: Diagnosis not present

## 2019-01-17 DIAGNOSIS — H2512 Age-related nuclear cataract, left eye: Secondary | ICD-10-CM | POA: Diagnosis not present

## 2019-01-17 DIAGNOSIS — H401121 Primary open-angle glaucoma, left eye, mild stage: Secondary | ICD-10-CM | POA: Diagnosis not present

## 2019-01-17 DIAGNOSIS — Z01818 Encounter for other preprocedural examination: Secondary | ICD-10-CM | POA: Diagnosis not present

## 2019-02-11 DIAGNOSIS — H2512 Age-related nuclear cataract, left eye: Secondary | ICD-10-CM | POA: Diagnosis not present

## 2019-02-11 DIAGNOSIS — H401121 Primary open-angle glaucoma, left eye, mild stage: Secondary | ICD-10-CM | POA: Diagnosis not present

## 2019-02-11 DIAGNOSIS — H25812 Combined forms of age-related cataract, left eye: Secondary | ICD-10-CM | POA: Diagnosis not present

## 2019-03-06 ENCOUNTER — Encounter: Payer: Self-pay | Admitting: Nutrition

## 2019-03-06 ENCOUNTER — Encounter: Payer: Self-pay | Admitting: "Endocrinology

## 2019-03-06 ENCOUNTER — Encounter: Payer: Medicare HMO | Attending: Family Medicine | Admitting: Nutrition

## 2019-03-06 ENCOUNTER — Ambulatory Visit (INDEPENDENT_AMBULATORY_CARE_PROVIDER_SITE_OTHER): Payer: Medicare HMO | Admitting: "Endocrinology

## 2019-03-06 ENCOUNTER — Other Ambulatory Visit: Payer: Self-pay

## 2019-03-06 VITALS — Ht 62.5 in | Wt 194.0 lb

## 2019-03-06 VITALS — BP 128/77 | HR 99 | Ht 62.0 in | Wt 194.0 lb

## 2019-03-06 DIAGNOSIS — E782 Mixed hyperlipidemia: Secondary | ICD-10-CM | POA: Diagnosis not present

## 2019-03-06 DIAGNOSIS — E1165 Type 2 diabetes mellitus with hyperglycemia: Secondary | ICD-10-CM | POA: Insufficient documentation

## 2019-03-06 DIAGNOSIS — E669 Obesity, unspecified: Secondary | ICD-10-CM | POA: Insufficient documentation

## 2019-03-06 DIAGNOSIS — IMO0002 Reserved for concepts with insufficient information to code with codable children: Secondary | ICD-10-CM

## 2019-03-06 DIAGNOSIS — I10 Essential (primary) hypertension: Secondary | ICD-10-CM

## 2019-03-06 DIAGNOSIS — E118 Type 2 diabetes mellitus with unspecified complications: Secondary | ICD-10-CM | POA: Diagnosis not present

## 2019-03-06 DIAGNOSIS — E1159 Type 2 diabetes mellitus with other circulatory complications: Secondary | ICD-10-CM | POA: Diagnosis not present

## 2019-03-06 LAB — POCT GLYCOSYLATED HEMOGLOBIN (HGB A1C): Hemoglobin A1C: 7.2 % — AB (ref 4.0–5.6)

## 2019-03-06 NOTE — Progress Notes (Signed)
03/06/2019, 4:39 PM   Endocrinology follow-up note    Subjective:    Patient ID: Tiffany Jacobs, female    DOB: 22-Nov-1945.  [name] is being seen for follow-up of currently uncontrolled type 2 diabetes. PMD:  Fayrene Helper, MD.   Past Medical History:  Diagnosis Date  . Arthritis   . Asthma 1963  . Chest pain with high risk for cardiac etiology 08/21/2017  . Diabetes mellitus, type 2 ([place]) 2004  . GERD (gastroesophageal reflux disease) 2004  . Hypertension 2004  . Low back pain   . Obesity   . RAD (reactive airway disease)   . Sciatica   . Seasonal allergies     Past Surgical History:  Procedure Laterality Date  . ACROMIO-CLAVICULAR JOINT REPAIR Right 11/09/2012   Procedure: ACROMIO-CLAVICULAR JOINT REPAIR;  Surgeon: Carole Civil, MD;  Location: [facility];  Service: Orthopedics;  Laterality: Right;  . COLONOSCOPY N/A 10/10/2016   Procedure: COLONOSCOPY;  Surgeon: Danie Binder, MD;  Location: [facility];  Service: Endoscopy;  Laterality: N/A;  10:30  . EYE SURGERY Right 05/09/2018   cataract  . left breast biopsy for benign disease    . SHOULDER ACROMIOPLASTY Right 11/09/2012   Procedure: RIGHT SHOULDER ACROMIOPLASTY;  Surgeon: Carole Civil, MD;  Location: [facility];  Service: Orthopedics;  Laterality: Right;  . SHOULDER OPEN ROTATOR CUFF REPAIR Right 11/09/2012   Procedure: ROTATOR CUFF REPAIR Right SHOULDER OPEN;  Surgeon: Carole Civil, MD;  Location: [facility];  Service: Orthopedics;  Laterality: Right;  . total knee arthroplasty left  02-01-05   Dr. Aline Brochure  . TUBAL LIGATION  1970    Social History   Socioeconomic History  . Marital status: Married    Spouse name: Jeneen Rinks   . Number of children: 7  . Years of education: 8  . Highest education level: GED or equivalent  Occupational History  . Occupation: disabled   Tobacco Use  . Smoking status: Former Smoker    Packs/day: 0.50    Years: 37.00     Pack years: 18.50    Types: Cigarettes    Quit date: 10/12/1988    Years since quitting: 30.4  . Smokeless tobacco: Never Used  Substance and Sexual Activity  . Alcohol use: No  . Drug use: No  . Sexual activity: Not Currently    Birth control/protection: Post-menopausal  Other Topics Concern  . Not on file  Social History Narrative  . Not on file   Social Determinants of Health   Financial Resource Strain:   . Difficulty of Paying Living Expenses: Not on file  Food Insecurity:   . Worried About Charity fundraiser in the Last Year: Not on file  . Ran Out of Food in the Last Year: Not on file  Transportation Needs:   . Lack of Transportation (Medical): Not on file  . Lack of Transportation (Non-Medical): Not on file  Physical Activity:   . Days of Exercise per Week: Not on file  . Minutes of Exercise per Session: Not on file  Stress:   . Feeling of Stress : Not on file  Social Connections:   . Frequency of Communication with Friends and Family: Not on file  . Frequency of Social Gatherings with Friends and Family: Not on file  . Attends Religious Services: Not on file  . Active Member of Clubs or Organizations: Not on file  . Attends Club  or Organization Meetings: Not on file  . Marital Status: Not on file    Family History  Problem Relation Age of Onset  . Kidney failure Father   . Kidney disease Father   . Stroke Brother 76       used drugs , stroke and heart disease  . Diabetes Brother   . Hypertension Brother   . Cancer Daughter 36       breast   . Diabetes Mother   . Asthma Other   . Lung disease Other   . Cancer Other   . Heart disease Other   . Arthritis Other   . Colon cancer Neg Hx   . Colon polyps Neg Hx     Outpatient Encounter Medications as of 03/06/2019  Medication Sig  . albuterol (PROVENTIL) (2.5 MG/3ML) 0.083% nebulizer solution Take 2.5 mg by nebulization every 6 (six) hours as needed for wheezing or shortness of breath.  Marland Kitchen albuterol  (VENTOLIN HFA) 108 (90 Base) MCG/ACT inhaler Inhale 2 puffs into the lungs every 6 (six) hours as needed for wheezing or shortness of breath.  Marland Kitchen amLODipine (NORVASC) 10 MG tablet Take 1 tablet (10 mg total) by mouth daily.  Marland Kitchen atorvastatin (LIPITOR) 20 MG tablet Take 1 tablet (20 mg total) by mouth daily. Discontinue pravastatin effective 07/26/2018  . blood glucose meter kit and supplies Two times daily testing (dispense meter based on insurance preference) dx e11.65  . fluticasone (FLONASE) 50 MCG/ACT nasal spray USE 2 SPRAYS IN EACH NOSTRIL EVERY DAY  . gabapentin (NEURONTIN) 300 MG capsule Take 1 capsule (300 mg total) by mouth at bedtime.  Marland Kitchen glipiZIDE (GLUCOTROL XL) 10 MG 24 hr tablet TAKE 2 TABLETS EVERY DAY WITH BREAKFAST  . glucose blood test strip Use as instructed once daily dx E11.9  . hydrALAZINE (APRESOLINE) 25 MG tablet Take 1 tablet (25 mg total) by mouth 3 (three) times daily.  . Insulin Glargine (LANTUS SOLOSTAR) 100 UNIT/ML Solostar Pen Inject 40 Units into the skin at bedtime.  . Insulin Pen Needle 31G X 5 MM MISC To be used with Lantus insulin pen. Dispensed based on patient's insurance coverage. Please make sure these fit on the patients lantus pen  . metFORMIN (GLUCOPHAGE) 1000 MG tablet TAKE 1 TABLET TWICE DAILY WITH A MEAL  . metoprolol tartrate (LOPRESSOR) 25 MG tablet Take 25 mg by mouth 2 (two) times daily.  . montelukast (SINGULAIR) 10 MG tablet TAKE 1 TABLET AT BEDTIME  . naproxen (NAPROSYN) 500 MG tablet Take 1 tablet (500 mg total) by mouth 2 (two) times daily with a meal.  . nitroGLYCERIN (NITROSTAT) 0.4 MG SL tablet DISSOLVE ONE TABLET UNDER THE TONGUE EVERY 5 MINUTES AS NEEDED FOR CHEST PAIN. DO NOT EXCEED A TOTAL OF 3 DOSES IN 15 MINUTES  . pantoprazole (PROTONIX) 20 MG tablet TAKE 1 TABLET EVERY DAY  . spironolactone (ALDACTONE) 100 MG tablet TAKE 1 TABLET EVERY DAY  . traMADol-acetaminophen (ULTRACET) 37.5-325 MG tablet Take 1 tablet by mouth every 4 (four) hours  as needed.  . traZODone (DESYREL) 100 MG tablet Take 1 tablet (100 mg total) by mouth at bedtime.  . TRUEplus Lancets 33G MISC TEST TWO TIMES DAILY   No facility-administered encounter medications on file as of 03/06/2019.    ALLERGIES: Allergies  Allergen Reactions  . Ace Inhibitors Cough  . Latex Itching and Rash    VACCINATION STATUS: Immunization History  Administered Date(s) Administered  . Fluad Quad(high Dose 65+) 12/04/2018  . Influenza Whole  03/29/2006, 11/27/2009  . Influenza,inj,Quad PF,6+ Mos 03/20/2013, 01/24/2014, 12/25/2014, 01/22/2016, 10/31/2016, 01/31/2017, 10/19/2017  . Pneumococcal Conjugate-13 06/06/2014  . Pneumococcal Polysaccharide-23 08/27/2004, 11/27/2009, 08/27/2015  . Td 08/27/2004    Diabetes She presents for her follow-up diabetic visit. She has type 2 diabetes mellitus. Onset time: She was diagnosed at approximate age of 57. Her disease course has been improving. There are no hypoglycemic associated symptoms. Pertinent negatives for hypoglycemia include no confusion, headaches, pallor or seizures. Associated symptoms include fatigue. Pertinent negatives for diabetes include no chest pain, no polydipsia, no polyphagia and no polyuria. There are no hypoglycemic complications. Symptoms are improving. There are no diabetic complications. Risk factors for coronary artery disease include dyslipidemia, diabetes mellitus, obesity, sedentary lifestyle, post-menopausal and family history. Current diabetic treatment includes insulin injections and oral agent (monotherapy). Her weight is decreasing steadily. She is following a generally unhealthy diet. When asked about meal planning, she reported none. She has had a previous visit with a dietitian. She never participates in exercise. Her breakfast blood glucose range is generally 130-140 mg/dl. Her bedtime blood glucose range is generally 140-180 mg/dl. Her overall blood glucose range is 140-180 mg/dl. (She presents with  controlled glycemic profile, both fasting and postprandial.  Her point-of-care A1c is 7.2%, progressively improving from 11.4%.) An ACE inhibitor/angiotensin II receptor blocker is not being taken. Eye exam is current.    Review of systems Constitutional: +weight loss, no fatigue, no subjective hyperthermia, no subjective hypothermia Eyes: no blurry vision, no xerophthalmia ENT: no sore throat, no nodules palpated in throat, no dysphagia/odynophagia, no hoarseness Cardiovascular: no Chest Pain, no Shortness of Breath, no palpitations, no leg swelling Respiratory: no cough, no SOB Gastrointestinal: no Nausea/Vomiting/Diarhhea Musculoskeletal: no muscle/joint aches Skin: no rashes Neurological: no tremors, no numbness, no tingling, no dizziness Psychiatric: no depression, no anxiety   Objective:    BP 128/77   Pulse 99   Ht '5\' 2"'  (1.575 m)   Wt 194 lb (88 kg)   BMI 35.48 kg/m   Wt Readings from Last 3 Encounters:  03/06/19 194 lb (88 kg)  03/06/19 194 lb (88 kg)  12/25/18 203 lb (92.1 kg)     Physical Exam- Limited  Constitutional:  Body mass index is 35.48 kg/m. , not in acute distress, normal state of mind Eyes:  EOMI, no exophthalmos Neck: Supple Respiratory: Adequate breathing efforts Musculoskeletal: no gross deformities, strength intact in all four extremities, no gross restriction of joint movements Skin:  no rashes, no hyperemia Neurological: no tremor with outstretched hands.    CMP     Component Value Date/Time   NA 140 10/29/2018 1132   K 4.3 10/29/2018 1132   CL 103 10/29/2018 1132   CO2 31 10/29/2018 1132   GLUCOSE 139 (H) 10/29/2018 1132   BUN 11 10/29/2018 1132   CREATININE 0.67 10/29/2018 1132   CALCIUM 9.4 10/29/2018 1132   PROT 6.5 10/29/2018 1132   ALBUMIN 4.1 10/24/2016 1257   AST 26 10/29/2018 1132   ALT 21 10/29/2018 1132   ALKPHOS 79 10/24/2016 1257   BILITOT 0.4 10/29/2018 1132   GFRNONAA 87 10/29/2018 1132   GFRAA 101 10/29/2018 1132      Diabetic Labs (most recent): Lab Results  Component Value Date   HGBA1C 7.2 (A) 03/06/2019   HGBA1C 8.2 (H) 10/29/2018   HGBA1C 11.4 (H) 07/06/2018     Lipid Panel ( most recent) Lipid Panel     Component Value Date/Time   CHOL 189 07/06/2018 0832  TRIG 98 07/06/2018 0832   HDL 43 (L) 07/06/2018 0832   CHOLHDL 4.4 07/06/2018 0832   VLDL 20 07/22/2016 1004   LDLCALC 126 (H) 07/06/2018 0832      Lab Results  Component Value Date   TSH 1.71 01/31/2017   TSH 2.75 05/26/2015   TSH 3.261 06/02/2014   TSH 3.039 05/07/2013   TSH 3.308 10/10/2011   TSH 2.811 03/19/2010   TSH 3.422 08/12/2009       Assessment & Plan:   1. Type 2 diabetes mellitus with vascular disease ([place]) - Kess L Mabin has currently uncontrolled symptomatic type 2 DM since  73 years of age. -She presents with controlled glycemic profile, and point-of-care A1c 7.2% improving from 11.4% .  Recent labs reviewed.  -her diabetes is complicated by obesity/sedentary life and she remains at a high risk for more acute and chronic complications which include CAD, CVA, CKD, retinopathy, and neuropathy. These are all discussed in detail with her.  - I have counseled her on diet management and weight loss, by adopting a carbohydrate restricted/protein rich diet.  - she  admits there is a room for improvement in her diet and drink choices. -  Suggestion is made for her to avoid simple carbohydrates  from her diet including Cakes, Sweet Desserts / Pastries, Ice Cream, Soda (diet and regular), Sweet Tea, Candies, Chips, Cookies, Sweet Pastries,  Store Bought Juices, Alcohol in Excess of  1-2 drinks a day, Artificial Sweeteners, Coffee Creamer, and "Sugar-free" Products. This will help patient to have stable blood glucose profile and potentially avoid unintended weight gain.  - I encouraged her to switch to  unprocessed or minimally processed complex starch and increased protein intake (animal or plant  source), fruits, and vegetables.  - she is advised to stick to a routine mealtimes to eat 3 meals  a day and avoid unnecessary snacks ( to snack only to correct hypoglycemia).   - she is following with Jearld Fenton, RDN, CDE for individualized diabetes education.  - I have approached her with the following individualized plan to manage diabetes and patient agrees:   -Based on her current glycemic response, she will not need prandial insulin for now.     -She worries about hypoglycemia, discussed and kept her treatment  The same   Including Lantus 40 units nightly, associated with monitoring her blood glucose-daily before breakfast and at bedtime.    - she is warned not to take insulin without proper monitoring per orders. - she is encouraged to call clinic for blood glucose levels less than 70 or above 300 mg /dl. - she is advised to continue metformin 1000 mg twice a day, glipizide  10 mg XL only at breakfast.  - Patient specific target  A1c;  LDL, HDL, Triglycerides, and  Waist Circumference were discussed in detail.  2) Blood Pressure /Hypertension:  Her blood pressure is controlled to target.   she is advised to continue her current medications including amlodipine , spironolactone daily with breakfast .  3) Lipids/Hyperlipidemia:   Review of her recent lipid panel showed  uncontrolled  LDL at 126 .  she  is advised to continue atorvastatin 20 mg daily at bedtime.  Side effects and precautions discussed with her.  4)  Weight/Diet: Her BMI 36-   clearly complicating her diabetes care.  I discussed with her the fact that loss of 5 - 10% of her  current body weight will have the most impact on her diabetes management.  CDE Consult will be initiated . Exercise, and detailed carbohydrates information provided  -  detailed on discharge instructions.  5) Chronic Care/Health Maintenance:  -she  is on Statin medications and  is encouraged to initiate and continue to follow up with  Ophthalmology, Dentist,  Podiatrist at least yearly or according to recommendations, and advised to  stay away from smoking. I have recommended yearly flu vaccine and pneumonia vaccine at least every 5 years; moderate intensity exercise for up to 150 minutes weekly; and  sleep for at least 7 hours a day.  - she is  advised to maintain close follow up with Fayrene Helper, MD for primary care needs, as well as her other providers for optimal and coordinated care.  - Patient Care Time Today:  25 min, of which >50% was spent in  counseling and the rest reviewing her  current and  previous labs/studies, previous treatments, her blood glucose readings, and medications' doses and developing a plan for long-term care based on the latest recommendations for standards of care.   [name] participated in the discussions, expressed understanding, and voiced agreement with the above plans.  All questions were answered to her satisfaction. she is encouraged to contact clinic should she have any questions or concerns prior to her return visit.   Follow up plan: - Return in about 4 months (around 07/05/2019) for Follow up with Pre-visit Labs, Next Visit A1c in Office.  Glade Lloyd, MD [facility] [address] [place], [place] [address] Phone: [phone]  Fax: [phone]    03/06/2019, 4:39 PM  This note was partially dictated with voice recognition software. Similar sounding words can be transcribed inadequately or may not  be corrected upon review.

## 2019-03-06 NOTE — Progress Notes (Signed)
Medical Nutrition Therapy:  Appt start time:1530  end time:  [id number] Assessment:  Primary concerns today: Diabetes Type 2 for > 15 yrs. FBS 75-145 mg/dl.  To see Dr. Dorris Fetch today and get A1C done in office. Lost 9 lbs since last visit. Working on eating more fresh fruits and vegetables. Cutting out sweets and sugary foods and cut down on bread. 40 units Lantus. , Metformin 1000 mg BID. Glipizide.  Lab Results  Component Value Date   HGBA1C 8.2 (H) 10/29/2018    Wt Readings from Last 3 Encounters:  03/06/19 194 lb (88 kg)  12/25/18 203 lb (92.1 kg)  12/04/18 203 lb (92.1 kg)   Ht Readings from Last 3 Encounters:  03/06/19 5' 2.5" (1.588 m)  12/25/18 5' 2.5" (1.588 m)  12/04/18 5' 2.5" (1.588 m)     Vitals with BMI 11/14/2017  Height 5' 2.5"  Weight 207 lbs 10 oz  BMI [id number]  Systolic   Diastolic   Pulse   Respirations    Lab Results  Component Value Date   HGBA1C 8.2 (H) 10/29/2018   Lipid Panel     Component Value Date/Time   CHOL 189 07/06/2018 0832   TRIG 98 07/06/2018 0832   HDL 43 (L) 07/06/2018 0832   CHOLHDL 4.4 07/06/2018 0832   VLDL 20 07/22/2016 1004   LDLCALC 126 (H) 07/06/2018 0832   CMP Latest Ref Rng & Units 10/29/2018 07/06/2018 02/08/2018  Glucose 65 - 99 mg/dL 139(H) 327(H) 197(H)  BUN 7 - 25 mg/dL 11 12 10   Creatinine 0.60 - 0.93 mg/dL 0.67 0.86 0.84  Sodium 135 - 146 mmol/L 140 136 141  Potassium 3.5 - 5.3 mmol/L 4.3 4.5 4.3  Chloride 98 - 110 mmol/L 103 101 102  CO2 20 - 32 mmol/L 31 28 30   Calcium 8.6 - 10.4 mg/dL 9.4 9.4 9.9  Total Protein 6.1 - 8.1 g/dL 6.5 6.8 -  Total Bilirubin 0.2 - 1.2 mg/dL 0.4 0.4 -  Alkaline Phos 33 - 130 U/L - - -  AST 10 - 35 U/L 26 22 -  ALT 6 - 29 U/L 21 20 -      Preferred Learning Style:  Auditory  Visual  Hands on  Learning Readiness:   Ready  Change in progress   MEDICATIONS:   DIETARY INTAKE:    24-hr recall:  B ( AM): apricot 1/2 c L ( PM): [place] sandwich and lettuce/tomatoes,  water Snk ( PM):  D ( PM): String beans and baked chicken, water  nk ( PM): Beverages: water  Usual physical activity:  Chair exercises.   Estimated energy needs: 1500  calories 170 g carbohydrates 112 g protein 42 g fat  Progress Towards Goal(s):  In progress.   Nutritional Diagnosis:  NB-1.1 Food and nutrition-related knowledge deficit As related to Diabetes.  As evidenced by A1C  8.5%.     Intervention:  Nutrition and Diabetes education provided on My Plate, CHO counting, meal planning, portion sizes, timing of meals, avoiding snacks between meals unless having a low blood sugar, target ranges for A1C and blood sugars, signs/symptoms and treatment of hyper/hypoglycemia, monitoring blood sugars, taking medications as prescribed, benefits of exercising 30 minutes per day and prevention of complications of DM.   Breakfast:  1 egg and 2 slices toast and 2 slices bacon, water, hot  Tea Lunch: Hamburger with bun, water Dinner: salad -chicken, water   Goals Keep up the great job! Keep eating more lower carb vegetables Eat meals on  time Continue to walk as you can Get A1C 7% Lose 2-3 lbs per month  Teaching Method Utilized:  Visual Auditory Hands on  Handouts given during visit include:  The Plate Method  Meal Plan Card  Barriers to learning/adherence to lifestyle change: none  Demonstrated degree of understanding via:  Teach Back   Monitoring/Evaluation:  Dietary intake, exercise, , and body weight in 3 month(s).

## 2019-03-06 NOTE — Patient Instructions (Signed)

## 2019-03-06 NOTE — Patient Instructions (Signed)
  Goals Keep up the great job! Keep eating more lower carb vegetables Eat meals on time Continue to walk as you can Get A1C 7% Lose 2-3 lbs per month

## 2019-03-07 ENCOUNTER — Ambulatory Visit: Payer: Medicare HMO | Admitting: "Endocrinology

## 2019-03-07 ENCOUNTER — Encounter: Payer: Medicare HMO | Admitting: Nutrition

## 2019-03-08 HISTORY — PX: EYE SURGERY: SHX253

## 2019-03-14 ENCOUNTER — Other Ambulatory Visit: Payer: Self-pay | Admitting: Family Medicine

## 2019-03-14 DIAGNOSIS — I10 Essential (primary) hypertension: Secondary | ICD-10-CM

## 2019-04-08 ENCOUNTER — Other Ambulatory Visit: Payer: Self-pay | Admitting: Orthopedic Surgery

## 2019-04-08 DIAGNOSIS — G8929 Other chronic pain: Secondary | ICD-10-CM

## 2019-04-15 ENCOUNTER — Telehealth: Payer: Self-pay

## 2019-04-15 ENCOUNTER — Other Ambulatory Visit: Payer: Self-pay

## 2019-04-15 MED ORDER — AMLODIPINE BESYLATE 10 MG PO TABS: 10.0000 mg | ORAL_TABLET | Freq: Every day | ORAL | 1 refills | Status: DC

## 2019-04-15 MED ORDER — TRAZODONE HCL 100 MG PO TABS: 100.0000 mg | ORAL_TABLET | Freq: Every day | ORAL | 1 refills | Status: DC

## 2019-04-15 NOTE — Telephone Encounter (Signed)
Meds refilled.

## 2019-04-15 NOTE — Telephone Encounter (Signed)
Please send Amlodipine & Trazodone, to the drug store

## 2019-05-10 ENCOUNTER — Other Ambulatory Visit: Payer: Self-pay

## 2019-05-10 DIAGNOSIS — K219 Gastro-esophageal reflux disease without esophagitis: Secondary | ICD-10-CM

## 2019-05-10 DIAGNOSIS — M25512 Pain in left shoulder: Secondary | ICD-10-CM

## 2019-05-10 DIAGNOSIS — IMO0002 Reserved for concepts with insufficient information to code with codable children: Secondary | ICD-10-CM

## 2019-05-10 DIAGNOSIS — I10 Essential (primary) hypertension: Secondary | ICD-10-CM

## 2019-05-10 DIAGNOSIS — E1165 Type 2 diabetes mellitus with hyperglycemia: Secondary | ICD-10-CM

## 2019-05-10 DIAGNOSIS — J3089 Other allergic rhinitis: Secondary | ICD-10-CM

## 2019-05-10 DIAGNOSIS — G8929 Other chronic pain: Secondary | ICD-10-CM

## 2019-05-10 DIAGNOSIS — J45991 Cough variant asthma: Secondary | ICD-10-CM

## 2019-05-10 DIAGNOSIS — M5010 Cervical disc disorder with radiculopathy, unspecified cervical region: Secondary | ICD-10-CM

## 2019-05-10 MED ORDER — ATORVASTATIN CALCIUM 20 MG PO TABS: 20.0000 mg | ORAL_TABLET | Freq: Every day | ORAL | 3 refills | Status: DC

## 2019-05-10 MED ORDER — HYDRALAZINE HCL 25 MG PO TABS: 25.0000 mg | ORAL_TABLET | Freq: Three times a day (TID) | ORAL | 1 refills | Status: DC

## 2019-05-10 MED ORDER — PANTOPRAZOLE SODIUM 20 MG PO TBEC: 20.0000 mg | DELAYED_RELEASE_TABLET | Freq: Every day | ORAL | 1 refills | Status: DC

## 2019-05-10 MED ORDER — METFORMIN HCL 1000 MG PO TABS: ORAL_TABLET | ORAL | 1 refills | Status: DC

## 2019-05-10 MED ORDER — GLUCOSE BLOOD VI STRP: ORAL_STRIP | 5 refills | Status: DC

## 2019-05-10 MED ORDER — SPIRONOLACTONE 100 MG PO TABS: 100.0000 mg | ORAL_TABLET | Freq: Every day | ORAL | 1 refills | Status: DC

## 2019-05-10 MED ORDER — FLUTICASONE PROPIONATE 50 MCG/ACT NA SUSP: 2.0000 | Freq: Every day | NASAL | 1 refills | Status: DC

## 2019-05-10 MED ORDER — INSULIN PEN NEEDLE 31G X 5 MM MISC: 2 refills | Status: DC

## 2019-05-10 MED ORDER — GABAPENTIN 300 MG PO CAPS: 300.0000 mg | ORAL_CAPSULE | Freq: Every day | ORAL | 1 refills | Status: DC

## 2019-05-10 MED ORDER — GLIPIZIDE ER 10 MG PO TB24: ORAL_TABLET | ORAL | 1 refills | Status: DC

## 2019-05-10 MED ORDER — MONTELUKAST SODIUM 10 MG PO TABS: 10.0000 mg | ORAL_TABLET | Freq: Every day | ORAL | 1 refills | Status: DC

## 2019-05-20 ENCOUNTER — Ambulatory Visit: Payer: Medicare Other | Admitting: Orthopedic Surgery

## 2019-05-20 ENCOUNTER — Other Ambulatory Visit: Payer: Self-pay

## 2019-05-20 ENCOUNTER — Encounter: Payer: Self-pay | Admitting: Orthopedic Surgery

## 2019-05-20 DIAGNOSIS — M25512 Pain in left shoulder: Secondary | ICD-10-CM | POA: Diagnosis not present

## 2019-05-20 DIAGNOSIS — G8929 Other chronic pain: Secondary | ICD-10-CM

## 2019-05-20 MED ORDER — TRAMADOL-ACETAMINOPHEN 37.5-325 MG PO TABS: ORAL_TABLET | ORAL | 0 refills | Status: DC

## 2019-05-20 MED ORDER — NAPROXEN 500 MG PO TABS: 500.0000 mg | ORAL_TABLET | Freq: Two times a day (BID) | ORAL | 2 refills | Status: DC

## 2019-05-20 NOTE — Patient Instructions (Signed)
Take the medications as ordered   Continue the exercises   You have received an injection of steroids into the joint. 15% of patients will have increased pain within the 24 hours postinjection.   This is transient and will go away.   We recommend that you use ice packs on the injection site for 20 minutes every 2 hours and extra strength Tylenol 2 tablets every 8 as needed until the pain resolves.  If you continue to have pain after taking the Tylenol and using the ice please call the office for further instructions.

## 2019-05-20 NOTE — Progress Notes (Signed)
Chief Complaint  Patient presents with  . Shoulder Pain    left shoulder still very painful   . Medication Refill    Ultracet and Naproxen    74 year old female with a history of osteoarthritis of the left shoulder previously treated with injection home exercises naproxen and Ultracet presents with increasing pain in the left shoulder  Review of systems neurologic systems are negative musculoskeletal system negative for cervical spine disease  Past Medical History:  Diagnosis Date  . Arthritis   . Asthma 1963  . Chest pain with high risk for cardiac etiology 08/21/2017  . Diabetes mellitus, type 2 ([place]) 2004  . GERD (gastroesophageal reflux disease) 2004  . Hypertension 2004  . Low back pain   . Obesity   . RAD (reactive airway disease)   . Sciatica   . Seasonal allergies    BP (!) 147/84   Pulse 88   Ht 5\' 2"  (1.575 m)   Wt 192 lb (87.1 kg)   BMI 35.12 kg/m  Physical Exam Constitutional:      General: She is not in acute distress.    Appearance: Normal appearance. She is obese. She is not ill-appearing.  Skin:    General: Skin is warm.     Capillary Refill: Capillary refill takes less than 2 seconds.  Neurological:     General: No focal deficit present.     Mental Status: She is alert and oriented to person, place, and time.  Psychiatric:        Attention and Perception: Attention normal.        Mood and Affect: Mood is depressed. Affect is blunt.        Behavior: Behavior normal.     Left shoulder strength remains intact she has decreased external rotation compared to the right she is tender in the upper arm inferior to the posterior acromion and lateral deltoid Skin is clean dry and intact without rash Shoulder stable in abduction external rotation  X-ray taken last year showed a mild amount of glenohumeral arthritis  Impression Encounter Diagnosis  Name Primary?  . Chronic left shoulder pain    Management  Refill both medications and inject left  shoulder continue home exercises  Meds ordered this encounter  Medications  . traMADol-acetaminophen (ULTRACET) 37.5-325 MG tablet    Sig: TAKE (1) TABLET BY MOUTH EVERY FOUR HOURS AS NEEDED.    Dispense:  90 tablet    Refill:  0  . naproxen (NAPROSYN) 500 MG tablet    Sig: Take 1 tablet (500 mg total) by mouth 2 (two) times daily with a meal.    Dispense:  60 tablet    Refill:  2    Procedure note the subacromial injection shoulder left   Verbal consent was obtained to inject the  Left   Shoulder  Timeout was completed to confirm the injection site is a subacromial space of the  left  shoulder  Medication used Depo-Medrol 40 mg and lidocaine 1% 3 cc  Anesthesia was provided by ethyl chloride  The injection was performed in the left  posterior subacromial space. After pinning the skin with alcohol and anesthetized the skin with ethyl chloride the subacromial space was injected using a 20-gauge needle. There were no complications  Sterile dressing was applied.  F/u prn  Chronic with exacerbation, injection, prescription management.

## 2019-05-21 ENCOUNTER — Telehealth: Payer: Self-pay | Admitting: "Endocrinology

## 2019-05-21 DIAGNOSIS — E1159 Type 2 diabetes mellitus with other circulatory complications: Secondary | ICD-10-CM | POA: Diagnosis not present

## 2019-05-21 DIAGNOSIS — E782 Mixed hyperlipidemia: Secondary | ICD-10-CM | POA: Diagnosis not present

## 2019-05-21 NOTE — Telephone Encounter (Signed)
[facility] called and needs DX code for pt. She had labs done. [phone]

## 2019-05-21 NOTE — Telephone Encounter (Signed)
OK 

## 2019-05-21 NOTE — Telephone Encounter (Signed)
Called [facility] they stated pt had come in for lab work but the vitamin D did not have a diagnosis that would support it and insurance would not cover. Pt decided to decline the vitamin D level but did have her other labs drawn.

## 2019-05-22 LAB — COMPLETE METABOLIC PANEL WITH GFR
AG Ratio: 1.4 (calc) (ref 1.0–2.5)
ALT: 19 U/L (ref 6–29)
AST: 20 U/L (ref 10–35)
Albumin: 4 g/dL (ref 3.6–5.1)
Alkaline phosphatase (APISO): 86 U/L (ref 37–153)
BUN: 14 mg/dL (ref 7–25)
CO2: 32 mmol/L (ref 20–32)
Calcium: 9.6 mg/dL (ref 8.6–10.4)
Chloride: 101 mmol/L (ref 98–110)
Creat: 0.73 mg/dL (ref 0.60–0.93)
GFR, Est African American: 95 mL/min/{1.73_m2} (ref >=60)
GFR, Est Non African American: 82 mL/min/{1.73_m2} (ref >=60)
Globulin: 2.8 g/dL (calc) (ref 1.9–3.7)
Glucose, Bld: 178 mg/dL — ABNORMAL HIGH (ref 65–99)
Potassium: 4.8 mmol/L (ref 3.5–5.3)
Sodium: 139 mmol/L (ref 135–146)
Total Bilirubin: 0.3 mg/dL (ref 0.2–1.2)
Total Protein: 6.8 g/dL (ref 6.1–8.1)

## 2019-05-22 LAB — LIPID PANEL
Cholesterol: 182 mg/dL (ref <200)
HDL: 61 mg/dL (ref >=50)
LDL Cholesterol (Calc): 107 mg/dL (calc) — ABNORMAL HIGH
Non-HDL Cholesterol (Calc): 121 mg/dL (calc) (ref <130)
Total CHOL/HDL Ratio: 3 (calc) (ref <5.0)
Triglycerides: 48 mg/dL (ref <150)

## 2019-05-22 LAB — MICROALBUMIN / CREATININE URINE RATIO
Creatinine, Urine: 100 mg/dL (ref 20–275)
Microalb Creat Ratio: 5 mcg/mg creat (ref <30)
Microalb, Ur: 0.5 mg/dL

## 2019-05-22 LAB — TSH: TSH: 2.58 mIU/L (ref 0.40–4.50)

## 2019-05-22 LAB — T4, FREE: Free T4: 0.9 ng/dL (ref 0.8–1.8)

## 2019-05-22 LAB — ADVANCED WRITTEN NOTIFICATION (AWN) TEST REFUSAL

## 2019-05-27 ENCOUNTER — Other Ambulatory Visit: Payer: Self-pay | Admitting: *Deleted

## 2019-05-27 MED ORDER — AMLODIPINE BESYLATE 10 MG PO TABS: 10.0000 mg | ORAL_TABLET | Freq: Every day | ORAL | 1 refills | Status: DC

## 2019-05-28 ENCOUNTER — Ambulatory Visit: Payer: Medicare HMO | Admitting: Family Medicine

## 2019-06-27 ENCOUNTER — Other Ambulatory Visit: Payer: Self-pay | Admitting: *Deleted

## 2019-06-27 MED ORDER — INSULIN PEN NEEDLE 31G X 5 MM MISC: 2 refills | Status: DC

## 2019-07-10 ENCOUNTER — Encounter: Payer: Medicare Other | Attending: "Endocrinology | Admitting: Nutrition

## 2019-07-10 ENCOUNTER — Encounter: Payer: Self-pay | Admitting: Nutrition

## 2019-07-10 ENCOUNTER — Ambulatory Visit (INDEPENDENT_AMBULATORY_CARE_PROVIDER_SITE_OTHER): Payer: Medicare Other | Admitting: "Endocrinology

## 2019-07-10 ENCOUNTER — Encounter: Payer: Self-pay | Admitting: "Endocrinology

## 2019-07-10 ENCOUNTER — Other Ambulatory Visit: Payer: Self-pay

## 2019-07-10 VITALS — BP 136/84 | HR 102 | Ht 62.0 in | Wt 191.6 lb

## 2019-07-10 VITALS — Ht 62.5 in | Wt 191.8 lb

## 2019-07-10 DIAGNOSIS — I10 Essential (primary) hypertension: Secondary | ICD-10-CM

## 2019-07-10 DIAGNOSIS — E1159 Type 2 diabetes mellitus with other circulatory complications: Secondary | ICD-10-CM

## 2019-07-10 DIAGNOSIS — E669 Obesity, unspecified: Secondary | ICD-10-CM | POA: Diagnosis present

## 2019-07-10 DIAGNOSIS — E782 Mixed hyperlipidemia: Secondary | ICD-10-CM

## 2019-07-10 LAB — POCT GLYCOSYLATED HEMOGLOBIN (HGB A1C): Hemoglobin A1C: 7.5 % — AB (ref 4.0–5.6)

## 2019-07-10 MED ORDER — LANTUS SOLOSTAR 100 UNIT/ML ~~LOC~~ SOPN: 40.0000 [IU] | PEN_INJECTOR | Freq: Every day | SUBCUTANEOUS | 2 refills | Status: DC

## 2019-07-10 MED ORDER — GLUCOSE BLOOD VI STRP: ORAL_STRIP | 5 refills | Status: DC

## 2019-07-10 NOTE — Progress Notes (Signed)
Medical Nutrition Therapy:  Appt start time:1530  end time:  1600 Assessment:  Primary concerns today: Diabetes Type 2 for > 15 yrs. Lost her husband in January 2021. To see Dr. Dorris Fetch today and get A1C . Hasn't taken her insulin for 2 weeks because she didn't get her insuiln pen needles from insurance company. Gets prescriptions throuigh mail order . Changed insurance from [place] to  [place] [employer]. FBS 90-185 mg/dl. Will see Dr. Dorris Fetch today and will get prescription for her pen needles. Sample pen needles were given for about 14 days until her mail order sends them. A1C 7.5%. Has tooth ache and needs to see dentist.  Currently on 40 units of Lantus daily and Metformin 1000 mg BID and Glipizide. She is trying to eat healthier. Has family members in the house staying with her at times and sometimes doesn't have the right foods to eat.  Lab Results  Component Value Date   HGBA1C 7.5 (A) 07/10/2019     Wt Readings from Last 3 Encounters:  05/20/19 192 lb (87.1 kg)  03/06/19 194 lb (88 kg)  03/06/19 194 lb (88 kg)   Ht Readings from Last 3 Encounters:  05/20/19 5\' 2"  (1.575 m)  03/06/19 5\' 2"  (1.575 m)  03/06/19 5' 2.5" (1.588 m)     Vitals with BMI 11/14/2017  Height 5' 2.5"  Weight 207 lbs 10 oz  BMI [id number]  Systolic   Diastolic   Pulse   Respirations    Lab Results  Component Value Date   HGBA1C 7.2 (A) 03/06/2019   Lipid Panel     Component Value Date/Time   CHOL 182 05/21/2019 0929   TRIG 48 05/21/2019 0929   HDL 61 05/21/2019 0929   CHOLHDL 3.0 05/21/2019 0929   VLDL 20 07/22/2016 1004   LDLCALC 107 (H) 05/21/2019 0929   CMP Latest Ref Rng & Units 05/21/2019 10/29/2018 07/06/2018  Glucose 65 - 99 mg/dL 178(H) 139(H) 327(H)  BUN 7 - 25 mg/dL 14 11 12   Creatinine 0.60 - 0.93 mg/dL 0.73 0.67 0.86  Sodium 135 - 146 mmol/L 139 140 136  Potassium 3.5 - 5.3 mmol/L 4.8 4.3 4.5  Chloride 98 - 110 mmol/L 101 103 101  CO2 20 - 32 mmol/L 32 31 28  Calcium 8.6 - 10.4  mg/dL 9.6 9.4 9.4  Total Protein 6.1 - 8.1 g/dL 6.8 6.5 6.8  Total Bilirubin 0.2 - 1.2 mg/dL 0.3 0.4 0.4  Alkaline Phos 33 - 130 U/L - - -  AST 10 - 35 U/L 20 26 22   ALT 6 - 29 U/L 19 21 20    Preferred Learning Style:  Auditory  Visual  Hands on  Learning Readiness:   Ready  Change in progress   MEDICATIONS:   DIETARY INTAKE:    24-hr recall:  B ( AM): skipped, hot dog, 1 slice pita bread, and 1 egg and water L ( PM): Toss- lettuce/spinach   Snk ( PM):  D ( PM): String beans and baked chicken, water  nk ( PM): Beverages: water  Usual physical activity:  Chair exercises.   Estimated energy needs: 1500  calories 170 g carbohydrates 112 g protein 42 g fat  Progress Towards Goal(s):  In progress.   Nutritional Diagnosis:  NB-1.1 Food and nutrition-related knowledge deficit As related to Diabetes.  As evidenced by A1C  8.5%.     Intervention:  Nutrition and Diabetes education provided on My Plate, CHO counting, meal planning, portion sizes, timing of meals, avoiding  snacks between meals unless having a low blood sugar, target ranges for A1C and blood sugars, signs/symptoms and treatment of hyper/hypoglycemia, monitoring blood sugars, taking medications as prescribed, benefits of exercising 30 minutes per day and prevention of complications of DM.   Goals Eat breakfast daily. Don't skip meals. Keep eating more lower carb vegetables Eat meals on time Continue to walk as you can Get A1C 7% Lose 2-3 lbs per month Test blood sugars twice a day; in am and before bed.  Teaching Method Utilized:  Visual Auditory Hands on  Handouts given during visit include:  The Plate Method  Meal Plan Card  Barriers to learning/adherence to lifestyle change: none  Demonstrated degree of understanding via:  Teach Back   Monitoring/Evaluation:  Dietary intake, exercise, , and body weight in 3 month(s). Recommend to get referral for grief counseling and to treat possible  depression.

## 2019-07-10 NOTE — Patient Instructions (Signed)

## 2019-07-10 NOTE — Patient Instructions (Signed)
Goals Eat breakfast daily. Don't skip meals. Keep eating more lower carb vegetables Eat meals on time Continue to walk as you can Get A1C 7% Lose 2-3 lbs per month Test blood sugars twice a day; in am and before bed.

## 2019-07-10 NOTE — Progress Notes (Signed)
07/10/2019, 4:51 PM  Endocrinology follow-up note  Subjective:    Patient ID: Tiffany Jacobs, female    DOB: 03-17-1945.  Tiffany Jacobs is being seen for follow-up of currently uncontrolled type 2 diabetes. PMD:  Fayrene Helper, MD.   Past Medical History:  Diagnosis Date  . Arthritis   . Asthma 1963  . Chest pain with high risk for cardiac etiology 08/21/2017  . Diabetes mellitus, type 2 ([place]) 2004  . GERD (gastroesophageal reflux disease) 2004  . Hypertension 2004  . Low back pain   . Obesity   . RAD (reactive airway disease)   . Sciatica   . Seasonal allergies     Past Surgical History:  Procedure Laterality Date  . ACROMIO-CLAVICULAR JOINT REPAIR Right 11/09/2012   Procedure: ACROMIO-CLAVICULAR JOINT REPAIR;  Surgeon: Carole Civil, MD;  Location: [facility];  Service: Orthopedics;  Laterality: Right;  . COLONOSCOPY N/A 10/10/2016   Procedure: COLONOSCOPY;  Surgeon: Danie Binder, MD;  Location: [facility];  Service: Endoscopy;  Laterality: N/A;  10:30  . EYE SURGERY Right 05/09/2018   cataract  . left breast biopsy for benign disease    . SHOULDER ACROMIOPLASTY Right 11/09/2012   Procedure: RIGHT SHOULDER ACROMIOPLASTY;  Surgeon: Carole Civil, MD;  Location: [facility];  Service: Orthopedics;  Laterality: Right;  . SHOULDER OPEN ROTATOR CUFF REPAIR Right 11/09/2012   Procedure: ROTATOR CUFF REPAIR Right SHOULDER OPEN;  Surgeon: Carole Civil, MD;  Location: [facility];  Service: Orthopedics;  Laterality: Right;  . total knee arthroplasty left  02-01-05   Dr. Aline Brochure  . TUBAL LIGATION  1970    Social History   Socioeconomic History  . Marital status: Married    Spouse name: Tiffany Jacobs   . Number of children: 7  . Years of education: 8  . Highest education level: GED or equivalent  Occupational History  . Occupation: disabled   Tobacco Use  . Smoking status: Former Smoker    Packs/day: 0.50    Years: 37.00    Pack  years: 18.50    Types: Cigarettes    Quit date: 10/12/1988    Years since quitting: 30.7  . Smokeless tobacco: Never Used  Substance and Sexual Activity  . Alcohol use: No  . Drug use: No  . Sexual activity: Not Currently    Birth control/protection: Post-menopausal  Other Topics Concern  . Not on file  Social History Narrative  . Not on file   Social Determinants of Health   Financial Resource Strain:   . Difficulty of Paying Living Expenses:   Food Insecurity:   . Worried About Charity fundraiser in the Last Year:   . Arboriculturist in the Last Year:   Transportation Needs:   . Film/video editor (Medical):   Marland Kitchen Lack of Transportation (Non-Medical):   Physical Activity:   . Days of Exercise per Week:   . Minutes of Exercise per Session:   Stress:   . Feeling of Stress :   Social Connections:   . Frequency of Communication with Friends and Family:   . Frequency of Social Gatherings with Friends and Family:   . Attends Religious Services:   . Active Member of Clubs or Organizations:   . Attends Archivist Meetings:   Marland Kitchen Marital Status:     Family History  Problem Relation Age of Onset  . Kidney failure Father   .  Kidney disease Father   . Stroke Brother 36       used drugs , stroke and heart disease  . Diabetes Brother   . Hypertension Brother   . Cancer Daughter 38       breast   . Diabetes Mother   . Asthma Other   . Lung disease Other   . Cancer Other   . Heart disease Other   . Arthritis Other   . Colon cancer Neg Hx   . Colon polyps Neg Hx     Outpatient Encounter Medications as of 07/10/2019  Medication Sig  . albuterol (PROVENTIL) (2.5 MG/3ML) 0.083% nebulizer solution Take 2.5 mg by nebulization every 6 (six) hours as needed for wheezing or shortness of breath.  Marland Kitchen albuterol (VENTOLIN HFA) 108 (90 Base) MCG/ACT inhaler Inhale 2 puffs into the lungs every 6 (six) hours as needed for wheezing or shortness of breath.  Marland Kitchen amLODipine (NORVASC)  10 MG tablet Take 1 tablet (10 mg total) by mouth daily.  Marland Kitchen atorvastatin (LIPITOR) 20 MG tablet Take 1 tablet (20 mg total) by mouth daily. Discontinue pravastatin effective 07/26/2018  . blood glucose meter kit and supplies Two times daily testing (dispense meter based on insurance preference) dx e11.65  . fluticasone (FLONASE) 50 MCG/ACT nasal spray Place 2 sprays into both nostrils daily.  Marland Kitchen gabapentin (NEURONTIN) 300 MG capsule Take 1 capsule (300 mg total) by mouth at bedtime.  Marland Kitchen glipiZIDE (GLUCOTROL XL) 10 MG 24 hr tablet TAKE 2 TABLETS EVERY DAY WITH BREAKFAST  . glucose blood test strip Use as instructed once daily dx E11.9  . hydrALAZINE (APRESOLINE) 25 MG tablet Take 1 tablet (25 mg total) by mouth 3 (three) times daily.  . insulin glargine (LANTUS SOLOSTAR) 100 UNIT/ML Solostar Pen Inject 40 Units into the skin at bedtime.  . Insulin Pen Needle 31G X 5 MM MISC To be used with Lantus insulin pen. Dispensed based on patient's insurance coverage. Please make sure these fit on the patients lantus pen  . metFORMIN (GLUCOPHAGE) 1000 MG tablet TAKE 1 TABLET TWICE DAILY WITH A MEAL  . metoprolol tartrate (LOPRESSOR) 25 MG tablet Take 25 mg by mouth 2 (two) times daily.  . montelukast (SINGULAIR) 10 MG tablet Take 1 tablet (10 mg total) by mouth at bedtime.  . naproxen (NAPROSYN) 500 MG tablet Take 1 tablet (500 mg total) by mouth 2 (two) times daily with a meal.  . nitroGLYCERIN (NITROSTAT) 0.4 MG SL tablet DISSOLVE ONE TABLET UNDER THE TONGUE EVERY 5 MINUTES AS NEEDED FOR CHEST PAIN. DO NOT EXCEED A TOTAL OF 3 DOSES IN 15 MINUTES  . pantoprazole (PROTONIX) 20 MG tablet Take 1 tablet (20 mg total) by mouth daily.  Marland Kitchen spironolactone (ALDACTONE) 100 MG tablet Take 1 tablet (100 mg total) by mouth daily.  . traMADol-acetaminophen (ULTRACET) 37.5-325 MG tablet TAKE (1) TABLET BY MOUTH EVERY FOUR HOURS AS NEEDED.  Marland Kitchen traZODone (DESYREL) 100 MG tablet Take 1 tablet (100 mg total) by mouth at bedtime.   . TRUEplus Lancets 33G MISC TEST TWO TIMES DAILY  . [DISCONTINUED] glucose blood test strip Use as instructed once daily dx E11.9  . [DISCONTINUED] Insulin Glargine (LANTUS SOLOSTAR) 100 UNIT/ML Solostar Pen Inject 40 Units into the skin at bedtime.   No facility-administered encounter medications on file as of 07/10/2019.    ALLERGIES: Allergies  Allergen Reactions  . Ace Inhibitors Cough  . Latex Itching and Rash    VACCINATION STATUS: Immunization History  Administered Date(s)  Administered  . Fluad Quad(high Dose 65+) 12/04/2018  . Influenza Whole 03/29/2006, 11/27/2009  . Influenza,inj,Quad PF,6+ Mos 03/20/2013, 01/24/2014, 12/25/2014, 01/22/2016, 10/31/2016, 01/31/2017, 10/19/2017  . Pneumococcal Conjugate-13 06/06/2014  . Pneumococcal Polysaccharide-23 08/27/2004, 11/27/2009, 08/27/2015  . Td 08/27/2004    Diabetes She presents for her follow-up diabetic visit. She has type 2 diabetes mellitus. Onset time: She was diagnosed at approximate age of 44. Her disease course has been stable. There are no hypoglycemic associated symptoms. Pertinent negatives for hypoglycemia include no confusion, headaches, pallor or seizures. Associated symptoms include fatigue. Pertinent negatives for diabetes include no chest pain, no polydipsia, no polyphagia and no polyuria. There are no hypoglycemic complications. Symptoms are stable. There are no diabetic complications. Risk factors for coronary artery disease include dyslipidemia, diabetes mellitus, obesity, sedentary lifestyle, post-menopausal and family history. Current diabetic treatment includes insulin injections and oral agent (monotherapy). She is compliant with treatment most of the time. Her weight is fluctuating minimally. She is following a generally unhealthy diet. When asked about meal planning, she reported none. She has had a previous visit with a dietitian. She never participates in exercise. Her home blood glucose trend is fluctuating  minimally. Her breakfast blood glucose range is generally 130-140 mg/dl. Her overall blood glucose range is 130-140 mg/dl. (She presents with controlled fasting glycemic profile.  Her point-of-care A1c 7.5%, overall improving from 11.4%.   No documented hypoglycemia.) An ACE inhibitor/angiotensin II receptor blocker is not being taken. Eye exam is current.     Review of systems  Constitutional: + Minimally fluctuating body weight,  current  Body mass index is 35.04 kg/m. , no fatigue, no subjective hyperthermia, no subjective hypothermia Eyes: no blurry vision, no xerophthalmia ENT: no sore throat, no nodules palpated in throat, no dysphagia/odynophagia, no hoarseness Cardiovascular: no Chest Pain, no Shortness of Breath, no palpitations, no leg swelling Respiratory: no cough, no shortness of breath Gastrointestinal: no Nausea/Vomiting/Diarhhea Musculoskeletal: no muscle/joint aches Skin: no rashes, no hyperemia Neurological: no tremors, no numbness, no tingling, no dizziness Psychiatric: no depression, no anxiety    Objective:    BP 136/84   Pulse (!) 102   Ht '5\' 2"'  (1.575 m)   Wt 191 lb 9.6 oz (86.9 kg)   BMI 35.04 kg/m   Wt Readings from Last 3 Encounters:  07/10/19 191 lb 12.8 oz (87 kg)  07/10/19 191 lb 9.6 oz (86.9 kg)  05/20/19 192 lb (87.1 kg)      Physical Exam- Limited  Constitutional:  Body mass index is 35.04 kg/m. , not in acute distress, normal state of mind Eyes:  EOMI, no exophthalmos Neck: Supple Thyroid: No gross goiter Respiratory: Adequate breathing efforts Musculoskeletal: no gross deformities, strength intact in all four extremities, no gross restriction of joint movements Skin:  no rashes, no hyperemia Neurological: no tremor with outstretched hands,   CMP     Component Value Date/Time   NA 139 05/21/2019 0929   K 4.8 05/21/2019 0929   CL 101 05/21/2019 0929   CO2 32 05/21/2019 0929   GLUCOSE 178 (H) 05/21/2019 0929   BUN 14 05/21/2019  0929   CREATININE 0.73 05/21/2019 0929   CALCIUM 9.6 05/21/2019 0929   PROT 6.8 05/21/2019 0929   ALBUMIN 4.1 10/24/2016 1257   AST 20 05/21/2019 0929   ALT 19 05/21/2019 0929   ALKPHOS 79 10/24/2016 1257   BILITOT 0.3 05/21/2019 0929   GFRNONAA 82 05/21/2019 0929   GFRAA 95 05/21/2019 0929     Diabetic Labs (  most recent): Lab Results  Component Value Date   HGBA1C 7.5 (A) 07/10/2019   HGBA1C 7.2 (A) 03/06/2019   HGBA1C 8.2 (H) 10/29/2018     Lipid Panel ( most recent) Lipid Panel     Component Value Date/Time   CHOL 182 05/21/2019 0929   TRIG 48 05/21/2019 0929   HDL 61 05/21/2019 0929   CHOLHDL 3.0 05/21/2019 0929   VLDL 20 07/22/2016 1004   LDLCALC 107 (H) 05/21/2019 0929      Lab Results  Component Value Date   TSH 2.58 05/21/2019   TSH 1.71 01/31/2017   TSH 2.75 05/26/2015   TSH 3.261 06/02/2014   TSH 3.039 05/07/2013   TSH 3.308 10/10/2011   TSH 2.811 03/19/2010   TSH 3.422 08/12/2009   FREET4 0.9 05/21/2019       Assessment & Plan:   1. Type 2 diabetes mellitus with vascular disease ([place]) - Karrine L Getchell has currently uncontrolled symptomatic type 2 DM since  74 years of age.  She presents with controlled fasting glycemic profile.  Her point-of-care A1c 7.5%, overall improving from 11.4%.   No documented hypoglycemia.  Recent labs reviewed.  -her diabetes is complicated by obesity/sedentary life and she remains at a high risk for more acute and chronic complications which include CAD, CVA, CKD, retinopathy, and neuropathy. These are all discussed in detail with her.  - I have counseled her on diet management and weight loss, by adopting a carbohydrate restricted/protein rich diet.  - she  admits there is a room for improvement in her diet and drink choices. -  Suggestion is made for her to avoid simple carbohydrates  from her diet including Cakes, Sweet Desserts / Pastries, Ice Cream, Soda (diet and regular), Sweet Tea, Candies, Chips,  Cookies, Sweet Pastries,  Store Bought Juices, Alcohol in Excess of  1-2 drinks a day, Artificial Sweeteners, Coffee Creamer, and "Sugar-free" Products. This will help patient to have stable blood glucose profile and potentially avoid unintended weight gain.   - I encouraged her to switch to  unprocessed or minimally processed complex starch and increased protein intake (animal or plant source), fruits, and vegetables.  - she is advised to stick to a routine mealtimes to eat 3 meals  a day and avoid unnecessary snacks ( to snack only to correct hypoglycemia).   - she is following with Jearld Fenton, RDN, CDE for individualized diabetes education.  - I have approached her with the following individualized plan to manage diabetes and patient agrees:   -Based on her current glycemic response, she will not need prandial insulin for now. -She worries about hypoglycemia, will attempt to avoid any hypoglycemia for this patient.   -She is comfortable with Lantus, will continue Lantus  40 units nightly, associated with monitoring her blood glucose-daily before breakfast and at bedtime.    - she is warned not to take insulin without proper monitoring per orders. - she is encouraged to call clinic for blood glucose levels less than 70 or above 300 mg /dl. - she is advised to continue metformin 1000 mg twice a day, glipizide  10 mg XL only at breakfast.  - Patient specific target  A1c;  LDL, HDL, Triglycerides, and  Waist Circumference were discussed in detail.  2) Blood Pressure /Hypertension:  Her blood pressure is controlled to target.   she is advised to continue her current medications including amlodipine , spironolactone daily with breakfast .  3) Lipids/Hyperlipidemia:   Review of her recent  lipid panel showed  uncontrolled  LDL at 126 .  she  is advised to continue atorvastatin 20 mg p.o. daily at bedtime.  Side effects and precautions discussed with her.  4)  Weight/Diet: Her BMI 35-    clearly complicating her diabetes care.  I discussed with her the fact that loss of 5 - 10% of her  current body weight will have the most impact on her diabetes management.  CDE Consult will be initiated . Exercise, and detailed carbohydrates information provided  -  detailed on discharge instructions.  5) Chronic Care/Health Maintenance:  -she  is on Statin medications and  is encouraged to initiate and continue to follow up with Ophthalmology, Dentist,  Podiatrist at least yearly or according to recommendations, and advised to  stay away from smoking. I have recommended yearly flu vaccine and pneumonia vaccine at least every 5 years; moderate intensity exercise for up to 150 minutes weekly; and  sleep for at least 7 hours a day.  - she is  advised to maintain close follow up with Fayrene Helper, MD for primary care needs, as well as her other providers for optimal and coordinated care.  - Time spent on this patient care encounter:  35 min, of which > 50% was spent in  counseling and the rest reviewing her blood glucose logs , discussing her hypoglycemia and hyperglycemia episodes, reviewing her current and  previous labs / studies  ( including abstraction from other facilities) and medications  doses and developing a  long term treatment plan and documenting her care.   Please refer to Patient Instructions for Blood Glucose Monitoring and Insulin/Medications Dosing Guide"  in media tab for additional information. Please  also refer to " Patient Self Inventory" in the Media  tab for reviewed elements of pertinent patient history.  Tiffany Jacobs participated in the discussions, expressed understanding, and voiced agreement with the above plans.  All questions were answered to her satisfaction. she is encouraged to contact clinic should she have any questions or concerns prior to her return visit.   Follow up plan: - Return in about 4 months (around 11/10/2019) for Bring Meter and Logs- A1c  in Office.  Glade Lloyd, MD [facility] [address] [place], [place] [address] Phone: [phone]  Fax: [phone]    07/10/2019, 4:51 PM  This note was partially dictated with voice recognition software. Similar sounding words can be transcribed inadequately or may not  be corrected upon review.

## 2019-07-24 DIAGNOSIS — H401121 Primary open-angle glaucoma, left eye, mild stage: Secondary | ICD-10-CM | POA: Diagnosis not present

## 2019-08-12 ENCOUNTER — Encounter: Payer: Self-pay | Admitting: Gastroenterology

## 2019-09-02 ENCOUNTER — Other Ambulatory Visit: Payer: Self-pay | Admitting: *Deleted

## 2019-09-02 ENCOUNTER — Telehealth: Payer: Self-pay

## 2019-09-02 DIAGNOSIS — E1169 Type 2 diabetes mellitus with other specified complication: Secondary | ICD-10-CM

## 2019-09-02 MED ORDER — BLOOD GLUCOSE METER KIT: PACK | 0 refills | Status: AC

## 2019-09-02 MED ORDER — TRUEPLUS LANCETS 33G MISC: 0 refills | Status: DC

## 2019-09-02 MED ORDER — INSULIN PEN NEEDLE 31G X 5 MM MISC: 2 refills | Status: DC

## 2019-09-02 MED ORDER — LANTUS SOLOSTAR 100 UNIT/ML ~~LOC~~ SOPN: 40.0000 [IU] | PEN_INJECTOR | Freq: Every day | SUBCUTANEOUS | 2 refills | Status: DC

## 2019-09-02 NOTE — Telephone Encounter (Signed)
Patient needs all her diabetic supplies ordered from [facility] - she said this is still not correct and has not had a supply shipped since March.

## 2019-09-02 NOTE — Telephone Encounter (Signed)
This was sent to [facility] order for pt

## 2019-09-06 ENCOUNTER — Other Ambulatory Visit (HOSPITAL_COMMUNITY): Payer: Self-pay | Admitting: Family Medicine

## 2019-09-06 DIAGNOSIS — Z1231 Encounter for screening mammogram for malignant neoplasm of breast: Secondary | ICD-10-CM

## 2019-09-17 ENCOUNTER — Other Ambulatory Visit: Payer: Self-pay

## 2019-09-17 ENCOUNTER — Ambulatory Visit (INDEPENDENT_AMBULATORY_CARE_PROVIDER_SITE_OTHER): Payer: Medicare Other | Admitting: Family Medicine

## 2019-09-17 ENCOUNTER — Encounter: Payer: Self-pay | Admitting: Family Medicine

## 2019-09-17 VITALS — BP 122/69 | HR 93 | Resp 16 | Ht 62.0 in | Wt 195.0 lb

## 2019-09-17 DIAGNOSIS — I10 Essential (primary) hypertension: Secondary | ICD-10-CM

## 2019-09-17 DIAGNOSIS — E559 Vitamin D deficiency, unspecified: Secondary | ICD-10-CM

## 2019-09-17 DIAGNOSIS — Z7189 Other specified counseling: Secondary | ICD-10-CM

## 2019-09-17 DIAGNOSIS — J3089 Other allergic rhinitis: Secondary | ICD-10-CM

## 2019-09-17 DIAGNOSIS — Z1231 Encounter for screening mammogram for malignant neoplasm of breast: Secondary | ICD-10-CM

## 2019-09-17 DIAGNOSIS — E782 Mixed hyperlipidemia: Secondary | ICD-10-CM

## 2019-09-17 DIAGNOSIS — E1159 Type 2 diabetes mellitus with other circulatory complications: Secondary | ICD-10-CM | POA: Diagnosis not present

## 2019-09-17 DIAGNOSIS — F5101 Primary insomnia: Secondary | ICD-10-CM

## 2019-09-17 NOTE — Patient Instructions (Addendum)
annual physical exam with MD in office in October, call if you need me before   CONGRATS on ex cellent blood sugar  Blood pressure is excellent   Fasting lipid, cmp and EGFR, cBC and vit D 5 days before next visit  Nurse will sort out your pharmacy options for mail in  Please reconsider covid vaccine  Thanks for choosing [facility], we consider it a privelige to serve you. It is important that you exercise regularly at least 30 minutes 5 times a week. If you develop chest pain, have severe difficulty breathing, or feel very tired, stop exercising immediately and seek medical attention

## 2019-09-19 ENCOUNTER — Telehealth: Payer: Self-pay

## 2019-09-19 NOTE — Telephone Encounter (Signed)
Called patients insurance company to see what options patient had in regards to pharmacies. Her mail order pharmacy is [facility] and she can still use Psychiatrist as a Management consultant for short term medications.

## 2019-09-20 ENCOUNTER — Encounter: Payer: Self-pay | Admitting: Family Medicine

## 2019-09-20 NOTE — Assessment & Plan Note (Signed)
Controlled, no change in medication DASH diet and commitment to daily physical activity for a minimum of 30 minutes discussed and encouraged, as a part of hypertension management. The importance of attaining a healthy weight is also discussed.  BP/Weight 09/17/2019 07/10/2019 07/10/2019 05/20/2019 03/06/2019 03/06/2019 27/67/0110  Systolic BP 034 - 961 164 353 - 912  Diastolic BP 69 - 84 84 77 - 70  Wt. (Lbs) 195 191.8 191.6 192 194 194 203  BMI 35.67 34.52 35.04 35.12 35.48 34.92 36.54

## 2019-09-20 NOTE — Assessment & Plan Note (Signed)
Sleep hygiene reviewed and written information offered also. Prescription sent for  medication needed.  

## 2019-09-20 NOTE — Assessment & Plan Note (Signed)
Controlled, no change in medication  

## 2019-09-20 NOTE — Assessment & Plan Note (Signed)
Tiffany Jacobs is reminded of the importance of commitment to daily physical activity for 30 minutes or more, as able and the need to limit carbohydrate intake to 30 to 60 grams per meal to help with blood sugar control.   The need to take medication as prescribed, test blood sugar as directed, and to call between visits if there is a concern that blood sugar is uncontrolled is also discussed.   Tiffany Jacobs is reminded of the importance of daily foot exam, annual eye examination, and good blood sugar, blood pressure and cholesterol control. Managed by Endo, improved, she is applauded on this  Diabetic Labs Latest Ref Rng & Units 07/10/2019 05/21/2019 03/06/2019 10/29/2018 07/06/2018  HbA1c 4.0 - 5.6 % 7.5(A) - 7.2(A) 8.2(H) 11.4(H)  Microalbumin mg/dL - 0.5 - - -  Micro/Creat Ratio <30 mcg/mg creat - 5 - - -  Chol <200 mg/dL - 182 - - 189  HDL > OR = 50 mg/dL - 61 - - 43(L)  Calc LDL mg/dL (calc) - 107(H) - - 126(H)  Triglycerides <150 mg/dL - 48 - - 98  Creatinine 0.60 - 0.93 mg/dL - 0.73 - 0.67 0.86   BP/Weight 09/17/2019 07/10/2019 07/10/2019 05/20/2019 03/06/2019 03/06/2019 94/70/9628  Systolic BP 366 - 294 765 465 - 035  Diastolic BP 69 - 84 84 77 - 70  Wt. (Lbs) 195 191.8 191.6 192 194 194 203  BMI 35.67 34.52 35.04 35.12 35.48 34.92 36.54   Foot/eye exam completion dates Latest Ref Rng & Units 12/04/2018 02/14/2018  Eye Exam No Retinopathy - -  Foot exam Order - - -  Foot Form Completion - Done Done

## 2019-09-20 NOTE — Progress Notes (Signed)
Tiffany Jacobs     MRN: [id number]      DOB: 1945-03-20   HPI Tiffany Jacobs is here for follow up and re-evaluation of chronic medical conditions, medication management and review of any available recent lab and radiology data.  Preventive health is updated, specifically  Cancer screening and Immunization.   Questions or concerns regarding consultations or procedures which the PT has had in the interim are  addressed. The PT denies any adverse reactions to current medications since the last visit.  There are no new concerns.  There are no specific complaints  Denies polyuria, polydipsia, blurred vision , or hypoglycemic episodes. Blood sugar is improved greatly  ROS Denies recent fever or chills. Denies sinus pressure, nasal congestion, ear pain or sore throat. Denies chest congestion, productive cough or wheezing. Denies chest pains, palpitations and leg swelling Denies abdominal pain, nausea, vomiting,diarrhea or constipation.   Denies dysuria, frequency, hesitancy or incontinence. Denies joint pain, swelling and limitation in mobility. Denies headaches, seizures, numbness, or tingling. Denies depression, anxiety or uncontrolled  insomnia. Denies skin break down or rash.   PE  BP 122/69   Pulse 93   Resp 16   Ht 5\' 2"  (1.575 m)   Wt 195 lb (88.5 kg)   SpO2 96%   BMI 35.67 kg/m   Patient alert and oriented and in no cardiopulmonary distress.  HEENT: No facial asymmetry, EOMI,     Neck supple .  Chest: Clear to auscultation bilaterally.  CVS: S1, S2 no murmurs, no S3.Regular rate.  ABD: Soft non tender.   Ext: No edema  MS: Adequate ROM spine, shoulders, hips and knees.  Skin: Intact, no ulcerations or rash noted.  Psych: Good eye contact, normal affect. Memory intact not anxious or depressed appearing.  CNS: CN 2-12 intact, power,  normal throughout.no focal deficits noted.   Assessment & Plan  Essential hypertension Controlled, no change in  medication DASH diet and commitment to daily physical activity for a minimum of 30 minutes discussed and encouraged, as a part of hypertension management. The importance of attaining a healthy weight is also discussed.  BP/Weight 09/17/2019 07/10/2019 07/10/2019 05/20/2019 03/06/2019 03/06/2019 22/29/7989  Systolic BP 211 - 941 740 814 - 481  Diastolic BP 69 - 84 84 77 - 70  Wt. (Lbs) 195 191.8 191.6 192 194 194 203  BMI 35.67 34.52 35.04 35.12 35.48 34.92 36.54       Morbid obesity (HCC) Improved. Obesity asssociated with hTN and diabete  Patient re-educated about  the importance of commitment to a  minimum of 150 minutes of exercise per week as able.  The importance of healthy food choices with portion control discussed, as well as eating regularly and within a 12 hour window most days. The need to choose "clean , green" food 50 to 75% of the time is discussed, as well as to make water the primary drink and set a goal of 64 ounces water daily.    Weight /BMI 09/17/2019 07/10/2019 07/10/2019  WEIGHT 195 lb 191 lb 12.8 oz 191 lb 9.6 oz  HEIGHT 5\' 2"  5' 2.5" 5\' 2"   BMI 35.67 kg/m2 34.52 kg/m2 35.04 kg/m2      Type 2 diabetes mellitus with vascular disease ([place]) Tiffany Jacobs is reminded of the importance of commitment to daily physical activity for 30 minutes or more, as able and the need to limit carbohydrate intake to 30 to 60 grams per meal to help with blood sugar control.  The need to take medication as prescribed, test blood sugar as directed, and to call between visits if there is a concern that blood sugar is uncontrolled is also discussed.   Tiffany Jacobs is reminded of the importance of daily foot exam, annual eye examination, and good blood sugar, blood pressure and cholesterol control. Managed by Endo, improved, she is applauded on this  Diabetic Labs Latest Ref Rng & Units 07/10/2019 05/21/2019 03/06/2019 10/29/2018 07/06/2018  HbA1c 4.0 - 5.6 % 7.5(A) - 7.2(A) 8.2(H) 11.4(H)    Microalbumin mg/dL - 0.5 - - -  Micro/Creat Ratio <30 mcg/mg creat - 5 - - -  Chol <200 mg/dL - 182 - - 189  HDL > OR = 50 mg/dL - 61 - - 43(L)  Calc LDL mg/dL (calc) - 107(H) - - 126(H)  Triglycerides <150 mg/dL - 48 - - 98  Creatinine 0.60 - 0.93 mg/dL - 0.73 - 0.67 0.86   BP/Weight 09/17/2019 07/10/2019 07/10/2019 05/20/2019 03/06/2019 03/06/2019 04/88/8916  Systolic BP 945 - 038 882 800 - 349  Diastolic BP 69 - 84 84 77 - 70  Wt. (Lbs) 195 191.8 191.6 192 194 194 203  BMI 35.67 34.52 35.04 35.12 35.48 34.92 36.54   Foot/eye exam completion dates Latest Ref Rng & Units 12/04/2018 02/14/2018  Eye Exam No Retinopathy - -  Foot exam Order - - -  Foot Form Completion - Done Done        Educated About Covid-19 Virus Infection Still not sure she will take th vacine , she is re educated about the value of this  Mixed hyperlipidemia Hyperlipidemia:Low fat diet discussed and encouraged.   Lipid Panel  Lab Results  Component Value Date   CHOL 182 05/21/2019   HDL 61 05/21/2019   LDLCALC 107 (H) 05/21/2019   TRIG 48 05/21/2019   CHOLHDL 3.0 05/21/2019   Needs to reduce fat in diet Updated lab needed at/ before next visit.     Insomnia Sleep hygiene reviewed and written information offered also. Prescription sent for  medication needed.   Allergic rhinitis Controlled, no change in medication

## 2019-09-20 NOTE — Assessment & Plan Note (Signed)
Hyperlipidemia:Low fat diet discussed and encouraged.   Lipid Panel  Lab Results  Component Value Date   CHOL 182 05/21/2019   HDL 61 05/21/2019   LDLCALC 107 (H) 05/21/2019   TRIG 48 05/21/2019   CHOLHDL 3.0 05/21/2019   Needs to reduce fat in diet Updated lab needed at/ before next visit.

## 2019-09-20 NOTE — Assessment & Plan Note (Signed)
Still not sure she will take th vacine , she is re educated about the value of this

## 2019-09-20 NOTE — Assessment & Plan Note (Signed)
Improved. Obesity asssociated with hTN and diabete  Patient re-educated about  the importance of commitment to a  minimum of 150 minutes of exercise per week as able.  The importance of healthy food choices with portion control discussed, as well as eating regularly and within a 12 hour window most days. The need to choose "clean , green" food 50 to 75% of the time is discussed, as well as to make water the primary drink and set a goal of 64 ounces water daily.    Weight /BMI 09/17/2019 07/10/2019 07/10/2019  WEIGHT 195 lb 191 lb 12.8 oz 191 lb 9.6 oz  HEIGHT 5\' 2"  5' 2.5" 5\' 2"   BMI 35.67 kg/m2 34.52 kg/m2 35.04 kg/m2

## 2019-09-30 ENCOUNTER — Ambulatory Visit (HOSPITAL_COMMUNITY)
Admission: RE | Admit: 2019-09-30 | Discharge: 2019-09-30 | Disposition: A | Discharge status: Home / Self Care | Payer: Medicare Other | Source: Ambulatory Visit | Attending: Family Medicine | Admitting: Family Medicine

## 2019-09-30 ENCOUNTER — Other Ambulatory Visit: Payer: Self-pay

## 2019-09-30 DIAGNOSIS — Z1231 Encounter for screening mammogram for malignant neoplasm of breast: Secondary | ICD-10-CM | POA: Insufficient documentation

## 2019-10-02 ENCOUNTER — Ambulatory Visit: Payer: Medicare Other | Admitting: Family Medicine

## 2019-10-03 ENCOUNTER — Other Ambulatory Visit: Payer: Self-pay

## 2019-10-03 ENCOUNTER — Encounter: Payer: Self-pay | Admitting: Family Medicine

## 2019-10-03 ENCOUNTER — Ambulatory Visit (INDEPENDENT_AMBULATORY_CARE_PROVIDER_SITE_OTHER): Payer: Medicare Other | Admitting: Family Medicine

## 2019-10-03 VITALS — BP 111/70 | HR 105 | Resp 16 | Wt 191.0 lb

## 2019-10-03 DIAGNOSIS — H6123 Impacted cerumen, bilateral: Secondary | ICD-10-CM | POA: Diagnosis not present

## 2019-10-03 DIAGNOSIS — I10 Essential (primary) hypertension: Secondary | ICD-10-CM | POA: Diagnosis not present

## 2019-10-03 DIAGNOSIS — R350 Frequency of micturition: Secondary | ICD-10-CM

## 2019-10-03 DIAGNOSIS — H6120 Impacted cerumen, unspecified ear: Secondary | ICD-10-CM | POA: Insufficient documentation

## 2019-10-03 DIAGNOSIS — E1159 Type 2 diabetes mellitus with other circulatory complications: Secondary | ICD-10-CM

## 2019-10-03 LAB — POCT URINALYSIS DIP (CLINITEK)
Bilirubin, UA: NEGATIVE
Blood, UA: NEGATIVE
Glucose, UA: >=1000 mg/dL — AB
Ketones, POC UA: NEGATIVE mg/dL
Leukocytes, UA: NEGATIVE
Nitrite, UA: NEGATIVE
POC PROTEIN,UA: NEGATIVE
Spec Grav, UA: 1.01 (ref 1.010–1.025)
Urobilinogen, UA: 0.2 E.U./dL
pH, UA: 6.5 (ref 5.0–8.0)

## 2019-10-03 LAB — POCT GLYCOSYLATED HEMOGLOBIN (HGB A1C)
HbA1c POC (<> result, manual entry): 11.6 % (ref 4.0–5.6)
HbA1c, POC (controlled diabetic range): 11.6 % — AB (ref 0.0–7.0)
HbA1c, POC (prediabetic range): 11.6 % — AB (ref 5.7–6.4)
Hemoglobin A1C: 11.6 % — AB (ref 4.0–5.6)

## 2019-10-03 MED ORDER — LANTUS SOLOSTAR 100 UNIT/ML ~~LOC~~ SOPN
50.0000 [IU] | PEN_INJECTOR | Freq: Every day | SUBCUTANEOUS | 99 refills | Status: DC
Start: 2019-10-03 — End: 2021-01-20

## 2019-10-03 MED ORDER — PANTOPRAZOLE SODIUM 20 MG PO TBEC
20.0000 mg | DELAYED_RELEASE_TABLET | Freq: Two times a day (BID) | ORAL | 0 refills | Status: DC
Start: 2019-10-03 — End: 2019-12-18

## 2019-10-03 NOTE — Patient Instructions (Addendum)
F/u in October as before, call if you need me sooner  Dr Benjamine Mola to eval and mamange bilateral cerumen impaction, end August / September appt per pt preference. ( If able please obtain appt info for pt and let her know, states she does not answer her tele for unknown numbers)  Blood sugar is uncontrolled ,so dry mouth and excess urination  Glycohb in office today, I will send info to Dr Dorris Fetch, whio I have asked to and who is managing your blood sugar, that is why you go to his office!  Keep appointments with Jearld Fenton as scheduled please, vital to getting your blood sugar right  Increase insulin to 45 units daily test every day, and stop eating late at night please  Goal for fasting blood sugar ranges from 80 to 120 and 2 hours after any meal or at bedtime should be between 130 to 170.  For the tickle and cough, increase protonix to two times daily for the next 1 month, and see if this helps, also reduce caffeine intake  It is important that you exercise regularly at least 30 minutes 5 times a week. If you develop chest pain, have severe difficulty breathing, or feel very tired, stop exercising immediately and seek medical attention   Think about what you will eat, plan ahead. Choose " clean, green, fresh or frozen" over canned, processed or packaged foods which are more sugary, salty and fatty. 70 to 75% of food eaten should be vegetables and fruit. Three meals at set times with snacks allowed between meals, but they must be fruit or vegetables. Aim to eat over a 12 hour period , example 7 am to 7 pm, and STOP after  your last meal of the day. Drink water,generally about 64 ounces per day, no other drink is as healthy. Fruit juice is best enjoyed in a healthy way, by EATING the fruit.  Thanks for choosing [facility], we consider it a privelige to serve you.

## 2019-10-03 NOTE — Progress Notes (Signed)
[name]     MRN: [id number]      DOB: May 26, 1945   HPI Tiffany Jacobs is here for follow up and re-evaluation of chronic medical conditions, medication management and review of any available recent lab and radiology data.  Preventive health is updated, specifically  Cancer screening and Immunization.   Questions or concerns regarding consultations or procedures which the PT has had in the interim are  addressed. The PT denies any adverse reactions to current medications since the last visit.  1 week h/o urinary frequency , no dysuria, no flank pain , nausea, fever or chills Ear pressure and reduced hearing wants this checked   ROS Denies recent fever or chills. Denies sinus pressure, nasal congestion, ear pain or sore throat. Denies chest congestion, productive cough or wheezing. Denies chest pains, palpitations and leg swelling Denies abdominal pain, nausea, vomiting,diarrhea or constipation.   Denies dysuria, frequency, hesitancy or incontinence. Denies uncontrolled  joint pain, swelling and limitation in mobility. Denies headaches, seizures, numbness, or tingling. Denies depression,  Uncontrolled anxiety or insomnia. Denies skin break down or rash.   PE  BP 111/70   Pulse 105   Resp 16   Wt 191 lb (86.6 kg)   SpO2 98%   BMI 34.93 kg/m   Patient alert and oriented and in no cardiopulmonary distress.  HEENT: No facial asymmetry, EOMI,     Neck supple .  Chest: Clear to auscultation bilaterally.  CVS: S1, S2 no murmurs, no S3.Regular rate.  ABD: Soft non tender.   Ext: No edema  MS: Adequate ROM spine, shoulders, hips and knees.  Skin: Intact, no ulcerations or rash noted.  Psych: Good eye contact, normal affect. Memory intact not anxious or depressed appearing.  CNS: CN 2-12 intact, power,  normal throughout.no focal deficits noted.   Assessment & Plan  Cerumen impaction Refer ent, symptomatic and deep dried cerumen bilaterally, impaction  present  Urinary frequency C/o urinary frequency and dry mouth, only abn is excess glucose in urine. On site test for diabetes  Shows markedly uncontrolled . Pt has been referred to Endo , needs to folow with Endo, will message dr also   Type 2 diabetes mellitus with vascular disease [facility]) Tiffany Jacobs is reminded of the importance of commitment to daily physical activity for 30 minutes or more, as able and the need to limit carbohydrate intake to 30 to 60 grams per meal to help with blood sugar control.   The need to take medication as prescribed, test blood sugar as directed, and to call between visits if there is a concern that blood sugar is uncontrolled is also discussed.   Tiffany Jacobs is reminded of the importance of daily foot exam, annual eye examination, and good blood sugar, blood pressure and cholesterol control. Symptomatic and has again markedly worsened, Endo follow up sooner  Diabetic Labs Latest Ref Rng & Units 10/03/2019 10/03/2019 10/03/2019 10/03/2019 07/10/2019  HbA1c 4.0 - 5.6 % 11.6(A) 11.6(A) 11.6 11.6(A) 7.5(A)  Microalbumin mg/dL - - - - -  Micro/Creat Ratio <30 mcg/mg creat - - - - -  Chol <200 mg/dL - - - - -  HDL > OR = 50 mg/dL - - - - -  Calc LDL mg/dL (calc) - - - - -  Triglycerides <150 mg/dL - - - - -  Creatinine 0.60 - 0.93 mg/dL - - - - -   BP/Weight 10/03/2019 09/17/2019 07/10/2019 07/10/2019 05/20/2019 03/06/2019 47/11/6281  Systolic BP 662 947 -  136 435 391 -  Diastolic BP 70 69 - 84 84 77 -  Wt. (Lbs) 191 195 191.8 191.6 192 194 194  BMI 34.93 35.67 34.52 35.04 35.12 35.48 34.92   Foot/eye exam completion dates Latest Ref Rng & Units 12/04/2018 02/14/2018  Eye Exam No Retinopathy - -  Foot exam Order - - -  Foot Form Completion - Done Done        Morbid obesity ([place])  Patient re-educated about  the importance of commitment to a  minimum of 150 minutes of exercise per week as able.  The importance of healthy food choices with portion control  discussed, as well as eating regularly and within a 12 hour window most days. The need to choose "clean , green" food 50 to 75% of the time is discussed, as well as to make water the primary drink and set a goal of 64 ounces water daily.    Weight /BMI 10/03/2019 09/17/2019 07/10/2019  WEIGHT 191 lb 195 lb 191 lb 12.8 oz  HEIGHT - 5\' 2"  5' 2.5"  BMI 34.93 kg/m2 35.67 kg/m2 34.52 kg/m2      Essential hypertension Controlled, no change in medication DASH diet and commitment to daily physical activity for a minimum of 30 minutes discussed and encouraged, as a part of hypertension management. The importance of attaining a healthy weight is also discussed.  BP/Weight 10/03/2019 09/17/2019 07/10/2019 07/10/2019 05/20/2019 03/06/2019 22/58/3462  Systolic BP 194 712 - 527 129 290 -  Diastolic BP 70 69 - 84 84 77 -  Wt. (Lbs) 191 195 191.8 191.6 192 194 194  BMI 34.93 35.67 34.52 35.04 35.12 35.48 34.92

## 2019-10-03 NOTE — Assessment & Plan Note (Addendum)
Refer ent, symptomatic and deep dried cerumen bilaterally, impaction present

## 2019-10-04 ENCOUNTER — Telehealth: Payer: Self-pay | Admitting: Family Medicine

## 2019-10-04 NOTE — Telephone Encounter (Signed)
The meds went to her mail order- I tried to call the pt and no answer

## 2019-10-04 NOTE — Telephone Encounter (Signed)
Pt wanting know which pharmacy meds were sent to

## 2019-10-05 ENCOUNTER — Encounter: Payer: Self-pay | Admitting: Family Medicine

## 2019-10-05 DIAGNOSIS — R350 Frequency of micturition: Secondary | ICD-10-CM | POA: Insufficient documentation

## 2019-10-05 NOTE — Assessment & Plan Note (Signed)
  Patient re-educated about  the importance of commitment to a  minimum of 150 minutes of exercise per week as able.  The importance of healthy food choices with portion control discussed, as well as eating regularly and within a 12 hour window most days. The need to choose "clean , green" food 50 to 75% of the time is discussed, as well as to make water the primary drink and set a goal of 64 ounces water daily.    Weight /BMI 10/03/2019 09/17/2019 07/10/2019  WEIGHT 191 lb 195 lb 191 lb 12.8 oz  HEIGHT - 5\' 2"  5' 2.5"  BMI 34.93 kg/m2 35.67 kg/m2 34.52 kg/m2

## 2019-10-05 NOTE — Assessment & Plan Note (Signed)
Tiffany Jacobs is reminded of the importance of commitment to daily physical activity for 30 minutes or more, as able and the need to limit carbohydrate intake to 30 to 60 grams per meal to help with blood sugar control.   The need to take medication as prescribed, test blood sugar as directed, and to call between visits if there is a concern that blood sugar is uncontrolled is also discussed.   Tiffany Jacobs is reminded of the importance of daily foot exam, annual eye examination, and good blood sugar, blood pressure and cholesterol control. Symptomatic and has again markedly worsened, Endo follow up sooner  Diabetic Labs Latest Ref Rng & Units 10/03/2019 10/03/2019 10/03/2019 10/03/2019 07/10/2019  HbA1c 4.0 - 5.6 % 11.6(A) 11.6(A) 11.6 11.6(A) 7.5(A)  Microalbumin mg/dL - - - - -  Micro/Creat Ratio <30 mcg/mg creat - - - - -  Chol <200 mg/dL - - - - -  HDL > OR = 50 mg/dL - - - - -  Calc LDL mg/dL (calc) - - - - -  Triglycerides <150 mg/dL - - - - -  Creatinine 0.60 - 0.93 mg/dL - - - - -   BP/Weight 10/03/2019 09/17/2019 07/10/2019 07/10/2019 05/20/2019 03/06/2019 17/71/1657  Systolic BP 903 833 - 383 291 916 -  Diastolic BP 70 69 - 84 84 77 -  Wt. (Lbs) 191 195 191.8 191.6 192 194 194  BMI 34.93 35.67 34.52 35.04 35.12 35.48 34.92   Foot/eye exam completion dates Latest Ref Rng & Units 12/04/2018 02/14/2018  Eye Exam No Retinopathy - -  Foot exam Order - - -  Foot Form Completion - Done Done

## 2019-10-05 NOTE — Assessment & Plan Note (Signed)
Controlled, no change in medication DASH diet and commitment to daily physical activity for a minimum of 30 minutes discussed and encouraged, as a part of hypertension management. The importance of attaining a healthy weight is also discussed.  BP/Weight 10/03/2019 09/17/2019 07/10/2019 07/10/2019 05/20/2019 03/06/2019 61/48/3073  Systolic BP 543 014 - 840 397 953 -  Diastolic BP 70 69 - 84 84 77 -  Wt. (Lbs) 191 195 191.8 191.6 192 194 194  BMI 34.93 35.67 34.52 35.04 35.12 35.48 34.92

## 2019-10-05 NOTE — Assessment & Plan Note (Signed)
C/o urinary frequency and dry mouth, only abn is excess glucose in urine. On site test for diabetes  Shows markedly uncontrolled . Pt has been referred to Endo , needs to folow with Endo, will message dr also

## 2019-10-18 ENCOUNTER — Other Ambulatory Visit: Payer: Self-pay | Admitting: Orthopedic Surgery

## 2019-10-18 DIAGNOSIS — M25512 Pain in left shoulder: Secondary | ICD-10-CM

## 2019-10-18 DIAGNOSIS — G8929 Other chronic pain: Secondary | ICD-10-CM

## 2019-11-06 ENCOUNTER — Other Ambulatory Visit: Payer: Self-pay

## 2019-11-06 DIAGNOSIS — M5010 Cervical disc disorder with radiculopathy, unspecified cervical region: Secondary | ICD-10-CM

## 2019-11-06 MED ORDER — GABAPENTIN 300 MG PO CAPS: 300.0000 mg | ORAL_CAPSULE | Freq: Every day | ORAL | 1 refills | Status: DC

## 2019-11-06 MED ORDER — AMLODIPINE BESYLATE 10 MG PO TABS
10.0000 mg | ORAL_TABLET | Freq: Every day | ORAL | 1 refills | Status: DC
Start: 2019-11-06 — End: 2020-03-11

## 2019-11-07 ENCOUNTER — Encounter: Payer: Self-pay | Admitting: Internal Medicine

## 2019-11-07 ENCOUNTER — Ambulatory Visit: Payer: Medicare HMO | Admitting: Nurse Practitioner

## 2019-11-07 NOTE — Progress Notes (Deleted)
Referring Provider: Fayrene Helper, MD Primary Care Physician:  Fayrene Helper, MD Primary GI:  Dr. Abbey Chatters  No chief complaint on file.   HPI:   Tiffany Jacobs is a 74 y.o. female who presents to schedule a 3-year repeat colonoscopy. The patient's previous colonoscopy was set up by nurse screening.  Colonoscopy was completed 10/10/2016 which found three 5 to 7 mm polyps in the descending colon and splenic flexure, a single 3 mm polyp in the mid descending colon,three 5 to 6 mm polyps in the rectum, internal and external hemorrhoids.  Surgical pathology found the polyps to be a mix of hyperplastic and tubular adenoma.  Recommended repeat colonoscopy in 3 years for surveillance.  Today she states she is doing okay overall.   Past Medical History:  Diagnosis Date  . Arthritis   . Asthma 1963  . Chest pain with high risk for cardiac etiology 08/21/2017  . Diabetes mellitus, type 2 ([place]) 2004  . GERD (gastroesophageal reflux disease) 2004  . Hypertension 2004  . Low back pain   . Obesity   . RAD (reactive airway disease)   . Sciatica   . Seasonal allergies     Past Surgical History:  Procedure Laterality Date  . ACROMIO-CLAVICULAR JOINT REPAIR Right 11/09/2012   Procedure: ACROMIO-CLAVICULAR JOINT REPAIR;  Surgeon: Carole Civil, MD;  Location: [facility];  Service: Orthopedics;  Laterality: Right;  . COLONOSCOPY N/A 10/10/2016   Procedure: COLONOSCOPY;  Surgeon: Danie Binder, MD;  Location: [facility];  Service: Endoscopy;  Laterality: N/A;  10:30  . EYE SURGERY Right 05/09/2018   cataract  . left breast biopsy for benign disease    . SHOULDER ACROMIOPLASTY Right 11/09/2012   Procedure: RIGHT SHOULDER ACROMIOPLASTY;  Surgeon: Carole Civil, MD;  Location: [facility];  Service: Orthopedics;  Laterality: Right;  . SHOULDER OPEN ROTATOR CUFF REPAIR Right 11/09/2012   Procedure: ROTATOR CUFF REPAIR Right SHOULDER OPEN;  Surgeon: Carole Civil, MD;  Location: [facility];  Service: Orthopedics;  Laterality: Right;  . total knee arthroplasty left  02-01-05   Dr. Aline Brochure  . TUBAL LIGATION  1970    Current Outpatient Medications  Medication Sig Dispense Refill  . albuterol (PROVENTIL) (2.5 MG/3ML) 0.083% nebulizer solution Take 2.5 mg by nebulization every 6 (six) hours as needed for wheezing or shortness of breath.    Marland Kitchen albuterol (VENTOLIN HFA) 108 (90 Base) MCG/ACT inhaler Inhale 2 puffs into the lungs every 6 (six) hours as needed for wheezing or shortness of breath. 18 g 3  . amLODipine (NORVASC) 10 MG tablet Take 1 tablet (10 mg total) by mouth daily. 90 tablet 1  . atorvastatin (LIPITOR) 20 MG tablet Take 1 tablet (20 mg total) by mouth daily. Discontinue pravastatin effective 07/26/2018 90 tablet 3  . blood glucose meter kit and supplies Two times daily testing (dispense meter based on insurance preference) dx e11.65 1 each 0  . fluticasone (FLONASE) 50 MCG/ACT nasal spray Place 2 sprays into both nostrils daily. 48 g 1  . gabapentin (NEURONTIN) 300 MG capsule Take 1 capsule (300 mg total) by mouth at bedtime. 90 capsule 1  . glipiZIDE (GLUCOTROL XL) 10 MG 24 hr tablet TAKE 2 TABLETS EVERY DAY WITH BREAKFAST 180 tablet 1  . glucose blood test strip Use as instructed once daily dx E11.9 50 each 5  . hydrALAZINE (APRESOLINE) 25 MG tablet Take 1 tablet (25 mg total) by mouth 3 (three) times daily.  270 tablet 1  . insulin glargine (LANTUS SOLOSTAR) 100 UNIT/ML Solostar Pen Inject 40 Units into the skin at bedtime. 10 pen 2  . insulin glargine (LANTUS SOLOSTAR) 100 UNIT/ML Solostar Pen Inject 50 Units into the skin daily. 15 pen PRN  . Insulin Pen Needle 31G X 5 MM MISC To be used with Lantus insulin pen. Dispensed based on patient's insurance coverage. Please make sure these fit on the patients lantus pen 100 each 2  . metFORMIN (GLUCOPHAGE) 1000 MG tablet TAKE 1 TABLET TWICE DAILY WITH A MEAL 180 tablet 1  . metoprolol tartrate (LOPRESSOR) 25 MG tablet  Take 25 mg by mouth 2 (two) times daily.    . montelukast (SINGULAIR) 10 MG tablet Take 1 tablet (10 mg total) by mouth at bedtime. 90 tablet 1  . naproxen (NAPROSYN) 500 MG tablet TAKE ONE TABLET ORALLY TWICE DAILY WITH MEALS. 60 tablet 0  . nitroGLYCERIN (NITROSTAT) 0.4 MG SL tablet DISSOLVE ONE TABLET UNDER THE TONGUE EVERY 5 MINUTES AS NEEDED FOR CHEST PAIN. DO NOT EXCEED A TOTAL OF 3 DOSES IN 15 MINUTES  0  . pantoprazole (PROTONIX) 20 MG tablet Take 1 tablet (20 mg total) by mouth daily. 90 tablet 1  . pantoprazole (PROTONIX) 20 MG tablet Take 1 tablet (20 mg total) by mouth 2 (two) times daily before a meal. 60 tablet 0  . spironolactone (ALDACTONE) 100 MG tablet Take 1 tablet (100 mg total) by mouth daily. 90 tablet 1  . traZODone (DESYREL) 100 MG tablet Take 1 tablet (100 mg total) by mouth at bedtime. 90 tablet 1  . TRUEplus Lancets 33G MISC TEST TWO TIMES DAILY 200 each 0   No current facility-administered medications for this visit.    Allergies as of 11/07/2019 - Review Complete 10/05/2019  Allergen Reaction Noted  . Ace inhibitors Cough 11/04/2011  . Latex Itching and Rash 05/04/2014    Family History  Problem Relation Age of Onset  . Kidney failure Father   . Kidney disease Father   . Stroke Brother 89       used drugs , stroke and heart disease  . Diabetes Brother   . Hypertension Brother   . Cancer Daughter 64       breast   . Diabetes Mother   . Asthma Other   . Lung disease Other   . Cancer Other   . Heart disease Other   . Arthritis Other   . Colon cancer Neg Hx   . Colon polyps Neg Hx     Social History   Socioeconomic History  . Marital status: Married    Spouse name: Jeneen Rinks   . Number of children: 7  . Years of education: 8  . Highest education level: GED or equivalent  Occupational History  . Occupation: disabled   Tobacco Use  . Smoking status: Former Smoker    Packs/day: 0.50    Years: 37.00    Pack years: 18.50    Types: Cigarettes     Quit date: 10/12/1988    Years since quitting: 31.0  . Smokeless tobacco: Never Used  Vaping Use  . Vaping Use: Never used  Substance and Sexual Activity  . Alcohol use: No  . Drug use: No  . Sexual activity: Not Currently    Birth control/protection: Post-menopausal  Other Topics Concern  . Not on file  Social History Narrative  . Not on file   Social Determinants of Health   Financial Resource Strain:   .  Difficulty of Paying Living Expenses: Not on file  Food Insecurity:   . Worried About Charity fundraiser in the Last Year: Not on file  . Ran Out of Food in the Last Year: Not on file  Transportation Needs:   . Lack of Transportation (Medical): Not on file  . Lack of Transportation (Non-Medical): Not on file  Physical Activity:   . Days of Exercise per Week: Not on file  . Minutes of Exercise per Session: Not on file  Stress:   . Feeling of Stress : Not on file  Social Connections:   . Frequency of Communication with Friends and Family: Not on file  . Frequency of Social Gatherings with Friends and Family: Not on file  . Attends Religious Services: Not on file  . Active Member of Clubs or Organizations: Not on file  . Attends Archivist Meetings: Not on file  . Marital Status: Not on file    Subjective: Review of Systems  Constitutional: Negative for chills, fever, malaise/fatigue and weight loss.  HENT: Negative for congestion and sore throat.   Respiratory: Negative for cough and shortness of breath.   Cardiovascular: Negative for chest pain and palpitations.  Gastrointestinal: Negative for abdominal pain, blood in stool, diarrhea, melena, nausea and vomiting.  Musculoskeletal: Negative for joint pain and myalgias.  Skin: Negative for rash.  Neurological: Negative for dizziness and weakness.  Endo/Heme/Allergies: Does not bruise/bleed easily.  Psychiatric/Behavioral: Negative for depression. The patient is not nervous/anxious.   All other systems  reviewed and are negative.    Objective: There were no vitals taken for this visit. Physical Exam Vitals and nursing note reviewed.  Constitutional:      General: She is not in acute distress.    Appearance: Normal appearance. She is well-developed. She is not ill-appearing, toxic-appearing or diaphoretic.  HENT:     Head: Normocephalic and atraumatic.     Nose: No congestion or rhinorrhea.  Eyes:     General: No scleral icterus. Cardiovascular:     Rate and Rhythm: Normal rate and regular rhythm.     Heart sounds: Normal heart sounds.  Pulmonary:     Effort: Pulmonary effort is normal. No respiratory distress.     Breath sounds: Normal breath sounds.  Abdominal:     General: Bowel sounds are normal.     Palpations: Abdomen is soft. There is no hepatomegaly, splenomegaly or mass.     Tenderness: There is no abdominal tenderness. There is no guarding or rebound.     Hernia: No hernia is present.  Skin:    General: Skin is warm and dry.     Coloration: Skin is not jaundiced.     Findings: No rash.  Neurological:     General: No focal deficit present.     Mental Status: She is alert and oriented to person, place, and time.  Psychiatric:        Attention and Perception: Attention normal.        Mood and Affect: Mood normal.        Speech: Speech normal.        Behavior: Behavior normal.        Thought Content: Thought content normal.        Cognition and Memory: Cognition and memory normal.      Assessment:  ***   Plan: ***      11/07/2019 1:18 PM   Disclaimer: This note was dictated with voice recognition software. Similar sounding  words can inadvertently be transcribed and may not be corrected upon review.

## 2019-11-13 ENCOUNTER — Ambulatory Visit: Payer: Medicare HMO | Admitting: Nurse Practitioner

## 2019-11-13 ENCOUNTER — Telehealth: Payer: Self-pay | Admitting: Nutrition

## 2019-11-13 ENCOUNTER — Ambulatory Visit: Payer: Medicare Other | Admitting: Nutrition

## 2019-11-13 NOTE — Telephone Encounter (Signed)
vm left on daughters phone to have her mom call and reschedule missed appts.

## 2019-11-13 NOTE — Telephone Encounter (Signed)
No show. Unable to leave vm.

## 2019-12-10 ENCOUNTER — Ambulatory Visit: Payer: Medicare Other | Admitting: Family Medicine

## 2019-12-12 DIAGNOSIS — E559 Vitamin D deficiency, unspecified: Secondary | ICD-10-CM | POA: Diagnosis not present

## 2019-12-12 DIAGNOSIS — I10 Essential (primary) hypertension: Secondary | ICD-10-CM | POA: Diagnosis not present

## 2019-12-12 DIAGNOSIS — E782 Mixed hyperlipidemia: Secondary | ICD-10-CM | POA: Diagnosis not present

## 2019-12-13 LAB — VITAMIN D 25 HYDROXY (VIT D DEFICIENCY, FRACTURES): Vit D, 25-Hydroxy: 28.3 ng/mL — ABNORMAL LOW (ref 30.0–100.0)

## 2019-12-13 LAB — CMP14+EGFR
ALT: 21 IU/L (ref 0–32)
AST: 23 IU/L (ref 0–40)
Albumin/Globulin Ratio: 1.6 (ref 1.2–2.2)
Albumin: 4.5 g/dL (ref 3.7–4.7)
Alkaline Phosphatase: 113 IU/L (ref 44–121)
BUN/Creatinine Ratio: 16 (ref 12–28)
BUN: 12 mg/dL (ref 8–27)
Bilirubin Total: 0.4 mg/dL (ref 0.0–1.2)
CO2: 28 mmol/L (ref 20–29)
Calcium: 9.8 mg/dL (ref 8.7–10.3)
Chloride: 96 mmol/L (ref 96–106)
Creatinine, Ser: 0.77 mg/dL (ref 0.57–1.00)
GFR calc Af Amer: 88 mL/min/{1.73_m2} (ref >59)
GFR calc non Af Amer: 76 mL/min/{1.73_m2} (ref >59)
Globulin, Total: 2.9 g/dL (ref 1.5–4.5)
Glucose: 318 mg/dL — ABNORMAL HIGH (ref 65–99)
Potassium: 4.5 mmol/L (ref 3.5–5.2)
Sodium: 137 mmol/L (ref 134–144)
Total Protein: 7.4 g/dL (ref 6.0–8.5)

## 2019-12-13 LAB — CBC
Hematocrit: 41.2 % (ref 34.0–46.6)
Hemoglobin: 13.3 g/dL (ref 11.1–15.9)
MCH: 27 pg (ref 26.6–33.0)
MCHC: 32.3 g/dL (ref 31.5–35.7)
MCV: 84 fL (ref 79–97)
Platelets: 279 10*3/uL (ref 150–450)
RBC: 4.93 x10E6/uL (ref 3.77–5.28)
RDW: 13.4 % (ref 11.7–15.4)
WBC: 8.3 10*3/uL (ref 3.4–10.8)

## 2019-12-13 LAB — LIPID PANEL
Chol/HDL Ratio: 4 ratio (ref 0.0–4.4)
Cholesterol, Total: 215 mg/dL — ABNORMAL HIGH (ref 100–199)
HDL: 54 mg/dL (ref >39)
LDL Chol Calc (NIH): 135 mg/dL — ABNORMAL HIGH (ref 0–99)
Triglycerides: 145 mg/dL (ref 0–149)
VLDL Cholesterol Cal: 26 mg/dL (ref 5–40)

## 2019-12-18 ENCOUNTER — Other Ambulatory Visit: Payer: Self-pay

## 2019-12-18 ENCOUNTER — Encounter: Payer: Self-pay | Admitting: Family Medicine

## 2019-12-18 ENCOUNTER — Ambulatory Visit (INDEPENDENT_AMBULATORY_CARE_PROVIDER_SITE_OTHER): Payer: Medicare HMO | Admitting: Family Medicine

## 2019-12-18 VITALS — BP 149/84 | HR 98 | Temp 98.5°F | Ht 62.5 in | Wt 198.0 lb

## 2019-12-18 DIAGNOSIS — H6123 Impacted cerumen, bilateral: Secondary | ICD-10-CM

## 2019-12-18 DIAGNOSIS — R053 Chronic cough: Secondary | ICD-10-CM | POA: Diagnosis not present

## 2019-12-18 DIAGNOSIS — E1159 Type 2 diabetes mellitus with other circulatory complications: Secondary | ICD-10-CM | POA: Diagnosis not present

## 2019-12-18 DIAGNOSIS — Z0001 Encounter for general adult medical examination with abnormal findings: Secondary | ICD-10-CM

## 2019-12-18 DIAGNOSIS — E782 Mixed hyperlipidemia: Secondary | ICD-10-CM | POA: Diagnosis not present

## 2019-12-18 DIAGNOSIS — I10 Essential (primary) hypertension: Secondary | ICD-10-CM

## 2019-12-18 DIAGNOSIS — Z0000 Encounter for general adult medical examination without abnormal findings: Secondary | ICD-10-CM

## 2019-12-18 DIAGNOSIS — Z23 Encounter for immunization: Secondary | ICD-10-CM

## 2019-12-18 MED ORDER — SPIRONOLACTONE 25 MG PO TABS: 25.0000 mg | ORAL_TABLET | Freq: Every day | ORAL | 1 refills | Status: DC

## 2019-12-18 MED ORDER — ATORVASTATIN CALCIUM 20 MG PO TABS: 20.0000 mg | ORAL_TABLET | Freq: Every day | ORAL | 3 refills | Status: DC

## 2019-12-18 MED ORDER — AMLODIPINE BESYLATE 10 MG PO TABS: 10.0000 mg | ORAL_TABLET | Freq: Every day | ORAL | 3 refills | Status: DC

## 2019-12-18 NOTE — Assessment & Plan Note (Signed)
Uncontrolled daily cough, non productivex 4 months, worse in the Fall refer pumonary for re eval, h/o childhood asthma

## 2019-12-18 NOTE — Patient Instructions (Addendum)
F/U in office with MD in 4  months, re evaluate blood pressure, call if you need me sooner  Flu vaccine today  New additional medication for blood pressure is spironolactone 25 mg one daily   New for cholesterol is atorvastatin  20 mg one daily.  Please reduce fried and fatty foods  Please follow up with your eNT referral, check with front desk staff  You will be referred to [facility] in [place] re chronic cough  Fasting lipid, cmp and eGFR 5 days before your next visit  It is important that you exercise regularly at least 30 minutes 5 times a week. If you develop chest pain, have severe difficulty breathing, or feel very tired, stop exercising immediately and seek medical attention  Think about what you will eat, plan ahead. Choose " clean, green, fresh or frozen" over canned, processed or packaged foods which are more sugary, salty and fatty. 70 to 75% of food eaten should be vegetables and fruit. Three meals at set times with snacks allowed between meals, but they must be fruit or vegetables. Aim to eat over a 12 hour period , example 7 am to 7 pm, and STOP after  your last meal of the day. Drink water,generally about 64 ounces per day, no other drink is as healthy. Fruit juice is best enjoyed in a healthy way, by EATING the fruit. Thanks for choosing [facility], we consider it a privelige to serve you.

## 2019-12-18 NOTE — Assessment & Plan Note (Signed)

## 2019-12-21 NOTE — Assessment & Plan Note (Signed)
Uncontrolled , add spironolactone DASH diet and commitment to daily physical activity for a minimum of 30 minutes discussed and encouraged, as a part of hypertension management. The importance of attaining a healthy weight is also discussed.  BP/Weight 12/18/2019 10/03/2019 09/17/2019 07/10/2019 07/10/2019 05/20/2019 27/74/1287  Systolic BP 867 672 094 - 709 628 366  Diastolic BP 84 70 69 - 84 84 77  Wt. (Lbs) 198 191 195 191.8 191.6 192 194  BMI 35.64 34.93 35.67 34.52 35.04 35.12 35.48

## 2019-12-21 NOTE — Progress Notes (Signed)
    Tiffany Jacobs     MRN: [id number]      DOB: 10/10/1945  HPI: Patient is in for annual physical exam. Labs are reviewed and medication dose adjusted. Uncontrolled blood pressure and medication is adjusted C/o chronic cough dry, will refer to Pulmonarys are expressed or addressed at the visit. Recent labs, if available are reviewed. Immunization is reviewed , and  updated if needed.   PE: BP (!) 149/84   Pulse 98   Temp 98.5 F (36.9 C)   Ht 5' 2.5" (1.588 m)   Wt 198 lb (89.8 kg)   BMI 35.64 kg/m   Pleasant  female, alert and oriented x 3, in no cardio-pulmonary distress. Afebrile. HEENT No facial trauma or asymetry. Sinuses non tender.  Extra occullar muscles intact.. External ears normal, .bilateral cerumen impaction noted Neck: supple, no adenopathy,JVD or thyromegaly.No bruits.  Chest: Clear to ascultation bilaterally.No crackles or wheezes. Non tender to palpation  Breast: Not examined. Asymptomatic, normal mammogram 09/2019   Cardiovascular system; Heart sounds normal,  S1 and  S2 ,no S3.  No murmur, or thrill. Peripheral pulses normal.  Abdomen: Soft, non tender, No guarding, tenderness or rebound.   GU: Asymptomatic , not examined  UX:[id number]  ROM of spine, hips , shoulders and knees. deformity ,swelling or crepitus noted. No muscle wasting or atrophy.   Neurologic: Cranial nerves 2 to 12 intact. Power, tone ,sensation and reflexes normal throughout.  disturbance in gait. No tremor.  Skin: Intact, no ulceration, erythema , scaling or rash noted. Pigmentation normal throughout  Psych; Normal mood and affect. Judgement and concentration normal   Assessment & Plan:  Annual physical exam Annual exam as documented. Counseling done  re healthy lifestyle involving commitment to 150 minutes exercise per week, heart healthy diet, and attaining healthy weight.The importance of adequate sleep also discussed. Regular seat belt use and home  safety, is also discussed. Changes in health habits are decided on by the patient with goals and time frames  set for achieving them. Immunization and cancer screening needs are specifically addressed at this visit.   Chronic cough Uncontrolled daily cough, non productivex 4 months, worse in the Fall refer pumonary for re eval, h/o childhood asthma  Essential hypertension Uncontrolled , add spironolactone DASH diet and commitment to daily physical activity for a minimum of 30 minutes discussed and encouraged, as a part of hypertension management. The importance of attaining a healthy weight is also discussed.  BP/Weight 12/18/2019 10/03/2019 09/17/2019 07/10/2019 07/10/2019 05/20/2019 25/36/6440  Systolic BP 347 425 956 - 387 564 332  Diastolic BP 84 70 69 - 84 84 77  Wt. (Lbs) 198 191 195 191.8 191.6 192 194  BMI 35.64 34.93 35.67 34.52 35.04 35.12 35.48       Cerumen impaction Needs to follow through with ENT referral  Mixed hyperlipidemia Hyperlipidemia:Low fat diet discussed and encouraged.   Lipid Panel  Lab Results  Component Value Date   CHOL 215 (H) 12/12/2019   HDL 54 12/12/2019   LDLCALC 135 (H) 12/12/2019   TRIG 145 12/12/2019   CHOLHDL 4.0 12/12/2019     Uncontrolled and not at goal, dose increase in lipitor and dietary modification

## 2019-12-21 NOTE — Assessment & Plan Note (Signed)
Hyperlipidemia:Low fat diet discussed and encouraged.   Lipid Panel  Lab Results  Component Value Date   CHOL 215 (H) 12/12/2019   HDL 54 12/12/2019   LDLCALC 135 (H) 12/12/2019   TRIG 145 12/12/2019   CHOLHDL 4.0 12/12/2019     Uncontrolled and not at goal, dose increase in lipitor and dietary modification

## 2019-12-21 NOTE — Assessment & Plan Note (Signed)
Needs to follow through with ENT referral

## 2019-12-26 ENCOUNTER — Other Ambulatory Visit: Payer: Self-pay

## 2019-12-26 VITALS — BP 149/84 | HR 98 | Temp 98.5°F | Ht 62.5 in | Wt 198.0 lb

## 2019-12-26 DIAGNOSIS — Z0000 Encounter for general adult medical examination without abnormal findings: Secondary | ICD-10-CM

## 2019-12-26 NOTE — Progress Notes (Signed)
ENTERED IN ERROR     Lonn Georgia, LPN   [id number]   Nurse Notes:         This encounter was created in error - please disregard.

## 2020-01-06 ENCOUNTER — Ambulatory Visit (INDEPENDENT_AMBULATORY_CARE_PROVIDER_SITE_OTHER): Payer: Medicare HMO

## 2020-01-06 ENCOUNTER — Other Ambulatory Visit: Payer: Self-pay

## 2020-01-06 DIAGNOSIS — Z0000 Encounter for general adult medical examination without abnormal findings: Secondary | ICD-10-CM

## 2020-01-06 NOTE — Patient Instructions (Addendum)
Tiffany Jacobs , Thank you for taking time to come for your Medicare Wellness Visit. I appreciate your ongoing commitment to your health goals. Please review the following plan we discussed and let me know if I can assist you in the future.   Screening recommendations/referrals: Colonoscopy: Complete; due 74/6/28   Mammogram: Complete; due annually  Bone Density: Complete   Recommended yearly ophthalmology/optometry visit for glaucoma screening and checkup Recommended yearly dental visit for hygiene and checkup  Vaccinations: Influenza vaccine: Complete  Pneumococcal vaccine: Complete   Tdap vaccine: Complete; due 12/24/20  Shingles vaccine: Education provided     Advanced directives: N/A   Conditions/risks identified: None   Next appointment: 74/16/22 @ 3:00 pm with Dr. Moshe Cipro    Preventive Care 74 Years and Older, Female Preventive care refers to lifestyle choices and visits with your health care provider that can promote health and wellness. What does preventive care include?  A yearly physical exam. This is also called an annual well check.  Dental exams once or twice a year.  Routine eye exams. Ask your health care provider how often you should have your eyes checked.  Personal lifestyle choices, including:  Daily care of your teeth and gums.  Regular physical activity.  Eating a healthy diet.  Avoiding tobacco and drug use.  Limiting alcohol use.  Practicing safe sex.  Taking low-dose aspirin every day.  Taking vitamin and mineral supplements as recommended by your health care provider. What happens during an annual well check? The services and screenings done by your health care provider during your annual well check will depend on your age, overall health, lifestyle risk factors, and family history of disease. Counseling  Your health care provider may ask you questions about your:  Alcohol use.  Tobacco use.  Drug use.  Emotional well-being.  Home  and relationship well-being.  Sexual activity.  Eating habits.  History of falls.  Memory and ability to understand (cognition).  Work and work Statistician.  Reproductive health. Screening  You may have the following tests or measurements:  Height, weight, and BMI.  Blood pressure.  Lipid and cholesterol levels. These may be checked every 5 years, or more frequently if you are over 66 years old.  Skin check.  Lung cancer screening. You may have this screening every year starting at age 39 if you have a 30-pack-year history of smoking and currently smoke or have quit within the past 15 years.  Fecal occult blood test (FOBT) of the stool. You may have this test every year starting at age 74.  Flexible sigmoidoscopy or colonoscopy. You may have a sigmoidoscopy every 5 years or a colonoscopy every 10 years starting at age 30.  Hepatitis C blood test.  Hepatitis B blood test.  Sexually transmitted disease (STD) testing.  Diabetes screening. This is done by checking your blood sugar (glucose) after you have not eaten for a while (fasting). You may have this done every 1-3 years.  Bone density scan. This is done to screen for osteoporosis. You may have this done starting at age 85.  Mammogram. This may be done every 1-2 years. Talk to your health care provider about how often you should have regular mammograms. Talk with your health care provider about your test results, treatment options, and if necessary, the need for more tests. Vaccines  Your health care provider may recommend certain vaccines, such as:  Influenza vaccine. This is recommended every year.  Tetanus, diphtheria, and acellular pertussis (Tdap, Td) vaccine.  You may need a Td booster every 10 years.  Zoster vaccine. You may need this after age 13.  Pneumococcal 13-valent conjugate (PCV13) vaccine. One dose is recommended after age 69.  Pneumococcal polysaccharide (PPSV23) vaccine. One dose is recommended  after age 52. Talk to your health care provider about which screenings and vaccines you need and how often you need them. This information is not intended to replace advice given to you by your health care provider. Make sure you discuss any questions you have with your health care provider. Document Released: 03/20/2015 Document Revised: 11/11/2015 Document Reviewed: 12/23/2014 Elsevier Interactive Patient Education  2017 [place] Prevention in the Home Falls can cause injuries. They can happen to people of all ages. There are many things you can do to make your home safe and to help prevent falls. What can I do on the outside of my home?  Regularly fix the edges of walkways and driveways and fix any cracks.  Remove anything that might make you trip as you walk through a door, such as a raised step or threshold.  Trim any bushes or trees on the path to your home.  Use bright outdoor lighting.  Clear any walking paths of anything that might make someone trip, such as rocks or tools.  Regularly check to see if handrails are loose or broken. Make sure that both sides of any steps have handrails.  Any raised decks and porches should have guardrails on the edges.  Have any leaves, snow, or ice cleared regularly.  Use sand or salt on walking paths during winter.  Clean up any spills in your garage right away. This includes oil or grease spills. What can I do in the bathroom?  Use night lights.  Install grab bars by the toilet and in the tub and shower. Do not use towel bars as grab bars.  Use non-skid mats or decals in the tub or shower.  If you need to sit down in the shower, use a plastic, non-slip stool.  Keep the floor dry. Clean up any water that spills on the floor as soon as it happens.  Remove soap buildup in the tub or shower regularly.  Attach bath mats securely with double-sided non-slip rug tape.  Do not have throw rugs and other things on the floor  that can make you trip. What can I do in the bedroom?  Use night lights.  Make sure that you have a light by your bed that is easy to reach.  Do not use any sheets or blankets that are too big for your bed. They should not hang down onto the floor.  Have a firm chair that has side arms. You can use this for support while you get dressed.  Do not have throw rugs and other things on the floor that can make you trip. What can I do in the kitchen?  Clean up any spills right away.  Avoid walking on wet floors.  Keep items that you use a lot in easy-to-reach places.  If you need to reach something above you, use a strong step stool that has a grab bar.  Keep electrical cords out of the way.  Do not use floor polish or wax that makes floors slippery. If you must use wax, use non-skid floor wax.  Do not have throw rugs and other things on the floor that can make you trip. What can I do with my stairs?  Do not leave any  items on the stairs.  Make sure that there are handrails on both sides of the stairs and use them. Fix handrails that are broken or loose. Make sure that handrails are as long as the stairways.  Check any carpeting to make sure that it is firmly attached to the stairs. Fix any carpet that is loose or worn.  Avoid having throw rugs at the top or bottom of the stairs. If you do have throw rugs, attach them to the floor with carpet tape.  Make sure that you have a light switch at the top of the stairs and the bottom of the stairs. If you do not have them, ask someone to add them for you. What else can I do to help prevent falls?  Wear shoes that:  Do not have high heels.  Have rubber bottoms.  Are comfortable and fit you well.  Are closed at the toe. Do not wear sandals.  If you use a stepladder:  Make sure that it is fully opened. Do not climb a closed stepladder.  Make sure that both sides of the stepladder are locked into place.  Ask someone to hold it  for you, if possible.  Clearly mark and make sure that you can see:  Any grab bars or handrails.  First and last steps.  Where the edge of each step is.  Use tools that help you move around (mobility aids) if they are needed. These include:  Canes.  Walkers.  Scooters.  Crutches.  Turn on the lights when you go into a dark area. Replace any light bulbs as soon as they burn out.  Set up your furniture so you have a clear path. Avoid moving your furniture around.  If any of your floors are uneven, fix them.  If there are any pets around you, be aware of where they are.  Review your medicines with your doctor. Some medicines can make you feel dizzy. This can increase your chance of falling. Ask your doctor what other things that you can do to help prevent falls. This information is not intended to replace advice given to you by your health care provider. Make sure you discuss any questions you have with your health care provider. Document Released: 12/18/2008 Document Revised: 07/30/2015 Document Reviewed: 03/28/2014 Elsevier Interactive Patient Education  2017 [employer].

## 2020-01-06 NOTE — Progress Notes (Signed)
Subjective:   Tiffany Jacobs is a 74 y.o. female who presents for Medicare Annual (Subsequent) preventive examination.        Objective:    There were no vitals filed for this visit. There is no height or weight on file to calculate BMI.  Advanced Directives 12/20/2017 10/12/2016 10/10/2016 05/19/2015 05/04/2014 11/04/2013 11/09/2012  Does Patient Have a Medical Advance Directive? _0  No Patient would not like information;Patient does not have advance directive  Would patient like information on creating a medical advance directive? Yes (ED - Information included in AVS) No - Patient declined No - Patient declined No - patient declined information Yes - Scientist, clinical (histocompatibility and immunogenetics) given Yes - Scientist, clinical (histocompatibility and immunogenetics) given -  Pre-existing out of facility DNR order (yellow form or pink MOST form) - - - - - - No    Current Medications (verified) Outpatient Encounter Medications as of 01/06/2020  Medication Sig   albuterol (PROVENTIL) (2.5 MG/3ML) 0.083% nebulizer solution Take 2.5 mg by nebulization every 6 (six) hours as needed for wheezing or shortness of breath. (Patient not taking: Reported on 12/18/2019)   albuterol (VENTOLIN HFA) 108 (90 Base) MCG/ACT inhaler Inhale 2 puffs into the lungs every 6 (six) hours as needed for wheezing or shortness of breath.   amLODipine (NORVASC) 10 MG tablet Take 1 tablet (10 mg total) by mouth daily.   amLODipine (NORVASC) 10 MG tablet Take 1 tablet (10 mg total) by mouth daily.   atorvastatin (LIPITOR) 20 MG tablet Take 1 tablet (20 mg total) by mouth daily.   blood glucose meter kit and supplies Two times daily testing (dispense meter based on insurance preference) dx e11.65   fluticasone (FLONASE) 50 MCG/ACT nasal spray Place 2 sprays into both nostrils daily.   gabapentin (NEURONTIN) 300 MG capsule Take 1 capsule (300 mg total) by mouth at bedtime.   glipiZIDE (GLUCOTROL XL) 10 MG 24 hr tablet TAKE 2 TABLETS EVERY DAY WITH BREAKFAST    glucose blood test strip Use as instructed once daily dx E11.9   hydrALAZINE (APRESOLINE) 25 MG tablet Take 1 tablet (25 mg total) by mouth 3 (three) times daily.   insulin glargine (LANTUS SOLOSTAR) 100 UNIT/ML Solostar Pen Inject 40 Units into the skin at bedtime.   insulin glargine (LANTUS SOLOSTAR) 100 UNIT/ML Solostar Pen Inject 50 Units into the skin daily.   Insulin Pen Needle 31G X 5 MM MISC To be used with Lantus insulin pen. Dispensed based on patient's insurance coverage. Please make sure these fit on the patients lantus pen   latanoprost (XALATAN) 0.005 % ophthalmic solution SMARTSIG:1 Drop(s) In Eye(s) Every Evening   metFORMIN (GLUCOPHAGE) 1000 MG tablet TAKE 1 TABLET TWICE DAILY WITH A MEAL   metoprolol tartrate (LOPRESSOR) 25 MG tablet Take 25 mg by mouth 2 (two) times daily.   montelukast (SINGULAIR) 10 MG tablet Take 1 tablet (10 mg total) by mouth at bedtime.   naproxen (NAPROSYN) 500 MG tablet TAKE ONE TABLET ORALLY TWICE DAILY WITH MEALS.   nitroGLYCERIN (NITROSTAT) 0.4 MG SL tablet DISSOLVE ONE TABLET UNDER THE TONGUE EVERY 5 MINUTES AS NEEDED FOR CHEST PAIN. DO NOT EXCEED A TOTAL OF 3 DOSES IN 15 MINUTES (Patient not taking: Reported on 12/18/2019)   pantoprazole (PROTONIX) 20 MG tablet Take 1 tablet (20 mg total) by mouth daily.   spironolactone (ALDACTONE) 25 MG tablet Take 1 tablet (25 mg total) by mouth daily.   traZODone (DESYREL) 100 MG tablet Take 1 tablet (  100 mg total) by mouth at bedtime.   TRUEplus Lancets 33G MISC TEST TWO TIMES DAILY   No facility-administered encounter medications on file as of 01/06/2020.    Allergies (verified) Ace inhibitors and Latex   History: Past Medical History:  Diagnosis Date   Arthritis    Asthma 1963   Chest pain with high risk for cardiac etiology 08/21/2017   Diabetes mellitus, type 2 ([place]) 2004   GERD (gastroesophageal reflux disease) 2004   Hypertension 2004   Low back pain    Obesity    RAD  (reactive airway disease)    Sciatica    Seasonal allergies    Past Surgical History:  Procedure Laterality Date   ACROMIO-CLAVICULAR JOINT REPAIR Right 11/09/2012   Procedure: ACROMIO-CLAVICULAR JOINT REPAIR;  Surgeon: Carole Civil, MD;  Location: [facility];  Service: Orthopedics;  Laterality: Right;   COLONOSCOPY N/A 10/10/2016   Procedure: COLONOSCOPY;  Surgeon: Danie Binder, MD;  Location: [facility];  Service: Endoscopy;  Laterality: N/A;  10:30   EYE SURGERY Right 05/09/2018   cataract   left breast biopsy for benign disease     SHOULDER ACROMIOPLASTY Right 11/09/2012   Procedure: RIGHT SHOULDER ACROMIOPLASTY;  Surgeon: Carole Civil, MD;  Location: [facility];  Service: Orthopedics;  Laterality: Right;   SHOULDER OPEN ROTATOR CUFF REPAIR Right 11/09/2012   Procedure: ROTATOR CUFF REPAIR Right SHOULDER OPEN;  Surgeon: Carole Civil, MD;  Location: [facility];  Service: Orthopedics;  Laterality: Right;   total knee arthroplasty left  02-01-05   Dr. Aline Brochure   TUBAL LIGATION  1970   Family History  Problem Relation Age of Onset   Kidney failure Father    Kidney disease Father    Stroke Brother 34       used drugs , stroke and heart disease   Diabetes Brother    Hypertension Brother    Cancer Daughter 52       breast    Diabetes Mother    Asthma Other    Lung disease Other    Cancer Other    Heart disease Other    Arthritis Other    Colon cancer Neg Hx    Colon polyps Neg Hx    Social History   Socioeconomic History   Marital status: Married    Spouse name: Jeneen Rinks    Number of children: 7   Years of education: 8   Highest education level: GED or equivalent  Occupational History   Occupation: disabled   Tobacco Use   Smoking status: Former Smoker    Packs/day: 0.50    Years: 37.00    Pack years: 18.50    Types: Cigarettes    Quit date: 10/12/1988    Years since quitting: 31.2   Smokeless tobacco: Never Used  Vaping Use    Vaping Use: Never used  Substance and Sexual Activity   Alcohol use: No   Drug use: No   Sexual activity: Not Currently    Birth control/protection: Post-menopausal  Other Topics Concern   Not on file  Social History Narrative   Not on file   Social Determinants of Health   Financial Resource Strain:    Difficulty of Paying Living Expenses: Not on file  Food Insecurity:    Worried About Charity fundraiser in the Last Year: Not on file   [employer] of Food in the Last Year: Not on file  Transportation Needs:    Lack of Transportation (  Medical): Not on file   Lack of Transportation (Non-Medical): Not on file  Physical Activity:    Days of Exercise per Week: Not on file   Minutes of Exercise per Session: Not on file  Stress:    Feeling of Stress : Not on file  Social Connections:    Frequency of Communication with Friends and Family: Not on file   Frequency of Social Gatherings with Friends and Family: Not on file   Attends Religious Services: Not on file   Active Member of Clubs or Organizations: Not on file   Attends Archivist Meetings: Not on file   Marital Status: Not on file    Tobacco Counseling Counseling given: Not Answered                  Diabetic? Yes          Activities of Daily Living No flowsheet data found.  Patient Care Team: Fayrene Helper, MD as PCP - General Herminio Commons, MD (Inactive) as PCP - Cardiology (Cardiology) Carole Civil, MD as Consulting Physician (Orthopedic Surgery) Leta Baptist, MD as Consulting Physician (Otolaryngology) Eloise Harman, DO as Consulting Physician (Internal Medicine)  Indicate any recent Medical Services you may have received from other than [facility] providers in the past year (date may be approximate).     Assessment:   This is a routine wellness examination for [place].  Hearing/Vision screen No exam data present  Dietary issues and exercise activities  discussed:    Goals     DIET - REDUCE SUGAR INTAKE     Have 3 meals a day     Reduce fried foods     Recommend decreasing the amount of foods that are fried. Discussed using an air fryer in place of a deep fryer.      Depression Screen PHQ 2/9 Scores 12/18/2019 09/17/2019 07/10/2019 12/25/2018 10/11/2018 02/14/2018 12/20/2017  PHQ - 2 Score 0 0 6 0 0 2 0  PHQ- 9 Score - - 14 - - 2 -    Fall Risk Fall Risk  12/18/2019 10/03/2019 09/17/2019 07/10/2019 12/25/2018  Falls in the past year? 0 0 0 0 0  Number falls in past yr: - 0 - 0 0  Injury with Fall? - 0 - 0 0  Risk for fall due to : - - - - -  Risk for fall due to: Comment - - - - -    Any stairs in or around the home? Yes  If so, are there any without handrails? Yes  Home free of loose throw rugs in walkways, pet beds, electrical cords, etc? Yes  Adequate lighting in your home to reduce risk of falls? Yes   ASSISTIVE DEVICES UTILIZED TO PREVENT FALLS:  Life alert? No  Use of a cane, walker or w/c? No  Grab bars in the bathroom? No  Shower chair or bench in shower? No  Elevated toilet seat or a handicapped toilet? No   TIMED UP AND GO:  Was the test performed? No .    Cognitive Function:     6CIT Screen 12/25/2018 12/20/2017 10/12/2016  What Year? 0 points 0 points 0 points  What month? 0 points 0 points 0 points  What time? 0 points 0 points 0 points  Count back from 20 0 points 0 points 0 points  Months in reverse 0 points 2 points 0 points  Repeat phrase 0 points 0 points 0 points  Total Score 0  2 0    Immunizations Immunization History  Administered Date(s) Administered   Fluad Quad(high Dose 65+) 12/04/2018, 12/18/2019   Influenza Whole 03/29/2006, 11/27/2009   Influenza,inj,Quad PF,6+ Mos 03/20/2013, 01/24/2014, 12/25/2014, 01/22/2016, 10/31/2016, 01/31/2017, 10/19/2017   Pneumococcal Conjugate-13 06/06/2014   Pneumococcal Polysaccharide-23 08/27/2004, 11/27/2009, 08/27/2015   Td 08/27/2004    TDAP  status: Up to date Flu Vaccine status: Up to date Pneumococcal vaccine status: Up to date Covid-19 vaccine status: Completed vaccines  Qualifies for Shingles Vaccine? Yes   Zostavax completed No   Shingrix Completed?: No.    Education has been provided regarding the importance of this vaccine. Patient has been advised to call insurance company to determine out of pocket expense if they have not yet received this vaccine. Advised may also receive vaccine at local pharmacy or Health Dept. Verbalized acceptance and understanding.  Screening Tests Health Maintenance  Topic Date Due   OPHTHALMOLOGY EXAM  02/01/2018   COVID-19 Vaccine (1) 01/06/2020 (Originally 08/13/1957)   TETANUS/TDAP  12/24/2020 (Originally 08/28/2014)   HEMOGLOBIN A1C  04/04/2020   FOOT EXAM  12/17/2020   MAMMOGRAM  09/29/2021   COLONOSCOPY  10/11/2026   INFLUENZA VACCINE  Completed   DEXA SCAN  Completed   Hepatitis C Screening  Completed   PNA vac Low Risk Adult  Completed    Health Maintenance  Health Maintenance Due  Topic Date Due   OPHTHALMOLOGY EXAM  02/01/2018    Colorectal cancer screening: Completed NORMAL . Repeat every 10 years MAMMOGRAM: COMPLETED; FOLLOW UP ANNUALLY  BONE DENSITY: COMPLETE   Lung Cancer Screening: (Low Dose CT Chest recommended if Age 22-80 years, 30 pack-year currently smoking OR have quit w/in 15years.) does not qualify.    Additional Screening:  Hepatitis C Screening: does qualify; Completed.   Vision Screening: Recommended annual ophthalmology exams for early detection of glaucoma and other disorders of the eye. Is the patient up to date with their annual eye exam?  Yes    Who is the provider or what is the name of the office in which the patient attends annual eye exams?  [facility] [place]   If pt is not established with a provider, would they like to be referred to a provider to establish care? No .   Dental Screening: Recommended annual dental exams  for proper oral hygiene  Community Resource Referral / Chronic Care Management: CRR required this visit?  No   CCM required this visit?  No      Plan:     I have personally reviewed and noted the following in the patients chart:    Medical and social history  Use of alcohol, tobacco or illicit drugs   Current medications and supplements  Functional ability and status  Nutritional status  Physical activity  Advanced directives  List of other physicians  Hospitalizations, surgeries, and ER visits in previous 12 months  Vitals  Screenings to include cognitive, depression, and falls  Referrals and appointments  In addition, I have reviewed and discussed with patient certain preventive protocols, quality metrics, and best practice recommendations. A written personalized care plan for preventive services as well as general preventive health recommendations were provided to patient.     Lonn Georgia, LPN   [id number]   Nurse Notes: AWV conducted over the phone with pt consent of televisit via audio. Pt was in the home and provider in the office at the time of visit and phone call took approx 50mn. Pt up to date on health  maintenance and education provided on shingrix vaccine.

## 2020-01-14 ENCOUNTER — Other Ambulatory Visit: Payer: Self-pay

## 2020-01-14 MED ORDER — INSULIN PEN NEEDLE 31G X 5 MM MISC: 5 refills | Status: DC

## 2020-01-16 ENCOUNTER — Ambulatory Visit: Payer: Medicare HMO | Admitting: Nurse Practitioner

## 2020-01-16 ENCOUNTER — Encounter: Payer: Self-pay | Admitting: Internal Medicine

## 2020-01-16 NOTE — Progress Notes (Deleted)
Referring Provider: Fayrene Helper, MD Primary Care Physician:  Fayrene Helper, MD Primary GI:  Dr. Abbey Chatters   No chief complaint on file.   HPI:   Tiffany Jacobs is a 74 y.o. female who presents to schedule 3-year repeat colonoscopy.  The patient has not specifically been seen in our office since 2008.  Previous colonoscopy dated 10/10/2016 which found three sessile polyp in the descending colon splenic flexure from 5 to 7 mm in size, a single 3 mm polyp in the mid descending colon which was sessile, three sessile polyps in the rectum ranging 5 to 6 mm in size.  All polyps were resected and removed.  Multiple small and large mouth diverticula in the rectosigmoid colon, sigmoid colon, descending colon, and ascending colon.  A total of 7 polyps were removed.  Noted external and internal hemorrhoids.  Recommended repeat colonoscopy in 3 years.  Surgical pathology found the polyps to be hyperplastic and rectal polyps and tubular adenoma otherwise.  Today she states she is doing okay overall.  Past Medical History:  Diagnosis Date  . Arthritis   . Asthma 1963  . Chest pain with high risk for cardiac etiology 08/21/2017  . Diabetes mellitus, type 2 ([place]) 2004  . GERD (gastroesophageal reflux disease) 2004  . Hypertension 2004  . Low back pain   . Obesity   . RAD (reactive airway disease)   . Sciatica   . Seasonal allergies     Past Surgical History:  Procedure Laterality Date  . ACROMIO-CLAVICULAR JOINT REPAIR Right 11/09/2012   Procedure: ACROMIO-CLAVICULAR JOINT REPAIR;  Surgeon: Carole Civil, MD;  Location: [facility];  Service: Orthopedics;  Laterality: Right;  . COLONOSCOPY N/A 10/10/2016   Procedure: COLONOSCOPY;  Surgeon: Danie Binder, MD;  [facility];  Service: Endoscopy;  Laterality: N/A;  10:30  . EYE SURGERY Right 05/09/2018   cataract  . left breast biopsy for benign disease    . SHOULDER ACROMIOPLASTY Right 11/09/2012   Procedure: RIGHT  SHOULDER ACROMIOPLASTY;  Surgeon: Carole Civil, MD;  Location: [facility];  Service: Orthopedics;  Laterality: Right;  . SHOULDER OPEN ROTATOR CUFF REPAIR Right 11/09/2012   Procedure: ROTATOR CUFF REPAIR Right SHOULDER OPEN;  Surgeon: Carole Civil, MD;  Location: [facility];  Service: Orthopedics;  Laterality: Right;  . total knee arthroplasty left  02-01-05   Dr. Aline Brochure  . TUBAL LIGATION  1970    Current Outpatient Medications  Medication Sig Dispense Refill  . albuterol (PROVENTIL) (2.5 MG/3ML) 0.083% nebulizer solution Take 2.5 mg by nebulization every 6 (six) hours as needed for wheezing or shortness of breath.     Marland Kitchen albuterol (VENTOLIN HFA) 108 (90 Base) MCG/ACT inhaler Inhale 2 puffs into the lungs every 6 (six) hours as needed for wheezing or shortness of breath. 18 g 3  . amLODipine (NORVASC) 10 MG tablet Take 1 tablet (10 mg total) by mouth daily. 90 tablet 1  . amLODipine (NORVASC) 10 MG tablet Take 1 tablet (10 mg total) by mouth daily. 90 tablet 3  . atorvastatin (LIPITOR) 20 MG tablet Take 1 tablet (20 mg total) by mouth daily. 90 tablet 3  . blood glucose meter kit and supplies Two times daily testing (dispense meter based on insurance preference) dx e11.65 1 each 0  . fluticasone (FLONASE) 50 MCG/ACT nasal spray Place 2 sprays into both nostrils daily. 48 g 1  . gabapentin (NEURONTIN) 300 MG capsule Take 1 capsule (300 mg  total) by mouth at bedtime. 90 capsule 1  . glipiZIDE (GLUCOTROL XL) 10 MG 24 hr tablet TAKE 2 TABLETS EVERY DAY WITH BREAKFAST 180 tablet 1  . glucose blood test strip Use as instructed once daily dx E11.9 50 each 5  . hydrALAZINE (APRESOLINE) 25 MG tablet Take 1 tablet (25 mg total) by mouth 3 (three) times daily. 270 tablet 1  . insulin glargine (LANTUS SOLOSTAR) 100 UNIT/ML Solostar Pen Inject 40 Units into the skin at bedtime. 10 pen 2  . insulin glargine (LANTUS SOLOSTAR) 100 UNIT/ML Solostar Pen Inject 50 Units into the skin daily. 15 pen PRN  .  Insulin Pen Needle 31G X 5 MM MISC To be used with Lantus insulin pen. Dispensed based on patient's insurance coverage. Please make sure these fit on the patients lantus pen 100 each 2  . Insulin Pen Needle 31G X 5 MM MISC To be used with Lantus insulin pen. Dispensed based on patient's insurance coverage. Please make sure these fit on the patients lantus pen 100 each 5  . latanoprost (XALATAN) 0.005 % ophthalmic solution SMARTSIG:1 Drop(s) In Eye(s) Every Evening    . metFORMIN (GLUCOPHAGE) 1000 MG tablet TAKE 1 TABLET TWICE DAILY WITH A MEAL 180 tablet 1  . metoprolol tartrate (LOPRESSOR) 25 MG tablet Take 25 mg by mouth 2 (two) times daily.    . montelukast (SINGULAIR) 10 MG tablet Take 1 tablet (10 mg total) by mouth at bedtime. 90 tablet 1  . naproxen (NAPROSYN) 500 MG tablet TAKE ONE TABLET ORALLY TWICE DAILY WITH MEALS. 60 tablet 0  . nitroGLYCERIN (NITROSTAT) 0.4 MG SL tablet DISSOLVE ONE TABLET UNDER THE TONGUE EVERY 5 MINUTES AS NEEDED FOR CHEST PAIN. DO NOT EXCEED A TOTAL OF 3 DOSES IN 15 MINUTES  0  . pantoprazole (PROTONIX) 20 MG tablet Take 1 tablet (20 mg total) by mouth daily. 90 tablet 1  . spironolactone (ALDACTONE) 25 MG tablet Take 1 tablet (25 mg total) by mouth daily. 90 tablet 1  . traZODone (DESYREL) 100 MG tablet Take 1 tablet (100 mg total) by mouth at bedtime. 90 tablet 1  . TRUEplus Lancets 33G MISC TEST TWO TIMES DAILY 200 each 0   No current facility-administered medications for this visit.    Allergies as of 01/16/2020 - Review Complete 01/06/2020  Allergen Reaction Noted  . Ace inhibitors Cough 11/04/2011  . Latex Itching and Rash 05/04/2014    Family History  Problem Relation Age of Onset  . Kidney failure Father   . Kidney disease Father   . Stroke Brother 61       used drugs , stroke and heart disease  . Diabetes Brother   . Hypertension Brother   . Cancer Daughter 35       breast   . Diabetes Mother   . Asthma Other   . Lung disease Other   .  Cancer Other   . Heart disease Other   . Arthritis Other   . Colon cancer Neg Hx   . Colon polyps Neg Hx     Social History   Socioeconomic History  . Marital status: Married    Spouse name: Jeneen Rinks   . Number of children: 7  . Years of education: 8  . Highest education level: GED or equivalent  Occupational History  . Occupation: disabled   Tobacco Use  . Smoking status: Former Smoker    Packs/day: 0.50    Years: 37.00    Pack years: 18.50  Types: Cigarettes    Quit date: 10/12/1988    Years since quitting: 31.2  . Smokeless tobacco: Never Used  Vaping Use  . Vaping Use: Never used  Substance and Sexual Activity  . Alcohol use: No  . Drug use: No  . Sexual activity: Not Currently    Birth control/protection: Post-menopausal  Other Topics Concern  . Not on file  Social History Narrative  . Not on file   Social Determinants of Health   Financial Resource Strain: Medium Risk  . Difficulty of Paying Living Expenses: Somewhat hard  Food Insecurity: No Food Insecurity  . Worried About Charity fundraiser in the Last Year: Never true  . Ran Out of Food in the Last Year: Never true  Transportation Needs: No Transportation Needs  . Lack of Transportation (Medical): No  . Lack of Transportation (Non-Medical): No  Physical Activity: Sufficiently Active  . Days of Exercise per Week: 5 days  . Minutes of Exercise per Session: 60 min  Stress: No Stress Concern Present  . Feeling of Stress : Not at all  Social Connections: Socially Isolated  . Frequency of Communication with Friends and Family: More than three times a week  . Frequency of Social Gatherings with Friends and Family: More than three times a week  . Attends Religious Services: Never  . Active Member of Clubs or Organizations: No  . Attends Archivist Meetings: Never  . Marital Status: Widowed    Subjective:*** Review of Systems  Constitutional: Negative for chills, fever, malaise/fatigue and  weight loss.  HENT: Negative for congestion and sore throat.   Respiratory: Negative for cough and shortness of breath.   Cardiovascular: Negative for chest pain and palpitations.  Gastrointestinal: Negative for abdominal pain, blood in stool, diarrhea, melena, nausea and vomiting.  Musculoskeletal: Negative for joint pain and myalgias.  Skin: Negative for rash.  Neurological: Negative for dizziness and weakness.  Endo/Heme/Allergies: Does not bruise/bleed easily.  Psychiatric/Behavioral: Negative for depression. The patient is not nervous/anxious.   All other systems reviewed and are negative.    Objective: There were no vitals taken for this visit. Physical Exam Vitals and nursing note reviewed.  Constitutional:      General: She is not in acute distress.    Appearance: Normal appearance. She is well-developed. She is not ill-appearing, toxic-appearing or diaphoretic.  HENT:     Head: Normocephalic and atraumatic.     Nose: No congestion or rhinorrhea.  Eyes:     General: No scleral icterus. Cardiovascular:     Rate and Rhythm: Normal rate and regular rhythm.     Heart sounds: Normal heart sounds.  Pulmonary:     Effort: Pulmonary effort is normal. No respiratory distress.     Breath sounds: Normal breath sounds.  Abdominal:     General: Bowel sounds are normal.     Palpations: Abdomen is soft. There is no hepatomegaly, splenomegaly or mass.     Tenderness: There is no abdominal tenderness. There is no guarding or rebound.     Hernia: No hernia is present.  Skin:    General: Skin is warm and dry.     Coloration: Skin is not jaundiced.     Findings: No rash.  Neurological:     General: No focal deficit present.     Mental Status: She is alert and oriented to person, place, and time.  Psychiatric:        Attention and Perception: Attention normal.  Mood and Affect: Mood normal.        Speech: Speech normal.        Behavior: Behavior normal.        Thought  Content: Thought content normal.        Cognition and Memory: Cognition and memory normal.      Assessment:  ***   Plan: ***    Thank you for allowing [place] to participate in the care of [place], DNP, AGNP-C Adult & Gerontological Nurse Practitioner [facility]   01/16/2020 1:15 PM   Disclaimer: This note was dictated with voice recognition software. Similar sounding words can inadvertently be transcribed and may not be corrected upon review.

## 2020-03-11 ENCOUNTER — Other Ambulatory Visit: Payer: Self-pay

## 2020-03-11 ENCOUNTER — Encounter: Payer: Self-pay | Admitting: Nurse Practitioner

## 2020-03-11 ENCOUNTER — Ambulatory Visit (INDEPENDENT_AMBULATORY_CARE_PROVIDER_SITE_OTHER): Payer: Medicare HMO | Admitting: Nurse Practitioner

## 2020-03-11 VITALS — BP 144/76 | HR 106 | Temp 97.6°F | Ht 63.0 in | Wt 201.4 lb

## 2020-03-11 DIAGNOSIS — E1159 Type 2 diabetes mellitus with other circulatory complications: Secondary | ICD-10-CM | POA: Diagnosis not present

## 2020-03-11 DIAGNOSIS — Z86010 Personal history of colonic polyps: Secondary | ICD-10-CM | POA: Insufficient documentation

## 2020-03-11 DIAGNOSIS — D126 Benign neoplasm of colon, unspecified: Secondary | ICD-10-CM | POA: Diagnosis not present

## 2020-03-11 NOTE — Patient Instructions (Signed)
Your health issues we discussed today were:   Need for colonoscopy: 1. We will schedule your colonoscopy for you 2. Further recommendations will follow your colonoscopy 3. Call us if you have any problems prior to your procedure  Overall I recommend:  1. Continue your other current medications 2. Return for follow-up based on post procedure recommendations or as needed for any GI problems 3. Call us for any questions or concerns   ---------------------------------------------------------------  At [facility] we value your feedback. You may receive a survey about your visit today. Please share your experience as we strive to create trusting relationships with our patients to provide genuine, compassionate, quality care.  We appreciate your understanding and patience as we review any laboratory studies, imaging, and other diagnostic tests that are ordered as we care for you. Our office policy is 5 business days for review of these results, and any emergent or urgent results are addressed in a timely manner for your best interest. If you do not hear from our office in 1 week, please contact us.   We also encourage the use of MyChart, which contains your medical information for your review as well. If you are not enrolled in this feature, an access code is on this after visit summary for your convenience. Thank you for allowing [place] to be involved in your care.  It was great to see you today!  I hope you have a GREAT New Year!!    ---------------------------------------------------------------

## 2020-03-11 NOTE — Progress Notes (Signed)
Primary Care Physician:  Fayrene Helper, MD Primary Gastroenterologist:  Dr. Abbey Chatters  Chief Complaint  Patient presents with  . Consult    TCS due    HPI:   Tiffany Jacobs is a 75 y.o. female who presents for 3-year repeat colonoscopy.  The patient previous colonoscopy was completed 10/20/2016 after nurse/phone triage.  Findings included three 5 to 7 mm polyps in the descending colon and splenic flexure, single 3 mm polyp in the mid ascending colon, and three 5 to 6 mm polyps in the rectum.  Also found external and internal hemorrhoids.  Surgical pathology found the polyps to be a mix of hyperplastic and tubular adenoma.  Recommended repeat colonoscopy in 3 years (2021).  Today she states she is doing okay overall. Deneis abdominal pain, N/V, hematochezia, melena, fever, chills, unintentional weight loss. Denies URI or flu-like symptoms. Denies loss of sense of taste or smell. The patient has not received COVID-19 vaccination(s). They are not interested in vaccine scheduling information. Denies chest pain, dyspnea, dizziness, lightheadedness, syncope, near syncope. Denies any other upper or lower GI symptoms.  Past Medical History:  Diagnosis Date  . Arthritis   . Asthma 1963  . Chest pain with high risk for cardiac etiology 08/21/2017  . Diabetes ([place])   . Diabetes mellitus, type 2 ([place]) 2004  . GERD (gastroesophageal reflux disease) 2004  . Hypertension 2004  . Low back pain   . Obesity   . RAD (reactive airway disease)   . Sciatica   . Seasonal allergies     Past Surgical History:  Procedure Laterality Date  . ACROMIO-CLAVICULAR JOINT REPAIR Right 11/09/2012   Procedure: ACROMIO-CLAVICULAR JOINT REPAIR;  Surgeon: Carole Civil, MD;  Location: [facility];  Service: Orthopedics;  Laterality: Right;  . COLONOSCOPY N/A 10/10/2016   Procedure: COLONOSCOPY;  Surgeon: Danie Binder, MD;  [facility];  Service: Endoscopy;  Laterality: N/A;  10:30  . EYE SURGERY  Right 05/09/2018   cataract  . left breast biopsy for benign disease    . SHOULDER ACROMIOPLASTY Right 11/09/2012   Procedure: RIGHT SHOULDER ACROMIOPLASTY;  Surgeon: Carole Civil, MD;  Location: [facility];  Service: Orthopedics;  Laterality: Right;  . SHOULDER OPEN ROTATOR CUFF REPAIR Right 11/09/2012   Procedure: ROTATOR CUFF REPAIR Right SHOULDER OPEN;  Surgeon: Carole Civil, MD;  Location: [facility];  Service: Orthopedics;  Laterality: Right;  . total knee arthroplasty left  02-01-05   Dr. Aline Brochure  . TUBAL LIGATION  1970    Current Outpatient Medications  Medication Sig Dispense Refill  . albuterol (PROVENTIL) (2.5 MG/3ML) 0.083% nebulizer solution Take 2.5 mg by nebulization every 6 (six) hours as needed for wheezing or shortness of breath.     Marland Kitchen albuterol (VENTOLIN HFA) 108 (90 Base) MCG/ACT inhaler Inhale 2 puffs into the lungs every 6 (six) hours as needed for wheezing or shortness of breath. 18 g 3  . amLODipine (NORVASC) 10 MG tablet Take 1 tablet (10 mg total) by mouth daily. 90 tablet 3  . atorvastatin (LIPITOR) 20 MG tablet Take 1 tablet (20 mg total) by mouth daily. 90 tablet 3  . blood glucose meter kit and supplies Two times daily testing (dispense meter based on insurance preference) dx e11.65 1 each 0  . fluticasone (FLONASE) 50 MCG/ACT nasal spray Place 2 sprays into both nostrils daily. 48 g 1  . gabapentin (NEURONTIN) 300 MG capsule Take 1 capsule (300 mg total) by mouth at  bedtime. 90 capsule 1  . glipiZIDE (GLUCOTROL XL) 10 MG 24 hr tablet TAKE 2 TABLETS EVERY DAY WITH BREAKFAST 180 tablet 1  . glucose blood test strip Use as instructed once daily dx E11.9 50 each 5  . hydrALAZINE (APRESOLINE) 25 MG tablet Take 1 tablet (25 mg total) by mouth 3 (three) times daily. 270 tablet 1  . insulin glargine (LANTUS SOLOSTAR) 100 UNIT/ML Solostar Pen Inject 50 Units into the skin daily. 15 pen PRN  . Insulin Pen Needle 31G X 5 MM MISC To be used with Lantus insulin pen.  Dispensed based on patient's insurance coverage. Please make sure these fit on the patients lantus pen 100 each 2  . Insulin Pen Needle 31G X 5 MM MISC To be used with Lantus insulin pen. Dispensed based on patient's insurance coverage. Please make sure these fit on the patients lantus pen 100 each 5  . latanoprost (XALATAN) 0.005 % ophthalmic solution SMARTSIG:1 Drop(s) In Eye(s) Every Evening    . metFORMIN (GLUCOPHAGE) 1000 MG tablet TAKE 1 TABLET TWICE DAILY WITH A MEAL 180 tablet 1  . metoprolol tartrate (LOPRESSOR) 25 MG tablet Take 25 mg by mouth 2 (two) times daily.    . montelukast (SINGULAIR) 10 MG tablet Take 1 tablet (10 mg total) by mouth at bedtime. 90 tablet 1  . naproxen (NAPROSYN) 500 MG tablet TAKE ONE TABLET ORALLY TWICE DAILY WITH MEALS. 60 tablet 0  . nitroGLYCERIN (NITROSTAT) 0.4 MG SL tablet DISSOLVE ONE TABLET UNDER THE TONGUE EVERY 5 MINUTES AS NEEDED FOR CHEST PAIN. DO NOT EXCEED A TOTAL OF 3 DOSES IN 15 MINUTES  0  . pantoprazole (PROTONIX) 20 MG tablet Take 1 tablet (20 mg total) by mouth daily. 90 tablet 1  . spironolactone (ALDACTONE) 25 MG tablet Take 1 tablet (25 mg total) by mouth daily. 90 tablet 1  . traZODone (DESYREL) 100 MG tablet Take 1 tablet (100 mg total) by mouth at bedtime. 90 tablet 1  . TRUEplus Lancets 33G MISC TEST TWO TIMES DAILY 200 each 0   No current facility-administered medications for this visit.    Allergies as of 03/11/2020 - Review Complete 03/11/2020  Allergen Reaction Noted  . Ace inhibitors Cough 11/04/2011  . Latex Itching and Rash 05/04/2014    Family History  Problem Relation Age of Onset  . Kidney failure Father   . Kidney disease Father   . Stroke Brother 41       used drugs , stroke and heart disease  . Diabetes Brother   . Hypertension Brother   . Cancer Daughter 51       breast   . Diabetes Mother   . Asthma Other   . Lung disease Other   . Cancer Other   . Heart disease Other   . Arthritis Other   . Colon  cancer Neg Hx   . Colon polyps Neg Hx     Social History   Socioeconomic History  . Marital status: Married    Spouse name: Jeneen Rinks   . Number of children: 7  . Years of education: 8  . Highest education level: GED or equivalent  Occupational History  . Occupation: disabled   Tobacco Use  . Smoking status: Former Smoker    Packs/day: 0.50    Years: 37.00    Pack years: 18.50    Types: Cigarettes    Quit date: 10/12/1988    Years since quitting: 31.4  . Smokeless tobacco: Never Used  Vaping  Use  . Vaping Use: Never used  Substance and Sexual Activity  . Alcohol use: No  . Drug use: No  . Sexual activity: Not Currently    Birth control/protection: Post-menopausal  Other Topics Concern  . Not on file  Social History Narrative  . Not on file   Social Determinants of Health   Financial Resource Strain: Medium Risk  . Difficulty of Paying Living Expenses: Somewhat hard  Food Insecurity: No Food Insecurity  . Worried About Charity fundraiser in the Last Year: Never true  . Ran Out of Food in the Last Year: Never true  Transportation Needs: No Transportation Needs  . Lack of Transportation (Medical): No  . Lack of Transportation (Non-Medical): No  Physical Activity: Sufficiently Active  . Days of Exercise per Week: 5 days  . Minutes of Exercise per Session: 60 min  Stress: No Stress Concern Present  . Feeling of Stress : Not at all  Social Connections: Socially Isolated  . Frequency of Communication with Friends and Family: More than three times a week  . Frequency of Social Gatherings with Friends and Family: More than three times a week  . Attends Religious Services: Never  . Active Member of Clubs or Organizations: No  . Attends Archivist Meetings: Never  . Marital Status: Widowed  Intimate Partner Violence: Not At Risk  . Fear of Current or Ex-Partner: No  . Emotionally Abused: No  . Physically Abused: No  . Sexually Abused: No     Subjective: Review of Systems  Constitutional: Negative for chills, fever, malaise/fatigue and weight loss.  HENT: Negative for congestion and sore throat.   Respiratory: Negative for cough and shortness of breath.   Cardiovascular: Negative for chest pain and palpitations.  Gastrointestinal: Negative for abdominal pain, blood in stool, constipation, diarrhea, heartburn, melena, nausea and vomiting.  Musculoskeletal: Negative for joint pain and myalgias.  Skin: Negative for rash.  Neurological: Negative for dizziness and weakness.  Endo/Heme/Allergies: Does not bruise/bleed easily.  Psychiatric/Behavioral: Negative for depression. The patient is not nervous/anxious.   All other systems reviewed and are negative.      Objective: BP (!) 144/76   Pulse (!) 106   Temp 97.6 F (36.4 C)   Ht _0  (1.6 m)   Wt 201 lb 6.4 oz (91.4 kg)   BMI 35.68 kg/m  Physical Exam Vitals and nursing note reviewed.  Constitutional:      General: She is not in acute distress.    Appearance: Normal appearance. She is well-developed. She is obese. She is not ill-appearing, toxic-appearing or diaphoretic.  HENT:     Head: Normocephalic and atraumatic.     Nose: No congestion or rhinorrhea.  Eyes:     General: No scleral icterus. Cardiovascular:     Rate and Rhythm: Normal rate and regular rhythm.     Heart sounds: Normal heart sounds.  Pulmonary:     Effort: Pulmonary effort is normal. No respiratory distress.     Breath sounds: Normal breath sounds.  Abdominal:     General: Bowel sounds are normal.     Palpations: Abdomen is soft. There is no hepatomegaly, splenomegaly or mass.     Tenderness: There is no abdominal tenderness. There is no guarding or rebound.     Hernia: No hernia is present.  Skin:    General: Skin is warm and dry.     Coloration: Skin is not jaundiced.     Findings: No rash.  Neurological:     General: No focal deficit present.     Mental Status: She is alert and  oriented to person, place, and time.  Psychiatric:        Attention and Perception: Attention normal.        Mood and Affect: Mood normal.        Speech: Speech normal.        Behavior: Behavior normal.        Thought Content: Thought content normal.        Cognition and Memory: Cognition and memory normal.      Assessment:  Very pleasant 75 year old female presents for 3-year repeat colonoscopy.  Last colonoscopy completed about 3 years ago with multiple polyps as per HPI.  Recommended 3-year repeat and she is here to schedule today.  No red flag/warning signs or symptoms.  Denies any overt GI complaints.  We will need to make some adjustments to her diabetes medications prior to procedure.  We will proceed with scheduling at this time.  Proceed with TCS with Dr. Abbey Chatters on propofol/MAC in near future: the risks, benefits, and alternatives have been discussed with the patient in detail. The patient states understanding and desires to proceed.  ASA III (DM with a1c 7.5; Asthma)  The patient is currently in good control, Lantus, Glucophage.  We will have her hold all of these the morning of her procedure.   Plan: 1. Colonoscopy as noted above 2. Return for follow-up based on post procedure recommendations or as needed for GI concerns    Thank you for allowing [place] to participate in the care of [place], DNP, AGNP-C Adult & Gerontological Nurse Practitioner [facility]   03/11/2020 3:54 PM   Disclaimer: This note was dictated with voice recognition software. Similar sounding words can inadvertently be transcribed and may not be corrected upon review.

## 2020-03-11 NOTE — Progress Notes (Signed)
Cc'ed to pcp °

## 2020-03-12 ENCOUNTER — Telehealth: Payer: Self-pay

## 2020-03-12 ENCOUNTER — Other Ambulatory Visit: Payer: Self-pay | Admitting: Orthopedic Surgery

## 2020-03-12 DIAGNOSIS — M25512 Pain in left shoulder: Secondary | ICD-10-CM

## 2020-03-12 DIAGNOSIS — G8929 Other chronic pain: Secondary | ICD-10-CM

## 2020-03-12 NOTE — Telephone Encounter (Signed)
Pt gave [employer] card at check in yesterday. Unable to pull up pt on [employer]. Called [place], spoke to [place], she has no active policy. Policy termed 03/06/20. Misty Stanley checked coverage and shows she now has [employer].  Tried to call pt, no answer, no answering machine.

## 2020-03-12 NOTE — Telephone Encounter (Signed)
[facility] faxed request (fax [phone], ph [phone], for medication refill: naproxen (NAPROSYN) 500 MG tablet 60 tablet  - patient also uses [facility]; therefore, I called patient to verify refill information, and her voice mail is full.

## 2020-03-13 MED ORDER — NAPROXEN 500 MG PO TABS
ORAL_TABLET | ORAL | 0 refills | Status: DC
Start: 2020-03-13 — End: 2020-05-11

## 2020-03-16 ENCOUNTER — Other Ambulatory Visit: Payer: Self-pay

## 2020-03-16 MED ORDER — BD SWAB SINGLE USE REGULAR PADS: MEDICATED_PAD | 6 refills | Status: AC

## 2020-03-16 MED ORDER — GLUCOSE BLOOD VI STRP: ORAL_STRIP | 5 refills | Status: AC

## 2020-03-27 ENCOUNTER — Telehealth: Payer: Self-pay

## 2020-03-27 NOTE — Telephone Encounter (Signed)
Pt LVM that she needs a RX sent to [facility], per [facility]  She did not leave what medication

## 2020-03-27 NOTE — Telephone Encounter (Signed)
Called pt to see what Rx she needed, voicemail box full.

## 2020-03-31 ENCOUNTER — Telehealth: Payer: Self-pay | Admitting: Internal Medicine

## 2020-03-31 NOTE — Telephone Encounter (Signed)
Noted. Called endo and made aware to cancel

## 2020-03-31 NOTE — Telephone Encounter (Signed)
Pt wants to cancel her procedure with Dr Abbey Chatters on 04/07/2020 due to losing her insurance. She will call back to reschedule.

## 2020-03-31 NOTE — Telephone Encounter (Signed)
Voicemail would not pick up, unable to leave message.

## 2020-04-02 NOTE — Telephone Encounter (Signed)
Left detailed voicemail for pt to call the office.

## 2020-04-03 ENCOUNTER — Encounter (HOSPITAL_COMMUNITY): Payer: Medicare HMO

## 2020-04-03 ENCOUNTER — Other Ambulatory Visit (HOSPITAL_COMMUNITY): Payer: MEDICARE

## 2020-04-07 ENCOUNTER — Encounter (HOSPITAL_COMMUNITY): Admission: RE | Payer: Self-pay | Source: Home / Self Care

## 2020-04-07 ENCOUNTER — Ambulatory Visit (HOSPITAL_COMMUNITY): Admission: RE | Admit: 2020-04-07 | Payer: Medicare HMO | Source: Home / Self Care

## 2020-04-07 SURGERY — COLONOSCOPY WITH PROPOFOL
Anesthesia: Monitor Anesthesia Care

## 2020-04-22 ENCOUNTER — Ambulatory Visit: Payer: Medicare HMO | Admitting: Family Medicine

## 2020-04-30 ENCOUNTER — Ambulatory Visit: Payer: Medicare HMO | Admitting: Internal Medicine

## 2020-05-11 ENCOUNTER — Ambulatory Visit (INDEPENDENT_AMBULATORY_CARE_PROVIDER_SITE_OTHER): Payer: Medicare HMO | Admitting: Internal Medicine

## 2020-05-11 ENCOUNTER — Other Ambulatory Visit: Payer: Self-pay

## 2020-05-11 ENCOUNTER — Encounter: Payer: Self-pay | Admitting: Internal Medicine

## 2020-05-11 VITALS — BP 149/78 | HR 104 | Resp 18 | Ht 62.5 in | Wt 197.1 lb

## 2020-05-11 DIAGNOSIS — K219 Gastro-esophageal reflux disease without esophagitis: Secondary | ICD-10-CM | POA: Diagnosis not present

## 2020-05-11 DIAGNOSIS — E782 Mixed hyperlipidemia: Secondary | ICD-10-CM | POA: Diagnosis not present

## 2020-05-11 DIAGNOSIS — M5010 Cervical disc disorder with radiculopathy, unspecified cervical region: Secondary | ICD-10-CM | POA: Diagnosis not present

## 2020-05-11 DIAGNOSIS — F419 Anxiety disorder, unspecified: Secondary | ICD-10-CM

## 2020-05-11 DIAGNOSIS — E1165 Type 2 diabetes mellitus with hyperglycemia: Secondary | ICD-10-CM | POA: Diagnosis not present

## 2020-05-11 DIAGNOSIS — I10 Essential (primary) hypertension: Secondary | ICD-10-CM

## 2020-05-11 DIAGNOSIS — IMO0002 Reserved for concepts with insufficient information to code with codable children: Secondary | ICD-10-CM

## 2020-05-11 DIAGNOSIS — E1159 Type 2 diabetes mellitus with other circulatory complications: Secondary | ICD-10-CM

## 2020-05-11 DIAGNOSIS — E118 Type 2 diabetes mellitus with unspecified complications: Secondary | ICD-10-CM | POA: Diagnosis not present

## 2020-05-11 LAB — POCT GLYCOSYLATED HEMOGLOBIN (HGB A1C): HbA1c, POC (controlled diabetic range): 9.9 % — AB (ref 0.0–7.0)

## 2020-05-11 MED ORDER — SPIRONOLACTONE 25 MG PO TABS: 25.0000 mg | ORAL_TABLET | Freq: Every day | ORAL | 1 refills | Status: DC

## 2020-05-11 MED ORDER — ESCITALOPRAM OXALATE 10 MG PO TABS: 10.0000 mg | ORAL_TABLET | Freq: Every day | ORAL | 2 refills | Status: DC

## 2020-05-11 MED ORDER — GLIPIZIDE ER 10 MG PO TB24: 10.0000 mg | ORAL_TABLET | Freq: Every day | ORAL | 1 refills | Status: DC

## 2020-05-11 MED ORDER — PANTOPRAZOLE SODIUM 20 MG PO TBEC: 20.0000 mg | DELAYED_RELEASE_TABLET | Freq: Every day | ORAL | 1 refills | Status: DC

## 2020-05-11 MED ORDER — GABAPENTIN 300 MG PO CAPS: 300.0000 mg | ORAL_CAPSULE | Freq: Every day | ORAL | 1 refills | Status: DC

## 2020-05-11 MED ORDER — ATORVASTATIN CALCIUM 20 MG PO TABS: 20.0000 mg | ORAL_TABLET | Freq: Every day | ORAL | 3 refills | Status: DC

## 2020-05-11 NOTE — Assessment & Plan Note (Signed)
HbA1C: 9.9 today in office On Metformin and Lantus 50 U qHS Refilled Glipizide Advised to follow diabetic diet On statin Advised to follow up with Dr Dorris Fetch

## 2020-05-11 NOTE — Assessment & Plan Note (Signed)
Mentions having episodes of anxiety at times Sleep improved, was on Trazodone in the past Started Lexapro 10 mg QD

## 2020-05-11 NOTE — Assessment & Plan Note (Signed)
On Pantoprazole, refills provided

## 2020-05-11 NOTE — Assessment & Plan Note (Signed)
BP Readings from Last 1 Encounters:  05/11/20 (!) 149/78   uncontrolled with Amlodipine Refilled Spironolactone DC Metoprolol and Hydralazine as patient's BP not very high even only with Amlodipine Counseled for compliance with the medications Advised DASH diet and moderate exercise/walking as tolerated

## 2020-05-11 NOTE — Patient Instructions (Addendum)
Please start taking medications as prescribed.  Please contact Dr Liliane Channel office for diabetes management. Please continue to check blood glucose regularly and bring the log in the next visit.  Please continue to follow low salt and low carbohydrate diet. [email or web address]  DASH Eating Plan DASH stands for Dietary Approaches to Stop Hypertension. The DASH eating plan is a healthy eating plan that has been shown to:  Reduce high blood pressure (hypertension).  Reduce your risk for type 2 diabetes, heart disease, and stroke.  Help with weight loss. What are tips for following this plan? Reading food labels  Check food labels for the amount of salt (sodium) per serving. Choose foods with less than 5 percent of the Daily Value of sodium. Generally, foods with less than 300 milligrams (mg) of sodium per serving fit into this eating plan.  To find whole grains, look for the word "whole" as the first word in the ingredient list. Shopping  Buy products labeled as "low-sodium" or "no salt added."  Buy fresh foods. Avoid canned foods and pre-made or frozen meals. Cooking  Avoid adding salt when cooking. Use salt-free seasonings or herbs instead of table salt or sea salt. Check with your health care provider or pharmacist before using salt substitutes.  Do not fry foods. Cook foods using healthy methods such as baking, boiling, grilling, roasting, and broiling instead.  Cook with heart-healthy oils, such as olive, canola, avocado, soybean, or sunflower oil. Meal planning  Eat a balanced diet that includes: ? 4 or more servings of fruits and 4 or more servings of vegetables each day. Try to fill one-half of your plate with fruits and vegetables. ? 6-8 servings of whole grains each day. ? Less than 6 oz (170 g) of lean meat, poultry, or fish each day. A 3-oz (85-g) serving of meat is about the same size as a deck of cards. One egg equals 1 oz (28  g). ? 2-3 servings of low-fat dairy each day. One serving is 1 cup (237 mL). ? 1 serving of nuts, seeds, or beans 5 times each week. ? 2-3 servings of heart-healthy fats. Healthy fats called omega-3 fatty acids are found in foods such as walnuts, flaxseeds, fortified milks, and eggs. These fats are also found in cold-water fish, such as sardines, salmon, and mackerel.  Limit how much you eat of: ? Canned or prepackaged foods. ? Food that is high in trans fat, such as some fried foods. ? Food that is high in saturated fat, such as fatty meat. ? Desserts and other sweets, sugary drinks, and other foods with added sugar. ? Full-fat dairy products.  Do not salt foods before eating.  Do not eat more than 4 egg yolks a week.  Try to eat at least 2 vegetarian meals a week.  Eat more home-cooked food and less restaurant, buffet, and fast food.   Lifestyle  When eating at a restaurant, ask that your food be prepared with less salt or no salt, if possible.  If you drink alcohol: ? Limit how much you use to:  0-1 drink a day for women who are not pregnant.  0-2 drinks a day for men. ? Be aware of how much alcohol is in your drink. In the [place] one drink equals one 12 oz bottle of beer (355 mL), one 5 oz glass of wine (148 mL), or one 1 oz glass of hard liquor (44 mL). General information  Avoid eating more than 2,300 mg of salt a day.  If you have hypertension, you may need to reduce your sodium intake to 1,500 mg a day.  Work with your health care provider to maintain a healthy body weight or to lose weight. Ask what an ideal weight is for you.  Get at least 30 minutes of exercise that causes your heart to beat faster (aerobic exercise) most days of the week. Activities may include walking, swimming, or biking.  Work with your health care provider or dietitian to adjust your eating plan to your individual calorie needs. What foods should I eat? Fruits All fresh, dried, or frozen fruit.  Canned fruit in natural juice (without added sugar). Vegetables Fresh or frozen vegetables (raw, steamed, roasted, or grilled). Low-sodium or reduced-sodium tomato and vegetable juice. Low-sodium or reduced-sodium tomato sauce and tomato paste. Low-sodium or reduced-sodium canned vegetables. Grains Whole-grain or whole-wheat bread. Whole-grain or whole-wheat pasta. Brown rice. [place]. Bulgur. Whole-grain and low-sodium cereals. Pita bread. Low-fat, low-sodium crackers. Whole-wheat flour tortillas. Meats and other proteins Skinless chicken or [place]. Ground chicken or [place]. Pork with fat trimmed off. Fish and seafood. Egg whites. Dried beans, peas, or lentils. Unsalted nuts, nut butters, and seeds. Unsalted canned beans. Lean cuts of beef with fat trimmed off. Low-sodium, lean precooked or cured meat, such as sausages or meat loaves. Dairy Low-fat (1%) or fat-free (skim) milk. Reduced-fat, low-fat, or fat-free cheeses. Nonfat, low-sodium ricotta or cottage cheese. Low-fat or nonfat yogurt. Low-fat, low-sodium cheese. Fats and oils Soft margarine without trans fats. Vegetable oil. Reduced-fat, low-fat, or light mayonnaise and salad dressings (reduced-sodium). Canola, safflower, olive, avocado, soybean, and sunflower oils. Avocado. Seasonings and condiments Herbs. Spices. Seasoning mixes without salt. Other foods Unsalted popcorn and pretzels. Fat-free sweets. The items listed above may not be a complete list of foods and beverages you can eat. Contact a dietitian for more information. What foods should I avoid? Fruits Canned fruit in a light or heavy syrup. Fried fruit. Fruit in cream or butter sauce. Vegetables Creamed or fried vegetables. Vegetables in a cheese sauce. Regular canned vegetables (not low-sodium or reduced-sodium). Regular canned tomato sauce and paste (not low-sodium or reduced-sodium). Regular tomato and vegetable juice (not low-sodium or reduced-sodium). Angie Fava.  Olives. Grains Baked goods made with fat, such as croissants, muffins, or some breads. Dry pasta or rice meal packs. Meats and other proteins Fatty cuts of meat. Ribs. Fried meat. Berniece Salines. Bologna, salami, and other precooked or cured meats, such as sausages or meat loaves. Fat from the back of a pig (fatback). Bratwurst. Salted nuts and seeds. Canned beans with added salt. Canned or smoked fish. Whole eggs or egg yolks. Chicken or [place] with skin. Dairy Whole or 2% milk, cream, and half-and-half. Whole or full-fat cream cheese. Whole-fat or sweetened yogurt. Full-fat cheese. Nondairy creamers. Whipped toppings. Processed cheese and cheese spreads. Fats and oils Butter. Stick margarine. Lard. Shortening. Ghee. Bacon fat. Tropical oils, such as coconut, palm kernel, or palm oil. Seasonings and condiments Onion salt, garlic salt, seasoned salt, table salt, and sea salt. Worcestershire sauce. Tartar sauce. Barbecue sauce. Teriyaki sauce. Soy sauce, including reduced-sodium. Steak sauce. Canned and packaged gravies. Fish sauce. Oyster sauce. Cocktail sauce. Store-bought horseradish. Ketchup. Mustard. Meat flavorings and tenderizers. Bouillon cubes. Hot sauces. Pre-made or packaged marinades. Pre-made or packaged taco seasonings. Relishes. Regular salad dressings. Other foods Salted popcorn and pretzels. The items listed above may not be a complete list of foods and beverages you should avoid. Contact a dietitian for more information. Where to find more information  National Heart, Lung, and Blood Institute: https://wilson-eaton.com/  American Heart Association: www.heart.org  Academy of Nutrition and Dietetics: www.eatright.[place]: www.kidney.org Summary  The DASH eating plan is a healthy eating plan that has been shown to reduce high blood pressure (hypertension). It may also reduce your risk for type 2 diabetes, heart disease, and stroke.  When on the DASH eating plan, aim to  eat more fresh fruits and vegetables, whole grains, lean proteins, low-fat dairy, and heart-healthy fats.  With the DASH eating plan, you should limit salt (sodium) intake to 2,300 mg a day. If you have hypertension, you may need to reduce your sodium intake to 1,500 mg a day.  Work with your health care provider or dietitian to adjust your eating plan to your individual calorie needs. This information is not intended to replace advice given to you by your health care provider. Make sure you discuss any questions you have with your health care provider. Document Revised: 01/25/2019 Document Reviewed: 01/25/2019 Elsevier Patient Education  2021 [employer].

## 2020-05-11 NOTE — Assessment & Plan Note (Signed)
Ran out of Atorvastatin, refills provided

## 2020-05-11 NOTE — Progress Notes (Signed)
Established Patient Office Visit  Subjective:  Patient ID: Tiffany Jacobs, female    DOB: 05/20/1945  Age: 75 y.o. MRN: [id number]  CC:  Chief Complaint  Patient presents with  . Follow-up    Follow up re eval bp also pt has been out of meds since oct was a mix up with the insurance     HPI Tiffany Jacobs presents for follow up of her chronic medical conditions. She has only Amlodipine and Metformin at home, and has run out of her other medications.  HTN: BP is uncontrolled. She only takes Amlodipine currently, has trun out of Spironolactone, Metoprolol and Hydralazine. Patient denies headache, dizziness, chest pain, dyspnea or palpitations.  DM: HbA1C was 9.9. She is taking Metformin and Lantus 50 U qHS. She had run out of pen needles and could not take Lantus for long time. She recently received pen needles and has started taking Lantus now. Home blood glucose readings have been around 200s, but around 300s at times. She denies any polyuria or polydipsia. She requests refills for Gabapentin for diabetic neuropathy.  Anxiety: She has been having spells of anxiety, but denies anhedonia, sleep difficulty, recent weight or appetite changes, SI or HI.   Past Medical History:  Diagnosis Date  . Arthritis   . Asthma 1963  . Chest pain with high risk for cardiac etiology 08/21/2017  . Diabetes ([place])   . Diabetes mellitus, type 2 ([place]) 2004  . GERD (gastroesophageal reflux disease) 2004  . Hypertension 2004  . Low back pain   . Obesity   . RAD (reactive airway disease)   . Sciatica   . Seasonal allergies     Past Surgical History:  Procedure Laterality Date  . ACROMIO-CLAVICULAR JOINT REPAIR Right 11/09/2012   Procedure: ACROMIO-CLAVICULAR JOINT REPAIR;  Surgeon: Carole Civil, MD;  Location: [facility];  Service: Orthopedics;  Laterality: Right;  . COLONOSCOPY N/A 10/10/2016   Procedure: COLONOSCOPY;  Surgeon: Danie Binder, MD;  Location: [facility];  Service:  Endoscopy;  Laterality: N/A;  10:30  . EYE SURGERY Right 05/09/2018   cataract  . left breast biopsy for benign disease    . SHOULDER ACROMIOPLASTY Right 11/09/2012   Procedure: RIGHT SHOULDER ACROMIOPLASTY;  Surgeon: Carole Civil, MD;  Location: [facility];  Service: Orthopedics;  Laterality: Right;  . SHOULDER OPEN ROTATOR CUFF REPAIR Right 11/09/2012   Procedure: ROTATOR CUFF REPAIR Right SHOULDER OPEN;  Surgeon: Carole Civil, MD;  Location: [facility];  Service: Orthopedics;  Laterality: Right;  . total knee arthroplasty left  02-01-05   Dr. Aline Brochure  . TUBAL LIGATION  1970    Family History  Problem Relation Age of Onset  . Kidney failure Father   . Kidney disease Father   . Stroke Brother 14       used drugs , stroke and heart disease  . Diabetes Brother   . Hypertension Brother   . Cancer Daughter 50       breast   . Diabetes Mother   . Asthma Other   . Lung disease Other   . Cancer Other   . Heart disease Other   . Arthritis Other   . Colon cancer Neg Hx   . Colon polyps Neg Hx     Social History   Socioeconomic History  . Marital status: Married    Spouse name: Jeneen Rinks   . Number of children: 7  . Years of education: 8  . Highest  education level: GED or equivalent  Occupational History  . Occupation: disabled   Tobacco Use  . Smoking status: Former Smoker    Packs/day: 0.50    Years: 37.00    Pack years: 18.50    Types: Cigarettes    Quit date: 10/12/1988    Years since quitting: 31.6  . Smokeless tobacco: Never Used  Vaping Use  . Vaping Use: Never used  Substance and Sexual Activity  . Alcohol use: No  . Drug use: No  . Sexual activity: Not Currently    Birth control/protection: Post-menopausal  Other Topics Concern  . Not on file  Social History Narrative  . Not on file   Social Determinants of Health   Financial Resource Strain: Medium Risk  . Difficulty of Paying Living Expenses: Somewhat hard  Food Insecurity: No Food Insecurity  .  Worried About Charity fundraiser in the Last Year: Never true  . Ran Out of Food in the Last Year: Never true  Transportation Needs: No Transportation Needs  . Lack of Transportation (Medical): No  . Lack of Transportation (Non-Medical): No  Physical Activity: Sufficiently Active  . Days of Exercise per Week: 5 days  . Minutes of Exercise per Session: 60 min  Stress: No Stress Concern Present  . Feeling of Stress : Not at all  Social Connections: Socially Isolated  . Frequency of Communication with Friends and Family: More than three times a week  . Frequency of Social Gatherings with Friends and Family: More than three times a week  . Attends Religious Services: Never  . Active Member of Clubs or Organizations: No  . Attends Archivist Meetings: Never  . Marital Status: Widowed  Intimate Partner Violence: Not At Risk  . Fear of Current or Ex-Partner: No  . Emotionally Abused: No  . Physically Abused: No  . Sexually Abused: No    Outpatient Medications Prior to Visit  Medication Sig Dispense Refill  . albuterol (PROVENTIL) (2.5 MG/3ML) 0.083% nebulizer solution Take 2.5 mg by nebulization every 6 (six) hours as needed for wheezing or shortness of breath.     Marland Kitchen albuterol (VENTOLIN HFA) 108 (90 Base) MCG/ACT inhaler Inhale 2 puffs into the lungs every 6 (six) hours as needed for wheezing or shortness of breath. 18 g 3  . Alcohol Swabs (B-D SINGLE USE SWABS REGULAR) PADS Use to test as directed 100 each 6  . amLODipine (NORVASC) 10 MG tablet Take 1 tablet (10 mg total) by mouth daily. 90 tablet 3  . blood glucose meter kit and supplies Two times daily testing (dispense meter based on insurance preference) dx e11.65 1 each 0  . fluticasone (FLONASE) 50 MCG/ACT nasal spray Place 2 sprays into both nostrils daily. 48 g 1  . glucose blood test strip Use as instructed once daily dx E11.9 50 each 5  . insulin glargine (LANTUS SOLOSTAR) 100 UNIT/ML Solostar Pen Inject 50 Units into  the skin daily. 15 pen PRN  . Insulin Pen Needle 31G X 5 MM MISC To be used with Lantus insulin pen. Dispensed based on patient's insurance coverage. Please make sure these fit on the patients lantus pen 100 each 2  . Insulin Pen Needle 31G X 5 MM MISC To be used with Lantus insulin pen. Dispensed based on patient's insurance coverage. Please make sure these fit on the patients lantus pen 100 each 5  . latanoprost (XALATAN) 0.005 % ophthalmic solution SMARTSIG:1 Drop(s) In Eye(s) Every Evening    .  metFORMIN (GLUCOPHAGE) 1000 MG tablet TAKE 1 TABLET TWICE DAILY WITH A MEAL 180 tablet 1  . nitroGLYCERIN (NITROSTAT) 0.4 MG SL tablet DISSOLVE ONE TABLET UNDER THE TONGUE EVERY 5 MINUTES AS NEEDED FOR CHEST PAIN. DO NOT EXCEED A TOTAL OF 3 DOSES IN 15 MINUTES  0  . TRUEplus Lancets 33G MISC TEST TWO TIMES DAILY 200 each 0  . atorvastatin (LIPITOR) 20 MG tablet Take 1 tablet (20 mg total) by mouth daily. 90 tablet 3  . naproxen (NAPROSYN) 500 MG tablet TAKE ONE TABLET ORALLY TWICE DAILY WITH MEALS. 180 tablet 0  . pantoprazole (PROTONIX) 20 MG tablet Take 1 tablet (20 mg total) by mouth daily. 90 tablet 1  . montelukast (SINGULAIR) 10 MG tablet Take 1 tablet (10 mg total) by mouth at bedtime. (Patient not taking: Reported on 05/11/2020) 90 tablet 1  . gabapentin (NEURONTIN) 300 MG capsule Take 1 capsule (300 mg total) by mouth at bedtime. (Patient not taking: Reported on 05/11/2020) 90 capsule 1  . glipiZIDE (GLUCOTROL XL) 10 MG 24 hr tablet TAKE 2 TABLETS EVERY DAY WITH BREAKFAST (Patient not taking: Reported on 05/11/2020) 180 tablet 1  . hydrALAZINE (APRESOLINE) 25 MG tablet Take 1 tablet (25 mg total) by mouth 3 (three) times daily. (Patient not taking: Reported on 05/11/2020) 270 tablet 1  . metoprolol tartrate (LOPRESSOR) 25 MG tablet Take 25 mg by mouth 2 (two) times daily.    Marland Kitchen spironolactone (ALDACTONE) 25 MG tablet Take 1 tablet (25 mg total) by mouth daily. 90 tablet 1  . traZODone (DESYREL) 100 MG  tablet Take 1 tablet (100 mg total) by mouth at bedtime. (Patient not taking: Reported on 05/11/2020) 90 tablet 1   No facility-administered medications prior to visit.    Allergies  Allergen Reactions  . Ace Inhibitors Cough  . Latex Itching and Rash    ROS Review of Systems  Constitutional: Negative for chills and fever.  HENT: Negative for congestion, sinus pressure, sinus pain and sore throat.   Eyes: Negative for pain and discharge.  Respiratory: Negative for cough and shortness of breath.   Cardiovascular: Negative for chest pain and palpitations.  Gastrointestinal: Negative for abdominal pain, constipation, diarrhea, nausea and vomiting.  Endocrine: Negative for polydipsia and polyuria.  Genitourinary: Negative for dysuria and hematuria.  Musculoskeletal: Positive for arthralgias. Negative for neck pain and neck stiffness.  Skin: Negative for rash.  Neurological: Negative for dizziness and weakness.  Psychiatric/Behavioral: Negative for agitation and behavioral problems.      Objective:    Physical Exam Vitals reviewed.  Constitutional:      General: She is not in acute distress.    Appearance: She is not diaphoretic.  HENT:     Head: Normocephalic and atraumatic.     Nose: Nose normal. No congestion.     Mouth/Throat:     Mouth: Mucous membranes are moist.     Pharynx: No posterior oropharyngeal erythema.  Eyes:     General: No scleral icterus.    Extraocular Movements: Extraocular movements intact.     Pupils: Pupils are equal, round, and reactive to light.  Cardiovascular:     Rate and Rhythm: Normal rate and regular rhythm.     Pulses: Normal pulses.     Heart sounds: Normal heart sounds. No murmur heard.   Pulmonary:     Breath sounds: Normal breath sounds. No wheezing or rales.  Abdominal:     Palpations: Abdomen is soft.     Tenderness: There is  no abdominal tenderness.  Musculoskeletal:     Cervical back: Neck supple. No tenderness.     Right  lower leg: No edema.     Left lower leg: No edema.  Skin:    General: Skin is warm.     Findings: No rash.  Neurological:     General: No focal deficit present.     Mental Status: She is alert and oriented to person, place, and time.  Psychiatric:        Mood and Affect: Mood normal.        Behavior: Behavior normal.     BP (!) 149/78 (BP Location: Right Arm, Patient Position: Sitting, Cuff Size: Normal)   Pulse (!) 104   Resp 18   Ht 5' 2.5" (1.588 m)   Wt 197 lb 1.9 oz (89.4 kg)   SpO2 98%   BMI 35.48 kg/m  Wt Readings from Last 3 Encounters:  05/11/20 197 lb 1.9 oz (89.4 kg)  03/11/20 201 lb 6.4 oz (91.4 kg)  12/26/19 198 lb (89.8 kg)     There are no preventive care reminders to display for this patient.  There are no preventive care reminders to display for this patient.  Lab Results  Component Value Date   TSH 2.58 05/21/2019   Lab Results  Component Value Date   WBC 8.3 12/12/2019   HGB 13.3 12/12/2019   HCT 41.2 12/12/2019   MCV 84 12/12/2019   PLT 279 12/12/2019   Lab Results  Component Value Date   NA 137 12/12/2019   K 4.5 12/12/2019   CO2 28 12/12/2019   GLUCOSE 318 (H) 12/12/2019   BUN 12 12/12/2019   CREATININE 0.77 12/12/2019   BILITOT 0.4 12/12/2019   ALKPHOS 113 12/12/2019   AST 23 12/12/2019   ALT 21 12/12/2019   PROT 7.4 12/12/2019   ALBUMIN 4.5 12/12/2019   CALCIUM 9.8 12/12/2019   ANIONGAP 7 05/19/2015   Lab Results  Component Value Date   CHOL 215 (H) 12/12/2019   Lab Results  Component Value Date   HDL 54 12/12/2019   Lab Results  Component Value Date   LDLCALC 135 (H) 12/12/2019   Lab Results  Component Value Date   TRIG 145 12/12/2019   Lab Results  Component Value Date   CHOLHDL 4.0 12/12/2019   Lab Results  Component Value Date   HGBA1C 9.9 (A) 05/11/2020      Assessment & Plan:   Problem List Items Addressed This Visit      Cardiovascular and Mediastinum   Type 2 diabetes mellitus with vascular  disease ([place]) - Primary    HbA1C: 9.9 today in office On Metformin and Lantus 50 U qHS Refilled Glipizide Advised to follow diabetic diet On statin Advised to follow up with Dr Dorris Fetch      Relevant Medications   spironolactone (ALDACTONE) 25 MG tablet   glipiZIDE (GLUCOTROL XL) 10 MG 24 hr tablet   atorvastatin (LIPITOR) 20 MG tablet   Other Relevant Orders   POCT glycosylated hemoglobin (Hb A1C) (Completed)   Primary hypertension    BP Readings from Last 1 Encounters:  05/11/20 (!) 149/78   uncontrolled with Amlodipine Refilled Spironolactone DC Metoprolol and Hydralazine as patient's BP not very high even only with Amlodipine Counseled for compliance with the medications Advised DASH diet and moderate exercise/walking as tolerated      Relevant Medications   spironolactone (ALDACTONE) 25 MG tablet   atorvastatin (LIPITOR) 20 MG tablet  Digestive   Gastroesophageal reflux disease    On Pantoprazole, refills provided      Relevant Medications   pantoprazole (PROTONIX) 20 MG tablet     Other   Mixed hyperlipidemia    Ran out of Atorvastatin, refills provided      Relevant Medications   spironolactone (ALDACTONE) 25 MG tablet   atorvastatin (LIPITOR) 20 MG tablet   Anxiety    Mentions having episodes of anxiety at times Sleep improved, was on Trazodone in the past Started Lexapro 10 mg QD      Relevant Medications   escitalopram (LEXAPRO) 10 MG tablet    Other Visit Diagnoses    Cervical disc disorder with radiculopathy of cervical region       Relevant Medications   gabapentin (NEURONTIN) 300 MG capsule   escitalopram (LEXAPRO) 10 MG tablet   Uncontrolled type 2 diabetes mellitus with complication ([facility])       Relevant Medications   glipiZIDE (GLUCOTROL XL) 10 MG 24 hr tablet   atorvastatin (LIPITOR) 20 MG tablet      Meds ordered this encounter  Medications  . spironolactone (ALDACTONE) 25 MG tablet    Sig: Take 1 tablet (25 mg total) by mouth  daily.    Dispense:  90 tablet    Refill:  1  . glipiZIDE (GLUCOTROL XL) 10 MG 24 hr tablet    Sig: Take 1 tablet (10 mg total) by mouth daily with breakfast. TAKE 2 TABLETS EVERY DAY WITH BREAKFAST    Dispense:  180 tablet    Refill:  1  . gabapentin (NEURONTIN) 300 MG capsule    Sig: Take 1 capsule (300 mg total) by mouth at bedtime.    Dispense:  90 capsule    Refill:  1  . atorvastatin (LIPITOR) 20 MG tablet    Sig: Take 1 tablet (20 mg total) by mouth daily.    Dispense:  90 tablet    Refill:  3  . pantoprazole (PROTONIX) 20 MG tablet    Sig: Take 1 tablet (20 mg total) by mouth daily.    Dispense:  90 tablet    Refill:  1  . escitalopram (LEXAPRO) 10 MG tablet    Sig: Take 1 tablet (10 mg total) by mouth daily.    Dispense:  30 tablet    Refill:  2    Follow-up: Return in about 3 months (around 08/11/2020) for HbA1C check and medication review.    Lindell Spar, MD

## 2020-05-13 NOTE — Telephone Encounter (Signed)
cancel

## 2020-08-13 ENCOUNTER — Ambulatory Visit (INDEPENDENT_AMBULATORY_CARE_PROVIDER_SITE_OTHER): Payer: Medicare HMO | Admitting: Family Medicine

## 2020-08-13 ENCOUNTER — Other Ambulatory Visit: Payer: Self-pay

## 2020-08-13 ENCOUNTER — Other Ambulatory Visit (HOSPITAL_COMMUNITY): Payer: Self-pay | Admitting: Family Medicine

## 2020-08-13 ENCOUNTER — Encounter: Payer: Self-pay | Admitting: Family Medicine

## 2020-08-13 VITALS — BP 134/91 | HR 94 | Temp 100.2°F | Resp 18 | Ht 62.5 in | Wt 193.0 lb

## 2020-08-13 DIAGNOSIS — E559 Vitamin D deficiency, unspecified: Secondary | ICD-10-CM

## 2020-08-13 DIAGNOSIS — I10 Essential (primary) hypertension: Secondary | ICD-10-CM | POA: Diagnosis not present

## 2020-08-13 DIAGNOSIS — Z1152 Encounter for screening for COVID-19: Secondary | ICD-10-CM

## 2020-08-13 DIAGNOSIS — E1159 Type 2 diabetes mellitus with other circulatory complications: Secondary | ICD-10-CM | POA: Diagnosis not present

## 2020-08-13 DIAGNOSIS — E782 Mixed hyperlipidemia: Secondary | ICD-10-CM

## 2020-08-13 DIAGNOSIS — Z1231 Encounter for screening mammogram for malignant neoplasm of breast: Secondary | ICD-10-CM

## 2020-08-13 LAB — POCT GLYCOSYLATED HEMOGLOBIN (HGB A1C): HbA1c, POC (controlled diabetic range): 13.2 % — AB (ref 0.0–7.0)

## 2020-08-13 NOTE — Progress Notes (Signed)
Tiffany Jacobs     MRN: [id number]      DOB: Jul 19, 1945   HPI Tiffany Jacobs is here for follow up and re-evaluation of chronic medical conditions, medication management and review of any available recent lab and radiology data.  Preventive health is updated, specifically  Cancer screening and Immunization.   Questions or concerns regarding consultations or procedures which the PT has had in the interim are  addressed. The PT denies any adverse reactions to current medications since the last visit.  Not testing blood sugar, not following a diabetic diet, c/o fatigue, polyuria and polydipsia   ROS Denies recent fever or chills. Denies sinus pressure, nasal congestion, ear pain or sore throat. Denies chest congestion, productive cough or wheezing. Denies chest pains, palpitations and leg swelling Denies abdominal pain, nausea, vomiting,diarrhea or constipation.   Denies dysuria, frequency, hesitancy or incontinence. Denies joint pain, swelling and limitation in mobility. Denies headaches, seizures, numbness, or tingling. Denies depression, anxiety or insomnia. Denies skin break down or rash.   PE  BP (!) 134/91 (BP Location: Right Arm, Patient Position: Sitting, Cuff Size: Normal)   Pulse 94   Temp 100.2 F (37.9 C) (Oral)   Resp 18   Ht 5' 2.5" (1.588 m)   Wt 193 lb (87.5 kg)   SpO2 93%   BMI 34.74 kg/m   Patient alert and oriented and in no cardiopulmonary distress.  HEENT: No facial asymmetry, EOMI,     Neck supple .  Chest: Clear to auscultation bilaterally.  CVS: S1, S2 no murmurs, no S3.Regular rate.  ABD: Soft non tender.   Ext: No edema  MS: Adequate ROM spine, shoulders, hips and knees.  Skin: Intact, no ulcerations or rash noted.  Psych: Good eye contact, normal affect. Memory intact not anxious or depressed appearing.  CNS: CN 2-12 intact, power,  normal throughout.no focal deficits noted.   Assessment & Plan  Mixed  hyperlipidemia Hyperlipidemia:Low fat diet discussed and encouraged.   Lipid Panel  Lab Results  Component Value Date   CHOL 172 08/13/2020   HDL 51 08/13/2020   LDLCALC 95 08/13/2020   TRIG 151 (H) 08/13/2020   CHOLHDL 3.4 08/13/2020     Controlled, no change in medication   Type 2 diabetes mellitus with vascular disease ([place]) Uncontrolled and deteriorated, needs to become compliant and manage the diabetes with Endo, referral entered Tiffany Jacobs is reminded of the importance of commitment to daily physical activity for 30 minutes or more, as able and the need to limit carbohydrate intake to 30 to 60 grams per meal to help with blood sugar control.   The need to take medication as prescribed, test blood sugar as directed, and to call between visits if there is a concern that blood sugar is uncontrolled is also discussed.   Tiffany Jacobs is reminded of the importance of daily foot exam, annual eye examination, and good blood sugar, blood pressure and cholesterol control.  Diabetic Labs Latest Ref Rng & Units 08/13/2020 05/11/2020 12/12/2019 10/03/2019 10/03/2019  HbA1c 0.0 - 7.0 % 13.2(A) 9.9(A) - 11.6(A) 11.6(A)  Microalbumin mg/dL - - - - -  Micro/Creat Ratio <30 mcg/mg creat - - - - -  Chol 100 - 199 mg/dL 172 - 215(H) - -  HDL >39 mg/dL 51 - 54 - -  Calc LDL 0 - 99 mg/dL 95 - 135(H) - -  Triglycerides 0 - 149 mg/dL 151(H) - 145 - -  Creatinine 0.57 - 1.00 mg/dL 0.95 -  0.77 - -   BP/Weight 08/13/2020 05/11/2020 03/11/2020 12/26/2019 12/18/2019 10/03/2019 3/66/2947  Systolic BP 654 650 354 656 812 751 700  Diastolic BP 91 78 76 84 84 70 69  Wt. (Lbs) 193 197.12 201.4 198 198 191 195  BMI 34.74 35.48 35.68 35.64 35.64 34.93 35.67   Foot/eye exam completion dates Latest Ref Rng & Units 12/04/2018 02/14/2018  Eye Exam No Retinopathy - -  Foot exam Order - - -  Foot Form Completion - Done Done        Morbid obesity ([place])  Patient re-educated about  the importance of commitment  to a  minimum of 150 minutes of exercise per week as able.  The importance of healthy food choices with portion control discussed, as well as eating regularly and within a 12 hour window most days. The need to choose "clean , green" food 50 to 75% of the time is discussed, as well as to make water the primary drink and set a goal of 64 ounces water daily.    Weight /BMI 08/13/2020 05/11/2020 03/11/2020  WEIGHT 193 lb 197 lb 1.9 oz 201 lb 6.4 oz  HEIGHT 5' 2.5" 5' 2.5" 5\' 3"   BMI 34.74 kg/m2 35.48 kg/m2 35.68 kg/m2      Primary hypertension Un Controlled, no change in medication DASH diet and commitment to daily physical activity for a minimum of 30 minutes discussed and encouraged, as a part of hypertension management. The importance of attaining a healthy weight is also discussed.  BP/Weight 08/13/2020 05/11/2020 03/11/2020 12/26/2019 12/18/2019 10/03/2019 1/74/9449  Systolic BP 675 916 384 665 993 570 177  Diastolic BP 91 78 76 84 84 70 69  Wt. (Lbs) 193 197.12 201.4 198 198 191 195  BMI 34.74 35.48 35.68 35.64 35.64 34.93 35.67       Encounter for screening for COVID-19 Low grade temp with cough and fatigue, has not had covid vaccine, covid screen test is negative

## 2020-08-13 NOTE — Patient Instructions (Addendum)
Labs today, lipid, cmp and eGFR, TSH, CBC, vit D and microalb'  Annual physical exam 10/14 or after  Please get tested for covid at [facility] pharmacy, sign up for test which is free   You are referred to Dr Dorris Fetch as soon as possible  Please re consider there covid vaccine, you need this  Please schedule mammogram July when due at checkout  vERY im portant ythat you get your blood sugar controlled to improve your health  It is important that you exercise regularly at least 30 minutes 5 times a week. If you develop chest pain, have severe difficulty breathing, or feel very tired, stop exercising immediately and seek medical attention   Think about what you will eat, plan ahead. Choose " clean, green, fresh or frozen" over canned, processed or packaged foods which are more sugary, salty and fatty. 70 to 75% of food eaten should be vegetables and fruit. Three meals at set times with snacks allowed between meals, but they must be fruit or vegetables. Aim to eat over a 12 hour period , example 7 am to 7 pm, and STOP after  your last meal of the day. Drink water,generally about 64 ounces per day, no other drink is as healthy. Fruit juice is best enjoyed in a healthy way, by EATING the fruit.  Thanks for choosing [facility], we consider it a privelige to serve you.

## 2020-08-14 ENCOUNTER — Telehealth: Payer: Self-pay

## 2020-08-14 ENCOUNTER — Encounter: Payer: Self-pay | Admitting: Family Medicine

## 2020-08-14 DIAGNOSIS — Z1152 Encounter for screening for COVID-19: Secondary | ICD-10-CM | POA: Insufficient documentation

## 2020-08-14 DIAGNOSIS — E1159 Type 2 diabetes mellitus with other circulatory complications: Secondary | ICD-10-CM | POA: Diagnosis not present

## 2020-08-14 LAB — LIPID PANEL
Chol/HDL Ratio: 3.4 ratio (ref 0.0–4.4)
Cholesterol, Total: 172 mg/dL (ref 100–199)
HDL: 51 mg/dL (ref >39)
LDL Chol Calc (NIH): 95 mg/dL (ref 0–99)
Triglycerides: 151 mg/dL — ABNORMAL HIGH (ref 0–149)
VLDL Cholesterol Cal: 26 mg/dL (ref 5–40)

## 2020-08-14 LAB — TSH: TSH: 1.73 u[IU]/mL (ref 0.450–4.500)

## 2020-08-14 LAB — CBC
Hematocrit: 40.3 % (ref 34.0–46.6)
Hemoglobin: 13.2 g/dL (ref 11.1–15.9)
MCH: 27.6 pg (ref 26.6–33.0)
MCHC: 32.8 g/dL (ref 31.5–35.7)
MCV: 84 fL (ref 79–97)
Platelets: 269 10*3/uL (ref 150–450)
RBC: 4.78 x10E6/uL (ref 3.77–5.28)
RDW: 13 % (ref 11.7–15.4)
WBC: 7.7 10*3/uL (ref 3.4–10.8)

## 2020-08-14 LAB — VITAMIN D 25 HYDROXY (VIT D DEFICIENCY, FRACTURES): Vit D, 25-Hydroxy: 34.1 ng/mL (ref 30.0–100.0)

## 2020-08-14 LAB — CMP14+EGFR
ALT: 25 IU/L (ref 0–32)
AST: 28 IU/L (ref 0–40)
Albumin/Globulin Ratio: 1.4 (ref 1.2–2.2)
Albumin: 4.1 g/dL (ref 3.7–4.7)
Alkaline Phosphatase: 127 IU/L — ABNORMAL HIGH (ref 44–121)
BUN/Creatinine Ratio: 11 — ABNORMAL LOW (ref 12–28)
BUN: 10 mg/dL (ref 8–27)
Bilirubin Total: 0.3 mg/dL (ref 0.0–1.2)
CO2: 24 mmol/L (ref 20–29)
Calcium: 9.4 mg/dL (ref 8.7–10.3)
Chloride: 99 mmol/L (ref 96–106)
Creatinine, Ser: 0.95 mg/dL (ref 0.57–1.00)
Globulin, Total: 3 g/dL (ref 1.5–4.5)
Glucose: 318 mg/dL — ABNORMAL HIGH (ref 65–99)
Potassium: 4.3 mmol/L (ref 3.5–5.2)
Sodium: 139 mmol/L (ref 134–144)
Total Protein: 7.1 g/dL (ref 6.0–8.5)
eGFR: 62 mL/min/{1.73_m2} (ref >59)

## 2020-08-14 NOTE — Assessment & Plan Note (Signed)
Uncontrolled and deteriorated, needs to become compliant and manage the diabetes with Endo, referral entered Tiffany Jacobs is reminded of the importance of commitment to daily physical activity for 30 minutes or more, as able and the need to limit carbohydrate intake to 30 to 60 grams per meal to help with blood sugar control.   The need to take medication as prescribed, test blood sugar as directed, and to call between visits if there is a concern that blood sugar is uncontrolled is also discussed.   Tiffany Jacobs is reminded of the importance of daily foot exam, annual eye examination, and good blood sugar, blood pressure and cholesterol control.  Diabetic Labs Latest Ref Rng & Units 08/13/2020 05/11/2020 12/12/2019 10/03/2019 10/03/2019  HbA1c 0.0 - 7.0 % 13.2(A) 9.9(A) - 11.6(A) 11.6(A)  Microalbumin mg/dL - - - - -  Micro/Creat Ratio <30 mcg/mg creat - - - - -  Chol 100 - 199 mg/dL 172 - 215(H) - -  HDL >39 mg/dL 51 - 54 - -  Calc LDL 0 - 99 mg/dL 95 - 135(H) - -  Triglycerides 0 - 149 mg/dL 151(H) - 145 - -  Creatinine 0.57 - 1.00 mg/dL 0.95 - 0.77 - -   BP/Weight 08/13/2020 05/11/2020 03/11/2020 12/26/2019 12/18/2019 10/03/2019 5/40/0867  Systolic BP 619 509 326 712 458 099 833  Diastolic BP 91 78 76 84 84 70 69  Wt. (Lbs) 193 197.12 201.4 198 198 191 195  BMI 34.74 35.48 35.68 35.64 35.64 34.93 35.67   Foot/eye exam completion dates Latest Ref Rng & Units 12/04/2018 02/14/2018  Eye Exam No Retinopathy - -  Foot exam Order - - -  Foot Form Completion - Done Done

## 2020-08-14 NOTE — Assessment & Plan Note (Signed)
Low grade temp with cough and fatigue, has not had covid vaccine, covid screen test is negative

## 2020-08-14 NOTE — Assessment & Plan Note (Signed)
  Patient re-educated about  the importance of commitment to a  minimum of 150 minutes of exercise per week as able.  The importance of healthy food choices with portion control discussed, as well as eating regularly and within a 12 hour window most days. The need to choose "clean , green" food 50 to 75% of the time is discussed, as well as to make water the primary drink and set a goal of 64 ounces water daily.    Weight /BMI 08/13/2020 05/11/2020 03/11/2020  WEIGHT 193 lb 197 lb 1.9 oz 201 lb 6.4 oz  HEIGHT 5' 2.5" 5' 2.5" 5\' 3"   BMI 34.74 kg/m2 35.48 kg/m2 35.68 kg/m2

## 2020-08-14 NOTE — Assessment & Plan Note (Signed)
Un Controlled, no change in medication DASH diet and commitment to daily physical activity for a minimum of 30 minutes discussed and encouraged, as a part of hypertension management. The importance of attaining a healthy weight is also discussed.  BP/Weight 08/13/2020 05/11/2020 03/11/2020 12/26/2019 12/18/2019 10/03/2019 5/32/9924  Systolic BP 268 341 962 229 798 921 194  Diastolic BP 91 78 76 84 84 70 69  Wt. (Lbs) 193 197.12 201.4 198 198 191 195  BMI 34.74 35.48 35.68 35.64 35.64 34.93 35.67

## 2020-08-14 NOTE — Telephone Encounter (Signed)
Called pt-no answer no VM. She needs to sch fu with Dr Dorris Fetch with meter and logs

## 2020-08-14 NOTE — Assessment & Plan Note (Signed)
Hyperlipidemia:Low fat diet discussed and encouraged.   Lipid Panel  Lab Results  Component Value Date   CHOL 172 08/13/2020   HDL 51 08/13/2020   LDLCALC 95 08/13/2020   TRIG 151 (H) 08/13/2020   CHOLHDL 3.4 08/13/2020     Controlled, no change in medication

## 2020-08-15 LAB — NOVEL CORONAVIRUS, NAA: SARS-CoV-2, NAA: NOT DETECTED

## 2020-08-15 LAB — SARS-COV-2, NAA 2 DAY TAT

## 2020-08-16 LAB — MICROALBUMIN, URINE: Microalbumin, Urine: 9.7 ug/mL

## 2020-10-01 ENCOUNTER — Other Ambulatory Visit: Payer: Self-pay

## 2020-10-01 ENCOUNTER — Ambulatory Visit (HOSPITAL_COMMUNITY)
Admission: RE | Admit: 2020-10-01 | Discharge: 2020-10-01 | Disposition: A | Discharge status: Home / Self Care | Payer: Medicare HMO | Source: Ambulatory Visit | Attending: Family Medicine | Admitting: Family Medicine

## 2020-10-01 DIAGNOSIS — Z1231 Encounter for screening mammogram for malignant neoplasm of breast: Secondary | ICD-10-CM | POA: Insufficient documentation

## 2020-11-10 ENCOUNTER — Other Ambulatory Visit: Payer: Self-pay | Admitting: Internal Medicine

## 2020-11-10 DIAGNOSIS — E1165 Type 2 diabetes mellitus with hyperglycemia: Secondary | ICD-10-CM

## 2020-11-10 DIAGNOSIS — IMO0002 Reserved for concepts with insufficient information to code with codable children: Secondary | ICD-10-CM

## 2020-11-19 ENCOUNTER — Other Ambulatory Visit: Payer: Self-pay

## 2020-11-19 ENCOUNTER — Telehealth: Payer: Self-pay

## 2020-11-19 DIAGNOSIS — F419 Anxiety disorder, unspecified: Secondary | ICD-10-CM

## 2020-11-19 MED ORDER — ESCITALOPRAM OXALATE 10 MG PO TABS: 10.0000 mg | ORAL_TABLET | Freq: Every day | ORAL | 1 refills | Status: DC

## 2020-11-19 MED ORDER — INSULIN SYRINGES (DISPOSABLE) U-100 1 ML MISC: 5 refills | Status: DC

## 2020-11-19 NOTE — Telephone Encounter (Signed)
Sent to [facility] 

## 2020-11-19 NOTE — Telephone Encounter (Signed)
Patient called need med refill  escitalopram (LEXAPRO) 10 MG tablet   Needs the long syringes and not the [facility] (Now [facility]) - [place], [address]  [place], [place] [address]  Phone:  [phone]  Fax:  [phone]

## 2020-12-03 ENCOUNTER — Telehealth: Payer: Self-pay | Admitting: Family Medicine

## 2020-12-03 NOTE — Telephone Encounter (Signed)
Please send in Insulin pens to the patient,   Also asking about medication -escitalopram 10mg  --why is she taking this medication  She said is causes early death

## 2020-12-08 NOTE — Telephone Encounter (Signed)
Insulin needles were sent to [facility]. Waiting to hear if that was what she was requesting.

## 2020-12-08 NOTE — Telephone Encounter (Signed)
Called pt no answer x 2

## 2020-12-14 NOTE — Telephone Encounter (Signed)
Unable to reach patient.

## 2020-12-22 ENCOUNTER — Other Ambulatory Visit: Payer: Self-pay

## 2020-12-22 ENCOUNTER — Ambulatory Visit (INDEPENDENT_AMBULATORY_CARE_PROVIDER_SITE_OTHER): Payer: Medicare HMO | Admitting: Family Medicine

## 2020-12-22 ENCOUNTER — Encounter: Payer: Self-pay | Admitting: Family Medicine

## 2020-12-22 VITALS — BP 167/90 | HR 85 | Resp 18 | Ht 62.5 in | Wt 192.0 lb

## 2020-12-22 DIAGNOSIS — E1165 Type 2 diabetes mellitus with hyperglycemia: Secondary | ICD-10-CM | POA: Diagnosis not present

## 2020-12-22 DIAGNOSIS — I10 Essential (primary) hypertension: Secondary | ICD-10-CM

## 2020-12-22 DIAGNOSIS — E1159 Type 2 diabetes mellitus with other circulatory complications: Secondary | ICD-10-CM

## 2020-12-22 DIAGNOSIS — Z794 Long term (current) use of insulin: Secondary | ICD-10-CM

## 2020-12-22 DIAGNOSIS — Z0000 Encounter for general adult medical examination without abnormal findings: Secondary | ICD-10-CM

## 2020-12-22 DIAGNOSIS — Z23 Encounter for immunization: Secondary | ICD-10-CM

## 2020-12-22 MED ORDER — METOPROLOL TARTRATE 25 MG PO TABS: 25.0000 mg | ORAL_TABLET | Freq: Two times a day (BID) | ORAL | 1 refills | Status: DC

## 2020-12-22 MED ORDER — SPIRONOLACTONE 25 MG PO TABS: 25.0000 mg | ORAL_TABLET | Freq: Every day | ORAL | 3 refills | Status: DC

## 2020-12-22 MED ORDER — MONTELUKAST SODIUM 10 MG PO TABS: 10.0000 mg | ORAL_TABLET | Freq: Every day | ORAL | 1 refills | Status: DC

## 2020-12-22 MED ORDER — HYDRALAZINE HCL 25 MG PO TABS: 25.0000 mg | ORAL_TABLET | Freq: Three times a day (TID) | ORAL | 1 refills | Status: DC

## 2020-12-22 NOTE — Assessment & Plan Note (Signed)
UNCONTROLLED RESUME HYDRALAZINE , METOPROLOL AND SPIRONOLACTONE AND RE EVAL Tiffany Jacobs is reminded of the importance of commitment to daily physical activity for 30 minutes or more, as able and the need to limit carbohydrate intake to 30 to 60 grams per meal to help with blood sugar control.   The need to take medication as prescribed, test blood sugar as directed, and to call between visits if there is a concern that blood sugar is uncontrolled is also discussed.   Tiffany Jacobs is reminded of the importance of daily foot exam, annual eye examination, and good blood sugar, blood pressure and cholesterol control.  Diabetic Labs Latest Ref Rng & Units 08/13/2020 05/11/2020 12/12/2019 10/03/2019 10/03/2019  HbA1c 0.0 - 7.0 % 13.2(A) 9.9(A) - 11.6(A) 11.6(A)  Microalbumin mg/dL - - - - -  Micro/Creat Ratio <30 mcg/mg creat - - - - -  Chol 100 - 199 mg/dL 172 - 215(H) - -  HDL >39 mg/dL 51 - 54 - -  Calc LDL 0 - 99 mg/dL 95 - 135(H) - -  Triglycerides 0 - 149 mg/dL 151(H) - 145 - -  Creatinine 0.57 - 1.00 mg/dL 0.95 - 0.77 - -   BP/Weight 12/22/2020 08/13/2020 05/11/2020 03/11/2020 12/26/2019 12/18/2019 06/16/8784  Systolic BP 767 209 470 962 836 629 476  Diastolic BP 90 91 78 76 84 84 70  Wt. (Lbs) 192.04 193 197.12 201.4 198 198 191  BMI 34.56 34.74 35.48 35.68 35.64 35.64 34.93   Foot/eye exam completion dates Latest Ref Rng & Units 12/04/2018 02/14/2018  Eye Exam No Retinopathy - -  Foot exam Order - - -  Foot Form Completion - Done Done

## 2020-12-22 NOTE — Patient Instructions (Addendum)
F/U in 6 to 8 weeks, call if you need me before, re eval uncontrolled blood pressure  Please bring all meds to visit  Flu vaccine today  HBA1, chem 7 and eGFR today  Please schedule appt with Endo at checkout if possible, uncontrolled diabetes  New additional;l BP meds are what you have been on in the past, metoprolol, hydralazine and spironolactone is refilled  It is important that you exercise regularly at least 30 minutes 5 times a week. If you develop chest pain, have severe difficulty breathing, or feel very tired, stop exercising immediately and seek medical attention  .Think about what you will eat, plan ahead. Choose " clean, green, fresh or frozen" over canned, processed or packaged foods which are more sugary, salty and fatty. 70 to 75% of food eaten should be vegetables and fruit. Three meals at set times with snacks allowed between meals, but they must be fruit or vegetables. Aim to eat over a 12 hour period , example 7 am to 7 pm, and STOP after  your last meal of the day. Drink water,generally about 64 ounces per day, no other drink is as healthy. Fruit juice is best enjoyed in a healthy way, by EATING the fruit.  Thanks for choosing [facility], we consider it a privelige to serve you.

## 2020-12-22 NOTE — Assessment & Plan Note (Signed)
Uncontrolled , lab today and endo appt asap Tiffany Jacobs is reminded of the importance of commitment to daily physical activity for 30 minutes or more, as able and the need to limit carbohydrate intake to 30 to 60 grams per meal to help with blood sugar control.   The need to take medication as prescribed, test blood sugar as directed, and to call between visits if there is a concern that blood sugar is uncontrolled is also discussed.   Tiffany Jacobs is reminded of the importance of daily foot exam, annual eye examination, and good blood sugar, blood pressure and cholesterol control.  Diabetic Labs Latest Ref Rng & Units 08/13/2020 05/11/2020 12/12/2019 10/03/2019 10/03/2019  HbA1c 0.0 - 7.0 % 13.2(A) 9.9(A) - 11.6(A) 11.6(A)  Microalbumin mg/dL - - - - -  Micro/Creat Ratio <30 mcg/mg creat - - - - -  Chol 100 - 199 mg/dL 172 - 215(H) - -  HDL >39 mg/dL 51 - 54 - -  Calc LDL 0 - 99 mg/dL 95 - 135(H) - -  Triglycerides 0 - 149 mg/dL 151(H) - 145 - -  Creatinine 0.57 - 1.00 mg/dL 0.95 - 0.77 - -   BP/Weight 12/22/2020 08/13/2020 05/11/2020 03/11/2020 12/26/2019 12/18/2019 9/40/7680  Systolic BP 881 103 159 458 592 924 462  Diastolic BP 90 91 78 76 84 84 70  Wt. (Lbs) 192.04 193 197.12 201.4 198 198 191  BMI 34.56 34.74 35.48 35.68 35.64 35.64 34.93   Foot/eye exam completion dates Latest Ref Rng & Units 12/04/2018 02/14/2018  Eye Exam No Retinopathy - -  Foot exam Order - - -  Foot Form Completion - Done Done

## 2020-12-22 NOTE — Assessment & Plan Note (Signed)
  Patient re-educated about  the importance of commitment to a  minimum of 150 minutes of exercise per week as able.  The importance of healthy food choices with portion control discussed, as well as eating regularly and within a 12 hour window most days. The need to choose "clean , green" food 50 to 75% of the time is discussed, as well as to make water the primary drink and set a goal of 64 ounces water daily.    Weight /BMI 12/22/2020 08/13/2020 05/11/2020  WEIGHT 192 lb 0.6 oz 193 lb 197 lb 1.9 oz  HEIGHT 5' 2.5" 5' 2.5" 5' 2.5"  BMI 34.56 kg/m2 34.74 kg/m2 35.48 kg/m2

## 2020-12-22 NOTE — Assessment & Plan Note (Signed)

## 2020-12-22 NOTE — Progress Notes (Signed)
Tiffany Jacobs     MRN: [id number]      DOB: [birth year]  HPI: Patient is in for annual physical exam. Referred urgently back to endo for uncontrolled diabetes.Reports FBG between 200 to 300 for weeks Uncontrolled HTN is addressed Recent labs,  are reviewed. Immunization is reviewed , and  updated .   PE: BP (!) 167/90   Pulse 85   Resp 18   Ht 5' 2.5" (1.588 m)   Wt 192 lb 0.6 oz (87.1 kg)   SpO2 93%   BMI 34.56 kg/m   Pleasant  female, alert and oriented x 3, in no cardio-pulmonary distress. Afebrile. HEENT No facial trauma or asymetry. Sinuses non tender.  Extra occullar muscles intact.. External ears normal, . Neck: supple, no adenopathy,JVD or thyromegaly.No bruits.  Chest: Clear to ascultation bilaterally.No crackles or wheezes. Non tender to palpation    Cardiovascular system; Heart sounds normal,  S1 and  S2 ,no S3.  No murmur, or thrill. Apical beat not displaced Peripheral pulses normal.  Abdomen: Soft, non tender, No guarding, tenderness or rebound.     Musculoskeletal exam: Decreased though adequate  ROM of spine, hips , shoulders and knees. No deformity ,swelling or crepitus noted. No muscle wasting or atrophy.   Neurologic: Cranial nerves 2 to 12 intact. Power, tone ,sensation and reflexes normal throughout. disturbance in gait. No tremor.  Skin: Intact, no ulceration, erythema , scaling or rash noted. Pigmentation normal throughout  Psych; Normal mood and affect. Judgement and concentration normal   Assessment & Plan:  Annual physical exam Annual exam as documented. Counseling done  re healthy lifestyle involving commitment to 150 minutes exercise per week, heart healthy diet, and attaining healthy weight.The importance of adequate sleep also discussed. Regular seat belt use and home safety, is also discussed. Changes in health habits are decided on by the patient with goals and time frames  set for achieving  them. Immunization and cancer screening needs are specifically addressed at this visit.   Type 2 diabetes mellitus with vascular disease ([place]) Uncontrolled , lab today and endo appt asap [name] is reminded of the importance of commitment to daily physical activity for 30 minutes or more, as able and the need to limit carbohydrate intake to 30 to 60 grams per meal to help with blood sugar control.   The need to take medication as prescribed, test blood sugar as directed, and to call between visits if there is a concern that blood sugar is uncontrolled is also discussed.   [name] is reminded of the importance of daily foot exam, annual eye examination, and good blood sugar, blood pressure and cholesterol control.  Diabetic Labs Latest Ref Rng & Units 08/13/2020 05/11/2020 12/12/2019 10/03/2019 10/03/2019  HbA1c 0.0 - 7.0 % 13.2(A) 9.9(A) - 11.6(A) 11.6(A)  Microalbumin mg/dL - - - - -  Micro/Creat Ratio <30 mcg/mg creat - - - - -  Chol 100 - 199 mg/dL 172 - 215(H) - -  HDL >39 mg/dL 51 - 54 - -  Calc LDL 0 - 99 mg/dL 95 - 135(H) - -  Triglycerides 0 - 149 mg/dL 151(H) - 145 - -  Creatinine 0.57 - 1.00 mg/dL 0.95 - 0.77 - -   BP/Weight 12/22/2020 08/13/2020 05/11/2020 03/11/2020 12/26/2019 12/18/2019 4/81/8563  Systolic BP 149 702 637 858 850 277 412  Diastolic BP 90 91 78 76 84 84 70  Wt. (Lbs) 192.04 193 197.12 201.4 198 198 191  BMI 34.56 34.74  35.48 35.68 35.64 35.64 34.93   Foot/eye exam completion dates Latest Ref Rng & Units 12/04/2018 02/14/2018  Eye Exam No Retinopathy - -  Foot exam Order - - -  Foot Form Completion - Done Done        Morbid obesity ([place])  Patient re-educated about  the importance of commitment to a  minimum of 150 minutes of exercise per week as able.  The importance of healthy food choices with portion control discussed, as well as eating regularly and within a 12 hour window most days. The need to choose "clean , green" food 50 to 75% of the time  is discussed, as well as to make water the primary drink and set a goal of 64 ounces water daily.    Weight /BMI 12/22/2020 08/13/2020 05/11/2020  WEIGHT 192 lb 0.6 oz 193 lb 197 lb 1.9 oz  HEIGHT 5' 2.5" 5' 2.5" 5' 2.5"  BMI 34.56 kg/m2 34.74 kg/m2 35.48 kg/m2      Primary hypertension UNCONTROLLED RESUME HYDRALAZINE , METOPROLOL AND SPIRONOLACTONE AND RE EVAL [name] is reminded of the importance of commitment to daily physical activity for 30 minutes or more, as able and the need to limit carbohydrate intake to 30 to 60 grams per meal to help with blood sugar control.   The need to take medication as prescribed, test blood sugar as directed, and to call between visits if there is a concern that blood sugar is uncontrolled is also discussed.   [name] is reminded of the importance of daily foot exam, annual eye examination, and good blood sugar, blood pressure and cholesterol control.  Diabetic Labs Latest Ref Rng & Units 08/13/2020 05/11/2020 12/12/2019 10/03/2019 10/03/2019  HbA1c 0.0 - 7.0 % 13.2(A) 9.9(A) - 11.6(A) 11.6(A)  Microalbumin mg/dL - - - - -  Micro/Creat Ratio <30 mcg/mg creat - - - - -  Chol 100 - 199 mg/dL 172 - 215(H) - -  HDL >39 mg/dL 51 - 54 - -  Calc LDL 0 - 99 mg/dL 95 - 135(H) - -  Triglycerides 0 - 149 mg/dL 151(H) - 145 - -  Creatinine 0.57 - 1.00 mg/dL 0.95 - 0.77 - -   BP/Weight 12/22/2020 08/13/2020 05/11/2020 03/11/2020 12/26/2019 12/18/2019 11/01/32  Systolic BP 917 915 056 979 480 165 537  Diastolic BP 90 91 78 76 84 84 70  Wt. (Lbs) 192.04 193 197.12 201.4 198 198 191  BMI 34.56 34.74 35.48 35.68 35.64 35.64 34.93   Foot/eye exam completion dates Latest Ref Rng & Units 12/04/2018 02/14/2018  Eye Exam No Retinopathy - -  Foot exam Order - - -  Foot Form Completion - Done Done

## 2020-12-23 ENCOUNTER — Telehealth: Payer: Self-pay

## 2020-12-23 NOTE — Telephone Encounter (Signed)
Tiffany Jacobs from [place], Dr Valeria Batman office called we sent over requesting medical records for eye exams, patient has not been seen in their office.

## 2020-12-24 LAB — BMP8+EGFR
BUN/Creatinine Ratio: 14 (ref 12–28)
BUN: 11 mg/dL (ref 8–27)
CO2: 27 mmol/L (ref 20–29)
Calcium: 10.2 mg/dL (ref 8.7–10.3)
Chloride: 94 mmol/L — ABNORMAL LOW (ref 96–106)
Creatinine, Ser: 0.8 mg/dL (ref 0.57–1.00)
Glucose: 370 mg/dL — ABNORMAL HIGH (ref 70–99)
Potassium: 4.4 mmol/L (ref 3.5–5.2)
Sodium: 136 mmol/L (ref 134–144)
eGFR: 77 mL/min/{1.73_m2} (ref >59)

## 2020-12-24 LAB — HEMOGLOBIN A1C
Est. average glucose Bld gHb Est-mCnc: 372 mg/dL
Hgb A1c MFr Bld: 14.6 % — ABNORMAL HIGH (ref 4.8–5.6)

## 2020-12-28 ENCOUNTER — Ambulatory Visit: Payer: Medicare HMO | Admitting: Nurse Practitioner

## 2021-01-07 ENCOUNTER — Other Ambulatory Visit: Payer: Self-pay

## 2021-01-07 ENCOUNTER — Ambulatory Visit (INDEPENDENT_AMBULATORY_CARE_PROVIDER_SITE_OTHER): Payer: Medicare HMO | Admitting: *Deleted

## 2021-01-07 DIAGNOSIS — Z0000 Encounter for general adult medical examination without abnormal findings: Secondary | ICD-10-CM

## 2021-01-07 NOTE — Patient Instructions (Signed)
Tiffany Jacobs , Thank you for taking time to come for your Medicare Wellness Visit. I appreciate your ongoing commitment to your health goals. Please review the following plan we discussed and let me know if I can assist you in the future.   Screening recommendations/referrals: Colonoscopy: Completed 10/10/2016 Mammogram: No longer required Bone Density: Completed 01/24/2013 Recommended yearly ophthalmology/optometry visit for glaucoma screening and checkup Recommended yearly dental visit for hygiene and checkup  Vaccinations: Influenza vaccine: Completed 12/22/2020 Pneumococcal vaccine: Up to date Tdap vaccine: Due now Shingles vaccine: Patient declined    Advanced directives: Information discussed, Advanced Directive packet was mailed for patient to review.  Conditions/risks identified: Diabetes, Hypertension  Next appointment: 1 year   Preventive Care 24 Years and Older, Female Preventive care refers to lifestyle choices and visits with your health care provider that can promote health and wellness. What does preventive care include? A yearly physical exam. This is also called an annual well check. Dental exams once or twice a year. Routine eye exams. Ask your health care provider how often you should have your eyes checked. Personal lifestyle choices, including: Daily care of your teeth and gums. Regular physical activity. Eating a healthy diet. Avoiding tobacco and drug use. Limiting alcohol use. Practicing safe sex. Taking low-dose aspirin every day. Taking vitamin and mineral supplements as recommended by your health care provider. What happens during an annual well check? The services and screenings done by your health care provider during your annual well check will depend on your age, overall health, lifestyle risk factors, and family history of disease. Counseling  Your health care provider may ask you questions about your: Alcohol use. Tobacco use. Drug  use. Emotional well-being. Home and relationship well-being. Sexual activity. Eating habits. History of falls. Memory and ability to understand (cognition). Work and work Statistician. Reproductive health. Screening  You may have the following tests or measurements: Height, weight, and BMI. Blood pressure. Lipid and cholesterol levels. These may be checked every 5 years, or more frequently if you are over 50 years old. Skin check. Lung cancer screening. You may have this screening every year starting at age 7 if you have a 30-pack-year history of smoking and currently smoke or have quit within the past 15 years. Fecal occult blood test (FOBT) of the stool. You may have this test every year starting at age 72. Flexible sigmoidoscopy or colonoscopy. You may have a sigmoidoscopy every 5 years or a colonoscopy every 10 years starting at age 50. Hepatitis C blood test. Hepatitis B blood test. Sexually transmitted disease (STD) testing. Diabetes screening. This is done by checking your blood sugar (glucose) after you have not eaten for a while (fasting). You may have this done every 1-3 years. Bone density scan. This is done to screen for osteoporosis. You may have this done starting at age 30. Mammogram. This may be done every 1-2 years. Talk to your health care provider about how often you should have regular mammograms. Talk with your health care provider about your test results, treatment options, and if necessary, the need for more tests. Vaccines  Your health care provider may recommend certain vaccines, such as: Influenza vaccine. This is recommended every year. Tetanus, diphtheria, and acellular pertussis (Tdap, Td) vaccine. You may need a Td booster every 10 years. Zoster vaccine. You may need this after age 48. Pneumococcal 13-valent conjugate (PCV13) vaccine. One dose is recommended after age 78. Pneumococcal polysaccharide (PPSV23) vaccine. One dose is recommended after age  61. Talk  to your health care provider about which screenings and vaccines you need and how often you need them. This information is not intended to replace advice given to you by your health care provider. Make sure you discuss any questions you have with your health care provider. Document Released: 03/20/2015 Document Revised: 11/11/2015 Document Reviewed: 12/23/2014 Elsevier Interactive Patient Education  2017 [place] Prevention in the Home Falls can cause injuries. They can happen to people of all ages. There are many things you can do to make your home safe and to help prevent falls. What can I do on the outside of my home? Regularly fix the edges of walkways and driveways and fix any cracks. Remove anything that might make you trip as you walk through a door, such as a raised step or threshold. Trim any bushes or trees on the path to your home. Use bright outdoor lighting. Clear any walking paths of anything that might make someone trip, such as rocks or tools. Regularly check to see if handrails are loose or broken. Make sure that both sides of any steps have handrails. Any raised decks and porches should have guardrails on the edges. Have any leaves, snow, or ice cleared regularly. Use sand or salt on walking paths during winter. Clean up any spills in your garage right away. This includes oil or grease spills. What can I do in the bathroom? Use night lights. Install grab bars by the toilet and in the tub and shower. Do not use towel bars as grab bars. Use non-skid mats or decals in the tub or shower. If you need to sit down in the shower, use a plastic, non-slip stool. Keep the floor dry. Clean up any water that spills on the floor as soon as it happens. Remove soap buildup in the tub or shower regularly. Attach bath mats securely with double-sided non-slip rug tape. Do not have throw rugs and other things on the floor that can make you trip. What can I do in the  bedroom? Use night lights. Make sure that you have a light by your bed that is easy to reach. Do not use any sheets or blankets that are too big for your bed. They should not hang down onto the floor. Have a firm chair that has side arms. You can use this for support while you get dressed. Do not have throw rugs and other things on the floor that can make you trip. What can I do in the kitchen? Clean up any spills right away. Avoid walking on wet floors. Keep items that you use a lot in easy-to-reach places. If you need to reach something above you, use a strong step stool that has a grab bar. Keep electrical cords out of the way. Do not use floor polish or wax that makes floors slippery. If you must use wax, use non-skid floor wax. Do not have throw rugs and other things on the floor that can make you trip. What can I do with my stairs? Do not leave any items on the stairs. Make sure that there are handrails on both sides of the stairs and use them. Fix handrails that are broken or loose. Make sure that handrails are as long as the stairways. Check any carpeting to make sure that it is firmly attached to the stairs. Fix any carpet that is loose or worn. Avoid having throw rugs at the top or bottom of the stairs. If you do have throw rugs, attach  them to the floor with carpet tape. Make sure that you have a light switch at the top of the stairs and the bottom of the stairs. If you do not have them, ask someone to add them for you. What else can I do to help prevent falls? Wear shoes that: Do not have high heels. Have rubber bottoms. Are comfortable and fit you well. Are closed at the toe. Do not wear sandals. If you use a stepladder: Make sure that it is fully opened. Do not climb a closed stepladder. Make sure that both sides of the stepladder are locked into place. Ask someone to hold it for you, if possible. Clearly mark and make sure that you can see: Any grab bars or  handrails. First and last steps. Where the edge of each step is. Use tools that help you move around (mobility aids) if they are needed. These include: Canes. Walkers. Scooters. Crutches. Turn on the lights when you go into a dark area. Replace any light bulbs as soon as they burn out. Set up your furniture so you have a clear path. Avoid moving your furniture around. If any of your floors are uneven, fix them. If there are any pets around you, be aware of where they are. Review your medicines with your doctor. Some medicines can make you feel dizzy. This can increase your chance of falling. Ask your doctor what other things that you can do to help prevent falls. This information is not intended to replace advice given to you by your health care provider. Make sure you discuss any questions you have with your health care provider. Document Released: 12/18/2008 Document Revised: 07/30/2015 Document Reviewed: 03/28/2014 Elsevier Interactive Patient Education  2017 [employer].

## 2021-01-07 NOTE — Progress Notes (Signed)
Subjective:   Tiffany Jacobs is a 75 y.o. female who presents for Medicare Annual (Subsequent) preventive examination. I connected with  [name] on 01/07/21 by audio  enabled telemedicine application and verified that I am speaking with the correct person using two identifiers.   I discussed the limitations of evaluation and management by telemedicine. The patient expressed understanding and agreed to proceed.   Review of Systems           Objective:    Today's Vitals   01/07/21 1413  PainSc: 0-No pain   There is no height or weight on file to calculate BMI.  Advanced Directives 01/06/2020 12/20/2017 10/12/2016 10/10/2016 05/19/2015 05/04/2014 11/04/2013  Does Patient Have a Medical Advance Directive? No No No No No No No  Would patient like information on creating a medical advance directive? No - Patient declined Yes (ED - Information included in AVS) No - Patient declined No - Patient declined No - patient declined information Yes - Scientist, clinical (histocompatibility and immunogenetics) given Yes - Scientist, clinical (histocompatibility and immunogenetics) given  Pre-existing out of facility DNR order (yellow form or pink MOST form) - - - - - - -    Current Medications (verified) Outpatient Encounter Medications as of 01/07/2021  Medication Sig   albuterol (PROVENTIL) (2.5 MG/3ML) 0.083% nebulizer solution Take 2.5 mg by nebulization every 6 (six) hours as needed for wheezing or shortness of breath.    albuterol (VENTOLIN HFA) 108 (90 Base) MCG/ACT inhaler Inhale 2 puffs into the lungs every 6 (six) hours as needed for wheezing or shortness of breath.   Alcohol Swabs (B-D SINGLE USE SWABS REGULAR) PADS Use to test as directed   amLODipine (NORVASC) 10 MG tablet Take 1 tablet (10 mg total) by mouth daily.   atorvastatin (LIPITOR) 20 MG tablet Take 1 tablet (20 mg total) by mouth daily.   blood glucose meter kit and supplies Two times daily testing (dispense meter based on insurance preference) dx e11.65   gabapentin (NEURONTIN) 300 MG  capsule Take 1 capsule (300 mg total) by mouth at bedtime.   glipiZIDE (GLUCOTROL XL) 10 MG 24 hr tablet TAKE 2 TABLETS DAILY WITH BREAKFAST   glucose blood test strip Use as instructed once daily dx E11.9   hydrALAZINE (APRESOLINE) 25 MG tablet Take 1 tablet (25 mg total) by mouth 3 (three) times daily.   insulin glargine (LANTUS SOLOSTAR) 100 UNIT/ML Solostar Pen Inject 50 Units into the skin daily.   Insulin Pen Needle 31G X 5 MM MISC To be used with Lantus insulin pen. Dispensed based on patient's insurance coverage. Please make sure these fit on the patients lantus pen   Insulin Pen Needle 31G X 5 MM MISC To be used with Lantus insulin pen. Dispensed based on patient's insurance coverage. Please make sure these fit on the patients lantus pen   Insulin Syringes, Disposable, U-100 1 ML MISC Use to inject insulin daily DX e11.65   latanoprost (XALATAN) 0.005 % ophthalmic solution SMARTSIG:1 Drop(s) In Eye(s) Every Evening   metoprolol tartrate (LOPRESSOR) 25 MG tablet Take 1 tablet (25 mg total) by mouth 2 (two) times daily.   montelukast (SINGULAIR) 10 MG tablet Take 1 tablet (10 mg total) by mouth at bedtime. (Patient not taking: Reported on 12/22/2020)   montelukast (SINGULAIR) 10 MG tablet Take 1 tablet (10 mg total) by mouth at bedtime.   nitroGLYCERIN (NITROSTAT) 0.4 MG SL tablet DISSOLVE ONE TABLET UNDER THE TONGUE EVERY 5 MINUTES AS NEEDED FOR CHEST PAIN. DO NOT  EXCEED A TOTAL OF 3 DOSES IN 15 MINUTES   pantoprazole (PROTONIX) 20 MG tablet Take 1 tablet (20 mg total) by mouth daily.   spironolactone (ALDACTONE) 25 MG tablet Take 1 tablet (25 mg total) by mouth daily.   spironolactone (ALDACTONE) 25 MG tablet Take 1 tablet (25 mg total) by mouth daily.   TRUEplus Lancets 33G MISC TEST TWO TIMES DAILY   No facility-administered encounter medications on file as of 01/07/2021.    Allergies (verified) Ace inhibitors and Latex   History: Past Medical History:  Diagnosis Date   Arthritis     Asthma 1963   Chest pain with high risk for cardiac etiology 08/21/2017   Diabetes ([place])    Diabetes mellitus, type 2 ([place]) 2004   GERD (gastroesophageal reflux disease) 2004   Hypertension 2004   Low back pain    Obesity    RAD (reactive airway disease)    Sciatica    Seasonal allergies    Past Surgical History:  Procedure Laterality Date   ACROMIO-CLAVICULAR JOINT REPAIR Right 11/09/2012   Procedure: ACROMIO-CLAVICULAR JOINT REPAIR;  Surgeon: Carole Civil, MD;  Location: [facility];  Service: Orthopedics;  Laterality: Right;   COLONOSCOPY N/A 10/10/2016   Procedure: COLONOSCOPY;  Surgeon: Danie Binder, MD;  Location: [facility];  Service: Endoscopy;  Laterality: N/A;  10:30   EYE SURGERY Right 05/09/2018   cataract   left breast biopsy for benign disease     SHOULDER ACROMIOPLASTY Right 11/09/2012   Procedure: RIGHT SHOULDER ACROMIOPLASTY;  Surgeon: Carole Civil, MD;  Location: [facility];  Service: Orthopedics;  Laterality: Right;   SHOULDER OPEN ROTATOR CUFF REPAIR Right 11/09/2012   Procedure: ROTATOR CUFF REPAIR Right SHOULDER OPEN;  Surgeon: Carole Civil, MD;  Location: [facility];  Service: Orthopedics;  Laterality: Right;   total knee arthroplasty left  02-01-05   Dr. Aline Brochure   TUBAL LIGATION  1970   Family History  Problem Relation Age of Onset   Kidney failure Father    Kidney disease Father    Stroke Brother 63       used drugs , stroke and heart disease   Diabetes Brother    Hypertension Brother    Cancer Daughter 54       breast    Diabetes Mother    Asthma Other    Lung disease Other    Cancer Other    Heart disease Other    Arthritis Other    Colon cancer Neg Hx    Colon polyps Neg Hx    Social History   Socioeconomic History   Marital status: Married    Spouse name: Jeneen Rinks    Number of children: 7   Years of education: 8   Highest education level: GED or equivalent  Occupational History   Occupation: disabled   Tobacco Use   Smoking  status: Former    Packs/day: 0.50    Years: 37.00    Pack years: 18.50    Types: Cigarettes    Quit date: 10/12/1988    Years since quitting: 32.2   Smokeless tobacco: Never  Vaping Use   Vaping Use: Never used  Substance and Sexual Activity   Alcohol use: No   Drug use: No   Sexual activity: Not Currently    Birth control/protection: Post-menopausal  Other Topics Concern   Not on file  Social History Narrative   Not on file   Social Determinants of Radio broadcast assistant  Strain: Not on file  Food Insecurity: Not on file  Transportation Needs: Not on file  Physical Activity: Not on file  Stress: Not on file  Social Connections: Not on file    Tobacco Counseling Counseling given: Not Answered   Clinical Intake:     Pain Score: 0-No pain           Diabetic?Yes         Activities of Daily Living No flowsheet data found.  Patient Care Team: Fayrene Helper, MD as PCP - General Herminio Commons, MD (Inactive) as PCP - Cardiology (Cardiology) Carole Civil, MD as Consulting Physician (Orthopedic Surgery) Leta Baptist, MD as Consulting Physician (Otolaryngology) Eloise Harman, DO as Consulting Physician (Internal Medicine)  Indicate any recent Medical Services you may have received from other than [facility] providers in the past year (date may be approximate).     Assessment:   This is a routine wellness examination for [place].  Hearing/Vision screen No results found.  Dietary issues and exercise activities discussed:     Goals Addressed   None   Depression Screen PHQ 2/9 Scores 12/22/2020 08/13/2020 05/11/2020 01/06/2020 01/06/2020 12/18/2019 09/17/2019  PHQ - 2 Score 0 0 0 0 0 0 0  PHQ- 9 Score 0 - - - - - -    Fall Risk Fall Risk  12/22/2020 08/13/2020 05/11/2020 01/06/2020 12/18/2019  Falls in the past year? 0 0 0 0 0  Number falls in past yr: - 0 0 0 -  Injury with Fall? - 0 0 0 -  Risk for fall due to : - No Fall Risks No Fall  Risks No Fall Risks -  Risk for fall due to: Comment - - - - -  Follow up - Falls evaluation completed Falls evaluation completed Falls evaluation completed -    FALL RISK PREVENTION PERTAINING TO THE HOME:  Any stairs in or around the home? Yes  If so, are there any without handrails? No  Home free of loose throw rugs in walkways, pet beds, electrical cords, etc? Yes  Adequate lighting in your home to reduce risk of falls? Yes   ASSISTIVE DEVICES UTILIZED TO PREVENT FALLS:  Life alert? No  Use of a cane, Courtnee Myer or w/c? No  Grab bars in the bathroom? Yes  Shower chair or bench in shower? No  Elevated toilet seat or a handicapped toilet? No   TIMED UP AND GO:  Was the test performed? No .  Length of time to ambulate 10 feet: NA sec.     Cognitive Function:     6CIT Screen 01/06/2020 12/25/2018 12/20/2017 10/12/2016  What Year? 0 points 0 points 0 points 0 points  What month? 0 points 0 points 0 points 0 points  What time? 0 points 0 points 0 points 0 points  Count back from 20 0 points 0 points 0 points 0 points  Months in reverse 0 points 0 points 2 points 0 points  Repeat phrase 0 points 0 points 0 points 0 points  Total Score 0 0 2 0    Immunizations Immunization History  Administered Date(s) Administered   Fluad Quad(high Dose 65+) 12/04/2018, 12/18/2019, 12/22/2020   Influenza Whole 03/29/2006, 11/27/2009   Influenza,inj,Quad PF,6+ Mos 03/20/2013, 01/24/2014, 12/25/2014, 01/22/2016, 10/31/2016, 01/31/2017, 10/19/2017   Pneumococcal Conjugate-13 06/06/2014   Pneumococcal Polysaccharide-23 08/27/2004, 11/27/2009, 08/27/2015   Td 08/27/2004    TDAP status: Due, Education has been provided regarding the importance of this vaccine. Advised may  receive this vaccine at local pharmacy or Health Dept. Aware to provide a copy of the vaccination record if obtained from local pharmacy or Health Dept. Verbalized acceptance and understanding.  Flu Vaccine status: Up to  date  Pneumococcal vaccine status: Up to date  Covid-19 vaccine status: Declined, Education has been provided regarding the importance of this vaccine but patient still declined. Advised may receive this vaccine at local pharmacy or Health Dept.or vaccine clinic. Aware to provide a copy of the vaccination record if obtained from local pharmacy or Health Dept. Verbalized acceptance and understanding.  Qualifies for Shingles Vaccine? Yes   Zostavax completed No   Shingrix Completed?: No.    Education has been provided regarding the importance of this vaccine. Patient has been advised to call insurance company to determine out of pocket expense if they have not yet received this vaccine. Advised may also receive vaccine at local pharmacy or Health Dept. Verbalized acceptance and understanding.  Screening Tests Health Maintenance  Topic Date Due   Zoster Vaccines- Shingrix (1 of 2) Never done   TETANUS/TDAP  08/28/2014   OPHTHALMOLOGY EXAM  06/04/2020   COVID-19 Vaccine (1) 01/07/2021 (Originally 02/12/1946)   HEMOGLOBIN A1C  06/22/2021   FOOT EXAM  12/22/2021   COLONOSCOPY (Pts 45-61yr Insurance coverage will need to be confirmed)  10/11/2026   Pneumonia Vaccine 75 Years old  Completed   INFLUENZA VACCINE  Completed   DEXA SCAN  Completed   Hepatitis C Screening  Completed   HPV VACCINES  Aged Out    Health Maintenance  Health Maintenance Due  Topic Date Due   Zoster Vaccines- Shingrix (1 of 2) Never done   TETANUS/TDAP  08/28/2014   OPHTHALMOLOGY EXAM  06/04/2020    Colorectal cancer screening: Type of screening: Colonoscopy. Completed 10/10/2016. Repeat every 10 years  Mammogram status: No longer required due to age.  Bone Density status: Completed 01/24/2013. Results reflect: Bone density results: NORMAL. Repeat every 10 years.  Lung Cancer Screening: (Low Dose CT Chest recommended if Age 75-80years, 30 pack-year currently smoking OR have quit w/in 15years.) does not  qualify.   Lung Cancer Screening Referral: NA  Additional Screening:  Hepatitis C Screening: does qualify; Completed 05/26/2015  Vision Screening: Recommended annual ophthalmology exams for early detection of glaucoma and other disorders of the eye. Is the patient up to date with their annual eye exam?  Yes  Who is the provider or what is the name of the office in which the patient attends annual eye exams? Dr. SGriffin Dakinoffice If pt is not established with a provider, would they like to be referred to a provider to establish care? No .   Dental Screening: Recommended annual dental exams for proper oral hygiene  Community Resource Referral / Chronic Care Management: CRR required this visit?  No   CCM required this visit?  No      Plan:     I have personally reviewed and noted the following in the patient's chart:   Medical and social history Use of alcohol, tobacco or illicit drugs  Current medications and supplements including opioid prescriptions.  Functional ability and status Nutritional status Physical activity Advanced directives List of other physicians Hospitalizations, surgeries, and ER visits in previous 12 months Vitals Screenings to include cognitive, depression, and falls Referrals and appointments  In addition, I have reviewed and discussed with patient certain preventive protocols, quality metrics, and best practice recommendations. A written personalized care plan for preventive services as well  as general preventive health recommendations were provided to patient.     Danella Penton, [place]   01/07/2021   Nurse Notes: This was a telehealth visit. The patient was at home, the provider was in the office. Tula Nakayama, MD

## 2021-01-08 ENCOUNTER — Other Ambulatory Visit: Payer: Self-pay | Admitting: Family Medicine

## 2021-01-13 NOTE — Patient Instructions (Incomplete)

## 2021-01-14 ENCOUNTER — Ambulatory Visit: Payer: Medicare HMO | Admitting: Nurse Practitioner

## 2021-01-14 ENCOUNTER — Other Ambulatory Visit: Payer: Self-pay

## 2021-01-14 DIAGNOSIS — E1159 Type 2 diabetes mellitus with other circulatory complications: Secondary | ICD-10-CM

## 2021-01-14 DIAGNOSIS — I10 Essential (primary) hypertension: Secondary | ICD-10-CM

## 2021-01-14 DIAGNOSIS — E782 Mixed hyperlipidemia: Secondary | ICD-10-CM

## 2021-01-20 ENCOUNTER — Encounter: Payer: Self-pay | Admitting: Nurse Practitioner

## 2021-01-20 ENCOUNTER — Other Ambulatory Visit: Payer: Self-pay

## 2021-01-20 ENCOUNTER — Ambulatory Visit: Payer: Medicare HMO | Admitting: Nurse Practitioner

## 2021-01-20 VITALS — BP 134/77 | HR 94 | Ht 62.5 in | Wt 193.8 lb

## 2021-01-20 DIAGNOSIS — E1159 Type 2 diabetes mellitus with other circulatory complications: Secondary | ICD-10-CM

## 2021-01-20 DIAGNOSIS — E782 Mixed hyperlipidemia: Secondary | ICD-10-CM

## 2021-01-20 DIAGNOSIS — I10 Essential (primary) hypertension: Secondary | ICD-10-CM | POA: Diagnosis not present

## 2021-01-20 MED ORDER — TOUJEO MAX SOLOSTAR 300 UNIT/ML ~~LOC~~ SOPN: 50.0000 [IU] | PEN_INJECTOR | Freq: Every evening | SUBCUTANEOUS | 3 refills | Status: DC

## 2021-01-20 NOTE — Progress Notes (Signed)
01/20/2021, 3:54 PM  Endocrinology follow-up note  Subjective:    Patient ID: Tiffany Jacobs, female    DOB: [birth year].  Tiffany Jacobs is being seen for follow-up of currently uncontrolled type 2 diabetes. PMD:  Fayrene Helper, MD.   Past Medical History:  Diagnosis Date   Arthritis    Asthma 1963   Chest pain with high risk for cardiac etiology 08/21/2017   Diabetes ([place])    Diabetes mellitus, type 2 ([place]) 2004   GERD (gastroesophageal reflux disease) 2004   Hypertension 2004   Low back pain    Obesity    RAD (reactive airway disease)    Sciatica    Seasonal allergies     Past Surgical History:  Procedure Laterality Date   ACROMIO-CLAVICULAR JOINT REPAIR Right 11/09/2012   Procedure: ACROMIO-CLAVICULAR JOINT REPAIR;  Surgeon: Carole Civil, MD;  Location: [facility];  Service: Orthopedics;  Laterality: Right;   COLONOSCOPY N/A 10/10/2016   Procedure: COLONOSCOPY;  Surgeon: Danie Binder, MD;  Location: [facility];  Service: Endoscopy;  Laterality: N/A;  10:30   EYE SURGERY Right 05/09/2018   cataract   left breast biopsy for benign disease     SHOULDER ACROMIOPLASTY Right 11/09/2012   Procedure: RIGHT SHOULDER ACROMIOPLASTY;  Surgeon: Carole Civil, MD;  Location: [facility];  Service: Orthopedics;  Laterality: Right;   SHOULDER OPEN ROTATOR CUFF REPAIR Right 11/09/2012   Procedure: ROTATOR CUFF REPAIR Right SHOULDER OPEN;  Surgeon: Carole Civil, MD;  Location: [facility];  Service: Orthopedics;  Laterality: Right;   total knee arthroplasty left  02-01-05   Dr. Aline Brochure   TUBAL LIGATION  1970    Social History   Socioeconomic History   Marital status: Married    Spouse name: Jeneen Rinks    Number of children: 7   Years of education: 8   Highest education level: GED or equivalent  Occupational History   Occupation: disabled   Tobacco Use   Smoking status: Former    Packs/day: 0.50    Years: 37.00    Pack years: 18.50     Types: Cigarettes    Quit date: 10/12/1988    Years since quitting: 32.2   Smokeless tobacco: Never  Vaping Use   Vaping Use: Never used  Substance and Sexual Activity   Alcohol use: No   Drug use: No   Sexual activity: Not Currently    Birth control/protection: Post-menopausal  Other Topics Concern   Not on file  Social History Narrative   Not on file   Social Determinants of Health   Financial Resource Strain: Low Risk    Difficulty of Paying Living Expenses: Not hard at all  Food Insecurity: No Food Insecurity   Worried About Charity fundraiser in the Last Year: Never true   [place] in the Last Year: Never true  Transportation Needs: No Transportation Needs   Lack of Transportation (Medical): No   Lack of Transportation (Non-Medical): No  Physical Activity: Sufficiently Active   Days of Exercise per Week: 7 days   Minutes of Exercise per Session: 60 min  Stress: Not on file  Social Connections: Socially Isolated   Frequency of Communication with Friends and Family: More than three times a week   Frequency of Social Gatherings with Friends and Family: More than three times a week   Attends Religious Services: Never   Marine scientist or Organizations:  No   Attends Archivist Meetings: Never   Marital Status: Widowed    Family History  Problem Relation Age of Onset   Kidney failure Father    Kidney disease Father    Stroke Brother 33       used drugs , stroke and heart disease   Diabetes Brother    Hypertension Brother    Cancer Daughter 29       breast    Diabetes Mother    Asthma Other    Lung disease Other    Cancer Other    Heart disease Other    Arthritis Other    Colon cancer Neg Hx    Colon polyps Neg Hx     Outpatient Encounter Medications as of 01/20/2021  Medication Sig   albuterol (PROVENTIL) (2.5 MG/3ML) 0.083% nebulizer solution Take 2.5 mg by nebulization every 6 (six) hours as needed for wheezing or shortness of  breath.    albuterol (VENTOLIN HFA) 108 (90 Base) MCG/ACT inhaler Inhale 2 puffs into the lungs every 6 (six) hours as needed for wheezing or shortness of breath.   Alcohol Swabs (B-D SINGLE USE SWABS REGULAR) PADS Use to test as directed   amLODipine (NORVASC) 10 MG tablet Take 1 tablet (10 mg total) by mouth daily.   atorvastatin (LIPITOR) 20 MG tablet Take 1 tablet (20 mg total) by mouth daily.   blood glucose meter kit and supplies Two times daily testing (dispense meter based on insurance preference) dx e11.65   gabapentin (NEURONTIN) 300 MG capsule Take 1 capsule (300 mg total) by mouth at bedtime.   glipiZIDE (GLUCOTROL XL) 10 MG 24 hr tablet TAKE 2 TABLETS DAILY WITH BREAKFAST   glucose blood test strip Use as instructed once daily dx E11.9   hydrALAZINE (APRESOLINE) 25 MG tablet Take 1 tablet (25 mg total) by mouth 3 (three) times daily.   insulin glargine, 2 Unit Dial, (TOUJEO MAX SOLOSTAR) 300 UNIT/ML Solostar Pen Inject 50 Units into the skin at bedtime.   Insulin Pen Needle 31G X 5 MM MISC To be used with Lantus insulin pen. Dispensed based on patient's insurance coverage. Please make sure these fit on the patients lantus pen   Insulin Pen Needle 31G X 5 MM MISC To be used with Lantus insulin pen. Dispensed based on patient's insurance coverage. Please make sure these fit on the patients lantus pen   latanoprost (XALATAN) 0.005 % ophthalmic solution SMARTSIG:1 Drop(s) In Eye(s) Every Evening   metoprolol tartrate (LOPRESSOR) 25 MG tablet Take 1 tablet (25 mg total) by mouth 2 (two) times daily.   montelukast (SINGULAIR) 10 MG tablet Take 1 tablet (10 mg total) by mouth at bedtime.   montelukast (SINGULAIR) 10 MG tablet Take 1 tablet (10 mg total) by mouth at bedtime.   nitroGLYCERIN (NITROSTAT) 0.4 MG SL tablet DISSOLVE ONE TABLET UNDER THE TONGUE EVERY 5 MINUTES AS NEEDED FOR CHEST PAIN. DO NOT EXCEED A TOTAL OF 3 DOSES IN 15 MINUTES   pantoprazole (PROTONIX) 20 MG tablet Take 1  tablet (20 mg total) by mouth daily.   spironolactone (ALDACTONE) 25 MG tablet Take 1 tablet (25 mg total) by mouth daily.   spironolactone (ALDACTONE) 25 MG tablet Take 1 tablet (25 mg total) by mouth daily.   TRUEplus Lancets 33G MISC TEST BLOOD SUGAR THREE TIMES DAILY   [DISCONTINUED] insulin glargine (LANTUS SOLOSTAR) 100 UNIT/ML Solostar Pen Inject 50 Units into the skin daily.   [DISCONTINUED] Insulin Syringes, Disposable, U-100 1  ML MISC Use to inject insulin daily DX e11.65 (Patient not taking: Reported on 01/20/2021)   No facility-administered encounter medications on file as of 01/20/2021.    ALLERGIES: Allergies  Allergen Reactions   Ace Inhibitors Cough   Latex Itching and Rash    VACCINATION STATUS: Immunization History  Administered Date(s) Administered   Fluad Quad(high Dose 65+) 12/04/2018, 12/18/2019, 12/22/2020   Influenza Whole 03/29/2006, 11/27/2009   Influenza,inj,Quad PF,6+ Mos 03/20/2013, 01/24/2014, 12/25/2014, 01/22/2016, 10/31/2016, 01/31/2017, 10/19/2017   Pneumococcal Conjugate-13 06/06/2014   Pneumococcal Polysaccharide-23 08/27/2004, 11/27/2009, 08/27/2015   Td 08/27/2004    Diabetes She presents for her follow-up diabetic visit. She has type 2 diabetes mellitus. Onset time: She was diagnosed at approximate age of 20. Her disease course has been worsening. There are no hypoglycemic associated symptoms. Pertinent negatives for hypoglycemia include no confusion, headaches, pallor or seizures. Associated symptoms include blurred vision, fatigue, polydipsia and polyuria. Pertinent negatives for diabetes include no chest pain and no polyphagia. There are no hypoglycemic complications. Symptoms are stable. Diabetic complications include nephropathy. Risk factors for coronary artery disease include dyslipidemia, diabetes mellitus, obesity, sedentary lifestyle, post-menopausal, family history and hypertension. Current diabetic treatment includes insulin injections  and oral agent (monotherapy) (Had run out of Lantus for over 3 months). She is compliant with treatment most of the time. Her weight is fluctuating minimally. She is following a generally unhealthy diet. When asked about meal planning, she reported none. She has had a previous visit with a dietitian. She never participates in exercise. Her home blood glucose trend is fluctuating minimally. Her breakfast blood glucose range is generally >200 mg/dl. Her overall blood glucose range is >200 mg/dl. (She presents today with her logs, no meter, showing significantly high fasting glycemic profile in the 300s.  Her most recent A1c on 12/22/20 was 14.6% at her PCP office.  She has not been seen in this office for over a year.  She reports her PCP was supposed to be sending in refill of her Lantus but never did, therefore she has been out of it for over 3 months.  She has only been taking Glipizide 20 mg XL daily with breakfast.  She denies any hypoglycemia.) An ACE inhibitor/angiotensin II receptor blocker is not being taken. Eye exam is current.  Hypertension This is a chronic problem. The current episode started more than 1 year ago. The problem has been gradually improving since onset. The problem is controlled. Associated symptoms include blurred vision. Pertinent negatives include no chest pain or headaches. There are no associated agents to hypertension. Risk factors for coronary artery disease include diabetes mellitus, dyslipidemia, family history, obesity, post-menopausal state and sedentary lifestyle. Past treatments include diuretics, beta blockers, calcium channel blockers and direct vasodilators. The current treatment provides moderate improvement. Compliance problems include diet and exercise.  Hypertensive end-organ damage includes kidney disease. Identifiable causes of hypertension include chronic renal disease.  Hyperlipidemia This is a chronic problem. The current episode started more than 1 year ago.  The problem is controlled. Recent lipid tests were reviewed and are normal. Exacerbating diseases include chronic renal disease, diabetes and obesity. Factors aggravating her hyperlipidemia include fatty foods and beta blockers. Pertinent negatives include no chest pain. Current antihyperlipidemic treatment includes statins. The current treatment provides moderate improvement of lipids. Compliance problems include adherence to diet and adherence to exercise.  Risk factors for coronary artery disease include diabetes mellitus, dyslipidemia, family history, obesity, hypertension, post-menopausal and a sedentary lifestyle.   Review of systems  Constitutional: +  Minimally fluctuating body weight,  current Body mass index is 34.88 kg/m. , + fatigue, no subjective hyperthermia, no subjective hypothermia Eyes: + blurry vision, no xerophthalmia ENT: no sore throat, no nodules palpated in throat, no dysphagia/odynophagia, no hoarseness Cardiovascular: no chest pain, no shortness of breath, no palpitations, no leg swelling Respiratory: no cough, no shortness of breath Gastrointestinal: no nausea/vomiting/diarrhea Musculoskeletal: no muscle/joint aches Skin: no rashes, no hyperemia Neurological: no tremors, no numbness, no tingling, no dizziness Psychiatric: no depression, no anxiety    Objective:    BP 134/77   Pulse 94   Ht 5' 2.5" (1.588 m)   Wt 193 lb 12.8 oz (87.9 kg)   BMI 34.88 kg/m   Wt Readings from Last 3 Encounters:  01/20/21 193 lb 12.8 oz (87.9 kg)  12/22/20 192 lb 0.6 oz (87.1 kg)  08/13/20 193 lb (87.5 kg)    BP Readings from Last 3 Encounters:  01/20/21 134/77  12/22/20 (!) 167/90  08/13/20 (!) 134/91    Physical Exam- Limited  Constitutional:  Body mass index is 34.88 kg/m. , not in acute distress, normal state of mind Eyes:  EOMI, no exophthalmos Neck: Supple Cardiovascular: RRR, no murmurs, rubs, or gallops, no edema Respiratory: Adequate breathing efforts, no  crackles, rales, rhonchi, or wheezing Musculoskeletal: no gross deformities, strength intact in all four extremities, no gross restriction of joint movements Skin:  no rashes, no hyperemia Neurological: no tremor with outstretched hands  CMP     Component Value Date/Time   NA 136 12/22/2020 1602   K 4.4 12/22/2020 1602   CL 94 (L) 12/22/2020 1602   CO2 27 12/22/2020 1602   GLUCOSE 370 (H) 12/22/2020 1602   GLUCOSE 178 (H) 05/21/2019 0929   BUN 11 12/22/2020 1602   CREATININE 0.80 12/22/2020 1602   CREATININE 0.73 05/21/2019 0929   CALCIUM 10.2 12/22/2020 1602   PROT 7.1 08/13/2020 1614   ALBUMIN 4.1 08/13/2020 1614   AST 28 08/13/2020 1614   ALT 25 08/13/2020 1614   ALKPHOS 127 (H) 08/13/2020 1614   BILITOT 0.3 08/13/2020 1614   GFRNONAA 76 12/12/2019 1050   GFRNONAA 82 05/21/2019 0929   GFRAA 88 12/12/2019 1050   GFRAA 95 05/21/2019 0929     Diabetic Labs (most recent): Lab Results  Component Value Date   HGBA1C 14.6 (H) 12/22/2020   HGBA1C 13.2 (A) 08/13/2020   HGBA1C 9.9 (A) 05/11/2020     Lipid Panel ( most recent) Lipid Panel     Component Value Date/Time   CHOL 172 08/13/2020 1614   TRIG 151 (H) 08/13/2020 1614   HDL 51 08/13/2020 1614   CHOLHDL 3.4 08/13/2020 1614   CHOLHDL 3.0 05/21/2019 0929   VLDL 20 07/22/2016 1004   LDLCALC 95 08/13/2020 1614   LDLCALC 107 (H) 05/21/2019 0929      Lab Results  Component Value Date   TSH 1.730 08/13/2020   TSH 2.58 05/21/2019   TSH 1.71 01/31/2017   TSH 2.75 05/26/2015   TSH 3.261 06/02/2014   TSH 3.039 05/07/2013   TSH 3.308 10/10/2011   TSH 2.811 03/19/2010   TSH 3.422 08/12/2009   FREET4 0.9 05/21/2019       Assessment & Plan:   1) Type 2 diabetes mellitus with vascular disease ([place])  - Tiffany Jacobs has currently uncontrolled symptomatic type 2 DM since  75 years of age.  She presents today with her logs, no meter, showing significantly high fasting glycemic profile in the 300s.  Her  most recent A1c on 12/22/20 was 14.6% at her PCP office.  She has not been seen in this office for over a year.  She reports her PCP was supposed to be sending in refill of her Lantus but never did, therefore she has been out of it for over 3 months.  She has only been taking Glipizide 20 mg XL daily with breakfast.  She denies any hypoglycemia.  Recent labs reviewed.  -her diabetes is complicated by obesity/sedentary life and she remains at a high risk for more acute and chronic complications which include CAD, CVA, CKD, retinopathy, and neuropathy. These are all discussed in detail with her.  - Nutritional counseling repeated at each appointment due to patients tendency to fall back in to old habits.  - The patient admits there is a room for improvement in their diet and drink choices. -  Suggestion is made for the patient to avoid simple carbohydrates from their diet including Cakes, Sweet Desserts / Pastries, Ice Cream, Soda (diet and regular), Sweet Tea, Candies, Chips, Cookies, Sweet Pastries, Store Bought Juices, Alcohol in Excess of 1-2 drinks a day, Artificial Sweeteners, Coffee Creamer, and "Sugar-free" Products. This will help patient to have stable blood glucose profile and potentially avoid unintended weight gain.   - I encouraged the patient to switch to unprocessed or minimally processed complex starch and increased protein intake (animal or plant source), fruits, and vegetables.   - Patient is advised to stick to a routine mealtimes to eat 3 meals a day and avoid unnecessary snacks (to snack only to correct hypoglycemia).  - she is following with Jearld Fenton, RDN, CDE for individualized diabetes education.  - I have approached her with the following individualized plan to manage diabetes and patient agrees:   - She is advised to restart basal insulin Toujeo 50 units SQ nightly (insurance prefers this medication over Lantus).  I did give her a sample to hold her over until her  mail order pharmacy can send this to her.  I also lowered her Glipizide to 10 mg XL daily with breakfast, a more suitable dose for her.  -She is encouraged to start monitoring blood glucose 4 times daily, before meals and before bed, to record her readings on the clinic logs provided and bring them with her to her follow up appointment in 2 weeks to review.  Will make necessary adjustments to medications at that time.   - she is warned not to take insulin without proper monitoring per orders. - she is encouraged to call clinic for blood glucose levels less than 70 or above 300 mg /dl.  - Patient specific target  A1c;  LDL, HDL, Triglycerides, and  Waist Circumference were discussed in detail.  2) Blood Pressure /Hypertension:  Her blood pressure is controlled to target.  she is advised to continue her current medications including Amlodipine 10 mg po daily, Spironolactone 25 mg po daily, Hydralazine 25 mg po daily, and Metoprolol 25 mg po daily.  3) Lipids/Hyperlipidemia:    Her most recent lipid panel from 08/13/20 shows controlled LDL of 95 and elevated triglycerides of 151.   she is advised to continue Atorvastatin 20 mg p.o. daily at bedtime.  Side effects and precautions discussed with her.  4)  Weight/Diet:  Her Body mass index is 34.88 kg/m.-   clearly complicating her diabetes care.  I discussed with her the fact that loss of 5 - 10% of her  current body weight will have the  most impact on her diabetes management.  CDE Consult will be initiated . Exercise, and detailed carbohydrates information provided  -  detailed on discharge instructions.  5) Chronic Care/Health Maintenance: -she is on Statin medications and  is encouraged to initiate and continue to follow up with Ophthalmology, Dentist,  Podiatrist at least yearly or according to recommendations, and advised to stay away from smoking. I have recommended yearly flu vaccine and pneumonia vaccine at least every 5 years; moderate  intensity exercise for up to 150 minutes weekly; and  sleep for at least 7 hours a day.  - she is advised to maintain close follow up with Fayrene Helper, MD for primary care needs, as well as her other providers for optimal and coordinated care.    I spent 40 minutes in the care of the patient today including review of labs from [place], Lipids, Thyroid Function, Hematology (current and previous including abstractions from other facilities); face-to-face time discussing  her blood glucose readings/logs, discussing hypoglycemia and hyperglycemia episodes and symptoms, medications doses, her options of short and long term treatment based on the latest standards of care / guidelines;  discussion about incorporating lifestyle medicine;  and documenting the encounter.    Please refer to Patient Instructions for Blood Glucose Monitoring and Insulin/Medications Dosing Guide"  in media tab for additional information. Please  also refer to " Patient Self Inventory" in the Media  tab for reviewed elements of pertinent patient history.  Tiffany Jacobs participated in the discussions, expressed understanding, and voiced agreement with the above plans.  All questions were answered to her satisfaction. she is encouraged to contact clinic should she have any questions or concerns prior to her return visit.   Follow up plan: - Return in about 2 weeks (around 02/03/2021) for Diabetes F/U, Bring meter and logs.  Rayetta Pigg, [facility] [address] [place], [place] [address] Phone: [phone] Fax: [phone]   01/20/2021, 3:54 PM

## 2021-01-20 NOTE — Patient Instructions (Signed)

## 2021-01-21 ENCOUNTER — Other Ambulatory Visit: Payer: Self-pay | Admitting: Family Medicine

## 2021-02-02 NOTE — Patient Instructions (Signed)

## 2021-02-03 ENCOUNTER — Ambulatory Visit: Payer: Medicare HMO | Admitting: Nurse Practitioner

## 2021-02-03 ENCOUNTER — Encounter: Payer: Self-pay | Admitting: Nurse Practitioner

## 2021-02-03 ENCOUNTER — Other Ambulatory Visit: Payer: Self-pay

## 2021-02-03 VITALS — BP 147/76 | HR 89 | Ht 62.5 in | Wt 192.0 lb

## 2021-02-03 DIAGNOSIS — E1159 Type 2 diabetes mellitus with other circulatory complications: Secondary | ICD-10-CM

## 2021-02-03 DIAGNOSIS — I10 Essential (primary) hypertension: Secondary | ICD-10-CM

## 2021-02-03 DIAGNOSIS — E782 Mixed hyperlipidemia: Secondary | ICD-10-CM | POA: Diagnosis not present

## 2021-02-03 MED ORDER — OZEMPIC (0.25 OR 0.5 MG/DOSE) 2 MG/1.5ML ~~LOC~~ SOPN: 0.5000 mg | PEN_INJECTOR | SUBCUTANEOUS | 3 refills | Status: DC

## 2021-02-03 NOTE — Progress Notes (Signed)
02/03/2021, 2:52 PM  Endocrinology follow-up note  Subjective:    Patient ID: Tiffany Jacobs, female    DOB: 23-Sep-1945.  Tiffany Jacobs is being seen for follow-up of currently uncontrolled type 2 diabetes. PMD:  Fayrene Helper, MD.   Past Medical History:  Diagnosis Date   Arthritis    Asthma 1963   Chest pain with high risk for cardiac etiology 08/21/2017   Diabetes ([place])    Diabetes mellitus, type 2 ([place]) 2004   GERD (gastroesophageal reflux disease) 2004   Hypertension 2004   Low back pain    Obesity    RAD (reactive airway disease)    Sciatica    Seasonal allergies     Past Surgical History:  Procedure Laterality Date   ACROMIO-CLAVICULAR JOINT REPAIR Right 11/09/2012   Procedure: ACROMIO-CLAVICULAR JOINT REPAIR;  Surgeon: Carole Civil, MD;  Location: [facility];  Service: Orthopedics;  Laterality: Right;   COLONOSCOPY N/A 10/10/2016   Procedure: COLONOSCOPY;  Surgeon: Danie Binder, MD;  Location: [facility];  Service: Endoscopy;  Laterality: N/A;  10:30   EYE SURGERY Right 05/09/2018   cataract   left breast biopsy for benign disease     SHOULDER ACROMIOPLASTY Right 11/09/2012   Procedure: RIGHT SHOULDER ACROMIOPLASTY;  Surgeon: Carole Civil, MD;  Location: [facility];  Service: Orthopedics;  Laterality: Right;   SHOULDER OPEN ROTATOR CUFF REPAIR Right 11/09/2012   Procedure: ROTATOR CUFF REPAIR Right SHOULDER OPEN;  Surgeon: Carole Civil, MD;  Location: [facility];  Service: Orthopedics;  Laterality: Right;   total knee arthroplasty left  02-01-05   Dr. Aline Brochure   TUBAL LIGATION  1970    Social History   Socioeconomic History   Marital status: Married    Spouse name: Jeneen Rinks    Number of children: 7   Years of education: 8   Highest education level: GED or equivalent  Occupational History   Occupation: disabled   Tobacco Use   Smoking status: Former    Packs/day: 0.50    Years: 37.00    Pack years: 18.50     Types: Cigarettes    Quit date: 10/12/1988    Years since quitting: 32.3   Smokeless tobacco: Never  Vaping Use   Vaping Use: Never used  Substance and Sexual Activity   Alcohol use: No   Drug use: No   Sexual activity: Not Currently    Birth control/protection: Post-menopausal  Other Topics Concern   Not on file  Social History Narrative   Not on file   Social Determinants of Health   Financial Resource Strain: Low Risk    Difficulty of Paying Living Expenses: Not hard at all  Food Insecurity: No Food Insecurity   Worried About Charity fundraiser in the Last Year: Never true   [place] in the Last Year: Never true  Transportation Needs: No Transportation Needs   Lack of Transportation (Medical): No   Lack of Transportation (Non-Medical): No  Physical Activity: Sufficiently Active   Days of Exercise per Week: 7 days   Minutes of Exercise per Session: 60 min  Stress: Not on file  Social Connections: Socially Isolated   Frequency of Communication with Friends and Family: More than three times a week   Frequency of Social Gatherings with Friends and Family: More than three times a week   Attends Religious Services: Never   Marine scientist or Organizations:  No   Attends Archivist Meetings: Never   Marital Status: Widowed    Family History  Problem Relation Age of Onset   Kidney failure Father    Kidney disease Father    Stroke Brother 80       used drugs , stroke and heart disease   Diabetes Brother    Hypertension Brother    Cancer Daughter 47       breast    Diabetes Mother    Asthma Other    Lung disease Other    Cancer Other    Heart disease Other    Arthritis Other    Colon cancer Neg Hx    Colon polyps Neg Hx     Outpatient Encounter Medications as of 02/03/2021  Medication Sig   glipiZIDE (GLUCOTROL XL) 10 MG 24 hr tablet Take 10 mg by mouth daily with breakfast.   Semaglutide,0.25 or 0.5MG/DOS, (OZEMPIC, 0.25 OR 0.5 MG/DOSE,) 2  MG/1.5ML SOPN Inject 0.5 mg into the skin once a week.   albuterol (PROVENTIL) (2.5 MG/3ML) 0.083% nebulizer solution Take 2.5 mg by nebulization every 6 (six) hours as needed for wheezing or shortness of breath.    albuterol (VENTOLIN HFA) 108 (90 Base) MCG/ACT inhaler Inhale 2 puffs into the lungs every 6 (six) hours as needed for wheezing or shortness of breath.   Alcohol Swabs (B-D SINGLE USE SWABS REGULAR) PADS Use to test as directed   amLODipine (NORVASC) 10 MG tablet TAKE 1 TABLET EVERY DAY   atorvastatin (LIPITOR) 20 MG tablet Take 1 tablet (20 mg total) by mouth daily.   blood glucose meter kit and supplies Two times daily testing (dispense meter based on insurance preference) dx e11.65   gabapentin (NEURONTIN) 300 MG capsule Take 1 capsule (300 mg total) by mouth at bedtime.   glucose blood test strip Use as instructed once daily dx E11.9   hydrALAZINE (APRESOLINE) 25 MG tablet Take 1 tablet (25 mg total) by mouth 3 (three) times daily.   insulin glargine, 2 Unit Dial, (TOUJEO MAX SOLOSTAR) 300 UNIT/ML Solostar Pen Inject 50 Units into the skin at bedtime. (Patient taking differently: Inject 50 Units into the skin every morning.)   Insulin Pen Needle 31G X 5 MM MISC To be used with Lantus insulin pen. Dispensed based on patient's insurance coverage. Please make sure these fit on the patients lantus pen   Insulin Pen Needle 31G X 5 MM MISC To be used with Lantus insulin pen. Dispensed based on patient's insurance coverage. Please make sure these fit on the patients lantus pen   latanoprost (XALATAN) 0.005 % ophthalmic solution SMARTSIG:1 Drop(s) In Eye(s) Every Evening   metoprolol tartrate (LOPRESSOR) 25 MG tablet Take 1 tablet (25 mg total) by mouth 2 (two) times daily.   montelukast (SINGULAIR) 10 MG tablet Take 1 tablet (10 mg total) by mouth at bedtime.   montelukast (SINGULAIR) 10 MG tablet Take 1 tablet (10 mg total) by mouth at bedtime.   nitroGLYCERIN (NITROSTAT) 0.4 MG SL tablet  DISSOLVE ONE TABLET UNDER THE TONGUE EVERY 5 MINUTES AS NEEDED FOR CHEST PAIN. DO NOT EXCEED A TOTAL OF 3 DOSES IN 15 MINUTES   pantoprazole (PROTONIX) 20 MG tablet Take 1 tablet (20 mg total) by mouth daily.   spironolactone (ALDACTONE) 25 MG tablet Take 1 tablet (25 mg total) by mouth daily.   spironolactone (ALDACTONE) 25 MG tablet Take 1 tablet (25 mg total) by mouth daily.   TRUEplus Lancets 33G MISC TEST  BLOOD SUGAR THREE TIMES DAILY   [DISCONTINUED] glipiZIDE (GLUCOTROL XL) 10 MG 24 hr tablet TAKE 2 TABLETS DAILY WITH BREAKFAST (Patient taking differently: daily with breakfast.)   No facility-administered encounter medications on file as of 02/03/2021.    ALLERGIES: Allergies  Allergen Reactions   Ace Inhibitors Cough   Latex Itching and Rash    VACCINATION STATUS: Immunization History  Administered Date(s) Administered   Fluad Quad(high Dose 65+) 12/04/2018, 12/18/2019, 12/22/2020   Influenza Whole 03/29/2006, 11/27/2009   Influenza,inj,Quad PF,6+ Mos 03/20/2013, 01/24/2014, 12/25/2014, 01/22/2016, 10/31/2016, 01/31/2017, 10/19/2017   Pneumococcal Conjugate-13 06/06/2014   Pneumococcal Polysaccharide-23 08/27/2004, 11/27/2009, 08/27/2015   Td 08/27/2004    Diabetes She presents for her follow-up diabetic visit. She has type 2 diabetes mellitus. Onset time: She was diagnosed at approximate age of 49. Her disease course has been stable. Pertinent negatives for hypoglycemia include no confusion, headaches, hunger, pallor or seizures. Associated symptoms include blurred vision, fatigue, polydipsia and polyuria. Pertinent negatives for diabetes include no chest pain and no polyphagia. There are no hypoglycemic complications. Symptoms are stable. Diabetic complications include nephropathy. Risk factors for coronary artery disease include dyslipidemia, diabetes mellitus, obesity, sedentary lifestyle, post-menopausal, family history and hypertension. Current diabetic treatment includes  insulin injections and oral agent (monotherapy). She is compliant with treatment most of the time. Her weight is fluctuating minimally. She is following a generally unhealthy diet. When asked about meal planning, she reported none. She has had a previous visit with a dietitian. She never participates in exercise. Her home blood glucose trend is fluctuating minimally. Her breakfast blood glucose range is generally >200 mg/dl. Her lunch blood glucose range is generally >200 mg/dl. Her dinner blood glucose range is generally >200 mg/dl. Her bedtime blood glucose range is generally >200 mg/dl. Her overall blood glucose range is >200 mg/dl. (She presents today with her logs and meter showing persistently high glycemic profile both fasting and postprandially.  Not much has changed since she was started back on insulin after going without for several months.  Analysis of her meter shows 7-day average of 343, 14-day average of 409, 30-day average of 393, 90-day average of 361.  She reports she has changed to using honey instead of sugar in her oatmeal and coffee, still gets hungry between meals.  She admits she has a very strong craving of sweets.) An ACE inhibitor/angiotensin II receptor blocker is not being taken. Eye exam is current.  Hypertension This is a chronic problem. The current episode started more than 1 year ago. The problem has been gradually improving since onset. The problem is controlled. Associated symptoms include blurred vision. Pertinent negatives include no chest pain or headaches. There are no associated agents to hypertension. Risk factors for coronary artery disease include diabetes mellitus, dyslipidemia, family history, obesity, post-menopausal state and sedentary lifestyle. Past treatments include diuretics, beta blockers, calcium channel blockers and direct vasodilators. The current treatment provides moderate improvement. Compliance problems include diet and exercise.  Hypertensive end-organ  damage includes kidney disease. Identifiable causes of hypertension include chronic renal disease.  Hyperlipidemia This is a chronic problem. The current episode started more than 1 year ago. The problem is controlled. Recent lipid tests were reviewed and are normal. Exacerbating diseases include chronic renal disease, diabetes and obesity. Factors aggravating her hyperlipidemia include fatty foods and beta blockers. Pertinent negatives include no chest pain. Current antihyperlipidemic treatment includes statins. The current treatment provides moderate improvement of lipids. Compliance problems include adherence to diet and adherence to exercise.  Risk factors for coronary artery disease include diabetes mellitus, dyslipidemia, family history, obesity, hypertension, post-menopausal and a sedentary lifestyle.   Review of systems  Constitutional: + Minimally fluctuating body weight,  current Body mass index is 34.56 kg/m. , + fatigue, no subjective hyperthermia, no subjective hypothermia Eyes: + blurry vision, no xerophthalmia ENT: no sore throat, no nodules palpated in throat, no dysphagia/odynophagia, no hoarseness Cardiovascular: no chest pain, no shortness of breath, no palpitations, no leg swelling Respiratory: no cough, no shortness of breath Gastrointestinal: no nausea/vomiting/diarrhea Musculoskeletal: no muscle/joint aches Skin: no rashes, no hyperemia Neurological: no tremors, no numbness, no tingling, no dizziness Psychiatric: no depression, no anxiety    Objective:    BP (!) 147/76   Pulse 89   Ht 5' 2.5" (1.588 m)   Wt 192 lb (87.1 kg)   BMI 34.56 kg/m   Wt Readings from Last 3 Encounters:  02/03/21 192 lb (87.1 kg)  01/20/21 193 lb 12.8 oz (87.9 kg)  12/22/20 192 lb 0.6 oz (87.1 kg)    BP Readings from Last 3 Encounters:  02/03/21 (!) 147/76  01/20/21 134/77  12/22/20 (!) 167/90    Physical Exam- Limited  Constitutional:  Body mass index is 34.56 kg/m. , not in  acute distress, normal state of mind Eyes:  EOMI, no exophthalmos Neck: Supple Cardiovascular: RRR, no murmurs, rubs, or gallops, no edema Respiratory: Adequate breathing efforts, no crackles, rales, rhonchi, or wheezing Musculoskeletal: no gross deformities, strength intact in all four extremities, no gross restriction of joint movements Skin:  no rashes, no hyperemia Neurological: no tremor with outstretched hands   ABI performed read error message- will try again at next visit.   CMP     Component Value Date/Time   NA 136 12/22/2020 1602   K 4.4 12/22/2020 1602   CL 94 (L) 12/22/2020 1602   CO2 27 12/22/2020 1602   GLUCOSE 370 (H) 12/22/2020 1602   GLUCOSE 178 (H) 05/21/2019 0929   BUN 11 12/22/2020 1602   CREATININE 0.80 12/22/2020 1602   CREATININE 0.73 05/21/2019 0929   CALCIUM 10.2 12/22/2020 1602   PROT 7.1 08/13/2020 1614   ALBUMIN 4.1 08/13/2020 1614   AST 28 08/13/2020 1614   ALT 25 08/13/2020 1614   ALKPHOS 127 (H) 08/13/2020 1614   BILITOT 0.3 08/13/2020 1614   GFRNONAA 76 12/12/2019 1050   GFRNONAA 82 05/21/2019 0929   GFRAA 88 12/12/2019 1050   GFRAA 95 05/21/2019 0929     Diabetic Labs (most recent): Lab Results  Component Value Date   HGBA1C 14.6 (H) 12/22/2020   HGBA1C 13.2 (A) 08/13/2020   HGBA1C 9.9 (A) 05/11/2020     Lipid Panel ( most recent) Lipid Panel     Component Value Date/Time   CHOL 172 08/13/2020 1614   TRIG 151 (H) 08/13/2020 1614   HDL 51 08/13/2020 1614   CHOLHDL 3.4 08/13/2020 1614   CHOLHDL 3.0 05/21/2019 0929   VLDL 20 07/22/2016 1004   LDLCALC 95 08/13/2020 1614   LDLCALC 107 (H) 05/21/2019 0929      Lab Results  Component Value Date   TSH 1.730 08/13/2020   TSH 2.58 05/21/2019   TSH 1.71 01/31/2017   TSH 2.75 05/26/2015   TSH 3.261 06/02/2014   TSH 3.039 05/07/2013   TSH 3.308 10/10/2011   TSH 2.811 03/19/2010   TSH 3.422 08/12/2009   FREET4 0.9 05/21/2019       Assessment & Plan:   1) Type 2  diabetes mellitus  with vascular disease ([place])  - Tiffany Jacobs has currently uncontrolled symptomatic type 2 DM since  75 years of age.  She presents today with her logs and meter showing persistently high glycemic profile both fasting and postprandially.  Not much has changed since she was started back on insulin after going without for several months.  Analysis of her meter shows 7-day average of 343, 14-day average of 409, 30-day average of 393, 90-day average of 361.  She reports she has changed to using honey instead of sugar in her oatmeal and coffee, still gets hungry between meals.  She admits she has a very strong craving of sweets.  She denies any hypoglycemia.  Recent labs reviewed.  -her diabetes is complicated by obesity/sedentary life and she remains at a high risk for more acute and chronic complications which include CAD, CVA, CKD, retinopathy, and neuropathy. These are all discussed in detail with her.  - Nutritional counseling repeated at each appointment due to patients tendency to fall back in to old habits.  - The patient admits there is a room for improvement in their diet and drink choices. -  Suggestion is made for the patient to avoid simple carbohydrates from their diet including Cakes, Sweet Desserts / Pastries, Ice Cream, Soda (diet and regular), Sweet Tea, Candies, Chips, Cookies, Sweet Pastries, Store Bought Juices, Alcohol in Excess of 1-2 drinks a day, Artificial Sweeteners, Coffee Creamer, and "Sugar-free" Products. This will help patient to have stable blood glucose profile and potentially avoid unintended weight gain.   - I encouraged the patient to switch to unprocessed or minimally processed complex starch and increased protein intake (animal or plant source), fruits, and vegetables.   - Patient is advised to stick to a routine mealtimes to eat 3 meals a day and avoid unnecessary snacks (to snack only to correct hypoglycemia).  - she is following with  Jearld Fenton, RDN, CDE for individualized diabetes education.  - I have approached her with the following individualized plan to manage diabetes and patient agrees:   -She is advised to continue Toujeo 50 units SQ nightly (had been taking it in the morning) and continue her Glipizide 10 mg XL daily with breakfast.  She admits she struggles with the ability to self-inject but says she is pushing through.  She may need prandial insulin in addition to her current regimen but she prefers to try other alternatives first due to her fear of needles.  I discussed and initiated trial of Ozempic 0.25 mg SQ weekly x 2 weeks, then to advance to 0.5 mg SQ weekly thereafter if tolerated well.   -She is encouraged to continue monitoring blood glucose twice daily, before breakfast and before bed, and to call the clinic if she has readings less than 70 or above 300 for 3 tests in a row.   - she is warned not to take insulin without proper monitoring per orders.  - Patient specific target  A1c;  LDL, HDL, Triglycerides, and  Waist Circumference were discussed in detail.  2) Blood Pressure /Hypertension:  Her blood pressure is controlled to target for her age.  she is advised to continue her current medications including Amlodipine 10 mg po daily, Spironolactone 25 mg po daily, Hydralazine 25 mg po daily, and Metoprolol 25 mg po daily.  3) Lipids/Hyperlipidemia:    Her most recent lipid panel from 08/13/20 shows controlled LDL of 95 and elevated triglycerides of 151.   she is advised to continue Atorvastatin 20 mg  p.o. daily at bedtime.  Side effects and precautions discussed with her.  4)  Weight/Diet:  Her Body mass index is 34.56 kg/m.-   clearly complicating her diabetes care.  I discussed with her the fact that loss of 5 - 10% of her  current body weight will have the most impact on her diabetes management.  CDE Consult will be initiated . Exercise, and detailed carbohydrates information provided  -  detailed  on discharge instructions.  5) Chronic Care/Health Maintenance: -she is on Statin medications and  is encouraged to initiate and continue to follow up with Ophthalmology, Dentist,  Podiatrist at least yearly or according to recommendations, and advised to stay away from smoking. I have recommended yearly flu vaccine and pneumonia vaccine at least every 5 years; moderate intensity exercise for up to 150 minutes weekly; and  sleep for at least 7 hours a day.  - she is advised to maintain close follow up with Fayrene Helper, MD for primary care needs, as well as her other providers for optimal and coordinated care.      I spent 40 minutes in the care of the patient today including review of labs from [facility], Lipids, Thyroid Function, Hematology (current and previous including abstractions from other facilities); face-to-face time discussing  her blood glucose readings/logs, discussing hypoglycemia and hyperglycemia episodes and symptoms, medications doses, her options of short and long term treatment based on the latest standards of care / guidelines;  discussion about incorporating lifestyle medicine;  and documenting the encounter.    Please refer to Patient Instructions for Blood Glucose Monitoring and Insulin/Medications Dosing Guide"  in media tab for additional information. Please  also refer to " Patient Self Inventory" in the Media  tab for reviewed elements of pertinent patient history.  Tiffany Jacobs participated in the discussions, expressed understanding, and voiced agreement with the above plans.  All questions were answered to her satisfaction. she is encouraged to contact clinic should she have any questions or concerns prior to her return visit.   Follow up plan: - Return in about 1 month (around 03/05/2021) for Diabetes F/U, Bring meter and logs.  Rayetta Pigg, [facility] [address] [place], [place] [address] Phone:  [phone] Fax: [phone]  02/03/2021, 2:52 PM

## 2021-02-19 ENCOUNTER — Ambulatory Visit: Payer: Medicare HMO | Admitting: Family Medicine

## 2021-02-19 DIAGNOSIS — H401121 Primary open-angle glaucoma, left eye, mild stage: Secondary | ICD-10-CM | POA: Diagnosis not present

## 2021-02-19 DIAGNOSIS — H401112 Primary open-angle glaucoma, right eye, moderate stage: Secondary | ICD-10-CM | POA: Diagnosis not present

## 2021-02-26 ENCOUNTER — Encounter: Payer: Self-pay | Admitting: Family Medicine

## 2021-02-26 ENCOUNTER — Ambulatory Visit (INDEPENDENT_AMBULATORY_CARE_PROVIDER_SITE_OTHER): Payer: Medicare HMO | Admitting: Family Medicine

## 2021-02-26 ENCOUNTER — Other Ambulatory Visit: Payer: Self-pay

## 2021-02-26 VITALS — BP 138/78 | HR 82 | Resp 15 | Ht 63.0 in | Wt 192.0 lb

## 2021-02-26 DIAGNOSIS — I10 Essential (primary) hypertension: Secondary | ICD-10-CM | POA: Diagnosis not present

## 2021-02-26 DIAGNOSIS — E782 Mixed hyperlipidemia: Secondary | ICD-10-CM

## 2021-02-26 DIAGNOSIS — E1159 Type 2 diabetes mellitus with other circulatory complications: Secondary | ICD-10-CM

## 2021-02-26 MED ORDER — AMLODIPINE-ATORVASTATIN 10-20 MG PO TABS: 1.0000 | ORAL_TABLET | Freq: Every day | ORAL | 1 refills | Status: DC

## 2021-02-26 NOTE — Patient Instructions (Signed)
F/U in mid April, call if you need me sooner  Medication changed to caduet 10/20 one daily in place of amlodipine 10 mg and atorvastatin 20 mg   Fasting lipid, cmp and EGFr mid January   Good  bloood pressure  Good that blood sugar is improving  Thanks for choosing [facility], we consider it a privelige to serve you.

## 2021-03-02 ENCOUNTER — Encounter: Payer: Self-pay | Admitting: Family Medicine

## 2021-03-02 NOTE — Progress Notes (Signed)
Tiffany Jacobs     MRN: [id number]      DOB: 1945/05/22   HPI Tiffany Jacobs is here for follow up and re-evaluation of chronic medical conditions, medication management and review of any available recent lab and radiology data.  Preventive health is updated, specifically  Cancer screening and Immunization.   Questions or concerns regarding consultations or procedures which the PT has had in the interim are  addressed. The PT denies any adverse reactions to current medications since the last visit.  There are no new concerns.  There are no specific complaints   ROS Denies recent fever or chills. Denies sinus pressure, nasal congestion, ear pain or sore throat. Denies chest congestion, productive cough or wheezing. Denies chest pains, palpitations and leg swelling Denies abdominal pain, nausea, vomiting,diarrhea or constipation.   Denies dysuria, frequency, hesitancy or incontinence. Chronic  joint pain, swelling and limitation in mobility. Denies headaches, seizures, numbness, or tingling. Denies depression, anxiety or insomnia. Denies skin break down or rash.   PE  BP 138/78    Pulse 82    Resp 15    Ht 5\' 3"  (1.6 m)    Wt 192 lb (87.1 kg)    SpO2 95%    BMI 34.01 kg/m   Patient alert and oriented and in no cardiopulmonary distress.  HEENT: No facial asymmetry, EOMI,     Neck supple .  Chest: Clear to auscultation bilaterally.  CVS: S1, S2 no murmurs, no S3.Regular rate.  ABD: Soft non tender.   Ext: No edema  MS: decreased ROM spine, shoulders, hips and knees.  Skin: Intact, no ulcerations or rash noted.  Psych: Good eye contact, normal affect. Memory intact not anxious or depressed appearing.  CNS: CN 2-12 intact, power,  normal throughout.no focal deficits noted.   Assessment & Plan  Primary hypertension DASH diet and commitment to daily physical activity for a minimum of 30 minutes discussed and encouraged, as a part of hypertension management. The  importance of attaining a healthy weight is also discussed.  BP/Weight 02/26/2021 02/03/2021 01/20/2021 12/22/2020 08/13/2020 04/10/5571 04/08/252  Systolic BP 270 623 762 831 517 616 073  Diastolic BP 78 76 77 90 91 78 76  Wt. (Lbs) 192 192 193.8 192.04 193 197.12 201.4  BMI 34.01 34.56 34.88 34.56 34.74 35.48 35.68    Controlled, no change in medication    Type 2 diabetes mellitus with vascular disease ([place]) uncontrolled managed by Endo Tiffany Jacobs is reminded of the importance of commitment to daily physical activity for 30 minutes or more, as able and the need to limit carbohydrate intake to 30 to 60 grams per meal to help with blood sugar control.   The need to take medication as prescribed, test blood sugar as directed, and to call between visits if there is a concern that blood sugar is uncontrolled is also discussed.   Tiffany Jacobs is reminded of the importance of daily foot exam, annual eye examination, and good blood sugar, blood pressure and cholesterol control.  Diabetic Labs Latest Ref Rng & Units 12/22/2020 08/13/2020 05/11/2020 12/12/2019 10/03/2019  HbA1c 4.8 - 5.6 % 14.6(H) 13.2(A) 9.9(A) - 11.6(A)  Microalbumin mg/dL - - - - -  Micro/Creat Ratio <30 mcg/mg creat - - - - -  Chol 100 - 199 mg/dL - 172 - 215(H) -  HDL >39 mg/dL - 51 - 54 -  Calc LDL 0 - 99 mg/dL - 95 - 135(H) -  Triglycerides 0 - 149 mg/dL -  151(H) - 145 -  Creatinine 0.57 - 1.00 mg/dL 0.80 0.95 - 0.77 -   BP/Weight 02/26/2021 02/03/2021 01/20/2021 12/22/2020 08/13/2020 09/12/381 05/08/8327  Systolic BP 191 660 600 459 977 414 239  Diastolic BP 78 76 77 90 91 78 76  Wt. (Lbs) 192 192 193.8 192.04 193 197.12 201.4  BMI 34.01 34.56 34.88 34.56 34.74 35.48 35.68   Foot/eye exam completion dates Latest Ref Rng & Units 12/04/2018 02/14/2018  Eye Exam No Retinopathy - -  Foot exam Order - - -  Foot Form Completion - Done Done        Morbid obesity ([place])  Patient re-educated about  the importance of  commitment to a  minimum of 150 minutes of exercise per week as able.  The importance of healthy food choices with portion control discussed, as well as eating regularly and within a 12 hour window most days. The need to choose "clean , green" food 50 to 75% of the time is discussed, as well as to make water the primary drink and set a goal of 64 ounces water daily.    Weight /BMI 02/26/2021 02/03/2021 01/20/2021  WEIGHT 192 lb 192 lb 193 lb 12.8 oz  HEIGHT 5\' 3"  5' 2.5" 5' 2.5"  BMI 34.01 kg/m2 34.56 kg/m2 34.88 kg/m2

## 2021-03-02 NOTE — Assessment & Plan Note (Signed)
uncontrolled managed by Endo Ms. Strieter is reminded of the importance of commitment to daily physical activity for 30 minutes or more, as able and the need to limit carbohydrate intake to 30 to 60 grams per meal to help with blood sugar control.   The need to take medication as prescribed, test blood sugar as directed, and to call between visits if there is a concern that blood sugar is uncontrolled is also discussed.   Ms. Suits is reminded of the importance of daily foot exam, annual eye examination, and good blood sugar, blood pressure and cholesterol control.  Diabetic Labs Latest Ref Rng & Units 12/22/2020 08/13/2020 05/11/2020 12/12/2019 10/03/2019  HbA1c 4.8 - 5.6 % 14.6(H) 13.2(A) 9.9(A) - 11.6(A)  Microalbumin mg/dL - - - - -  Micro/Creat Ratio <30 mcg/mg creat - - - - -  Chol 100 - 199 mg/dL - 172 - 215(H) -  HDL >39 mg/dL - 51 - 54 -  Calc LDL 0 - 99 mg/dL - 95 - 135(H) -  Triglycerides 0 - 149 mg/dL - 151(H) - 145 -  Creatinine 0.57 - 1.00 mg/dL 0.80 0.95 - 0.77 -   BP/Weight 02/26/2021 02/03/2021 01/20/2021 12/22/2020 08/13/2020 10/08/3823 0/07/3974  Systolic BP 734 193 790 240 973 532 992  Diastolic BP 78 76 77 90 91 78 76  Wt. (Lbs) 192 192 193.8 192.04 193 197.12 201.4  BMI 34.01 34.56 34.88 34.56 34.74 35.48 35.68   Foot/eye exam completion dates Latest Ref Rng & Units 12/04/2018 02/14/2018  Eye Exam No Retinopathy - -  Foot exam Order - - -  Foot Form Completion - Done Done

## 2021-03-02 NOTE — Assessment & Plan Note (Signed)
°  Patient re-educated about  the importance of commitment to a  minimum of 150 minutes of exercise per week as able.  The importance of healthy food choices with portion control discussed, as well as eating regularly and within a 12 hour window most days. The need to choose "clean , green" food 50 to 75% of the time is discussed, as well as to make water the primary drink and set a goal of 64 ounces water daily.    Weight /BMI 02/26/2021 02/03/2021 01/20/2021  WEIGHT 192 lb 192 lb 193 lb 12.8 oz  HEIGHT 5\' 3"  5' 2.5" 5' 2.5"  BMI 34.01 kg/m2 34.56 kg/m2 34.88 kg/m2

## 2021-03-02 NOTE — Assessment & Plan Note (Signed)
DASH diet and commitment to daily physical activity for a minimum of 30 minutes discussed and encouraged, as a part of hypertension management. The importance of attaining a healthy weight is also discussed.  BP/Weight 02/26/2021 02/03/2021 01/20/2021 12/22/2020 08/13/2020 1/0/3013 03/10/3886  Systolic BP 757 972 820 601 561 537 943  Diastolic BP 78 76 77 90 91 78 76  Wt. (Lbs) 192 192 193.8 192.04 193 197.12 201.4  BMI 34.01 34.56 34.88 34.56 34.74 35.48 35.68    Controlled, no change in medication

## 2021-03-03 ENCOUNTER — Telehealth: Payer: Self-pay | Admitting: *Deleted

## 2021-03-03 NOTE — Chronic Care Management (AMB) (Signed)
Chronic Care Management   Note  03/03/2021 Name: TIANDRA SWOVELAND MRN: [id number] DOB: [birth year]  Lorine Bears Guevarra is a 75 y.o. year old female who is a primary care patient of Moshe Cipro Norwood Levo, MD. I reached out to [name] by phone today in response to a referral sent by Ms. Loyal L Mabie's PCP.  Ms. Tavares was given information about Chronic Care Management services today including:  CCM service includes personalized support from designated clinical staff supervised by her physician, including individualized plan of care and coordination with other care providers 24/7 contact phone numbers for assistance for urgent and routine care needs. Service will only be billed when office clinical staff spend 20 minutes or more in a month to coordinate care. Only one practitioner may furnish and bill the service in a calendar month. The patient may stop CCM services at any time (effective at the end of the month) by phone call to the office staff. The patient is responsible for co-pay (up to 20% after annual deductible is met) if co-pay is required by the individual health plan.   Patient agreed to services and verbal consent obtained.   Follow up plan: Telephone appointment with care management team member scheduled for:03/18/21  [facility]  Direct Dial: [phone]

## 2021-03-09 ENCOUNTER — Ambulatory Visit: Payer: Medicare HMO | Admitting: Nurse Practitioner

## 2021-03-09 NOTE — Patient Instructions (Incomplete)

## 2021-03-18 ENCOUNTER — Ambulatory Visit (INDEPENDENT_AMBULATORY_CARE_PROVIDER_SITE_OTHER): Payer: Medicare Other | Admitting: Pharmacist

## 2021-03-18 DIAGNOSIS — E782 Mixed hyperlipidemia: Secondary | ICD-10-CM

## 2021-03-18 DIAGNOSIS — E1159 Type 2 diabetes mellitus with other circulatory complications: Secondary | ICD-10-CM

## 2021-03-18 DIAGNOSIS — I10 Essential (primary) hypertension: Secondary | ICD-10-CM

## 2021-03-18 NOTE — Chronic Care Management (AMB) (Signed)
° ° °Chronic Care Management °Pharmacy Note ° °03/18/2021 °Name:  Teneisha L Chismar MRN:  [id number] DOB:  06/28/1945 ° °Summary: °Type 2 Diabetes °Uncontrolled; Most recent A1c above goal of <7% per ADA guidelines °Current medications: semaglutide (Ozempic) 0.25 mg subcutaneously weekly, glipizide XL 10 mg by mouth with breakfast, and insulin glargine (Toujeo) 50 units subcutaneously once daily °Taking medications as directed: no, patient reports she has been forgetting to take her Ozempic once weekly °Current meal patterns:  patient reports she is reducing intake of sugar and bread. Is trying to eat more fruits/vegetables °Current glucose readings:  Patient verbally reports blood glucose has improved to mostly 200s now instead of 300s °Continue current medications as above per endocrinology °Patient identified as a good candidate for continuous glucose monitor. Discussed with patient at request of primary care provider. Patient currently has [employer] Medicare Advantage plan that would cover Freestyle Libre 2 at the patient's pharmacy; however, patient reports she is planning to switch back to [employer] Medicare effective 04/07/21. She will not meet the requirements for coverage according to [employer]. Therefore, she will have to continue checking finger stick blood glucose as instructed. °Continue follow-up with endocrinology °Discussed need for medication compliance. Patient instructed to set reminder to remember to take weekly dose of Ozempic. Patient reminded to increase dose to 0.5 mg subcutaneously weekly after 2 doses of 0.25 mg subcutaneously weekly as long as she is tolerating well. ° °Hypertension °Continue current medications for now; however, may be reasonable to consider angiotensin receptor blocker or thiazide if additional blood pressure lowering required.  ° °Hyperlipidemia °Uncontrolled. LDL above goal of <70 due to very high risk given 10-year risk >20% per 2020 AACE/ACE guidelines. Triglycerides  above goal of <150 per 2020 AACE/ACE guidelines. °Continue atorvastatin 20 mg by mouth once daily for now; however, given elevated cardiovascular disease risk, may be reasonable to increase to high intensity statin.  °Re-check lipid panel a few days before next primary care provider visit. If LDL remains above goal then would recommend addition of ezetimibe 10 mg by mouth daily.  ° °Subjective: °Yameli L Sabet is an 76 y.o. year old female who is a primary patient of Simpson, Margaret E, MD.  The CCM team was consulted for assistance with disease management and care coordination needs.   ° °Engaged with patient by telephone for initial visit in response to provider referral for pharmacy case management and/or care coordination services.  ° °Consent to Services:  °The patient was given the following information about Chronic Care Management services today, agreed to services, and gave verbal consent: 1. CCM service includes personalized support from designated clinical staff supervised by the primary care provider, including individualized plan of care and coordination with other care providers 2. 24/7 contact phone numbers for assistance for urgent and routine care needs. 3. Service will only be billed when office clinical staff spend 20 minutes or more in a month to coordinate care. 4. Only one practitioner may furnish and bill the service in a calendar month. 5.The patient may stop CCM services at any time (effective at the end of the month) by phone call to the office staff. 6. The patient will be responsible for cost sharing (co-pay) of up to 20% of the service fee (after annual deductible is met). Patient agreed to services and consent obtained. ° °Patient Care Team: °Simpson, Margaret E, MD as PCP - General °Koneswaran, Suresh A, MD (Inactive) as PCP - Cardiology (Cardiology) °Harrison, Stanley E, MD as Consulting Physician (  Physician (Orthopedic Surgery) Leta Baptist, MD as Consulting Physician  (Otolaryngology) Eloise Harman, DO as Consulting Physician (Internal Medicine) Beryle Lathe, [facility] (Pharmacist)  Objective:  Lab Results  Component Value Date   CREATININE 0.80 12/22/2020   CREATININE 0.95 08/13/2020   CREATININE 0.77 12/12/2019    Lab Results  Component Value Date   HGBA1C 14.6 (H) 12/22/2020   Last diabetic Eye exam:  Lab Results  Component Value Date/Time   HMDIABEYEEXA Retinopathy (A) 02/01/2017 12:00 AM    Last diabetic Foot exam:  Lab Results  Component Value Date/Time   HMDIABFOOTEX normal 02/17/2012 12:00 AM        Component Value Date/Time   CHOL 172 08/13/2020 1614   TRIG 151 (H) 08/13/2020 1614   HDL 51 08/13/2020 1614   CHOLHDL 3.4 08/13/2020 1614   CHOLHDL 3.0 05/21/2019 0929   VLDL 20 07/22/2016 1004   LDLCALC 95 08/13/2020 1614   LDLCALC 107 (H) 05/21/2019 0929    Hepatic Function Latest Ref Rng & Units 08/13/2020 12/12/2019 05/21/2019  Total Protein 6.0 - 8.5 g/dL 7.1 7.4 6.8  Albumin 3.7 - 4.7 g/dL 4.1 4.5 -  AST 0 - 40 IU/L _0 ALT 0 - 32 IU/L _1 Alk Phosphatase 44 - 121 IU/L 127(H) 113 -  Total Bilirubin 0.0 - 1.2 mg/dL 0.3 0.4 0.3  Bilirubin, Direct <=0.2 mg/dL - - -    Lab Results  Component Value Date/Time   TSH 1.730 08/13/2020 04:14 PM   TSH 2.58 05/21/2019 09:29 AM   FREET4 0.9 05/21/2019 09:29 AM    CBC Latest Ref Rng & Units 08/13/2020 12/12/2019 08/21/2017  WBC 3.4 - 10.8 x10E3/uL 7.7 8.3 6.9  Hemoglobin 11.1 - 15.9 g/dL 13.2 13.3 13.3  Hematocrit 34.0 - 46.6 % 40.3 41.2 40.3  Platelets 150 - 450 x10E3/uL 269 279 297    Lab Results  Component Value Date/Time   VD25OH 34.1 08/13/2020 04:14 PM   VD25OH 28.3 (L) 12/12/2019 10:50 AM    Clinical ASCVD: No  The 10-year ASCVD risk score (Arnett DK, et al., 2019) is: 31.3%   Values used to calculate the score:     Age: 14 years     Sex: Female     Is Non-Hispanic African American: Yes     Diabetic: Yes     Tobacco smoker: No      Systolic Blood Pressure: 256 mmHg     Is BP treated: Yes     HDL Cholesterol: 51 mg/dL     Total Cholesterol: 172 mg/dL    Social History   Tobacco Use  Smoking Status Former   Packs/day: 0.50   Years: 37.00   Pack years: 18.50   Types: Cigarettes   Quit date: 10/12/1988   Years since quitting: 32.4  Smokeless Tobacco Never   BP Readings from Last 3 Encounters:  02/26/21 138/78  02/03/21 (!) 147/76  01/20/21 134/77   Pulse Readings from Last 3 Encounters:  02/26/21 82  02/03/21 89  01/20/21 94   Wt Readings from Last 3 Encounters:  02/26/21 192 lb (87.1 kg)  02/03/21 192 lb (87.1 kg)  01/20/21 193 lb 12.8 oz (87.9 kg)    Assessment: Review of patient past medical history, allergies, medications, health status, including review of consultants reports, laboratory and other test data, was performed as part of comprehensive evaluation and provision of chronic care management services.   SDOH:  (Social Determinants of Health) assessments and interventions performed:  CCM Care Plan  Allergies  Allergen Reactions   Ace Inhibitors Cough   Latex Itching and Rash    Medications Reviewed Today     Reviewed by Beryle Lathe, [facility] (Pharmacist) on 03/18/21 at [place] List Status: <None>   Medication Order Taking? Sig Documenting Provider Last Dose Status Informant  albuterol (PROVENTIL) (2.5 MG/3ML) 0.083% nebulizer solution [id number] Yes Take 2.5 mg by nebulization every 6 (six) hours as needed for wheezing or shortness of breath.  [provider] Taking Active Self           Med Note Jacklynn Ganong, MINDY S   Wed Mar 11, 2020  3:27 PM)    albuterol (VENTOLIN HFA) 108 (90 Base) MCG/ACT inhaler [id number] Yes Inhale 2 puffs into the lungs every 6 (six) hours as needed for wheezing or shortness of breath. Fayrene Helper, MD Taking Active            Med Note Jacklynn Ganong, [place] S   Wed Mar 11, 2020  3:27 PM)    Alcohol Swabs (B-D SINGLE USE SWABS REGULAR) PADS  [id number]  Use to test as directed Fayrene Helper, MD  Active   amlodipine-atorvastatin (CADUET) 10-20 MG tablet [id number] No Take 1 tablet by mouth daily.  Patient not taking: Reported on 03/18/2021   Fayrene Helper, MD Not Taking Active   blood glucose meter kit and supplies [id number]  Two times daily testing (dispense meter based on insurance preference) dx e11.65 Fayrene Helper, MD  Active   gabapentin (NEURONTIN) 300 MG capsule [id number] Yes Take 1 capsule (300 mg total) by mouth at bedtime. Lindell Spar, MD Taking Active   glipiZIDE (GLUCOTROL XL) 10 MG 24 hr tablet [id number] Yes Take 10 mg by mouth daily with breakfast. [provider] Taking Active   glucose blood test strip [id number]  Use as instructed once daily dx E11.9 Fayrene Helper, MD  Active   hydrALAZINE (APRESOLINE) 25 MG tablet [id number] Yes Take 1 tablet (25 mg total) by mouth 3 (three) times daily. Fayrene Helper, MD Taking Active   insulin glargine, 2 Unit Dial, (TOUJEO MAX SOLOSTAR) 300 UNIT/ML Solostar Pen [id number] Yes Inject 50 Units into the skin at bedtime.  Patient taking differently: Inject 50 Units into the skin every morning.   Brita Romp, NP Taking Active   Insulin Pen Needle 31G X 5 MM MISC [id number] Yes To be used with Lantus insulin pen. Dispensed based on patient's insurance coverage. Please make sure these fit on the patients lantus pen Fayrene Helper, MD Taking Active   latanoprost (XALATAN) 0.005 % ophthalmic solution [id number] Yes SMARTSIG:1 Drop(s) In Eye(s) Every Evening [provider] Taking Active   metoprolol tartrate (LOPRESSOR) 25 MG tablet [id number] Yes Take 1 tablet (25 mg total) by mouth 2 (two) times daily. Fayrene Helper, MD Taking Active   montelukast (SINGULAIR) 10 MG tablet [id number] Yes Take 1 tablet (10 mg total) by mouth at bedtime. Fayrene Helper, MD Taking Active   nitroGLYCERIN (NITROSTAT) 0.4 MG SL tablet [id number] No  DISSOLVE ONE TABLET UNDER THE TONGUE EVERY 5 MINUTES AS NEEDED FOR CHEST PAIN. DO NOT EXCEED A TOTAL OF 3 DOSES IN 15 MINUTES [provider] Unknown Active   pantoprazole (PROTONIX) 20 MG tablet [id number] No Take 1 tablet (20 mg total) by mouth daily. Lindell Spar, MD Unknown Active            Med Note [facility], SHERRY D  Tue Dec 22, 2020  3:03 PM) Needs refill  Semaglutide,0.25 or 0.5MG/DOS, (OZEMPIC, 0.25 OR 0.5 MG/DOSE,) 2 MG/1.5ML SOPN [id number] Yes Inject 0.5 mg into the skin once a week.  Patient taking differently: Inject 0.25 mg into the skin once a week.   Brita Romp, NP Taking Active            Med Note Jim Like Mar 18, 2021  4:04 PM) Patient forgets to take a lot   spironolactone (ALDACTONE) 25 MG tablet [id number] Yes Take 1 tablet (25 mg total) by mouth daily. Fayrene Helper, MD Taking Active   TRUEplus Lancets 33G MISC [id number]  TEST BLOOD SUGAR THREE TIMES DAILY Fayrene Helper, MD  Active   Med List Note Bo Mcclintock, [place] 08/30/18 1515): Tram            Patient Active Problem List   Diagnosis Date Noted   Anxiety 05/11/2020   History of colonic polyps 03/11/2020   Chronic cough 12/18/2019   Urinary frequency 10/05/2019   Tubular adenoma of colon 12/04/2018   Nocturnal hypoxia 07/03/2014   Allergic rhinitis 03/20/2013   Mixed hyperlipidemia 11/20/2010   COUGH VARIANT ASTHMA 11/27/2009   Morbid obesity ([place]) 06/21/2009   Gastroesophageal reflux disease 06/21/2009   Primary hypertension 03/08/2009   SHOULDER, ARTHRITIS, DEGEN./OSTEO 08/28/2007   SCIATICA 06/28/2007   Type 2 diabetes mellitus with vascular disease ([place]) 11/28/2006    Immunization History  Administered Date(s) Administered   Fluad Quad(high Dose 65+) 12/04/2018, 12/18/2019, 12/22/2020   Influenza Whole 03/29/2006, 11/27/2009   Influenza,inj,Quad PF,6+ Mos 03/20/2013, 01/24/2014, 12/25/2014, 01/22/2016, 10/31/2016, 01/31/2017, 10/19/2017    Pneumococcal Conjugate-13 06/06/2014   Pneumococcal Polysaccharide-23 08/27/2004, 11/27/2009, 08/27/2015   Td 08/27/2004    Conditions to be addressed/monitored: HTN, HLD, and DMII  Care Plan : Medication Management  Updates made by Beryle Lathe, [place] since 03/18/2021 12:00 AM     Problem: T2DM, HTN, HLD   Priority: High  Onset Date: 03/18/2021     Long-Range Goal: Disease Progression Prevention   Start Date: 03/18/2021  Expected End Date: 06/16/2021  This Visit's Progress: On track  Priority: High  Note:   Current Barriers:  Unable to independently monitor therapeutic efficacy Unable to achieve control of diabetes and hyperlipidemia  Pharmacist Clinical Goal(s):  Through collaboration with PharmD and provider, patient will  Achieve adherence to monitoring guidelines and medication adherence to achieve therapeutic efficacy Achieve control of diabetes and hyperlipidemia as evidenced by improved fasting blood sugar, improved A1c, and improved LDL   Interventions: 1:1 collaboration with Fayrene Helper, MD regarding development and update of comprehensive plan of care as evidenced by provider attestation and co-signature Inter-disciplinary care team collaboration (see longitudinal plan of care) Comprehensive medication review performed; medication list updated in electronic medical record  Type 2 Diabetes - New goal.: Uncontrolled; Most recent A1c above goal of <7% per ADA guidelines Current medications: semaglutide (Ozempic) 0.25 mg subcutaneously weekly, glipizide XL 10 mg by mouth with breakfast, and insulin glargine (Toujeo) 50 units subcutaneously once daily Intolerances: none Taking medications as directed: no, patient reports she has been forgetting to take her Ozempic once weekly Side effects thought to be attributed to current medication regimen: no Denies hypoglycemic symptoms (sweaty and shaky). Hypoglycemia prevention: not indicated at this time Current  meal patterns:  patient reports she is reducing intake of sugar and bread. Is trying to eat more fruits/vegetables Current exercise: not discussed today On a statin: yes  On aspirin 81 mg daily: no °Last microalbumin/creatinine ratio: 5 (05/21/19); on an ACEi/ARB: no °Last eye exam: overdue °Last foot exam: completed within last year °Current glucose readings:  Patient verbally reports blood glucose has improved to mostly 200s now instead of 300s °Continue current medications as above per endocrinology °Encouraged regular aerobic exercise with a goal of 30 minutes five times per week (150 minutes per week) °Patient identified as a good candidate for continuous glucose monitor. Discussed with patient at request of primary care provider. Patient currently has [employer] Medicare Advantage plan that would cover Freestyle Libre 2 at the patient's pharmacy; however, patient reports she is planning to switch back to [employer] Medicare effective 04/07/21. She will not meet the requirements for coverage according to [employer]. Therefore, she will have to continue checking finger stick blood glucose as instructed. °Continue follow-up with endocrinology °Discussed need for medication compliance. Patient instructed to set reminder to remember to take weekly dose of Ozempic. Patient reminded to increase dose to 0.5 mg subcutaneously weekly after 2 doses of 0.25 mg subcutaneously weekly as long as she is tolerating well. ° °Hypertension - New goal.: °Blood pressure under good control. Blood pressure is at goal of <130/80 mmHg per 2017 AHA/ACC guidelines. °Current medications: amlodipine 10 mg by mouth once daily, spironolactone 25 mg by mouth once daily, metoprolol tartrate 25 mg by mouth twice daily, and hydralazine 25 mg by mouth three times daily °Intolerances:  ACE inhibitor (cough) °Taking medications as directed: yes °Side effects thought to be attributed to current medication regimen: no °Current home blood pressure: not  discussed today °Continue current medications as above for now; however, may be reasonable to consider angiotensin receptor blocker or thiazide if additional blood pressure lowering required.  °Encourage dietary sodium restriction/DASH diet °Recommend home blood pressure monitoring to discuss at next visit °Discussed need for and importance of continued work on weight loss °Discussed need for medication compliance ° °Hyperlipidemia - New goal.: °Uncontrolled. LDL above goal of <70 due to very high risk given 10-year risk >20% per 2020 AACE/ACE guidelines. Triglycerides above goal of <150 per 2020 AACE/ACE guidelines. °Current medications: atorvastatin 20 mg by mouth once daily °Intolerances: none °Taking medications as directed: yes °Side effects thought to be attributed to current medication regimen: no °Continue atorvastatin 20 mg by mouth once daily for now; however, given elevated cardiovascular disease risk, may be reasonable to increase to high intensity statin.  °Re-check lipid panel a few days before next primary care provider visit. If LDL remains above goal then would recommend addition of ezetimibe 10 mg by mouth daily.  °Encourage dietary reduction of high fat containing foods such as butter, nuts, bacon, egg yolks, etc. °Discussed need for and importance of continued work on weight loss °Discussed need for medication compliance ° °Patient Goals/Self-Care Activities °Patient will:  °Focus on medication adherence by keeping up with prescription refills and either using a pill box or reminders to take your medications at the prescribed times °Check blood sugar as instructed by endocrinology, document, and provide at future appointments °Engage in dietary modifications by fewer sweetened foods & beverages ° °Follow Up Plan: Face to Face appointment with care management team member scheduled for: 04/07/21 °  ° ° °Medication Assistance: None required.  Patient affirms current coverage meets needs. ° °Patient's  preferred pharmacy is: ° °[facility] - [place], [place] - [address] °[address] °[place] [address] °Phone: [phone] Fax: [phone] ° °[facility] - [place] - [address] °[address] °[place] [address] °Phone: [phone] Fax: [phone] ° °Follow Up:  Patient agrees to Care Plan and Follow-up. ° °Plan: Face to Face appointment with care management team member scheduled for: 04/07/21 ° °Christopher Walston, PharmD, BCACP, CPP °Clinical Pharmacist Practitioner °[facility] °[phone] ° ° ° ° ° °

## 2021-03-18 NOTE — Patient Instructions (Signed)
Tiffany Jacobs,  It was great to talk to you today!  Please call me with any questions or concerns.  Visit Information   Following is a copy of your full care plan:  Care Plan : Medication Management  Updates made by Beryle Lathe, [place] since 03/18/2021 12:00 AM     Problem: T2DM, HTN, HLD   Priority: High  Onset Date: 03/18/2021     Long-Range Goal: Disease Progression Prevention   Start Date: 03/18/2021  Expected End Date: 06/16/2021  This Visit's Progress: On track  Priority: High  Note:   Current Barriers:  Unable to independently monitor therapeutic efficacy Unable to achieve control of diabetes and hyperlipidemia  Pharmacist Clinical Goal(s):  Through collaboration with PharmD and provider, patient will  Achieve adherence to monitoring guidelines and medication adherence to achieve therapeutic efficacy Achieve control of diabetes and hyperlipidemia as evidenced by improved fasting blood sugar, improved A1c, and improved LDL   Interventions: 1:1 collaboration with Fayrene Helper, MD regarding development and update of comprehensive plan of care as evidenced by provider attestation and co-signature Inter-disciplinary care team collaboration (see longitudinal plan of care) Comprehensive medication review performed; medication list updated in electronic medical record  Type 2 Diabetes - New goal.: Uncontrolled; Most recent A1c above goal of <7% per ADA guidelines Current medications: semaglutide (Ozempic) 0.25 mg subcutaneously weekly, glipizide XL 10 mg by mouth with breakfast, and insulin glargine (Toujeo) 50 units subcutaneously once daily Intolerances: none Taking medications as directed: no, patient reports she has been forgetting to take her Ozempic once weekly Side effects thought to be attributed to current medication regimen: no Denies hypoglycemic symptoms (sweaty and shaky). Hypoglycemia prevention: not indicated at this time Current meal  patterns:  patient reports she is reducing intake of sugar and bread. Is trying to eat more fruits/vegetables Current exercise: not discussed today On a statin: yes On aspirin 81 mg daily: no Last microalbumin/creatinine ratio: 5 (05/21/19); on an ACEi/ARB: no Last eye exam: overdue Last foot exam: completed within last year Current glucose readings:  Patient verbally reports blood glucose has improved to mostly 200s now instead of 300s Continue current medications as above per endocrinology Encouraged regular aerobic exercise with a goal of 30 minutes five times per week (150 minutes per week) Patient identified as a good candidate for continuous glucose monitor. Discussed with patient at request of primary care provider. Patient currently has [employer] plan that would cover [employer] 2 at the patient's pharmacy; however, patient reports she is planning to switch back to [facility] Medicare effective 04/07/21. She will not meet the requirements for coverage according to [facility]. Therefore, she will have to continue checking finger stick blood glucose as instructed. Continue follow-up with endocrinology Discussed need for medication compliance. Patient instructed to set reminder to remember to take weekly dose of Ozempic. Patient reminded to increase dose to 0.5 mg subcutaneously weekly after 2 doses of 0.25 mg subcutaneously weekly as long as she is tolerating well.  Hypertension - New goal.: Blood pressure under good control. Blood pressure is at goal of <130/80 mmHg per 2017 AHA/ACC guidelines. Current medications: amlodipine 10 mg by mouth once daily, spironolactone 25 mg by mouth once daily, metoprolol tartrate 25 mg by mouth twice daily, and hydralazine 25 mg by mouth three times daily Intolerances:  ACE inhibitor (cough) Taking medications as directed: yes Side effects thought to be attributed to current medication regimen: no Current home blood pressure: not  discussed today Continue  current medications as above for now; however, may be reasonable to consider angiotensin receptor blocker or thiazide if additional blood pressure lowering required.  Encourage dietary sodium restriction/DASH diet Recommend home blood pressure monitoring to discuss at next visit Discussed need for and importance of continued work on weight loss Discussed need for medication compliance  Hyperlipidemia - New goal.: Uncontrolled. LDL above goal of <70 due to very high risk given 10-year risk >20% per 2020 AACE/ACE guidelines. Triglycerides above goal of <150 per 2020 AACE/ACE guidelines. Current medications: atorvastatin 20 mg by mouth once daily Intolerances: none Taking medications as directed: yes Side effects thought to be attributed to current medication regimen: no Continue atorvastatin 20 mg by mouth once daily for now; however, given elevated cardiovascular disease risk, may be reasonable to increase to high intensity statin.  Re-check lipid panel a few days before next primary care provider visit. If LDL remains above goal then would recommend addition of ezetimibe 10 mg by mouth daily.  Encourage dietary reduction of high fat containing foods such as butter, nuts, bacon, egg yolks, etc. Discussed need for and importance of continued work on weight loss Discussed need for medication compliance  Patient Goals/Self-Care Activities Patient will:  Focus on medication adherence by keeping up with prescription refills and either using a pill box or reminders to take your medications at the prescribed times Check blood sugar as instructed by endocrinology, document, and provide at future appointments Engage in dietary modifications by fewer sweetened foods & beverages  Follow Up Plan: Face to Face appointment with care management team member scheduled for: 04/07/21     Consent to CCM Services: Tiffany Jacobs was given information about Chronic Care Management  services including:  CCM service includes personalized support from designated clinical staff supervised by her physician, including individualized plan of care and coordination with other care providers 24/7 contact phone numbers for assistance for urgent and routine care needs. Service will only be billed when office clinical staff spend 20 minutes or more in a month to coordinate care. Only one practitioner may furnish and bill the service in a calendar month. The patient may stop CCM services at any time (effective at the end of the month) by phone call to the office staff. The patient will be responsible for cost sharing (co-pay) of up to 20% of the service fee (after annual deductible is met).  Patient agreed to services and verbal consent obtained.   Plan: Face to Face appointment with care management team member scheduled for: 04/07/21 Future Appointments  Date Time Provider [place]  03/25/2021  1:30 PM Brita Romp, NP [facility]A-[facility] None  04/07/2021  4:00 PM RPC-CCM PHARMACIST RPC-RPC RPC  06/22/2021  3:40 PM Fayrene Helper, MD [facility]  01/11/2022  2:00 PM RPC-RPC NURSE RPC-RPC RPC    The patient verbalized understanding of instructions, educational materials, and care plan provided today and agreed to receive a mailed copy of patient instructions, educational materials, and care plan.   Please call the care guide team at [phone] if you need to cancel or reschedule your appointment.   Kennon Holter, PharmD, [place], CPP Clinical Pharmacist Practitioner [facility] [phone]

## 2021-03-24 NOTE — Patient Instructions (Incomplete)

## 2021-03-25 ENCOUNTER — Ambulatory Visit: Payer: Medicare HMO | Admitting: Nurse Practitioner

## 2021-03-25 DIAGNOSIS — E782 Mixed hyperlipidemia: Secondary | ICD-10-CM

## 2021-03-25 DIAGNOSIS — E1159 Type 2 diabetes mellitus with other circulatory complications: Secondary | ICD-10-CM

## 2021-03-25 DIAGNOSIS — I10 Essential (primary) hypertension: Secondary | ICD-10-CM

## 2021-04-06 DIAGNOSIS — E782 Mixed hyperlipidemia: Secondary | ICD-10-CM

## 2021-04-06 DIAGNOSIS — I10 Essential (primary) hypertension: Secondary | ICD-10-CM

## 2021-04-06 DIAGNOSIS — E1159 Type 2 diabetes mellitus with other circulatory complications: Secondary | ICD-10-CM | POA: Diagnosis not present

## 2021-04-07 ENCOUNTER — Ambulatory Visit: Payer: Medicare Other

## 2021-04-08 ENCOUNTER — Telehealth: Payer: Self-pay | Admitting: *Deleted

## 2021-04-08 NOTE — Chronic Care Management (AMB) (Signed)
°  Care Management   Note  04/08/2021 Name: KENNEDEY DIGILIO MRN: [id number] DOB: 1945/03/30  Tiffany Jacobs is a 76 y.o. year old female who is a primary care patient of Fayrene Helper, MD and is actively engaged with the care management team. I reached out to [name] by phone today to assist with re-scheduling a follow up visit with the Pharmacist  Follow up plan: Unsuccessful telephone outreach attempt made. The care management team will reach out to the patient again over the next 7 days. If patient returns call to provider office, please advise to call [facility]  Direct Dial: [phone]

## 2021-04-14 ENCOUNTER — Ambulatory Visit (INDEPENDENT_AMBULATORY_CARE_PROVIDER_SITE_OTHER): Payer: Medicare Other | Admitting: Family Medicine

## 2021-04-14 ENCOUNTER — Other Ambulatory Visit: Payer: Self-pay | Admitting: Nurse Practitioner

## 2021-04-14 ENCOUNTER — Other Ambulatory Visit: Payer: Self-pay

## 2021-04-14 VITALS — BP 160/84 | HR 82 | Resp 16 | Ht 62.0 in | Wt 188.1 lb

## 2021-04-14 DIAGNOSIS — E782 Mixed hyperlipidemia: Secondary | ICD-10-CM

## 2021-04-14 DIAGNOSIS — E785 Hyperlipidemia, unspecified: Secondary | ICD-10-CM | POA: Diagnosis not present

## 2021-04-14 DIAGNOSIS — I10 Essential (primary) hypertension: Secondary | ICD-10-CM

## 2021-04-14 DIAGNOSIS — E1159 Type 2 diabetes mellitus with other circulatory complications: Secondary | ICD-10-CM

## 2021-04-14 MED ORDER — ALBUTEROL SULFATE HFA 108 (90 BASE) MCG/ACT IN AERS: 2.0000 | INHALATION_SPRAY | Freq: Four times a day (QID) | RESPIRATORY_TRACT | 3 refills | Status: DC | PRN

## 2021-04-14 NOTE — Patient Instructions (Signed)

## 2021-04-14 NOTE — Patient Instructions (Addendum)
F/U re evaluate blood pressure in 4 to 5 weeks, call if you need me sooner  INCREASE hydralazine 25 mg to TWO tablets three times daily  Blood sugar is uncontrolled, KEEP appointment with Endocrinology please  You are not cleared for surgical procedure at this time, unless an emergency.  Your blood pressure and blood sugar need to be controlled  Thanks for choosing [facility], we consider it a privelige to serve you. Lab today lipid, cmp and EGFR

## 2021-04-15 ENCOUNTER — Ambulatory Visit: Payer: Medicare Other | Admitting: Nurse Practitioner

## 2021-04-15 ENCOUNTER — Encounter: Payer: Self-pay | Admitting: Nurse Practitioner

## 2021-04-15 VITALS — BP 158/70 | HR 78 | Ht 62.0 in | Wt 194.0 lb

## 2021-04-15 DIAGNOSIS — I10 Essential (primary) hypertension: Secondary | ICD-10-CM

## 2021-04-15 DIAGNOSIS — E1159 Type 2 diabetes mellitus with other circulatory complications: Secondary | ICD-10-CM | POA: Diagnosis not present

## 2021-04-15 DIAGNOSIS — E782 Mixed hyperlipidemia: Secondary | ICD-10-CM | POA: Diagnosis not present

## 2021-04-15 LAB — POCT GLYCOSYLATED HEMOGLOBIN (HGB A1C): HbA1c POC (<> result, manual entry): 12 % (ref 4.0–5.6)

## 2021-04-15 MED ORDER — TOUJEO MAX SOLOSTAR 300 UNIT/ML ~~LOC~~ SOPN: 60.0000 [IU] | PEN_INJECTOR | Freq: Every evening | SUBCUTANEOUS | 3 refills | Status: DC

## 2021-04-15 MED ORDER — OZEMPIC (0.25 OR 0.5 MG/DOSE) 2 MG/1.5ML ~~LOC~~ SOPN: 0.5000 mg | PEN_INJECTOR | SUBCUTANEOUS | 3 refills | Status: DC

## 2021-04-15 NOTE — Progress Notes (Signed)
04/15/2021, 2:57 PM  Endocrinology follow-up note  Subjective:    Patient ID: Tiffany Jacobs, female    DOB: 1945-10-05.  [name] is being seen for follow-up of currently uncontrolled type 2 diabetes. PMD:  Fayrene Helper, MD.   Past Medical History:  Diagnosis Date   Arthritis    Asthma 1963   Chest pain with high risk for cardiac etiology 08/21/2017   Diabetes ([place])    Diabetes mellitus, type 2 ([place]) 2004   GERD (gastroesophageal reflux disease) 2004   Hypertension 2004   Low back pain    Obesity    RAD (reactive airway disease)    Sciatica    Seasonal allergies     Past Surgical History:  Procedure Laterality Date   ACROMIO-CLAVICULAR JOINT REPAIR Right 11/09/2012   Procedure: ACROMIO-CLAVICULAR JOINT REPAIR;  Surgeon: Carole Civil, MD;  Location: [facility];  Service: Orthopedics;  Laterality: Right;   COLONOSCOPY N/A 10/10/2016   Procedure: COLONOSCOPY;  Surgeon: Danie Binder, MD;  Location: [facility];  Service: Endoscopy;  Laterality: N/A;  10:30   EYE SURGERY Right 05/09/2018   cataract   left breast biopsy for benign disease     SHOULDER ACROMIOPLASTY Right 11/09/2012   Procedure: RIGHT SHOULDER ACROMIOPLASTY;  Surgeon: Carole Civil, MD;  Location: [facility];  Service: Orthopedics;  Laterality: Right;   SHOULDER OPEN ROTATOR CUFF REPAIR Right 11/09/2012   Procedure: ROTATOR CUFF REPAIR Right SHOULDER OPEN;  Surgeon: Carole Civil, MD;  Location: [facility];  Service: Orthopedics;  Laterality: Right;   total knee arthroplasty left  02-01-05   Dr. Aline Brochure   TUBAL LIGATION  1970    Social History   Socioeconomic History   Marital status: Married    Spouse name: Jeneen Rinks    Number of children: 7   Years of education: 8   Highest education level: GED or equivalent  Occupational History   Occupation: disabled   Tobacco Use   Smoking status: Former    Packs/day: 0.50    Years: 37.00    Pack years: 18.50     Types: Cigarettes    Quit date: 10/12/1988    Years since quitting: 32.5   Smokeless tobacco: Never  Vaping Use   Vaping Use: Never used  Substance and Sexual Activity   Alcohol use: No   Drug use: No   Sexual activity: Not Currently    Birth control/protection: Post-menopausal  Other Topics Concern   Not on file  Social History Narrative   Not on file   Social Determinants of Health   Financial Resource Strain: Low Risk    Difficulty of Paying Living Expenses: Not hard at all  Food Insecurity: No Food Insecurity   Worried About Charity fundraiser in the Last Year: Never true   [place] in the Last Year: Never true  Transportation Needs: No Transportation Needs   Lack of Transportation (Medical): No   Lack of Transportation (Non-Medical): No  Physical Activity: Sufficiently Active   Days of Exercise per Week: 7 days   Minutes of Exercise per Session: 60 min  Stress: Not on file  Social Connections: Socially Isolated   Frequency of Communication with Friends and Family: More than three times a week   Frequency of Social Gatherings with Friends and Family: More than three times a week   Attends Religious Services: Never   Marine scientist or Organizations:  No   Attends Archivist Meetings: Never   Marital Status: Widowed    Family History  Problem Relation Age of Onset   Kidney failure Father    Kidney disease Father    Stroke Brother 65       used drugs , stroke and heart disease   Diabetes Brother    Hypertension Brother    Cancer Daughter 17       breast    Diabetes Mother    Asthma Other    Lung disease Other    Cancer Other    Heart disease Other    Arthritis Other    Colon cancer Neg Hx    Colon polyps Neg Hx     Outpatient Encounter Medications as of 04/15/2021  Medication Sig   albuterol (PROVENTIL) (2.5 MG/3ML) 0.083% nebulizer solution Take 2.5 mg by nebulization every 6 (six) hours as needed for wheezing or shortness of breath.     albuterol (VENTOLIN HFA) 108 (90 Base) MCG/ACT inhaler Inhale 2 puffs into the lungs every 6 (six) hours as needed for wheezing or shortness of breath.   Alcohol Swabs (B-D SINGLE USE SWABS REGULAR) PADS Use to test as directed   amlodipine-atorvastatin (CADUET) 10-20 MG tablet Take 1 tablet by mouth daily.   blood glucose meter kit and supplies Two times daily testing (dispense meter based on insurance preference) dx e11.65   gabapentin (NEURONTIN) 300 MG capsule Take 1 capsule (300 mg total) by mouth at bedtime.   glipiZIDE (GLUCOTROL XL) 10 MG 24 hr tablet Take 10 mg by mouth daily with breakfast.   glucose blood test strip Use as instructed once daily dx E11.9   hydrALAZINE (APRESOLINE) 25 MG tablet Take TWO tablets by mouth three times daily for blood pressure   insulin glargine, 2 Unit Dial, (TOUJEO MAX SOLOSTAR) 300 UNIT/ML Solostar Pen Inject 60 Units into the skin at bedtime.   Insulin Pen Needle 31G X 5 MM MISC To be used with Lantus insulin pen. Dispensed based on patient's insurance coverage. Please make sure these fit on the patients lantus pen   latanoprost (XALATAN) 0.005 % ophthalmic solution SMARTSIG:1 Drop(s) In Eye(s) Every Evening   metoprolol tartrate (LOPRESSOR) 25 MG tablet Take 1 tablet (25 mg total) by mouth 2 (two) times daily.   montelukast (SINGULAIR) 10 MG tablet Take 1 tablet (10 mg total) by mouth at bedtime.   nitroGLYCERIN (NITROSTAT) 0.4 MG SL tablet DISSOLVE ONE TABLET UNDER THE TONGUE EVERY 5 MINUTES AS NEEDED FOR CHEST PAIN. DO NOT EXCEED A TOTAL OF 3 DOSES IN 15 MINUTES   pantoprazole (PROTONIX) 20 MG tablet Take 1 tablet (20 mg total) by mouth daily.   Semaglutide,0.25 or 0.5MG/DOS, (OZEMPIC, 0.25 OR 0.5 MG/DOSE,) 2 MG/1.5ML SOPN Inject 0.5 mg into the skin once a week.   spironolactone (ALDACTONE) 25 MG tablet Take 1 tablet (25 mg total) by mouth daily.   TRUEplus Lancets 33G MISC TEST BLOOD SUGAR THREE TIMES DAILY   [DISCONTINUED] insulin glargine, 2  Unit Dial, (TOUJEO MAX SOLOSTAR) 300 UNIT/ML Solostar Pen Inject 50 Units into the skin at bedtime. (Patient taking differently: Inject 50 Units into the skin every morning.)   [DISCONTINUED] Semaglutide,0.25 or 0.5MG/DOS, (OZEMPIC, 0.25 OR 0.5 MG/DOSE,) 2 MG/1.5ML SOPN Inject 0.5 mg into the skin once a week. (Patient taking differently: Inject 0.25 mg into the skin once a week.)   No facility-administered encounter medications on file as of 04/15/2021.    ALLERGIES: Allergies  Allergen Reactions  Ace Inhibitors Cough   Latex Itching and Rash    VACCINATION STATUS: Immunization History  Administered Date(s) Administered   Fluad Quad(high Dose 65+) 12/04/2018, 12/18/2019, 12/22/2020   Influenza Whole 03/29/2006, 11/27/2009   Influenza,inj,Quad PF,6+ Mos 03/20/2013, 01/24/2014, 12/25/2014, 01/22/2016, 10/31/2016, 01/31/2017, 10/19/2017   Pneumococcal Conjugate-13 06/06/2014   Pneumococcal Polysaccharide-23 08/27/2004, 11/27/2009, 08/27/2015   Td 08/27/2004    Diabetes She presents for her follow-up diabetic visit. She has type 2 diabetes mellitus. Onset time: She was diagnosed at approximate age of 62. Her disease course has been improving. Pertinent negatives for hypoglycemia include no confusion, headaches, hunger, pallor or seizures. Associated symptoms include blurred vision, fatigue, polydipsia and polyuria. Pertinent negatives for diabetes include no chest pain and no polyphagia. There are no hypoglycemic complications. Symptoms are stable. Diabetic complications include nephropathy. Risk factors for coronary artery disease include dyslipidemia, diabetes mellitus, obesity, sedentary lifestyle, post-menopausal, family history and hypertension. Current diabetic treatment includes insulin injections and oral agent (monotherapy). She is compliant with treatment most of the time. Her weight is fluctuating minimally. She is following a generally unhealthy diet. When asked about meal planning,  she reported none. She has had a previous visit with a dietitian. She never participates in exercise. Her home blood glucose trend is fluctuating minimally. Her breakfast blood glucose range is generally >200 mg/dl. Her overall blood glucose range is >200 mg/dl. (She presents today with her meter and logs showing still above target fasting glycemic profile, she has not been checking before bed as asked.  Her POCT A1c today is 12%, improving slightly from last visit of 14.6%.  She still struggles with her sugar cravings but is working on cutting out sodas altogether.  Analysis of her meter shows 7-day average of 246, 14-day average of 222; 30-day average of 249, and 90-day average of 322.  She has only been taking 0.25 mg of the Ozempic instead of the 0.5 mg dose and she has a tendency to forget it altogether at times.) An ACE inhibitor/angiotensin II receptor blocker is not being taken. Eye exam is current.  Hypertension This is a chronic problem. The current episode started more than 1 year ago. The problem is unchanged. The problem is controlled. Associated symptoms include blurred vision. Pertinent negatives include no chest pain or headaches. There are no associated agents to hypertension. Risk factors for coronary artery disease include diabetes mellitus, dyslipidemia, family history, obesity, post-menopausal state and sedentary lifestyle. Past treatments include diuretics, beta blockers, calcium channel blockers and direct vasodilators. The current treatment provides moderate improvement. Compliance problems include diet and exercise.  Hypertensive end-organ damage includes kidney disease. Identifiable causes of hypertension include chronic renal disease.  Hyperlipidemia This is a chronic problem. The current episode started more than 1 year ago. The problem is controlled. Recent lipid tests were reviewed and are normal. Exacerbating diseases include chronic renal disease, diabetes and obesity. Factors  aggravating her hyperlipidemia include fatty foods and beta blockers. Pertinent negatives include no chest pain. Current antihyperlipidemic treatment includes statins. The current treatment provides moderate improvement of lipids. Compliance problems include adherence to diet and adherence to exercise.  Risk factors for coronary artery disease include diabetes mellitus, dyslipidemia, family history, obesity, hypertension, post-menopausal and a sedentary lifestyle.   Review of systems  Constitutional: + Minimally fluctuating body weight,  current Body mass index is 35.48 kg/m. , + fatigue, no subjective hyperthermia, no subjective hypothermia Eyes: + blurry vision, no xerophthalmia ENT: no sore throat, no nodules palpated in throat, no dysphagia/odynophagia,  no hoarseness Cardiovascular: no chest pain, no shortness of breath, no palpitations, no leg swelling Respiratory: no cough, no shortness of breath Gastrointestinal: no nausea/vomiting/diarrhea Musculoskeletal: no muscle/joint aches Skin: no rashes, no hyperemia Neurological: no tremors, no numbness, no tingling, no dizziness Psychiatric: no depression, no anxiety    Objective:    BP (!) 158/70    Pulse 78    Ht '5\' 2"'  (1.575 m)    Wt 194 lb (88 kg)    BMI 35.48 kg/m   Wt Readings from Last 3 Encounters:  04/15/21 194 lb (88 kg)  04/14/21 188 lb 1.9 oz (85.3 kg)  02/26/21 192 lb (87.1 kg)    BP Readings from Last 3 Encounters:  04/15/21 (!) 158/70  04/14/21 (!) 160/84  02/26/21 138/78     Physical Exam- Limited  Constitutional:  Body mass index is 35.48 kg/m. , not in acute distress, ? Memory impairment Eyes:  EOMI, no exophthalmos Neck: Supple Cardiovascular: RRR, no murmurs, rubs, or gallops, no edema Respiratory: Adequate breathing efforts, no crackles, rales, rhonchi, or wheezing Musculoskeletal: no gross deformities, strength intact in all four extremities, no gross restriction of joint movements Skin:  no rashes, no  hyperemia Neurological: no tremor with outstretched hands     CMP     Component Value Date/Time   NA 136 12/22/2020 1602   K 4.4 12/22/2020 1602   CL 94 (L) 12/22/2020 1602   CO2 27 12/22/2020 1602   GLUCOSE 370 (H) 12/22/2020 1602   GLUCOSE 178 (H) 05/21/2019 0929   BUN 11 12/22/2020 1602   CREATININE 0.80 12/22/2020 1602   CREATININE 0.73 05/21/2019 0929   CALCIUM 10.2 12/22/2020 1602   PROT 7.1 08/13/2020 1614   ALBUMIN 4.1 08/13/2020 1614   AST 28 08/13/2020 1614   ALT 25 08/13/2020 1614   ALKPHOS 127 (H) 08/13/2020 1614   BILITOT 0.3 08/13/2020 1614   GFRNONAA 76 12/12/2019 1050   GFRNONAA 82 05/21/2019 0929   GFRAA 88 12/12/2019 1050   GFRAA 95 05/21/2019 0929     Diabetic Labs (most recent): Lab Results  Component Value Date   HGBA1C 12 04/15/2021   HGBA1C 14.6 (H) 12/22/2020   HGBA1C 13.2 (A) 08/13/2020     Lipid Panel ( most recent) Lipid Panel     Component Value Date/Time   CHOL 172 08/13/2020 1614   TRIG 151 (H) 08/13/2020 1614   HDL 51 08/13/2020 1614   CHOLHDL 3.4 08/13/2020 1614   CHOLHDL 3.0 05/21/2019 0929   VLDL 20 07/22/2016 1004   LDLCALC 95 08/13/2020 1614   LDLCALC 107 (H) 05/21/2019 0929      Lab Results  Component Value Date   TSH 1.730 08/13/2020   TSH 2.58 05/21/2019   TSH 1.71 01/31/2017   TSH 2.75 05/26/2015   TSH 3.261 06/02/2014   TSH 3.039 05/07/2013   TSH 3.308 10/10/2011   TSH 2.811 03/19/2010   TSH 3.422 08/12/2009   FREET4 0.9 05/21/2019       Assessment & Plan:   1) Type 2 diabetes mellitus with vascular disease ([place])  - [place] has currently uncontrolled symptomatic type 2 DM since  76 years of age.  She presents today with her meter and logs showing still above target fasting glycemic profile, she has not been checking before bed as asked.  Her POCT A1c today is 12%, improving slightly from last visit of 14.6%.  She still struggles with her sugar cravings but is working on cutting out sodas  altogether.  Analysis of her meter shows 7-day average of 246, 14-day average of 222; 30-day average of 249, and 90-day average of 322.  She has only been taking 0.25 mg of the Ozempic instead of the 0.5 mg dose and she has a tendency to forget it altogether at times.  Recent labs reviewed.  -her diabetes is complicated by obesity/sedentary life and she remains at a high risk for more acute and chronic complications which include CAD, CVA, CKD, retinopathy, and neuropathy. These are all discussed in detail with her.  - Nutritional counseling repeated at each appointment due to patients tendency to fall back in to old habits.  - The patient admits there is a room for improvement in their diet and drink choices. -  Suggestion is made for the patient to avoid simple carbohydrates from their diet including Cakes, Sweet Desserts / Pastries, Ice Cream, Soda (diet and regular), Sweet Tea, Candies, Chips, Cookies, Sweet Pastries, Store Bought Juices, Alcohol in Excess of 1-2 drinks a day, Artificial Sweeteners, Coffee Creamer, and "Sugar-free" Products. This will help patient to have stable blood glucose profile and potentially avoid unintended weight gain.   - I encouraged the patient to switch to unprocessed or minimally processed complex starch and increased protein intake (animal or plant source), fruits, and vegetables.   - Patient is advised to stick to a routine mealtimes to eat 3 meals a day and avoid unnecessary snacks (to snack only to correct hypoglycemia).  - she is following with Jearld Fenton, RDN, CDE for individualized diabetes education.  - I have approached her with the following individualized plan to manage diabetes and patient agrees:   -She is advised to increase her Toujeo to 60 units SQ with breakfast (cannot remember to take it at night), continue her Glipizide 10 mg XL daily with breakfast and increase her Ozempic to 0.5 mg SQ weekly.  She says she will do better to remember  that injection by setting an alarm on her phone.   -She is encouraged to continue monitoring blood glucose twice daily, before breakfast and before bed, and to call the clinic if she has readings less than 70 or above 300 for 3 tests in a row.   - she is warned not to take insulin without proper monitoring per orders.  - Patient specific target  A1c;  LDL, HDL, Triglycerides, and  Waist Circumference were discussed in detail.  2) Blood Pressure /Hypertension:  Her blood pressure is controlled to target for her age.  she is advised to continue her current medications including Amlodipine 10 mg po daily, Spironolactone 25 mg po daily, Hydralazine 25 mg po daily, and Metoprolol 25 mg po daily.  3) Lipids/Hyperlipidemia:    Her most recent lipid panel from 08/13/20 shows controlled LDL of 95 and elevated triglycerides of 151.   she is advised to continue Atorvastatin 20 mg p.o. daily at bedtime.  Side effects and precautions discussed with her.  Lipid panel to be checked by PCP.  4)  Weight/Diet:  Her Body mass index is 35.48 kg/m.-   clearly complicating her diabetes care.  I discussed with her the fact that loss of 5 - 10% of her  current body weight will have the most impact on her diabetes management.  CDE Consult will be initiated . Exercise, and detailed carbohydrates information provided  -  detailed on discharge instructions.  5) Chronic Care/Health Maintenance: -she is on Statin medications and  is encouraged to initiate and continue to  follow up with Ophthalmology, Dentist,  Podiatrist at least yearly or according to recommendations, and advised to stay away from smoking. I have recommended yearly flu vaccine and pneumonia vaccine at least every 5 years; moderate intensity exercise for up to 150 minutes weekly; and  sleep for at least 7 hours a day.  - she is advised to maintain close follow up with Fayrene Helper, MD for primary care needs, as well as her other providers for optimal  and coordinated care.      I spent 34 minutes in the care of the patient today including review of labs from [place], Lipids, Thyroid Function, Hematology (current and previous including abstractions from other facilities); face-to-face time discussing  her blood glucose readings/logs, discussing hypoglycemia and hyperglycemia episodes and symptoms, medications doses, her options of short and long term treatment based on the latest standards of care / guidelines;  discussion about incorporating lifestyle medicine;  and documenting the encounter.    Please refer to Patient Instructions for Blood Glucose Monitoring and Insulin/Medications Dosing Guide"  in media tab for additional information. Please  also refer to " Patient Self Inventory" in the Media  tab for reviewed elements of pertinent patient history.  [name] participated in the discussions, expressed understanding, and voiced agreement with the above plans.  All questions were answered to her satisfaction. she is encouraged to contact clinic should she have any questions or concerns prior to her return visit.   Follow up plan: - Return in 1 month (on 05/13/2021) for No previsit labs, Bring meter and logs, Diabetes F/U.  Rayetta Pigg, [facility] [address] [place], [place] [address] Phone: [phone] Fax: [phone]  04/15/2021, 2:57 PM

## 2021-04-15 NOTE — Chronic Care Management (AMB) (Signed)
°  Care Management   Note  04/15/2021 Name: Tiffany Jacobs MRN: [id number] DOB: 1945-11-03  Tiffany Jacobs is a 76 y.o. year old female who is a primary care patient of Fayrene Helper, MD and is actively engaged with the care management team. I reached out to [name] by phone today to assist with re-scheduling a follow up visit with the Pharmacist  Follow up plan: Unsuccessful telephone outreach attempt made. The care management team will reach out to the patient again over the next 7 days. If patient returns call to provider office, please advise to call [place] [facility] [place] [facility]  Direct Dial: [phone]

## 2021-04-19 ENCOUNTER — Encounter: Payer: Self-pay | Admitting: Family Medicine

## 2021-04-19 NOTE — Assessment & Plan Note (Signed)
Uncontrolled inc hydralazine does and re eval DASH diet and commitment to daily physical activity for a minimum of 30 minutes discussed and encouraged, as a part of hypertension management. The importance of attaining a healthy weight is also discussed.  BP/Weight 04/15/2021 04/14/2021 02/26/2021 02/03/2021 01/20/2021 81/84/0375 06/07/6065  Systolic BP 703 403 524 818 590 931 121  Diastolic BP 70 84 78 76 77 90 91  Wt. (Lbs) 194 188.12 192 192 193.8 192.04 193  BMI 35.48 34.41 34.01 34.56 34.88 34.56 34.74

## 2021-04-19 NOTE — Assessment & Plan Note (Signed)
Hyperlipidemia:Low fat diet discussed and encouraged.   Lipid Panel  Lab Results  Component Value Date   CHOL 172 08/13/2020   HDL 51 08/13/2020   LDLCALC 95 08/13/2020   TRIG 151 (H) 08/13/2020   CHOLHDL 3.4 08/13/2020  Updated lab needed at/ before next visit.

## 2021-04-19 NOTE — Assessment & Plan Note (Signed)
Tiffany Jacobs is reminded of the importance of commitment to daily physical activity for 30 minutes or more, as able and the need to limit carbohydrate intake to 30 to 60 grams per meal to help with blood sugar control.   The need to take medication as prescribed, test blood sugar as directed, and to call between visits if there is a concern that blood sugar is uncontrolled is also discussed.   Tiffany Jacobs is reminded of the importance of daily foot exam, annual eye examination, and good blood sugar, blood pressure and cholesterol control. Uncontrolled and worsened, needs to f/u with Endo and commit to diet and medication management   Diabetic Labs Latest Ref Rng & Units 04/15/2021 12/22/2020 08/13/2020 05/11/2020 12/12/2019  HbA1c 4.0 - 5.6 % 12 14.6(H) 13.2(A) 9.9(A) -  Microalbumin mg/dL - - - - -  Micro/Creat Ratio <30 mcg/mg creat - - - - -  Chol 100 - 199 mg/dL - - 172 - 215(H)  HDL >39 mg/dL - - 51 - 54  Calc LDL 0 - 99 mg/dL - - 95 - 135(H)  Triglycerides 0 - 149 mg/dL - - 151(H) - 145  Creatinine 0.57 - 1.00 mg/dL - 0.80 0.95 - 0.77   BP/Weight 04/15/2021 04/14/2021 02/26/2021 02/03/2021 01/20/2021 95/74/7340 05/12/962  Systolic BP 383 818 403 754 360 677 034  Diastolic BP 70 84 78 76 77 90 91  Wt. (Lbs) 194 188.12 192 192 193.8 192.04 193  BMI 35.48 34.41 34.01 34.56 34.88 34.56 34.74   Foot/eye exam completion dates Latest Ref Rng & Units 12/04/2018 02/14/2018  Eye Exam No Retinopathy - -  Foot exam Order - - -  Foot Form Completion - Done Done

## 2021-04-19 NOTE — Assessment & Plan Note (Signed)
°  Patient re-educated about  the importance of commitment to a  minimum of 150 minutes of exercise per week as able.  The importance of healthy food choices with portion control discussed, as well as eating regularly and within a 12 hour window most days. The need to choose "clean , green" food 50 to 75% of the time is discussed, as well as to make water the primary drink and set a goal of 64 ounces water daily.    Weight /BMI 04/15/2021 04/14/2021 02/26/2021  WEIGHT 194 lb 188 lb 1.9 oz 192 lb  HEIGHT 5\' 2"  5\' 2"  5\' 3"   BMI 35.48 kg/m2 34.41 kg/m2 34.01 kg/m2

## 2021-04-19 NOTE — Progress Notes (Signed)
[facility]     MRN: [id number]      DOB: 06-Jun-1945   HPI Tiffany Jacobs is here for medical clearance  For necessary dental work, unfortunately she is currently medically unstable as both her blood pressure and  her diabetes are uncontrolled ROS Denies recent fever or chills. Denies sinus pressure, nasal congestion, ear pain or sore throat. Denies chest congestion, productive cough or wheezing. Denies chest pains, palpitations and leg swelling Denies abdominal pain, nausea, vomiting,diarrhea or constipation.   Denies dysuria, frequency, hesitancy or incontinence.  Denies headaches, seizures, numbness, or tingling. Denies depression, anxiety or insomnia. Denies skin break down or rash.   PE  BP (!) 160/84    Pulse 82    Resp 16    Ht 5\' 2"  (1.575 m)    Wt 188 lb 1.9 oz (85.3 kg)    SpO2 (!) 89%    BMI 34.41 kg/m   Patient alert and oriented and in no cardiopulmonary distress.  HEENT: No facial asymmetry, EOMI,     Neck supple .  Chest: Clear to auscultation bilaterally.  CVS: S1, S2 no murmurs, no S3.Regular rate.  ABD: Soft non tender.   Ext: No edema  MS: Adequate ROM spine, shoulders, hips and knees.  Skin: Intact, no ulcerations or rash noted.  Psych: Good eye contact, normal affect. Memory intact not anxious or depressed appearing.  CNS: CN 2-12 intact, power,  normal throughout.no focal deficits noted.   Assessment & Plan   Primary hypertension Uncontrolled inc hydralazine does and re eval DASH diet and commitment to daily physical activity for a minimum of 30 minutes discussed and encouraged, as a part of hypertension management. The importance of attaining a healthy weight is also discussed.  BP/Weight 04/15/2021 04/14/2021 02/26/2021 02/03/2021 01/20/2021 17/51/0258 07/06/7780  Systolic BP 423 536 144 315 400 867 619  Diastolic BP 70 84 78 76 77 90 91  Wt. (Lbs) 194 188.12 192 192 193.8 192.04 193  BMI 35.48 34.41 34.01 34.56 34.88 34.56 34.74        Type 2 diabetes mellitus with vascular disease ([place]) Tiffany Jacobs is reminded of the importance of commitment to daily physical activity for 30 minutes or more, as able and the need to limit carbohydrate intake to 30 to 60 grams per meal to help with blood sugar control.   The need to take medication as prescribed, test blood sugar as directed, and to call between visits if there is a concern that blood sugar is uncontrolled is also discussed.   Tiffany Jacobs is reminded of the importance of daily foot exam, annual eye examination, and good blood sugar, blood pressure and cholesterol control. Uncontrolled and worsened, needs to f/u with Endo and commit to diet and medication management   Diabetic Labs Latest Ref Rng & Units 04/15/2021 12/22/2020 08/13/2020 05/11/2020 12/12/2019  HbA1c 4.0 - 5.6 % 12 14.6(H) 13.2(A) 9.9(A) -  Microalbumin mg/dL - - - - -  Micro/Creat Ratio <30 mcg/mg creat - - - - -  Chol 100 - 199 mg/dL - - 172 - 215(H)  HDL >39 mg/dL - - 51 - 54  Calc LDL 0 - 99 mg/dL - - 95 - 135(H)  Triglycerides 0 - 149 mg/dL - - 151(H) - 145  Creatinine 0.57 - 1.00 mg/dL - 0.80 0.95 - 0.77   BP/Weight 04/15/2021 04/14/2021 02/26/2021 02/03/2021 01/20/2021 50/93/2671 04/11/5807  Systolic BP 983 382 505 397 673 419 379  Diastolic BP 70 84 78 76  77 90 91  Wt. (Lbs) 194 188.12 192 192 193.8 192.04 193  BMI 35.48 34.41 34.01 34.56 34.88 34.56 34.74   Foot/eye exam completion dates Latest Ref Rng & Units 12/04/2018 02/14/2018  Eye Exam No Retinopathy - -  Foot exam Order - - -  Foot Form Completion - Done Done        Morbid obesity ([place])  Patient re-educated about  the importance of commitment to a  minimum of 150 minutes of exercise per week as able.  The importance of healthy food choices with portion control discussed, as well as eating regularly and within a 12 hour window most days. The need to choose "clean , green" food 50 to 75% of the time is discussed, as well as to  make water the primary drink and set a goal of 64 ounces water daily.    Weight /BMI 04/15/2021 04/14/2021 02/26/2021  WEIGHT 194 lb 188 lb 1.9 oz 192 lb  HEIGHT 5\' 2"  5\' 2"  5\' 3"   BMI 35.48 kg/m2 34.41 kg/m2 34.01 kg/m2      Mixed hyperlipidemia Hyperlipidemia:Low fat diet discussed and encouraged.   Lipid Panel  Lab Results  Component Value Date   CHOL 172 08/13/2020   HDL 51 08/13/2020   LDLCALC 95 08/13/2020   TRIG 151 (H) 08/13/2020   CHOLHDL 3.4 08/13/2020  Updated lab needed at/ before next visit.

## 2021-04-21 LAB — CMP14+EGFR
ALT: 45 IU/L
AST: 34 IU/L (ref 0–40)
Albumin/Globulin Ratio: 1.3 (ref 1.2–2.2)
Albumin: 4 g/dL (ref 3.7–4.7)
Alkaline Phosphatase: 149 IU/L — ABNORMAL HIGH (ref 44–121)
BUN/Creatinine Ratio: 13
BUN: 10 mg/dL
Bilirubin Total: <0.2 mg/dL (ref 0.0–1.2)
CO2: 22 mmol/L (ref 20–29)
Calcium: 9.4 mg/dL
Chloride: 99 mmol/L (ref 96–106)
Creatinine, Ser: 0.78 mg/dL
Globulin, Total: 3.1 g/dL (ref 1.5–4.5)
Glucose: 244 mg/dL — ABNORMAL HIGH (ref 70–99)
Potassium: 4 mmol/L (ref 3.5–5.2)
Sodium: 139 mmol/L (ref 134–144)
Total Protein: 7.1 g/dL (ref 6.0–8.5)
eGFR: 79 mL/min/{1.73_m2} (ref >59)

## 2021-04-21 LAB — SPECIMEN STATUS REPORT

## 2021-04-21 LAB — LIPID PANEL
Chol/HDL Ratio: 3.6 ratio
Cholesterol, Total: 200 mg/dL
HDL: 56 mg/dL (ref >39)
LDL Chol Calc (NIH): 129 mg/dL
Triglycerides: 85 mg/dL
VLDL Cholesterol Cal: 15 mg/dL (ref 5–40)

## 2021-04-23 NOTE — Chronic Care Management (AMB) (Signed)
°  Care Management   Note  04/23/2021 Name: Tiffany Jacobs MRN: [id number] DOB: Nov 08, 1945  Tiffany Jacobs is a 76 y.o. year old female who is a primary care patient of Fayrene Helper, MD and is actively engaged with the care management team. I reached out to [name] by phone today to assist with re-scheduling a follow up visit with the Pharmacist  Follow up plan: Unsuccessful telephone outreach attempt made. Unable to make contact on outreach attempts x 3. PCP Dr. Moshe Cipro notified via routed documentation in medical record. We have been unable to make contact with the patient for follow up. The care management team is available to follow up with the patient after provider conversation with the patient regarding recommendation for care management engagement and subsequent re-referral to the care management team.   [facility]  Direct Dial: [phone]

## 2021-04-24 LAB — CMP14+EGFR

## 2021-04-24 LAB — LIPID PANEL

## 2021-04-29 ENCOUNTER — Other Ambulatory Visit: Payer: Self-pay

## 2021-04-29 ENCOUNTER — Telehealth: Payer: Self-pay | Admitting: Family Medicine

## 2021-04-29 DIAGNOSIS — M5010 Cervical disc disorder with radiculopathy, unspecified cervical region: Secondary | ICD-10-CM

## 2021-04-29 NOTE — Telephone Encounter (Signed)
Please remove mailorder form the pt --the patient would like all to go to [employer] [facility]

## 2021-04-29 NOTE — Telephone Encounter (Signed)
Updated chart.

## 2021-05-13 ENCOUNTER — Ambulatory Visit (INDEPENDENT_AMBULATORY_CARE_PROVIDER_SITE_OTHER): Payer: Medicare Other | Admitting: Family Medicine

## 2021-05-13 ENCOUNTER — Encounter: Payer: Self-pay | Admitting: Family Medicine

## 2021-05-13 ENCOUNTER — Other Ambulatory Visit: Payer: Self-pay

## 2021-05-13 VITALS — BP 130/80 | HR 99 | Ht 61.0 in | Wt 195.0 lb

## 2021-05-13 DIAGNOSIS — R2681 Unsteadiness on feet: Secondary | ICD-10-CM

## 2021-05-13 DIAGNOSIS — I10 Essential (primary) hypertension: Secondary | ICD-10-CM

## 2021-05-13 DIAGNOSIS — E782 Mixed hyperlipidemia: Secondary | ICD-10-CM | POA: Diagnosis not present

## 2021-05-13 DIAGNOSIS — E1159 Type 2 diabetes mellitus with other circulatory complications: Secondary | ICD-10-CM

## 2021-05-13 MED ORDER — AMLODIPINE-ATORVASTATIN 10-20 MG PO TABS: 1.0000 | ORAL_TABLET | Freq: Every day | ORAL | 1 refills | Status: DC

## 2021-05-13 NOTE — Patient Instructions (Addendum)
Please reschedule April appointment to mid May, call if you need me sooner ? ?Blood pressure is excellent ? ?Please work on blood sugar so that you will feel better and be able to get dental work done ? ?I recommend physical therapy to help with balance ? ?Your recent labs re your kidney and liver function and cholesterol are excellent ? ?You only need to get blood sugar right! ? ?Thanks for choosing [facility], we consider it a privelige to serve you. ? ?

## 2021-05-16 ENCOUNTER — Encounter: Payer: Self-pay | Admitting: Family Medicine

## 2021-05-16 DIAGNOSIS — R2681 Unsteadiness on feet: Secondary | ICD-10-CM | POA: Insufficient documentation

## 2021-05-16 NOTE — Progress Notes (Signed)
? ?Tiffany Jacobs     MRN: [id number]      DOB: November 02, 1945 ? ? ?HPI ?[name] is here for follow up and re-evaluation of chronic medical conditions, medication management and review of any available recent lab and radiology data.  ?Preventive health is updated, specifically  Cancer screening and Immunization.   ?Questions or concerns regarding consultations or procedures which the PT has had in the interim are  addressed. ?The PT denies any adverse reactions to current medications since the last visit.  ?C/o feeling of imbalance, denies any falls , denies vertigo ?Blood sugar still too high too often ?Denies polyuria, polydipsia, blurred vision , or hypoglycemic episodes. ?  ? ?ROS ?Denies recent fever or chills. ?Denies sinus pressure, nasal congestion, ear pain or sore throat. ?Denies chest congestion, productive cough or wheezing. ?Denies chest pains, palpitations and leg swelling ?Denies abdominal pain, nausea, vomiting,diarrhea or constipation.   ?Denies dysuria, frequency, hesitancy or incontinence. ?Chronic  joint pain, swelling and limitation in mobility. ?Denies headaches, seizures, numbness, or tingling. ?Denies depression, anxiety or insomnia. ?Denies skin break down or rash. ? ? ?PE ? ?BP 130/80   Pulse 99   Ht '5\' 1"'$  (1.549 m)   Wt 195 lb 0.6 oz (88.5 kg)   SpO2 94%   BMI 36.85 kg/m?  ? ?Patient alert and oriented and in no cardiopulmonary distress. ? ?HEENT: No facial asymmetry, EOMI,     Neck supple . ? ?Chest: Clear to auscultation bilaterally. ? ?CVS: S1, S2 no murmurs, no S3.Regular rate. ? ?ABD: Soft non tender.  ? ?Ext: No edema ? ?MS: decreased  ROM spine, shoulders, hips and knees. ? ?Skin: Intact, no ulcerations or rash noted. ? ?Psych: Good eye contact, normal affect. Memory intact not anxious or depressed appearing. ? ?CNS: CN 2-12 intact, power,  normal throughout.no focal deficits noted. ? ? ?Assessment & Plan ?Primary hypertension ?DASH diet and commitment to daily physical  activity for a minimum of 30 minutes discussed and encouraged, as a part of hypertension management. ?The importance of attaining a healthy weight is also discussed. ? ?BP/Weight 05/13/2021 04/15/2021 04/14/2021 02/26/2021 02/03/2021 01/20/2021 12/22/2020  ?Systolic BP 373 428 768 115 147 134 167  ?Diastolic BP 80 70 84 78 76 77 90  ?Wt. (Lbs) 195.04 194 188.12 192 192 193.8 192.04  ?BMI 36.85 35.48 34.41 34.01 34.56 34.88 34.56  ? ? ? ?Controlled, no change in medication ? ? ?Mixed hyperlipidemia ?Hyperlipidemia:Low fat diet discussed and encouraged. ? ? ?Lipid Panel  ?Lab Results  ?Component Value Date  ? CHOL CANCELED 04/14/2021  ? HDL CANCELED 04/14/2021  ? [place] 129 04/14/2021  ? TRIG CANCELED 04/14/2021  ? CHOLHDL 3.6 04/14/2021  ? ? ? ?Controlled, no change in medication ? ? ?Morbid obesity ([place]) ?Deteriorated , gaining weight ?Patient re-educated about  the importance of commitment to a  minimum of 150 minutes of exercise per week as able. ? ?The importance of healthy food choices with portion control discussed, as well as eating regularly and within a 12 hour window most days. ?The need to choose "clean , green" food 50 to 75% of the time is discussed, as well as to make water the primary drink and set a goal of 64 ounces water daily. ? ?  ?Weight /BMI 05/13/2021 04/15/2021 04/14/2021  ?WEIGHT 195 lb 0.6 oz 194 lb 188 lb 1.9 oz  ?HEIGHT '5\' 1"'$  '5\' 2"'$  '5\' 2"'$   ?BMI 36.85 kg/m2 35.48 kg/m2 34.41 kg/m2  ? ? ? ? ?  Type 2 diabetes mellitus with vascular disease ([place]) ?Managed by endo ?Uncontrolled, but improving ?[name] is reminded of the importance of commitment to daily physical activity for 30 minutes or more, as able and the need to limit carbohydrate intake to 30 to 60 grams per meal to help with blood sugar control.  ? ?The need to take medication as prescribed, test blood sugar as directed, and to call between visits if there is a concern that blood sugar is uncontrolled is also discussed.  ? ?[name] is  reminded of the importance of daily foot exam, annual eye examination, and good blood sugar, blood pressure and cholesterol control. ? ?Diabetic Labs Latest Ref Rng & Units 04/15/2021 04/14/2021 04/14/2021 12/22/2020 08/13/2020  ?HbA1c 4.0 - 5.6 % 12 - - 14.6(H) 13.2(A)  ?Microalbumin mg/dL - - - - -  ?Micro/Creat Ratio <30 mcg/mg creat - - - - -  ?Chol mg/dL - CANCELED 200 - 172  ?HDL - - CANCELED 56 - 51  ?Calc LDL mg/dL - - 129 - 95  ?Triglycerides - - CANCELED 85 - 151(H)  ?Creatinine - - CANCELED 0.78 0.80 0.95  ? ?BP/Weight 05/13/2021 04/15/2021 04/14/2021 02/26/2021 02/03/2021 01/20/2021 12/22/2020  ?Systolic BP 270 350 093 818 147 134 167  ?Diastolic BP 80 70 84 78 76 77 90  ?Wt. (Lbs) 195.04 194 188.12 192 192 193.8 192.04  ?BMI 36.85 35.48 34.41 34.01 34.56 34.88 34.56  ? ?Foot/eye exam completion dates Latest Ref Rng & Units 12/04/2018 02/14/2018  ?Eye Exam No Retinopathy - -  ?Foot exam Order - - -  ?Foot Form Completion - Done Done  ? ? ? ? ? ? ?Unsteady gait when walking ?Physical therapy recommended, not willing to commit currently ?Home safety and fall risk reduction discussed ? ? ?

## 2021-05-16 NOTE — Assessment & Plan Note (Addendum)
Deteriorated , gaining weight ?Patient re-educated about  the importance of commitment to a  minimum of 150 minutes of exercise per week as able. ? ?The importance of healthy food choices with portion control discussed, as well as eating regularly and within a 12 hour window most days. ?The need to choose "clean , green" food 50 to 75% of the time is discussed, as well as to make water the primary drink and set a goal of 64 ounces water daily. ? ?  ?Weight /BMI 05/13/2021 04/15/2021 04/14/2021  ?WEIGHT 195 lb 0.6 oz 194 lb 188 lb 1.9 oz  ?HEIGHT '5\' 1"'$  '5\' 2"'$  '5\' 2"'$   ?BMI 36.85 kg/m2 35.48 kg/m2 34.41 kg/m2  ? ? ? ?

## 2021-05-16 NOTE — Assessment & Plan Note (Signed)
DASH diet and commitment to daily physical activity for a minimum of 30 minutes discussed and encouraged, as a part of hypertension management. ?The importance of attaining a healthy weight is also discussed. ? ?BP/Weight 05/13/2021 04/15/2021 04/14/2021 02/26/2021 02/03/2021 01/20/2021 12/22/2020  ?Systolic BP 779 396 886 484 147 134 167  ?Diastolic BP 80 70 84 78 76 77 90  ?Wt. (Lbs) 195.04 194 188.12 192 192 193.8 192.04  ?BMI 36.85 35.48 34.41 34.01 34.56 34.88 34.56  ? ? ? ?Controlled, no change in medication ? ?

## 2021-05-16 NOTE — Assessment & Plan Note (Signed)
Hyperlipidemia:Low fat diet discussed and encouraged. ? ? ?Lipid Panel  ?Lab Results  ?Component Value Date  ? CHOL CANCELED 04/14/2021  ? HDL CANCELED 04/14/2021  ? [place] 129 04/14/2021  ? TRIG CANCELED 04/14/2021  ? CHOLHDL 3.6 04/14/2021  ? ? ? ?Controlled, no change in medication ? ?

## 2021-05-16 NOTE — Assessment & Plan Note (Signed)
Physical therapy recommended, not willing to commit currently ?Home safety and fall risk reduction discussed ?

## 2021-05-16 NOTE — Assessment & Plan Note (Signed)
Managed by endo ?Uncontrolled, but improving ?Tiffany Jacobs is reminded of the importance of commitment to daily physical activity for 30 minutes or more, as able and the need to limit carbohydrate intake to 30 to 60 grams per meal to help with blood sugar control.  ? ?The need to take medication as prescribed, test blood sugar as directed, and to call between visits if there is a concern that blood sugar is uncontrolled is also discussed.  ? ?Tiffany Jacobs is reminded of the importance of daily foot exam, annual eye examination, and good blood sugar, blood pressure and cholesterol control. ? ?Diabetic Labs Latest Ref Rng & Units 04/15/2021 04/14/2021 04/14/2021 12/22/2020 08/13/2020  ?HbA1c 4.0 - 5.6 % 12 - - 14.6(H) 13.2(A)  ?Microalbumin mg/dL - - - - -  ?Micro/Creat Ratio <30 mcg/mg creat - - - - -  ?Chol mg/dL - CANCELED 200 - 172  ?HDL - - CANCELED 56 - 51  ?Calc LDL mg/dL - - 129 - 95  ?Triglycerides - - CANCELED 85 - 151(H)  ?Creatinine - - CANCELED 0.78 0.80 0.95  ? ?BP/Weight 05/13/2021 04/15/2021 04/14/2021 02/26/2021 02/03/2021 01/20/2021 12/22/2020  ?Systolic BP 967 289 791 504 147 134 167  ?Diastolic BP 80 70 84 78 76 77 90  ?Wt. (Lbs) 195.04 194 188.12 192 192 193.8 192.04  ?BMI 36.85 35.48 34.41 34.01 34.56 34.88 34.56  ? ?Foot/eye exam completion dates Latest Ref Rng & Units 12/04/2018 02/14/2018  ?Eye Exam No Retinopathy - -  ?Foot exam Order - - -  ?Foot Form Completion - Done Done  ? ? ? ? ? ?

## 2021-05-17 ENCOUNTER — Encounter: Payer: Self-pay | Admitting: Nurse Practitioner

## 2021-05-17 ENCOUNTER — Other Ambulatory Visit: Payer: Self-pay

## 2021-05-17 ENCOUNTER — Ambulatory Visit: Payer: Medicare Other | Admitting: Nurse Practitioner

## 2021-05-17 VITALS — BP 123/75 | HR 87 | Ht 61.0 in | Wt 197.0 lb

## 2021-05-17 DIAGNOSIS — I10 Essential (primary) hypertension: Secondary | ICD-10-CM | POA: Diagnosis not present

## 2021-05-17 DIAGNOSIS — E782 Mixed hyperlipidemia: Secondary | ICD-10-CM

## 2021-05-17 DIAGNOSIS — E1159 Type 2 diabetes mellitus with other circulatory complications: Secondary | ICD-10-CM | POA: Diagnosis not present

## 2021-05-17 NOTE — Patient Instructions (Signed)
Diabetes Mellitus Emergency Preparedness Plan ?A diabetes emergency preparedness plan is a checklist to make sure you have everything you need to manage your diabetes in case of an emergency, such as an evacuation, natural disaster, national security emergency, or pandemic lockdown. ?Managing your diabetes is something you have to do all day every day. The American Diabetes Association and the American College of Endocrinology both recommend putting together an emergency diabetes kit. Your kit should include important information and documents as well as all the supplies you will need to manage your diabetes for at least 1 week. Store it in a portable, waterproof bag or container. The best time to start making your emergency kit is now. ?How to make your emergency kit ?Collect information and documents ?Include the following information and documents in your kit: ?The type of diabetes you have. ?A copy of your health insurance cards and photo ID. ?A list of all your other medical conditions, allergies, and surgeries. ?A list of all your medicines and doses with the contact information for your pharmacy. Ask your health care provider for a list of your current medicines. ?Any recent lab results, including your latest hemoglobin A1C (HbA1C). ?The make, model, and serial number of your insulin pump, if you use one. Also include contact information for the manufacturer. ?Contact information for people who should be notified in case of an emergency. Include your health care provider's name, address, and phone number. ?Collect diabetes care items ?Include the following diabetes care items in your kit: ?At least a 1-week supply of: ?Oral medicines. ?Insulin. ?Blood glucose testing supplies. These include testing strips, lancets, and extra batteries for your blood glucose monitor and pump. ?A charger for the continuous glucose monitor (CGM) receiver and pump. ?Any extra supplies needed for your CGM or pump. ?A supply of  glucagon, glucose tablets, juice, soda, or hard candy in case of hypoglycemia. ?Coolers or cold packs. ?A safe container for syringes, needles, and lancets. ? ?Other preparations ?Other things to consider doing as part of your emergency plan: ?Make sure that your mobile phone is charged and that you have an extra charger, cable, or batteries. ?Choose a meeting place for family members. ?Wear a medical alert or ID bracelet. ?If you have a child with diabetes, make sure your child's school has a copy of his or her emergency plan, including the name of the staff member who will assist your child. ?Where to find more information ?American Diabetes Association: www.diabetes.org ?Centers for Disease Control and Prevention: blogs.cdc.gov ?Summary ?A diabetes emergency preparedness plan is a checklist to make sure you have everything you need in case of an emergency. ?Your kit should include important information and documents as well as all the supplies you will need to manage your condition for at least 1 week. ?Store your kit in a portable, waterproof bag or container. ?The best time to start making your emergency kit is now. ?This information is not intended to replace advice given to you by your health care provider. Make sure you discuss any questions you have with your health care provider. ?Document Revised: 08/29/2019 Document Reviewed: 08/29/2019 ?Elsevier Patient Education ? 2022 [employer] ? ?

## 2021-05-17 NOTE — Progress Notes (Signed)
05/17/2021, 3:59 PM  Endocrinology follow-up note  Subjective:    Patient ID: Tiffany Jacobs, female    DOB: 08/16/1945.  Tiffany Jacobs is being seen for follow-up of currently uncontrolled type 2 diabetes. PMD:  Fayrene Helper, MD.   Past Medical History:  Diagnosis Date   Arthritis    Asthma 1963   Chest pain with high risk for cardiac etiology 08/21/2017   Diabetes ([place])    Diabetes mellitus, type 2 ([place]) 2004   GERD (gastroesophageal reflux disease) 2004   Hypertension 2004   Low back pain    Obesity    RAD (reactive airway disease)    Sciatica    Seasonal allergies     Past Surgical History:  Procedure Laterality Date   ACROMIO-CLAVICULAR JOINT REPAIR Right 11/09/2012   Procedure: ACROMIO-CLAVICULAR JOINT REPAIR;  Surgeon: Carole Civil, MD;  Location: [facility];  Service: Orthopedics;  Laterality: Right;   COLONOSCOPY N/A 10/10/2016   Procedure: COLONOSCOPY;  Surgeon: Danie Binder, MD;  Location: [facility];  Service: Endoscopy;  Laterality: N/A;  10:30   EYE SURGERY Right 05/09/2018   cataract   left breast biopsy for benign disease     SHOULDER ACROMIOPLASTY Right 11/09/2012   Procedure: RIGHT SHOULDER ACROMIOPLASTY;  Surgeon: Carole Civil, MD;  Location: [facility];  Service: Orthopedics;  Laterality: Right;   SHOULDER OPEN ROTATOR CUFF REPAIR Right 11/09/2012   Procedure: ROTATOR CUFF REPAIR Right SHOULDER OPEN;  Surgeon: Carole Civil, MD;  Location: [facility];  Service: Orthopedics;  Laterality: Right;   total knee arthroplasty left  02-01-05   Dr. Aline Brochure   TUBAL LIGATION  1970    Social History   Socioeconomic History   Marital status: Married    Spouse name: Jeneen Rinks    Number of children: 7   Years of education: 8   Highest education level: GED or equivalent  Occupational History   Occupation: disabled   Tobacco Use   Smoking status: Former    Packs/day: 0.50    Years: 37.00    Pack years: 18.50     Types: Cigarettes    Quit date: 10/12/1988    Years since quitting: 32.6   Smokeless tobacco: Never  Vaping Use   Vaping Use: Never used  Substance and Sexual Activity   Alcohol use: No   Drug use: No   Sexual activity: Not Currently    Birth control/protection: Post-menopausal  Other Topics Concern   Not on file  Social History Narrative   Not on file   Social Determinants of Health   Financial Resource Strain: Low Risk    Difficulty of Paying Living Expenses: Not hard at all  Food Insecurity: No Food Insecurity   Worried About Charity fundraiser in the Last Year: Never true   [place] in the Last Year: Never true  Transportation Needs: No Transportation Needs   Lack of Transportation (Medical): No   Lack of Transportation (Non-Medical): No  Physical Activity: Sufficiently Active   Days of Exercise per Week: 7 days   Minutes of Exercise per Session: 60 min  Stress: Not on file  Social Connections: Socially Isolated   Frequency of Communication with Friends and Family: More than three times a week   Frequency of Social Gatherings with Friends and Family: More than three times a week   Attends Religious Services: Never   Marine scientist or Organizations:  No   Attends Archivist Meetings: Never   Marital Status: Widowed    Family History  Problem Relation Age of Onset   Kidney failure Father    Kidney disease Father    Stroke Brother 39       used drugs , stroke and heart disease   Diabetes Brother    Hypertension Brother    Cancer Daughter 108       breast    Diabetes Mother    Asthma Other    Lung disease Other    Cancer Other    Heart disease Other    Arthritis Other    Colon cancer Neg Hx    Colon polyps Neg Hx     Outpatient Encounter Medications as of 05/17/2021  Medication Sig   albuterol (PROVENTIL) (2.5 MG/3ML) 0.083% nebulizer solution Take 2.5 mg by nebulization every 6 (six) hours as needed for wheezing or shortness of  breath.    albuterol (VENTOLIN HFA) 108 (90 Base) MCG/ACT inhaler Inhale 2 puffs into the lungs every 6 (six) hours as needed for wheezing or shortness of breath.   Alcohol Swabs (B-D SINGLE USE SWABS REGULAR) PADS Use to test as directed   amlodipine-atorvastatin (CADUET) 10-20 MG tablet Take 1 tablet by mouth daily.   blood glucose meter kit and supplies Two times daily testing (dispense meter based on insurance preference) dx e11.65   gabapentin (NEURONTIN) 300 MG capsule Take 1 capsule (300 mg total) by mouth at bedtime.   glipiZIDE (GLUCOTROL XL) 10 MG 24 hr tablet Take 10 mg by mouth daily with breakfast.   glucose blood test strip Use as instructed once daily dx E11.9   hydrALAZINE (APRESOLINE) 25 MG tablet Take TWO tablets by mouth three times daily for blood pressure   insulin glargine, 2 Unit Dial, (TOUJEO MAX SOLOSTAR) 300 UNIT/ML Solostar Pen Inject 60 Units into the skin at bedtime.   Insulin Pen Needle 31G X 5 MM MISC To be used with Lantus insulin pen. Dispensed based on patient's insurance coverage. Please make sure these fit on the patients lantus pen   latanoprost (XALATAN) 0.005 % ophthalmic solution SMARTSIG:1 Drop(s) In Eye(s) Every Evening   metoprolol tartrate (LOPRESSOR) 25 MG tablet Take 1 tablet (25 mg total) by mouth 2 (two) times daily.   montelukast (SINGULAIR) 10 MG tablet Take 1 tablet (10 mg total) by mouth at bedtime.   nitroGLYCERIN (NITROSTAT) 0.4 MG SL tablet DISSOLVE ONE TABLET UNDER THE TONGUE EVERY 5 MINUTES AS NEEDED FOR CHEST PAIN. DO NOT EXCEED A TOTAL OF 3 DOSES IN 15 MINUTES   pantoprazole (PROTONIX) 20 MG tablet Take 1 tablet (20 mg total) by mouth daily.   Semaglutide,0.25 or 0.5MG/DOS, (OZEMPIC, 0.25 OR 0.5 MG/DOSE,) 2 MG/1.5ML SOPN Inject 0.5 mg into the skin once a week.   spironolactone (ALDACTONE) 25 MG tablet Take 1 tablet (25 mg total) by mouth daily.   TRUEplus Lancets 33G MISC TEST BLOOD SUGAR THREE TIMES DAILY   No facility-administered  encounter medications on file as of 05/17/2021.    ALLERGIES: Allergies  Allergen Reactions   Ace Inhibitors Cough   Latex Itching and Rash    VACCINATION STATUS: Immunization History  Administered Date(s) Administered   Fluad Quad(high Dose 65+) 12/04/2018, 12/18/2019, 12/22/2020   Influenza Whole 03/29/2006, 11/27/2009   Influenza,inj,Quad PF,6+ Mos 03/20/2013, 01/24/2014, 12/25/2014, 01/22/2016, 10/31/2016, 01/31/2017, 10/19/2017   Pneumococcal Conjugate-13 06/06/2014   Pneumococcal Polysaccharide-23 08/27/2004, 11/27/2009, 08/27/2015   Td 08/27/2004    Diabetes  She presents for her follow-up diabetic visit. She has type 2 diabetes mellitus. Onset time: She was diagnosed at approximate age of 77. Her disease course has been improving. Pertinent negatives for hypoglycemia include no confusion, headaches, hunger, pallor or seizures. Associated symptoms include blurred vision, fatigue, polydipsia and polyuria. Pertinent negatives for diabetes include no chest pain and no polyphagia. There are no hypoglycemic complications. Symptoms are improving. Diabetic complications include nephropathy. Risk factors for coronary artery disease include dyslipidemia, diabetes mellitus, obesity, sedentary lifestyle, post-menopausal, family history and hypertension. Current diabetic treatment includes insulin injections and oral agent (monotherapy) (and Ozempic). She is compliant with treatment most of the time. Her weight is increasing steadily. She is following a generally healthy diet. When asked about meal planning, she reported none. She has had a previous visit with a dietitian. She never participates in exercise. Her home blood glucose trend is decreasing steadily. Her breakfast blood glucose range is generally 140-180 mg/dl. Her overall blood glucose range is 180-200 mg/dl. (She presents today with her meter, no logs, showing slowly improving glycemic profile overall.  She was not due for another A1c  today.  She is motivated to get her diabetes under control so she can safely have some dental work done.  She has worked hard on her diet.  She also says the Ozempic is too expensive, wants to use up her current supply and stop.  Analysis of her meter shows 7-day average of 177, 14-day average of 181, 30-day average of 185, and 90-day average of 188.  She denies any hypoglycemia.) An ACE inhibitor/angiotensin II receptor blocker is not being taken. Eye exam is current.  Hypertension This is a chronic problem. The current episode started more than 1 year ago. The problem is unchanged. The problem is controlled. Associated symptoms include blurred vision. Pertinent negatives include no chest pain or headaches. There are no associated agents to hypertension. Risk factors for coronary artery disease include diabetes mellitus, dyslipidemia, family history, obesity, post-menopausal state and sedentary lifestyle. Past treatments include diuretics, beta blockers, calcium channel blockers and direct vasodilators. The current treatment provides moderate improvement. Compliance problems include diet and exercise.  Hypertensive end-organ damage includes kidney disease. Identifiable causes of hypertension include chronic renal disease.  Hyperlipidemia This is a chronic problem. The current episode started more than 1 year ago. The problem is controlled. Recent lipid tests were reviewed and are normal. Exacerbating diseases include chronic renal disease, diabetes and obesity. Factors aggravating her hyperlipidemia include fatty foods and beta blockers. Pertinent negatives include no chest pain. Current antihyperlipidemic treatment includes statins. The current treatment provides moderate improvement of lipids. Compliance problems include adherence to diet and adherence to exercise.  Risk factors for coronary artery disease include diabetes mellitus, dyslipidemia, family history, obesity, hypertension, post-menopausal and a  sedentary lifestyle.   Review of systems  Constitutional: + Minimally fluctuating body weight,  current Body mass index is 37.22 kg/m. , + fatigue, no subjective hyperthermia, no subjective hypothermia Eyes: + blurry vision, no xerophthalmia ENT: no sore throat, no nodules palpated in throat, no dysphagia/odynophagia, no hoarseness Cardiovascular: no chest pain, no shortness of breath, no palpitations, no leg swelling Respiratory: no cough, no shortness of breath Gastrointestinal: no nausea/vomiting/diarrhea Musculoskeletal: no muscle/joint aches Skin: no rashes, no hyperemia Neurological: no tremors, no numbness, no tingling, no dizziness Psychiatric: no depression, no anxiety    Objective:    BP 123/75    Pulse 87    Ht _0  (1.549 m)    Wt  197 lb (89.4 kg)    SpO2 97%    BMI 37.22 kg/m   Wt Readings from Last 3 Encounters:  05/17/21 197 lb (89.4 kg)  05/13/21 195 lb 0.6 oz (88.5 kg)  04/15/21 194 lb (88 kg)    BP Readings from Last 3 Encounters:  05/17/21 123/75  05/13/21 130/80  04/15/21 (!) 158/70     Physical Exam- Limited  Constitutional:  Body mass index is 37.22 kg/m. , not in acute distress, ? Memory impairment Eyes:  EOMI, no exophthalmos Neck: Supple Cardiovascular: RRR, no murmurs, rubs, or gallops, no edema Respiratory: Adequate breathing efforts, no crackles, rales, rhonchi, or wheezing Musculoskeletal: no gross deformities, strength intact in all four extremities, no gross restriction of joint movements Skin:  no rashes, no hyperemia Neurological: no tremor with outstretched hands     CMP     Component Value Date/Time   NA CANCELED 04/14/2021 1405   K CANCELED 04/14/2021 1405   CL CANCELED 04/14/2021 1405   CO2 CANCELED 04/14/2021 1405   GLUCOSE CANCELED 04/14/2021 1405   GLUCOSE 178 (H) 05/21/2019 0929   BUN CANCELED 04/14/2021 1405   CREATININE CANCELED 04/14/2021 1405   CREATININE 0.73 05/21/2019 0929   CALCIUM CANCELED 04/14/2021 1405    PROT CANCELED 04/14/2021 1405   ALBUMIN CANCELED 04/14/2021 1405   AST CANCELED 04/14/2021 1405   ALT CANCELED 04/14/2021 1405   ALKPHOS CANCELED 04/14/2021 1405   BILITOT CANCELED 04/14/2021 1405   GFRNONAA 76 12/12/2019 1050   GFRNONAA 82 05/21/2019 0929   GFRAA 88 12/12/2019 1050   GFRAA 95 05/21/2019 0929     Diabetic Labs (most recent): Lab Results  Component Value Date   HGBA1C 12 04/15/2021   HGBA1C 14.6 (H) 12/22/2020   HGBA1C 13.2 (A) 08/13/2020     Lipid Panel ( most recent) Lipid Panel     Component Value Date/Time   CHOL CANCELED 04/14/2021 1405   TRIG CANCELED 04/14/2021 1405   HDL CANCELED 04/14/2021 1405   CHOLHDL 3.6 04/14/2021 0000   CHOLHDL 3.0 05/21/2019 0929   VLDL 20 07/22/2016 1004   LDLCALC 129 04/14/2021 0000   LDLCALC 107 (H) 05/21/2019 0929      Lab Results  Component Value Date   TSH 1.730 08/13/2020   TSH 2.58 05/21/2019   TSH 1.71 01/31/2017   TSH 2.75 05/26/2015   TSH 3.261 06/02/2014   TSH 3.039 05/07/2013   TSH 3.308 10/10/2011   TSH 2.811 03/19/2010   TSH 3.422 08/12/2009   FREET4 0.9 05/21/2019       Assessment & Plan:   1) Type 2 diabetes mellitus with vascular disease ([place])  - Tiffany Jacobs has currently uncontrolled symptomatic type 2 DM since  76 years of age.  She presents today with her meter, no logs, showing slowly improving glycemic profile overall.  She was not due for another A1c today.  She is motivated to get her diabetes under control so she can safely have some dental work done.  She has worked hard on her diet.  She also says the Ozempic is too expensive, wants to use up her current supply and stop.  Analysis of her meter shows 7-day average of 177, 14-day average of 181, 30-day average of 185, and 90-day average of 188.  She denies any hypoglycemia.  Recent labs reviewed.  -her diabetes is complicated by obesity/sedentary life and she remains at a high risk for more acute and chronic  complications which include CAD, CVA, CKD, retinopathy,  and neuropathy. These are all discussed in detail with her.  - Nutritional counseling repeated at each appointment due to patients tendency to fall back in to old habits.  - The patient admits there is a room for improvement in their diet and drink choices. -  Suggestion is made for the patient to avoid simple carbohydrates from their diet including Cakes, Sweet Desserts / Pastries, Ice Cream, Soda (diet and regular), Sweet Tea, Candies, Chips, Cookies, Sweet Pastries, Store Bought Juices, Alcohol in Excess of 1-2 drinks a day, Artificial Sweeteners, Coffee Creamer, and "Sugar-free" Products. This will help patient to have stable blood glucose profile and potentially avoid unintended weight gain.   - I encouraged the patient to switch to unprocessed or minimally processed complex starch and increased protein intake (animal or plant source), fruits, and vegetables.   - Patient is advised to stick to a routine mealtimes to eat 3 meals a day and avoid unnecessary snacks (to snack only to correct hypoglycemia).  - she is following with Jearld Fenton, RDN, CDE for individualized diabetes education.  - I have approached her with the following individualized plan to manage diabetes and patient agrees:   -Given her improved glycemic profile, she is advised to continue her Toujeo 60 units SQ with breakfast (cannot remember to take it at night), continue her Glipizide 10 mg XL daily with breakfast and increase her Ozempic to 0.5 mg SQ weekly (has still only been taking 0.25 mg and has skipped several doses as she forgot to take it).  Once she depletes her supply of Ozempic, will discontinue it due to price.  She says she will do better to remember that injection by setting an alarm on her phone.   -She is encouraged to continue monitoring blood glucose twice daily, before breakfast and before bed, and to call the clinic if she has readings less than 70  or above 300 for 3 tests in a row.   - she is warned not to take insulin without proper monitoring per orders.  - Patient specific target  A1c;  LDL, HDL, Triglycerides, and  Waist Circumference were discussed in detail.  2) Blood Pressure /Hypertension:  Her blood pressure is controlled to target for her age.  she is advised to continue her current medications including Amlodipine 10 mg po daily, Spironolactone 25 mg po daily, Hydralazine 25 mg po daily, and Metoprolol 25 mg po daily.  3) Lipids/Hyperlipidemia:    Her most recent lipid panel from 08/13/20 shows controlled LDL of 95 and elevated triglycerides of 151.   she is advised to continue Atorvastatin 20 mg p.o. daily at bedtime.  Side effects and precautions discussed with her.  Lipid panel to be checked by PCP.  4)  Weight/Diet:  Her Body mass index is 37.22 kg/m.-   clearly complicating her diabetes care.  I discussed with her the fact that loss of 5 - 10% of her  current body weight will have the most impact on her diabetes management.  CDE Consult will be initiated . Exercise, and detailed carbohydrates information provided  -  detailed on discharge instructions.  5) Chronic Care/Health Maintenance: -she is on Statin medications and  is encouraged to initiate and continue to follow up with Ophthalmology, Dentist,  Podiatrist at least yearly or according to recommendations, and advised to stay away from smoking. I have recommended yearly flu vaccine and pneumonia vaccine at least every 5 years; moderate intensity exercise for up to 150 minutes weekly; and  sleep  for at least 7 hours a day.  - she is advised to maintain close follow up with Fayrene Helper, MD for primary care needs, as well as her other providers for optimal and coordinated care.     I spent 30 minutes in the care of the patient today including review of labs from [place], Lipids, Thyroid Function, Hematology (current and previous including abstractions from other  facilities); face-to-face time discussing  her blood glucose readings/logs, discussing hypoglycemia and hyperglycemia episodes and symptoms, medications doses, her options of short and long term treatment based on the latest standards of care / guidelines;  discussion about incorporating lifestyle medicine;  and documenting the encounter.    Please refer to Patient Instructions for Blood Glucose Monitoring and Insulin/Medications Dosing Guide"  in media tab for additional information. Please  also refer to " Patient Self Inventory" in the Media  tab for reviewed elements of pertinent patient history.  Tiffany Jacobs participated in the discussions, expressed understanding, and voiced agreement with the above plans.  All questions were answered to her satisfaction. she is encouraged to contact clinic should she have any questions or concerns prior to her return visit.   Follow up plan: - Return in about 2 months (around 07/17/2021) for Diabetes F/U with A1c in office, No previsit labs, Bring meter and logs.  Rayetta Pigg, [facility] [address] [place], [place] [address] Phone: [phone] Fax: [phone]  05/17/2021, 3:59 PM

## 2021-05-24 ENCOUNTER — Telehealth: Payer: Self-pay

## 2021-05-24 ENCOUNTER — Other Ambulatory Visit: Payer: Self-pay

## 2021-05-24 DIAGNOSIS — J45991 Cough variant asthma: Secondary | ICD-10-CM

## 2021-05-24 NOTE — Telephone Encounter (Signed)
Rx sent to [facility] for nebulizer machine ?

## 2021-05-24 NOTE — Telephone Encounter (Signed)
Pt called in to have a new prescription for a nebulizer to be sent to the pharmacy. Pt states that hers does not work anymore and won't turn on.  ?

## 2021-05-27 DIAGNOSIS — H401112 Primary open-angle glaucoma, right eye, moderate stage: Secondary | ICD-10-CM | POA: Diagnosis not present

## 2021-06-22 ENCOUNTER — Ambulatory Visit: Payer: Medicare HMO | Admitting: Family Medicine

## 2021-06-24 DIAGNOSIS — H401112 Primary open-angle glaucoma, right eye, moderate stage: Secondary | ICD-10-CM | POA: Diagnosis not present

## 2021-06-24 DIAGNOSIS — H401121 Primary open-angle glaucoma, left eye, mild stage: Secondary | ICD-10-CM | POA: Diagnosis not present

## 2021-07-13 ENCOUNTER — Ambulatory Visit: Payer: Medicare Other | Admitting: Family Medicine

## 2021-07-19 ENCOUNTER — Ambulatory Visit: Payer: Medicare Other | Admitting: Nurse Practitioner

## 2021-07-19 NOTE — Patient Instructions (Incomplete)
Diabetes Mellitus and Foot Care Foot care is an important part of your health, especially when you have diabetes. Diabetes may cause you to have problems because of poor blood flow (circulation) to your feet and legs, which can cause your skin to: Become thinner and drier. Break more easily. Heal more slowly. Peel and crack. You may also have nerve damage (neuropathy) in your legs and feet, causing decreased feeling in them. This means that you may not notice minor injuries to your feet that could lead to more serious problems. Noticing and addressing any potential problems early is the best way to prevent future foot problems. How to care for your feet Foot hygiene  Wash your feet daily with warm water and mild soap. Do not use hot water. Then, pat your feet and the areas between your toes until they are completely dry. Do not soak your feet as this can dry your skin. Trim your toenails straight across. Do not dig under them or around the cuticle. File the edges of your nails with an emery board or nail file. Apply a moisturizing lotion or petroleum jelly to the skin on your feet and to dry, brittle toenails. Use lotion that does not contain alcohol and is unscented. Do not apply lotion between your toes. Shoes and socks Wear clean socks or stockings every day. Make sure they are not too tight. Do not wear knee-high stockings since they may decrease blood flow to your legs. Wear shoes that fit properly and have enough cushioning. Always look in your shoes before you put them on to be sure there are no objects inside. To break in new shoes, wear them for just a few hours a day. This prevents injuries on your feet. Wounds, scrapes, corns, and calluses  Check your feet daily for blisters, cuts, bruises, sores, and redness. If you cannot see the bottom of your feet, use a mirror or ask someone for help. Do not cut corns or calluses or try to remove them with medicine. If you find a minor scrape,  cut, or break in the skin on your feet, keep it and the skin around it clean and dry. You may clean these areas with mild soap and water. Do not clean the area with peroxide, alcohol, or iodine. If you have a wound, scrape, corn, or callus on your foot, look at it several times a day to make sure it is healing and not infected. Check for: Redness, swelling, or pain. Fluid or blood. Warmth. Pus or a bad smell. General tips Do not cross your legs. This may decrease blood flow to your feet. Do not use heating pads or hot water bottles on your feet. They may burn your skin. If you have lost feeling in your feet or legs, you may not know this is happening until it is too late. Protect your feet from hot and cold by wearing shoes, such as at the beach or on hot pavement. Schedule a complete foot exam at least once a year (annually) or more often if you have foot problems. Report any cuts, sores, or bruises to your health care provider immediately. Where to find more information American Diabetes Association: www.diabetes.org Association of Diabetes Care & Education Specialists: www.diabeteseducator.org Contact a health care provider if: You have a medical condition that increases your risk of infection and you have any cuts, sores, or bruises on your feet. You have an injury that is not healing. You have redness on your legs or feet. You   feel burning or tingling in your legs or feet. You have pain or cramps in your legs and feet. Your legs or feet are numb. Your feet always feel cold. You have pain around any toenails. Get help right away if: You have a wound, scrape, corn, or callus on your foot and: You have pain, swelling, or redness that gets worse. You have fluid or blood coming from the wound, scrape, corn, or callus. Your wound, scrape, corn, or callus feels warm to the touch. You have pus or a bad smell coming from the wound, scrape, corn, or callus. You have a fever. You have a red  line going up your leg. Summary Check your feet every day for blisters, cuts, bruises, sores, and redness. Apply a moisturizing lotion or petroleum jelly to the skin on your feet and to dry, brittle toenails. Wear shoes that fit properly and have enough cushioning. If you have foot problems, report any cuts, sores, or bruises to your health care provider immediately. Schedule a complete foot exam at least once a year (annually) or more often if you have foot problems. This information is not intended to replace advice given to you by your health care provider. Make sure you discuss any questions you have with your health care provider. Document Revised: 09/12/2019 Document Reviewed: 09/12/2019 Elsevier Patient Education  2023 [employer]  

## 2021-07-20 ENCOUNTER — Ambulatory Visit: Payer: Self-pay | Admitting: Family Medicine

## 2021-08-05 ENCOUNTER — Ambulatory Visit (INDEPENDENT_AMBULATORY_CARE_PROVIDER_SITE_OTHER): Payer: Medicare Other | Admitting: Family Medicine

## 2021-08-05 ENCOUNTER — Encounter: Payer: Self-pay | Admitting: Family Medicine

## 2021-08-05 VITALS — BP 156/77 | HR 101 | Resp 16 | Ht 61.5 in | Wt 193.1 lb

## 2021-08-05 DIAGNOSIS — I10 Essential (primary) hypertension: Secondary | ICD-10-CM | POA: Diagnosis not present

## 2021-08-05 DIAGNOSIS — E782 Mixed hyperlipidemia: Secondary | ICD-10-CM | POA: Diagnosis not present

## 2021-08-05 DIAGNOSIS — J45991 Cough variant asthma: Secondary | ICD-10-CM

## 2021-08-05 DIAGNOSIS — E1159 Type 2 diabetes mellitus with other circulatory complications: Secondary | ICD-10-CM

## 2021-08-05 LAB — POCT GLYCOSYLATED HEMOGLOBIN (HGB A1C): HbA1c, POC (controlled diabetic range): 8.9 % — AB (ref 0.0–7.0)

## 2021-08-05 MED ORDER — OZEMPIC (0.25 OR 0.5 MG/DOSE) 2 MG/3ML ~~LOC~~ SOPN
0.5000 mg | PEN_INJECTOR | SUBCUTANEOUS | 1 refills | Status: DC
Start: 2021-08-05 — End: 2021-08-05

## 2021-08-05 MED ORDER — SPIRONOLACTONE 25 MG PO TABS: 25.0000 mg | ORAL_TABLET | Freq: Every day | ORAL | 1 refills | Status: DC

## 2021-08-05 MED ORDER — OZEMPIC (0.25 OR 0.5 MG/DOSE) 2 MG/3ML ~~LOC~~ SOPN: 0.5000 mg | PEN_INJECTOR | SUBCUTANEOUS | 4 refills | Status: DC

## 2021-08-05 MED ORDER — HYDRALAZINE HCL 50 MG PO TABS: ORAL_TABLET | ORAL | 5 refills | Status: DC

## 2021-08-05 MED ORDER — ALBUTEROL SULFATE (2.5 MG/3ML) 0.083% IN NEBU
2.5000 mg | INHALATION_SOLUTION | Freq: Four times a day (QID) | RESPIRATORY_TRACT | 5 refills | Status: AC | PRN
Start: 2021-08-05 — End: ?

## 2021-08-05 MED ORDER — AMLODIPINE-ATORVASTATIN 10-20 MG PO TABS: 1.0000 | ORAL_TABLET | Freq: Every day | ORAL | 3 refills | Status: DC

## 2021-08-05 MED ORDER — METOPROLOL TARTRATE 25 MG PO TABS: 25.0000 mg | ORAL_TABLET | Freq: Two times a day (BID) | ORAL | 3 refills | Status: DC

## 2021-08-05 MED ORDER — ALBUTEROL SULFATE HFA 108 (90 BASE) MCG/ACT IN AERS: 2.0000 | INHALATION_SPRAY | Freq: Four times a day (QID) | RESPIRATORY_TRACT | 0 refills | Status: DC | PRN

## 2021-08-05 NOTE — Patient Instructions (Addendum)
F/u in 4 to 5 weeks with blood sugar log and medications, call if you need me sooner  CONGRATS on improved blood sugar, continue healthy food choice change  For blood pressure caduet one daily, hydralazine 50 mg one and a half twice daily like 10 am and 10 pm, metoprolol twice daily , and spironolactone one daily  For diabetes , 60 units insulin every evening, glipizide 10 mg one daily and ozempic 0.5 mg weekly  Test and record blood sugar at least twice daily, just before your first meal and last thing at nigh, roughly 11 am and 10 pm  Commit to 64 oz water daily  Nurse pls send insulin to local pharmacy, also refill her neb solution at local pharmacy , may also need a neb machine ,please check with patient alll that she needs! Using local pharmacy, has ben on ozempic and is reporting bill from mail order for over $1000 for it, I told her fill meds locally so she knows the cost Thanks for choosing [facility], we consider it a privelige to serve you.

## 2021-08-08 ENCOUNTER — Encounter: Payer: Self-pay | Admitting: Family Medicine

## 2021-08-08 NOTE — Assessment & Plan Note (Signed)
UnControlled, no change in medication, compliance  DASH diet and commitment to daily physical activity for a minimum of 30 minutes discussed and encouraged, as a part of hypertension management. The importance of attaining a healthy weight is also discussed.     08/05/2021    2:48 PM 05/17/2021    3:34 PM 05/13/2021    1:26 PM 05/13/2021    1:08 PM 04/15/2021    2:30 PM 04/14/2021    1:16 PM 02/26/2021    3:49 PM  BP/Weight  Systolic BP 784 696 295 284 132 440 102  Diastolic BP 77 75 80 87 70 84 78  Wt. (Lbs) 193.12 197  195.04 194 188.12   BMI 35.9 kg/m2 37.22 kg/m2  36.85 kg/m2 35.48 kg/m2 34.41 kg/m2

## 2021-08-08 NOTE — Assessment & Plan Note (Signed)
Neb machine and neb solutiuon and inhalers prescribed

## 2021-08-08 NOTE — Assessment & Plan Note (Signed)
Hyperlipidemia:Low fat diet discussed and encouraged.   Lipid Panel  Lab Results  Component Value Date   CHOL CANCELED 04/14/2021   HDL CANCELED 04/14/2021   LDLCALC 129 04/14/2021   TRIG CANCELED 04/14/2021   CHOLHDL 3.6 04/14/2021  needs to lower fat intake

## 2021-08-08 NOTE — Progress Notes (Signed)
Tiffany Jacobs     MRN: [id number]      DOB: 05-29-1945   HPI Tiffany Jacobs is here for follow up and re-evaluation of chronic medical conditions, medication management and review of any available recent lab and radiology data.  Preventive health is updated, specifically  Cancer screening and Immunization.   Questions or concerns regarding consultations or procedures which the PT has had in the interim are  addressed.States she wants diabetes managed by PCP, and does not want to go to Endo, states she is going to work harder on her diabetes The PT denies any adverse reactions to current medications since the last visit.  Still waiting on diabetes to be controlled for dental work, disappointed that despite change in diet , her blood sugar seems to be high  ROS Denies recent fever or chills. Denies sinus pressure, nasal congestion, ear pain or sore throat. Denies chest congestion, productive cough or wheezing. Denies chest pains, palpitations and leg swelling Denies abdominal pain, nausea, vomiting,diarrhea or constipation.   Denies dysuria, frequency, hesitancy or incontinence. Denies uncontrolled  joint pain, swelling and limitation in mobility. Denies headaches, seizures, numbness, or tingling. Denies depression, anxiety or insomnia. Denies skin break down or rash.   PE  BP (!) 156/77   Pulse (!) 101   Resp 16   Ht 5' 1.5" (1.562 m)   Wt 193 lb 1.9 oz (87.6 kg)   SpO2 92%   BMI 35.90 kg/m   Patient alert and oriented and in no cardiopulmonary distress.  HEENT: No facial asymmetry, EOMI,     Neck supple .  Chest: Clear to auscultation bilaterally.  CVS: S1, S2 no murmurs, no S3.Regular rate.  ABD: Soft non tender.   Ext: No edema  MS: Adequate ROM spine, shoulders, hips and knees.  Skin: Intact, no ulcerations or rash noted.  Psych: Good eye contact, normal affect. Memory intact not anxious or depressed appearing.  CNS: CN 2-12 intact, power,  normal  throughout.no focal deficits noted.   Assessment & Plan  Primary hypertension UnControlled, no change in medication, compliance  DASH diet and commitment to daily physical activity for a minimum of 30 minutes discussed and encouraged, as a part of hypertension management. The importance of attaining a healthy weight is also discussed.     08/05/2021    2:48 PM 05/17/2021    3:34 PM 05/13/2021    1:26 PM 05/13/2021    1:08 PM 04/15/2021    2:30 PM 04/14/2021    1:16 PM 02/26/2021    3:49 PM  BP/Weight  Systolic BP 403 474 259 563 875 643 329  Diastolic BP 77 75 80 87 70 84 78  Wt. (Lbs) 193.12 197  195.04 194 188.12   BMI 35.9 kg/m2 37.22 kg/m2  36.85 kg/m2 35.48 kg/m2 34.41 kg/m2        Mixed hyperlipidemia Hyperlipidemia:Low fat diet discussed and encouraged.   Lipid Panel  Lab Results  Component Value Date   CHOL CANCELED 04/14/2021   HDL CANCELED 04/14/2021   LDLCALC 129 04/14/2021   TRIG CANCELED 04/14/2021   CHOLHDL 3.6 04/14/2021  needs to lower fat intake     Type 2 diabetes mellitus with vascular disease ([place]) Improved Tiffany Jacobs is reminded of the importance of commitment to daily physical activity for 30 minutes or more, as able and the need to limit carbohydrate intake to 30 to 60 grams per meal to help with blood sugar control.   The need to take  medication as prescribed, test blood sugar as directed, and to call between visits if there is a concern that blood sugar is uncontrolled is also discussed.   Tiffany Jacobs is reminded of the importance of daily foot exam, annual eye examination, and good blood sugar, blood pressure and cholesterol control.     Latest Ref Rng & Units 08/05/2021    3:50 PM 04/15/2021    2:41 PM 04/14/2021    2:05 PM 04/14/2021   12:00 AM 12/22/2020    4:02 PM  Diabetic Labs  HbA1c 0.0 - 7.0 % 8.9   12     14.6    Chol mg/dL   CANCELED   200     HDL    CANCELED   56     Calc LDL mg/dL    129     Triglycerides    CANCELED   85      Creatinine    CANCELED   0.78   0.80        08/05/2021    2:48 PM 05/17/2021    3:34 PM 05/13/2021    1:26 PM 05/13/2021    1:08 PM 04/15/2021    2:30 PM 04/14/2021    1:16 PM 02/26/2021    3:49 PM  BP/Weight  Systolic BP 035 597 416 384 536 468 032  Diastolic BP 77 75 80 87 70 84 78  Wt. (Lbs) 193.12 197  195.04 194 188.12   BMI 35.9 kg/m2 37.22 kg/m2  36.85 kg/m2 35.48 kg/m2 34.41 kg/m2       12/04/2018    2:20 PM 02/14/2018    2:20 PM  Foot/eye exam completion dates  Foot Form Completion Done Done        Morbid obesity ([place])  Patient re-educated about  the importance of commitment to a  minimum of 150 minutes of exercise per week as able.  The importance of healthy food choices with portion control discussed, as well as eating regularly and within a 12 hour window most days. The need to choose "clean , green" food 50 to 75% of the time is discussed, as well as to make water the primary drink and set a goal of 64 ounces water daily.       08/05/2021    2:48 PM 05/17/2021    3:34 PM 05/13/2021    1:08 PM  Weight /BMI  Weight 193 lb 1.9 oz 197 lb 195 lb 0.6 oz  Height 5' 1.5" (1.562 m) '5\' 1"'$  (1.549 m) '5\' 1"'$  (1.549 m)  BMI 35.9 kg/m2 37.22 kg/m2 36.85 kg/m2      COUGH VARIANT ASTHMA Neb machine and neb solutiuon and inhalers prescribed

## 2021-08-08 NOTE — Assessment & Plan Note (Signed)
  Patient re-educated about  the importance of commitment to a  minimum of 150 minutes of exercise per week as able.  The importance of healthy food choices with portion control discussed, as well as eating regularly and within a 12 hour window most days. The need to choose "clean , green" food 50 to 75% of the time is discussed, as well as to make water the primary drink and set a goal of 64 ounces water daily.       08/05/2021    2:48 PM 05/17/2021    3:34 PM 05/13/2021    1:08 PM  Weight /BMI  Weight 193 lb 1.9 oz 197 lb 195 lb 0.6 oz  Height 5' 1.5" (1.562 m) '5\' 1"'$  (1.549 m) '5\' 1"'$  (1.549 m)  BMI 35.9 kg/m2 37.22 kg/m2 36.85 kg/m2

## 2021-08-08 NOTE — Assessment & Plan Note (Signed)
Improved Tiffany Jacobs is reminded of the importance of commitment to daily physical activity for 30 minutes or more, as able and the need to limit carbohydrate intake to 30 to 60 grams per meal to help with blood sugar control.   The need to take medication as prescribed, test blood sugar as directed, and to call between visits if there is a concern that blood sugar is uncontrolled is also discussed.   Tiffany Jacobs is reminded of the importance of daily foot exam, annual eye examination, and good blood sugar, blood pressure and cholesterol control.     Latest Ref Rng & Units 08/05/2021    3:50 PM 04/15/2021    2:41 PM 04/14/2021    2:05 PM 04/14/2021   12:00 AM 12/22/2020    4:02 PM  Diabetic Labs  HbA1c 0.0 - 7.0 % 8.9   12     14.6    Chol mg/dL   CANCELED   200     HDL    CANCELED   56     Calc LDL mg/dL    129     Triglycerides    CANCELED   85     Creatinine    CANCELED   0.78   0.80        08/05/2021    2:48 PM 05/17/2021    3:34 PM 05/13/2021    1:26 PM 05/13/2021    1:08 PM 04/15/2021    2:30 PM 04/14/2021    1:16 PM 02/26/2021    3:49 PM  BP/Weight  Systolic BP 272 536 644 034 742 595 638  Diastolic BP 77 75 80 87 70 84 78  Wt. (Lbs) 193.12 197  195.04 194 188.12   BMI 35.9 kg/m2 37.22 kg/m2  36.85 kg/m2 35.48 kg/m2 34.41 kg/m2       12/04/2018    2:20 PM 02/14/2018    2:20 PM  Foot/eye exam completion dates  Foot Form Completion Done Done

## 2021-08-09 ENCOUNTER — Telehealth: Payer: Self-pay | Admitting: Family Medicine

## 2021-08-09 NOTE — Telephone Encounter (Signed)
Pt called stating she thought she was supposed to have a nebulizer machine called in?? Can you please look into this?

## 2021-08-10 NOTE — Telephone Encounter (Signed)
This was sent to [facility] and they will contact her before delivery so if she doesn't answer they will not deliver it. I tried to call her, and no answer and no option to leave message

## 2021-09-09 ENCOUNTER — Encounter: Payer: Self-pay | Admitting: Family Medicine

## 2021-09-09 ENCOUNTER — Ambulatory Visit (INDEPENDENT_AMBULATORY_CARE_PROVIDER_SITE_OTHER): Payer: Medicare Other | Admitting: Family Medicine

## 2021-09-09 VITALS — BP 116/65 | HR 83 | Ht 62.5 in | Wt 188.1 lb

## 2021-09-09 DIAGNOSIS — I10 Essential (primary) hypertension: Secondary | ICD-10-CM

## 2021-09-09 DIAGNOSIS — R053 Chronic cough: Secondary | ICD-10-CM

## 2021-09-09 DIAGNOSIS — E66811 Obesity, class 1: Secondary | ICD-10-CM

## 2021-09-09 DIAGNOSIS — E11649 Type 2 diabetes mellitus with hypoglycemia without coma: Secondary | ICD-10-CM

## 2021-09-09 DIAGNOSIS — R2681 Unsteadiness on feet: Secondary | ICD-10-CM | POA: Diagnosis not present

## 2021-09-09 DIAGNOSIS — E669 Obesity, unspecified: Secondary | ICD-10-CM

## 2021-09-09 DIAGNOSIS — E1159 Type 2 diabetes mellitus with other circulatory complications: Secondary | ICD-10-CM

## 2021-09-09 DIAGNOSIS — E782 Mixed hyperlipidemia: Secondary | ICD-10-CM

## 2021-09-09 LAB — GLUCOSE, POCT (MANUAL RESULT ENTRY): POC Glucose: 396 mg/dl — AB (ref 70–99)

## 2021-09-09 MED ORDER — BUDESONIDE-FORMOTEROL FUMARATE 160-4.5 MCG/ACT IN AERO: 2.0000 | INHALATION_SPRAY | Freq: Two times a day (BID) | RESPIRATORY_TRACT | 12 refills | Status: DC

## 2021-09-09 MED ORDER — INSULIN ASPART 100 UNIT/ML IJ SOLN
9.0000 [IU] | Freq: Once | INTRAMUSCULAR | Status: AC
  Administered 2021-09-09: 9 [IU] via SUBCUTANEOUS

## 2021-09-09 NOTE — Patient Instructions (Addendum)
Annual exam end October when due , call if you neeed me sooner  9 units regular insulin gibven in the office today  Please stop cough drops and cough syrup  New is symbicort inhaler use daily to improve breathing and reduce cough  You are referred to Pulmonary Doctor re cough  Handicap sticker to be provided today  You need the Endocrinology  team to manage your blood sugar, I have requested an as soon as possible appointment with them  Blood pressure and cholesterol are excellent  Fasting lipid, cmp and eGFR 5 days before October appointment  Please get shingrix vaccines at your pharmacy  Thanks for choosing [facility], we consider it a privelige to serve you.

## 2021-09-10 ENCOUNTER — Telehealth: Payer: Self-pay | Admitting: Nurse Practitioner

## 2021-09-10 NOTE — Telephone Encounter (Signed)
A duplicate referral was placed for patient to be seen for her Diabetes Management. Patient no showed her last appt on 5/15. Tried to contact patient this morning no answer.

## 2021-09-20 ENCOUNTER — Encounter: Payer: Self-pay | Admitting: Family Medicine

## 2021-09-20 NOTE — Assessment & Plan Note (Signed)
Controlled, no change in medication DASH diet and commitment to daily physical activity for a minimum of 30 minutes discussed and encouraged, as a part of hypertension management. The importance of attaining a healthy weight is also discussed.     09/09/2021    4:13 PM 08/05/2021    2:48 PM 05/17/2021    3:34 PM 05/13/2021    1:26 PM 05/13/2021    1:08 PM 04/15/2021    2:30 PM 04/14/2021    1:16 PM  BP/Weight  Systolic BP 562 130 865 784 696 295 284  Diastolic BP 65 77 75 80 87 70 84  Wt. (Lbs) 188.12 193.12 197  195.04 194 188.12  BMI 33.86 kg/m2 35.9 kg/m2 37.22 kg/m2  36.85 kg/m2 35.48 kg/m2 34.41 kg/m2

## 2021-09-20 NOTE — Progress Notes (Signed)
CATHA ONTKO     MRN: [id number]      DOB: 27-Oct-1945   HPI Tiffany Jacobs is here for follow up and re-evaluation of chronic medical conditions, medication management and review of any available recent lab and radiology data.  Preventive health is updated, specifically  Cancer screening and Immunization.   Blood sugar remains high and uncontrolled despite pt report of best efforts C/o fatigue, poor exercise tolerance and chronic uncontrolled cough, using cough drops " all the time"has no maintainace inhaler currently Requests handicap sticker due primarily to breathing difficulty , also has arthritic problems C/o high blood sugar and fatigue  ROS Denies recent fever or chills. Denies sinus pressure, nasal congestion, ear pain or sore throat. Chronic and worsening dry cough, and shortness of breath Denies chest pains, palpitations and leg swelling Denies abdominal pain, nausea, vomiting,diarrhea or constipation.   Denies dysuria, frequency, hesitancy or incontinence. Chronic joint pain,  and limitation in mobility. Denies headaches, seizures, numbness, or tingling. Denies depression, anxiety or insomnia. Denies skin break down or rash.   PE  BP 116/65   Pulse 83   Ht 5' 2.5" (1.588 m)   Wt 188 lb 1.9 oz (85.3 kg)   SpO2 92%   BMI 33.86 kg/m   Patient alert and oriented and in no cardiopulmonary distress.  HEENT: No facial asymmetry, EOMI,     Neck supple .  Chest: Clear to auscultation bilaterally.decreased air entry  CVS: S1, S2 no murmurs, no S3.Regular rate.  ABD: Soft non tender.   Ext: No edema  MS: decreased  ROM spine, shoulders, hips and knees.  Skin: Intact, no ulcerations or rash noted.  Psych: Good eye contact, normal affect. Memory intact not anxious or depressed appearing.  CNS: CN 2-12 intact, power,  normal throughout.no focal deficits noted.   Assessment & Plan  Chronic cough Diagnosed in the past with asthma, has uncontrolled symptoms  with orsenigndyspnea and poor exercise tolerance. Has no maintainace inhaler,though has been prescribed in the past,  advised to stop cough drops, start maintainace inhaler and refer to Pulmonary. Handicap sticker  completed  Mixed hyperlipidemia Hyperlipidemia:Low fat diet discussed and encouraged. LDL elevated may need to increase atorvastatin dose in [place] Updated lab needed at/ before next visit.  Lipid Panel  Lab Results  Component Value Date   CHOL CANCELED 04/14/2021   HDL CANCELED 04/14/2021   LDLCALC 129 04/14/2021   TRIG CANCELED 04/14/2021   CHOLHDL 3.6 04/14/2021       Obesity (BMI 30.0-34.9)  Patient re-educated about  the importance of commitment to a  minimum of 150 minutes of exercise per week as able.  The importance of healthy food choices with portion control discussed, as well as eating regularly and within a 12 hour window most days. The need to choose "clean , green" food 50 to 75% of the time is discussed, as well as to make water the primary drink and set a goal of 64 ounces water daily.       09/09/2021    4:13 PM 08/05/2021    2:48 PM 05/17/2021    3:34 PM  Weight /BMI  Weight 188 lb 1.9 oz 193 lb 1.9 oz 197 lb  Height 5' 2.5" (1.588 m) 5' 1.5" (1.562 m) '5\' 1"'$  (1.549 m)  BMI 33.86 kg/m2 35.9 kg/m2 37.22 kg/m2      Unsteady gait when walking Needs Handicap sticker which is completed at visit  Primary hypertension Controlled, no change in medication DASH  diet and commitment to daily physical activity for a minimum of 30 minutes discussed and encouraged, as a part of hypertension management. The importance of attaining a healthy weight is also discussed.     09/09/2021    4:13 PM 08/05/2021    2:48 PM 05/17/2021    3:34 PM 05/13/2021    1:26 PM 05/13/2021    1:08 PM 04/15/2021    2:30 PM 04/14/2021    1:16 PM  BP/Weight  Systolic BP 694 854 627 035 009 381 829  Diastolic BP 65 77 75 80 87 70 84  Wt. (Lbs) 188.12 193.12 197  195.04 194 188.12  BMI  33.86 kg/m2 35.9 kg/m2 37.22 kg/m2  36.85 kg/m2 35.48 kg/m2 34.41 kg/m2       Type 2 diabetes mellitus with vascular disease ([place]) Uncontrolled , needs Endo to treat and request for asap appt sent 9 units regular insulin in office Tiffany Jacobs is reminded of the importance of commitment to daily physical activity for 30 minutes or more, as able and the need to limit carbohydrate intake to 30 to 60 grams per meal to help with blood sugar control.   The need to take medication as prescribed, test blood sugar as directed, and to call between visits if there is a concern that blood sugar is uncontrolled is also discussed.   Tiffany Jacobs is reminded of the importance of daily foot exam, annual eye examination, and good blood sugar, blood pressure and cholesterol control.     Latest Ref Rng & Units 08/05/2021    3:50 PM 04/15/2021    2:41 PM 04/14/2021    2:05 PM 04/14/2021   12:00 AM 12/22/2020    4:02 PM  Diabetic Labs  HbA1c 0.0 - 7.0 % 8.9  12    14.6   Chol mg/dL   CANCELED  200    HDL    CANCELED  56    Calc LDL mg/dL    129    Triglycerides    CANCELED  85    Creatinine    CANCELED  0.78  0.80       09/09/2021    4:13 PM 08/05/2021    2:48 PM 05/17/2021    3:34 PM 05/13/2021    1:26 PM 05/13/2021    1:08 PM 04/15/2021    2:30 PM 04/14/2021    1:16 PM  BP/Weight  Systolic BP 937 169 678 938 101 751 025  Diastolic BP 65 77 75 80 87 70 84  Wt. (Lbs) 188.12 193.12 197  195.04 194 188.12  BMI 33.86 kg/m2 35.9 kg/m2 37.22 kg/m2  36.85 kg/m2 35.48 kg/m2 34.41 kg/m2      12/04/2018    2:20 PM 02/14/2018    2:20 PM  Foot/eye exam completion dates  Foot Form Completion Done Done

## 2021-09-20 NOTE — Assessment & Plan Note (Signed)
Needs Handicap sticker which is completed at visit

## 2021-09-20 NOTE — Assessment & Plan Note (Signed)
Uncontrolled , needs Endo to treat and request for asap appt sent 9 units regular insulin in office Ms. Sylvain is reminded of the importance of commitment to daily physical activity for 30 minutes or more, as able and the need to limit carbohydrate intake to 30 to 60 grams per meal to help with blood sugar control.   The need to take medication as prescribed, test blood sugar as directed, and to call between visits if there is a concern that blood sugar is uncontrolled is also discussed.   Ms. Daw is reminded of the importance of daily foot exam, annual eye examination, and good blood sugar, blood pressure and cholesterol control.     Latest Ref Rng & Units 08/05/2021    3:50 PM 04/15/2021    2:41 PM 04/14/2021    2:05 PM 04/14/2021   12:00 AM 12/22/2020    4:02 PM  Diabetic Labs  HbA1c 0.0 - 7.0 % 8.9  12    14.6   Chol mg/dL   CANCELED  200    HDL    CANCELED  56    Calc LDL mg/dL    129    Triglycerides    CANCELED  85    Creatinine    CANCELED  0.78  0.80       09/09/2021    4:13 PM 08/05/2021    2:48 PM 05/17/2021    3:34 PM 05/13/2021    1:26 PM 05/13/2021    1:08 PM 04/15/2021    2:30 PM 04/14/2021    1:16 PM  BP/Weight  Systolic BP 500 938 182 993 716 967 893  Diastolic BP 65 77 75 80 87 70 84  Wt. (Lbs) 188.12 193.12 197  195.04 194 188.12  BMI 33.86 kg/m2 35.9 kg/m2 37.22 kg/m2  36.85 kg/m2 35.48 kg/m2 34.41 kg/m2      12/04/2018    2:20 PM 02/14/2018    2:20 PM  Foot/eye exam completion dates  Foot Form Completion Done Done

## 2021-09-20 NOTE — Assessment & Plan Note (Signed)
Diagnosed in the past with asthma, has uncontrolled symptoms with orsenigndyspnea and poor exercise tolerance. Has no maintainace inhaler,though has been prescribed in the past,  advised to stop cough drops, start maintainace inhaler and refer to Pulmonary. Handicap sticker  completed

## 2021-09-20 NOTE — Assessment & Plan Note (Signed)
  Patient re-educated about  the importance of commitment to a  minimum of 150 minutes of exercise per week as able.  The importance of healthy food choices with portion control discussed, as well as eating regularly and within a 12 hour window most days. The need to choose "clean , green" food 50 to 75% of the time is discussed, as well as to make water the primary drink and set a goal of 64 ounces water daily.       09/09/2021    4:13 PM 08/05/2021    2:48 PM 05/17/2021    3:34 PM  Weight /BMI  Weight 188 lb 1.9 oz 193 lb 1.9 oz 197 lb  Height 5' 2.5" (1.588 m) 5' 1.5" (1.562 m) '5\' 1"'$  (1.549 m)  BMI 33.86 kg/m2 35.9 kg/m2 37.22 kg/m2

## 2021-09-20 NOTE — Assessment & Plan Note (Addendum)
Hyperlipidemia:Low fat diet discussed and encouraged. LDL elevated may need to increase atorvastatin dose in [place] Updated lab needed at/ before next visit.  Lipid Panel  Lab Results  Component Value Date   CHOL CANCELED 04/14/2021   HDL CANCELED 04/14/2021   LDLCALC 129 04/14/2021   TRIG CANCELED 04/14/2021   CHOLHDL 3.6 04/14/2021

## 2021-09-23 ENCOUNTER — Telehealth: Payer: Self-pay | Admitting: Family Medicine

## 2021-09-23 NOTE — Telephone Encounter (Signed)
Patient aware paperwork is ready for pick up

## 2021-12-09 ENCOUNTER — Other Ambulatory Visit: Payer: Self-pay | Admitting: Family Medicine

## 2021-12-17 ENCOUNTER — Encounter: Payer: Self-pay | Admitting: Family Medicine

## 2021-12-17 ENCOUNTER — Ambulatory Visit (INDEPENDENT_AMBULATORY_CARE_PROVIDER_SITE_OTHER): Payer: Medicare Other | Admitting: Family Medicine

## 2021-12-17 VITALS — BP 132/82 | HR 97 | Ht 61.0 in | Wt 183.0 lb

## 2021-12-17 DIAGNOSIS — E782 Mixed hyperlipidemia: Secondary | ICD-10-CM | POA: Diagnosis not present

## 2021-12-17 DIAGNOSIS — E1159 Type 2 diabetes mellitus with other circulatory complications: Secondary | ICD-10-CM | POA: Diagnosis not present

## 2021-12-17 DIAGNOSIS — I10 Essential (primary) hypertension: Secondary | ICD-10-CM | POA: Diagnosis not present

## 2021-12-17 DIAGNOSIS — J45991 Cough variant asthma: Secondary | ICD-10-CM | POA: Diagnosis not present

## 2021-12-17 DIAGNOSIS — K219 Gastro-esophageal reflux disease without esophagitis: Secondary | ICD-10-CM

## 2021-12-17 DIAGNOSIS — R350 Frequency of micturition: Secondary | ICD-10-CM

## 2021-12-17 LAB — POCT URINALYSIS DIP (CLINITEK)
Bilirubin, UA: NEGATIVE
Blood, UA: NEGATIVE
Glucose, UA: 500 mg/dL — AB
Ketones, POC UA: NEGATIVE mg/dL
Leukocytes, UA: NEGATIVE
Nitrite, UA: NEGATIVE
POC PROTEIN,UA: NEGATIVE
Spec Grav, UA: 1.015 (ref 1.010–1.025)
Urobilinogen, UA: 0.2 E.U./dL
pH, UA: 8.5 — AB (ref 5.0–8.0)

## 2021-12-17 MED ORDER — GLIPIZIDE ER 10 MG PO TB24: ORAL_TABLET | ORAL | 2 refills | Status: DC

## 2021-12-17 NOTE — Patient Instructions (Addendum)
Keep appt as before ca;; if you need me sooner  You do not have an infection,your blood sugar is too high  Increase glipizide to TWO every morning  Cannot pill pack now as you just got 90 day suplly of medication  Lipid, cmp and EGFr and HBA1C today  Thanks for choosing [facility], we consider it a privelige to serve you.

## 2021-12-19 LAB — MICROALBUMIN / CREATININE URINE RATIO
Creatinine, Urine: 47 mg/dL
Microalb/Creat Ratio: 37 mg/g creat — ABNORMAL HIGH (ref 0–29)
Microalbumin, Urine: 17.4 ug/mL

## 2021-12-19 MED ORDER — PANTOPRAZOLE SODIUM 20 MG PO TBEC: 20.0000 mg | DELAYED_RELEASE_TABLET | Freq: Every day | ORAL | 1 refills | Status: DC

## 2021-12-19 NOTE — Assessment & Plan Note (Signed)
Controlled, no change in medication  

## 2021-12-19 NOTE — Progress Notes (Signed)
Tiffany Jacobs     MRN: [id number]      DOB: Aug 03, 1945   HPI Tiffany Jacobs is here for follow up and re-evaluation of chronic medical conditions, medication management and review of any available recent lab and radiology data.  Preventive health is updated, specifically  Cancer screening and Immunization.   Refusing to see Endo, daughter states cost, pt states " they do not do anything as she can's afford medication"In past 1 week, she has been out of glipizide. C/o excess thirst , frequent urination and thirst and weakness The PT denies any adverse reactions to current medications since the last visit.  There are no new concerns.  There are no specific complaints   ROS Denies recent fever or chills. Denies sinus pressure, nasal congestion, ear pain or sore throat. Denies chest congestion, productive cough or wheezing. Denies chest pains, palpitations and leg swelling Denies abdominal pain, nausea, vomiting,diarrhea or constipation.    Denies uncontrolled  joint pain, swelling and limitation in mobility. Denies headaches, seizures, numbness, or tingling. Denies depression, anxiety or insomnia. Denies skin break down or rash.   PE  BP 132/82 (BP Location: Left Arm)   Pulse 97   Ht '5\' 1"'$  (1.549 m)   Wt 183 lb (83 kg)   SpO2 92%   BMI 34.58 kg/m   Patient alert and oriented and in no cardiopulmonary distress.  HEENT: No facial asymmetry, EOMI,     Neck supple .  Chest: Clear to auscultation bilaterally.  CVS: S1, S2 no murmurs, no S3.Regular rate.  ABD: Soft non tender.   Ext: No edema  MS: Adequate though reduced  ROM spine, shoulders, hips and knees.  Skin: Intact, no ulcerations or rash noted.  Psych: Good eye contact, normal affect. Memory intact not anxious or depressed appearing.  CNS: CN 2-12 intact, power,  normal throughout.no focal deficits noted.   Assessment & Plan  Primary hypertension Controlled, no change in medication DASH diet and  commitment to daily physical activity for a minimum of 30 minutes discussed and encouraged, as a part of hypertension management. The importance of attaining a healthy weight is also discussed.     12/17/2021    2:54 PM 12/17/2021    2:51 PM 09/09/2021    4:13 PM 08/05/2021    2:48 PM 05/17/2021    3:34 PM 05/13/2021    1:26 PM 05/13/2021    1:08 PM  BP/Weight  Systolic BP 324 401 027 253 664 403 474  Diastolic BP 82 80 65 77 75 80 87  Wt. (Lbs)  183 188.12 193.12 197  195.04  BMI  34.58 kg/m2 33.86 kg/m2 35.9 kg/m2 37.22 kg/m2  36.85 kg/m2       Type 2 diabetes mellitus with vascular disease (HCC) Uncontrolled, compliance with medication an ongoing challenge and unwilling to follow recommendation to be treate by Endo Tiffany Jacobs is reminded of the importance of commitment to daily physical activity for 30 minutes or more, as able and the need to limit carbohydrate intake to 30 to 60 grams per meal to help with blood sugar control.   The need to take medication as prescribed, test blood sugar as directed, and to call between visits if there is a concern that blood sugar is uncontrolled is also discussed.   Tiffany Jacobs is reminded of the importance of daily foot exam, annual eye examination, and good blood sugar, blood pressure and cholesterol control.     Latest Ref Rng & Units 12/17/2021  4:52 PM 12/17/2021    4:01 PM 08/05/2021    3:50 PM 04/15/2021    2:41 PM 04/14/2021    2:05 PM  Diabetic Labs  HbA1c   WILL FOLLOW  P 8.9  12    Micro/Creat Ratio 0 - 29 mg/g creat 37       Chol 100 - 199 mg/dL  174    CANCELED   HDL >39 mg/dL  50    CANCELED   Calc LDL 0 - 99 mg/dL  99      Triglycerides 0 - 149 mg/dL  140    CANCELED   Creatinine 0.57 - 1.00 mg/dL  0.95    CANCELED     P Preliminary result      12/17/2021    2:54 PM 12/17/2021    2:51 PM 09/09/2021    4:13 PM 08/05/2021    2:48 PM 05/17/2021    3:34 PM 05/13/2021    1:26 PM 05/13/2021    1:08 PM  BP/Weight  Systolic BP  188 416 606 301 601 093 235  Diastolic BP 82 80 65 77 75 80 87  Wt. (Lbs)  183 188.12 193.12 197  195.04  BMI  34.58 kg/m2 33.86 kg/m2 35.9 kg/m2 37.22 kg/m2  36.85 kg/m2      12/04/2018    2:20 PM 02/14/2018    2:20 PM  Foot/eye exam completion dates  Foot Form Completion Done Done        Urinary frequency CCUA shows no infection, symptom is from uncontrolled diabetes  Mixed hyperlipidemia Hyperlipidemia:Low fat diet discussed and encouraged.   Lipid Panel  Lab Results  Component Value Date   CHOL 174 12/17/2021   HDL 50 12/17/2021   LDLCALC 99 12/17/2021   TRIG 140 12/17/2021   CHOLHDL 3.5 12/17/2021     Controlled, no change in medication   COUGH VARIANT ASTHMA Controlled, no change in medication   Gastroesophageal reflux disease Controlled, no change in medication

## 2021-12-19 NOTE — Assessment & Plan Note (Signed)
Hyperlipidemia:Low fat diet discussed and encouraged.   Lipid Panel  Lab Results  Component Value Date   CHOL 174 12/17/2021   HDL 50 12/17/2021   LDLCALC 99 12/17/2021   TRIG 140 12/17/2021   CHOLHDL 3.5 12/17/2021     Controlled, no change in medication

## 2021-12-19 NOTE — Assessment & Plan Note (Signed)
Controlled, no change in medication DASH diet and commitment to daily physical activity for a minimum of 30 minutes discussed and encouraged, as a part of hypertension management. The importance of attaining a healthy weight is also discussed.     12/17/2021    2:54 PM 12/17/2021    2:51 PM 09/09/2021    4:13 PM 08/05/2021    2:48 PM 05/17/2021    3:34 PM 05/13/2021    1:26 PM 05/13/2021    1:08 PM  BP/Weight  Systolic BP 202 542 706 237 628 315 176  Diastolic BP 82 80 65 77 75 80 87  Wt. (Lbs)  183 188.12 193.12 197  195.04  BMI  34.58 kg/m2 33.86 kg/m2 35.9 kg/m2 37.22 kg/m2  36.85 kg/m2

## 2021-12-19 NOTE — Assessment & Plan Note (Signed)
Uncontrolled, compliance with medication an ongoing challenge and unwilling to follow recommendation to be treate by Endo Ms. Bartell is reminded of the importance of commitment to daily physical activity for 30 minutes or more, as able and the need to limit carbohydrate intake to 30 to 60 grams per meal to help with blood sugar control.   The need to take medication as prescribed, test blood sugar as directed, and to call between visits if there is a concern that blood sugar is uncontrolled is also discussed.   Ms. Pinkett is reminded of the importance of daily foot exam, annual eye examination, and good blood sugar, blood pressure and cholesterol control.     Latest Ref Rng & Units 12/17/2021    4:52 PM 12/17/2021    4:01 PM 08/05/2021    3:50 PM 04/15/2021    2:41 PM 04/14/2021    2:05 PM  Diabetic Labs  HbA1c   WILL FOLLOW  P 8.9  12    Micro/Creat Ratio 0 - 29 mg/g creat 37       Chol 100 - 199 mg/dL  174    CANCELED   HDL >39 mg/dL  50    CANCELED   Calc LDL 0 - 99 mg/dL  99      Triglycerides 0 - 149 mg/dL  140    CANCELED   Creatinine 0.57 - 1.00 mg/dL  0.95    CANCELED     P Preliminary result      12/17/2021    2:54 PM 12/17/2021    2:51 PM 09/09/2021    4:13 PM 08/05/2021    2:48 PM 05/17/2021    3:34 PM 05/13/2021    1:26 PM 05/13/2021    1:08 PM  BP/Weight  Systolic BP 143 888 757 972 820 601 561  Diastolic BP 82 80 65 77 75 80 87  Wt. (Lbs)  183 188.12 193.12 197  195.04  BMI  34.58 kg/m2 33.86 kg/m2 35.9 kg/m2 37.22 kg/m2  36.85 kg/m2      12/04/2018    2:20 PM 02/14/2018    2:20 PM  Foot/eye exam completion dates  Foot Form Completion Done Done

## 2021-12-19 NOTE — Assessment & Plan Note (Signed)
CCUA shows no infection, symptom is from uncontrolled diabetes

## 2021-12-21 LAB — CMP14+EGFR
ALT: 19 IU/L (ref 0–32)
AST: 24 IU/L (ref 0–40)
Albumin/Globulin Ratio: 1.5 (ref 1.2–2.2)
Albumin: 4.3 g/dL (ref 3.8–4.8)
Alkaline Phosphatase: 135 IU/L — ABNORMAL HIGH (ref 44–121)
BUN/Creatinine Ratio: 9 — ABNORMAL LOW (ref 12–28)
BUN: 9 mg/dL (ref 8–27)
Bilirubin Total: 0.4 mg/dL (ref 0.0–1.2)
CO2: 28 mmol/L (ref 20–29)
Calcium: 10.1 mg/dL (ref 8.7–10.3)
Chloride: 94 mmol/L — ABNORMAL LOW (ref 96–106)
Creatinine, Ser: 0.95 mg/dL (ref 0.57–1.00)
Globulin, Total: 2.9 g/dL (ref 1.5–4.5)
Glucose: 429 mg/dL — ABNORMAL HIGH (ref 70–99)
Potassium: 5.5 mmol/L — ABNORMAL HIGH (ref 3.5–5.2)
Sodium: 136 mmol/L (ref 134–144)
Total Protein: 7.2 g/dL (ref 6.0–8.5)
eGFR: 62 mL/min/{1.73_m2} (ref >59)

## 2021-12-21 LAB — HEMOGLOBIN A1C
Est. average glucose Bld gHb Est-mCnc: 358 mg/dL
Hgb A1c MFr Bld: 14.1 % — ABNORMAL HIGH (ref 4.8–5.6)

## 2021-12-21 LAB — LIPID PANEL
Chol/HDL Ratio: 3.5 ratio (ref 0.0–4.4)
Cholesterol, Total: 174 mg/dL (ref 100–199)
HDL: 50 mg/dL (ref >39)
LDL Chol Calc (NIH): 99 mg/dL (ref 0–99)
Triglycerides: 140 mg/dL (ref 0–149)
VLDL Cholesterol Cal: 25 mg/dL (ref 5–40)

## 2021-12-30 ENCOUNTER — Encounter: Payer: Medicare Other | Admitting: Family Medicine

## 2021-12-30 DIAGNOSIS — H401112 Primary open-angle glaucoma, right eye, moderate stage: Secondary | ICD-10-CM | POA: Diagnosis not present

## 2022-01-04 ENCOUNTER — Other Ambulatory Visit: Payer: Self-pay

## 2022-01-04 ENCOUNTER — Telehealth: Payer: Self-pay

## 2022-01-04 DIAGNOSIS — M5010 Cervical disc disorder with radiculopathy, unspecified cervical region: Secondary | ICD-10-CM

## 2022-01-04 MED ORDER — GABAPENTIN 300 MG PO CAPS: 300.0000 mg | ORAL_CAPSULE | Freq: Every day | ORAL | 1 refills | Status: DC

## 2022-01-04 NOTE — Telephone Encounter (Signed)
Patient called need med refill  gabapentin (NEURONTIN) 300 MG capsule    Pharmacy:  [facility]

## 2022-01-04 NOTE — Telephone Encounter (Signed)
Refills sent

## 2022-01-05 ENCOUNTER — Encounter: Payer: Self-pay | Admitting: Family Medicine

## 2022-01-05 ENCOUNTER — Ambulatory Visit (INDEPENDENT_AMBULATORY_CARE_PROVIDER_SITE_OTHER): Payer: Medicare Other | Admitting: Family Medicine

## 2022-01-05 VITALS — BP 130/80 | HR 82 | Ht 61.0 in | Wt 185.0 lb

## 2022-01-05 DIAGNOSIS — Z0001 Encounter for general adult medical examination with abnormal findings: Secondary | ICD-10-CM | POA: Diagnosis not present

## 2022-01-05 DIAGNOSIS — Z23 Encounter for immunization: Secondary | ICD-10-CM

## 2022-01-05 DIAGNOSIS — E1159 Type 2 diabetes mellitus with other circulatory complications: Secondary | ICD-10-CM

## 2022-01-05 MED ORDER — "BD DISP NEEDLES 27G X 1/2"" MISC": 3 refills | Status: DC

## 2022-01-05 MED ORDER — "BD SAFETYGLIDE INSULIN SYRINGE 31G X 15/64"" 0.3 ML MISC": 3 refills | Status: AC

## 2022-01-05 MED ORDER — NOVOLIN 70/30 RELION (70-30) 100 UNIT/ML ~~LOC~~ SUSP: 10.0000 [IU] | Freq: Two times a day (BID) | SUBCUTANEOUS | 11 refills | Status: DC

## 2022-01-05 NOTE — Progress Notes (Signed)
    Tiffany Jacobs     MRN: [id number]      DOB: 1945-11-13  HPI: Patient is in for annual physical exam. Most of the visit is actually spent reviewing uncontrolled diabetes and management . Pt continues to refuse to see Endocrinology, she feels that she does not need insulin as " nothing works" Also still c/o chronic cough, unable to be contacted on telephone Recent labs,  are reviewed. Immunization is reviewed , and  updated if needed.   PE: BP 130/80   Pulse 82   Ht '5\' 1"'$  (1.549 m)   Wt 185 lb (83.9 kg)   SpO2 94%   BMI 34.96 kg/m   Pleasant  female, alert and oriented x 3, in no cardio-pulmonary distress. Afebrile. HEENT No facial trauma or asymetry. Sinuses non tender.  Extra occullar muscles intact.. External ears normal, . Neck: supple, no adenopathy,JVD or thyromegaly.No bruits.  Chest: Clear to ascultation bilaterally.No crackles or wheezes. Non tender to palpation    Cardiovascular system; Heart sounds normal,  S1 and  S2 ,no S3.  No murmur, or thrill. Apical beat not displaced Peripheral pulses normal.    Musculoskeletal exam: Adequate though reduced  ROM of spine, hips , shoulders and knees. .   Neurologic: Cranial nerves 2 to 12 intact. Power, normal throughout. Mild  disturbance in gait. No tremor.  Skin: Intact, no ulceration, erythema , scaling or rash noted. Pigmentation normal throughout  Psych; Normal mood and affect. Judgement and concentration normal   Assessment & Plan:  Annual visit for general adult medical examination with abnormal findings Annual exam as documented. Counseling done  re healthy lifestyle involving commitment to 150 minutes exercise per week, heart healthy diet, and attaining healthy weight.The importance of adequate sleep also discussed. . Immunization and cancer screening needs are specifically addressed at this visit.

## 2022-01-05 NOTE — Patient Instructions (Addendum)
Follow-up in 2 in 5 weeks with blood sugar log call if you need me sooner.  New is Relion 70/30 10 units twice daily for your blood sugar continue glipizide as you are currently taking it.Sent to both [place] and [facility] for best price  Nurse pls send in rx for needles and to self administer the insulin twice daily  Please bring meter, blood sugar to next appt  Please give pt blood sugar log book  Blood pressure and cholesterol are good.  You are referred for an ultrasound of your neck arteries due to lightheadedness.  We are unable to contact you on the phone so request that you reach out to [place] for the appointment information   Please schedule pulmonary at [facility], pt inaccessible on phone and no voicemail, referred for chronic cough (pls re enter, as appt was closed unable to contact pt)  Thanks for choosing [facility], we consider it a privelige to serve you.

## 2022-01-09 ENCOUNTER — Encounter: Payer: Self-pay | Admitting: Family Medicine

## 2022-01-09 DIAGNOSIS — Z0001 Encounter for general adult medical examination with abnormal findings: Secondary | ICD-10-CM | POA: Insufficient documentation

## 2022-01-09 NOTE — Assessment & Plan Note (Signed)
Annual exam as documented. Counseling done  re healthy lifestyle involving commitment to 150 minutes exercise per week, heart healthy diet, and attaining healthy weight.The importance of adequate sleep also discussed.  Immunization and cancer screening needs are specifically addressed at this visit.  

## 2022-01-19 ENCOUNTER — Encounter: Payer: Self-pay | Admitting: Family Medicine

## 2022-02-01 ENCOUNTER — Ambulatory Visit (INDEPENDENT_AMBULATORY_CARE_PROVIDER_SITE_OTHER): Payer: Medicare Other | Admitting: Internal Medicine

## 2022-02-01 ENCOUNTER — Encounter: Payer: Self-pay | Admitting: Internal Medicine

## 2022-02-01 DIAGNOSIS — Z0000 Encounter for general adult medical examination without abnormal findings: Secondary | ICD-10-CM

## 2022-02-01 NOTE — Progress Notes (Signed)
Subjective:  This is a telephone encounter between Tiffany Jacobs and Tiffany Jacobs on 02/01/2022 for AWV. The visit was conducted with the patient located at home and Tiffany Jacobs at [facility]. The patient's identity was confirmed using their DOB and current address. The patient has consented to being evaluated through a telephone encounter and understands the associated risks (an examination cannot be done and the patient may need to come in for an appointment) / benefits (allows the patient to remain at home, decreasing exposure to coronavirus).      Tiffany Jacobs is a 76 y.o. female who presents for Medicare Annual (Subsequent) preventive examination.  Review of Systems    Review of Systems  All other systems reviewed and are negative.    Objective:    There were no vitals filed for this visit. There is no height or weight on file to calculate BMI.     02/01/2022    1:06 PM 01/07/2021    2:19 PM 01/06/2020    3:41 PM 12/20/2017    3:47 PM 10/12/2016    7:22 PM 10/10/2016    9:41 AM 05/19/2015    7:27 PM  Advanced Directives  Does Patient Have a Medical Advance Directive? _0  No No  Would patient like information on creating a medical advance directive? Yes (MAU/Ambulatory/Procedural Areas - Information given) No - Patient declined No - Patient declined Yes (ED - Information included in AVS) No - Patient declined No - Patient declined No - patient declined information    Current Medications (verified) Outpatient Encounter Medications as of 02/01/2022  Medication Sig   albuterol (PROVENTIL) (2.5 MG/3ML) 0.083% nebulizer solution Take 3 mLs (2.5 mg total) by nebulization every 6 (six) hours as needed for wheezing or shortness of breath.   albuterol (VENTOLIN HFA) 108 (90 Base) MCG/ACT inhaler Inhale 2 puffs into the lungs every 6 (six) hours as needed for wheezing or shortness of breath. (Patient not taking: Reported on 01/05/2022)   Alcohol Swabs (B-D SINGLE USE  SWABS REGULAR) PADS Use to test as directed   amlodipine-atorvastatin (CADUET) 10-20 MG tablet Take 1 tablet by mouth daily.   ASPIRIN 81 PO Take 1 tablet by mouth daily. (Patient not taking: Reported on 01/05/2022)   blood glucose meter kit and supplies Two times daily testing (dispense meter based on insurance preference) dx e11.65   ferrous sulfate 325 (65 FE) MG tablet Take 325 mg by mouth daily with breakfast.   gabapentin (NEURONTIN) 300 MG capsule Take 1 capsule (300 mg total) by mouth at bedtime.   glipiZIDE (GLUCOTROL XL) 10 MG 24 hr tablet Take two tablets by mouth every morning with breakfast   glucose blood test strip Use as instructed once daily dx E11.9   hydrALAZINE (APRESOLINE) 50 MG tablet Take one and a half tablets by mouth two times daily, for blood pressure   insulin NPH-regular Human (NOVOLIN 70/30 RELION) (70-30) 100 UNIT/ML injection Inject 10 Units into the skin 2 (two) times daily with a meal.   Insulin Pen Needle 31G X 5 MM MISC To be used with Lantus insulin pen. Dispensed based on patient's insurance coverage. Please make sure these fit on the patients lantus pen (Patient not taking: Reported on 01/05/2022)   Insulin Syringe-Needle U-100 (BD SAFETYGLIDE INSULIN SYRINGE) 31G X 15/64" 0.3 ML MISC Use to self administer insulin twice daily.   latanoprost (XALATAN) 0.005 % ophthalmic solution SMARTSIG:1 Drop(s) In Eye(s) Every Evening   metoprolol tartrate (LOPRESSOR)  25 MG tablet Take 1 tablet (25 mg total) by mouth 2 (two) times daily.   nitroGLYCERIN (NITROSTAT) 0.4 MG SL tablet DISSOLVE ONE TABLET UNDER THE TONGUE EVERY 5 MINUTES AS NEEDED FOR CHEST PAIN. DO NOT EXCEED A TOTAL OF 3 DOSES IN 15 MINUTES   pantoprazole (PROTONIX) 20 MG tablet Take 1 tablet (20 mg total) by mouth daily.   spironolactone (ALDACTONE) 25 MG tablet TAKE ONE TABLET BY MOUTH ONCE DAILY.   timolol (TIMOPTIC) 0.5 % ophthalmic solution 1 drop 2 (two) times daily.   TRUEplus Lancets 33G MISC TEST BLOOD  SUGAR THREE TIMES DAILY   No facility-administered encounter medications on file as of 02/01/2022.    Allergies (verified) Ace inhibitors and Latex   History: Past Medical History:  Diagnosis Date   Arthritis    Asthma 1963   Chest pain with high risk for cardiac etiology 08/21/2017   Diabetes ([place])    Diabetes mellitus, type 2 ([place]) 2004   GERD (gastroesophageal reflux disease) 2004   Hypertension 2004   Low back pain    Obesity    RAD (reactive airway disease)    Sciatica    Seasonal allergies    Past Surgical History:  Procedure Laterality Date   ACROMIO-CLAVICULAR JOINT REPAIR Right 11/09/2012   Procedure: ACROMIO-CLAVICULAR JOINT REPAIR;  Surgeon: Carole Civil, MD;  Location: [facility];  Service: Orthopedics;  Laterality: Right;   COLONOSCOPY N/A 10/10/2016   Procedure: COLONOSCOPY;  Surgeon: Danie Binder, MD;  Location: [facility] END[facility] SUITE;  Service: Endoscopy;  Laterality: N/A;  10:30   EYE SURGERY Right 05/09/2018   cataract   EYE SURGERY Left 2021   cataract   left breast biopsy for benign disease     SHOULDER ACROMIOPLASTY Right 11/09/2012   Procedure: RIGHT SHOULDER ACROMIOPLASTY;  Surgeon: Carole Civil, MD;  Location: [facility];  Service: Orthopedics;  Laterality: Right;   SHOULDER OPEN ROTATOR CUFF REPAIR Right 11/09/2012   Procedure: ROTATOR CUFF REPAIR Right SHOULDER OPEN;  Surgeon: Carole Civil, MD;  Location: [facility];  Service: Orthopedics;  Laterality: Right;   total knee arthroplasty left  02/01/2005   Dr. Aline Brochure   TUBAL LIGATION  1970   Family History  Problem Relation Age of Onset   Kidney failure Father    Kidney disease Father    Stroke Brother 10       used drugs , stroke and heart disease   Diabetes Brother    Hypertension Brother    Cancer Daughter 81       breast    Diabetes Mother    Asthma Other    Lung disease Other    Cancer Other    Heart disease Other    Arthritis Other    Colon cancer Neg Hx    Colon polyps Neg Hx     Social History   Socioeconomic History   Marital status: Married    Spouse name: Jeneen Rinks    Number of children: 7   Years of education: 8   Highest education level: GED or equivalent  Occupational History   Occupation: disabled   Tobacco Use   Smoking status: Former    Packs/day: 0.50    Years: 37.00    Total pack years: 18.50    Types: Cigarettes    Quit date: 10/12/1988    Years since quitting: 33.3   Smokeless tobacco: Never  Vaping Use   Vaping Use: Never used  Substance and Sexual Activity   Alcohol  use: No   Drug use: No   Sexual activity: Not Currently    Birth control/protection: Post-menopausal  Other Topics Concern   Not on file  Social History Narrative   Not on file   Social Determinants of Health   Financial Resource Strain: Low Risk  (01/07/2021)   Overall Financial Resource Strain (CARDIA)    Difficulty of Paying Living Expenses: Not hard at all  Food Insecurity: No Food Insecurity (01/07/2021)   Hunger Vital Sign    Worried About Running Out of Food in the Last Year: Never true    Ran Out of Food in the Last Year: Never true  Transportation Needs: No Transportation Needs (01/07/2021)   PRAPARE - Hydrologist (Medical): No    Lack of Transportation (Non-Medical): No  Physical Activity: Sufficiently Active (01/07/2021)   Exercise Vital Sign    Days of Exercise per Week: 7 days    Minutes of Exercise per Session: 60 min  Stress: No Stress Concern Present (01/06/2020)   [place]    Feeling of Stress : Not at all  Social Connections: Socially Isolated (01/07/2021)   Social Connection and Isolation Panel [NHANES]    Frequency of Communication with Friends and Family: More than three times a week    Frequency of Social Gatherings with Friends and Family: More than three times a week    Attends Religious Services: Never    Marine scientist or Organizations:  No    Attends Archivist Meetings: Never    Marital Status: Widowed    Tobacco Counseling Counseling given: Not Answered   Clinical Intake:  Pre-visit preparation completed: Yes  Pain : No/denies pain     Diabetes: Yes CBG done?: No Did pt. bring in CBG monitor from home?: No  How often do you need to have someone help you when you read instructions, pamphlets, or other written materials from your doctor or pharmacy?: 2 - Rarely What is the last grade level you completed in school?: High School diploma    Activities of Daily Living    02/01/2022    1:27 PM  In your present state of health, do you have any difficulty performing the following activities:  Hearing? 0  Vision? 0  Difficulty concentrating or making decisions? 0  Walking or climbing stairs? 0  Dressing or bathing? 0  Doing errands, shopping? 0    Patient Care Team: Fayrene Helper, MD as PCP - General Herminio Commons, MD (Inactive) as PCP - Cardiology (Cardiology) Carole Civil, MD as Consulting Physician (Orthopedic Surgery) Leta Baptist, MD as Consulting Physician (Otolaryngology) Eloise Harman, DO as Consulting Physician (Internal Medicine) Beryle Lathe, [facility] (Inactive) (Pharmacist)  Indicate any recent Medical Services you may have received from other than [facility] providers in the past year (date may be approximate).     Assessment:   This is a routine wellness examination for [place].  Hearing/Vision screen No results found.  Dietary issues and exercise activities discussed:     Goals Addressed   None    Depression Screen    01/05/2022    3:27 PM 12/17/2021    2:52 PM 09/09/2021    4:18 PM 05/13/2021    1:09 PM 04/14/2021    1:17 PM 01/07/2021    2:20 PM 01/07/2021    2:17 PM  PHQ 2/9 Scores  PHQ - 2 Score 0 0 0 0 0  0 0  PHQ- 9 Score      0 0    Fall Risk    01/05/2022    3:27 PM 12/17/2021    2:51 PM 09/09/2021    4:18 PM 08/05/2021    2:52 PM  05/13/2021    1:08 PM  [place] in the past year? 0 0 0 0 0  Number falls in past yr: 0 0 0 0 0  Injury with Fall? 0 0 0  0  Risk for fall due to : No Fall Risks No Fall Risks No Fall Risks  No Fall Risks  Follow up Falls evaluation completed  Falls evaluation completed  Falls evaluation completed    [place]:  Any stairs in or around the home? Yes  If so, are there any without handrails? Yes  Home free of loose throw rugs in walkways, pet beds, electrical cords, etc? Yes  Adequate lighting in your home to reduce risk of falls? Yes   ASSISTIVE DEVICES UTILIZED TO PREVENT FALLS:  Life alert? Yes  Use of a cane, walker or w/c? Yes  Grab bars in the bathroom? No  Shower chair or bench in shower? Yes  Elevated toilet seat or a handicapped toilet? Yes     Cognitive Function:    01/07/2021    2:21 PM  MMSE - Mini Mental State Exam  Not completed: Unable to complete        02/01/2022    1:27 PM 01/07/2021    2:21 PM 01/06/2020    3:47 PM 12/25/2018    2:21 PM 12/20/2017    3:52 PM  6CIT Screen  What Year? 0 points 0 points 0 points 0 points 0 points  What month? 0 points 0 points 0 points 0 points 0 points  What time? 0 points 0 points 0 points 0 points 0 points  Count back from 20 0 points 0 points 0 points 0 points 0 points  Months in reverse 0 points 0 points 0 points 0 points 2 points  Repeat phrase 0 points 0 points 0 points 0 points 0 points  Total Score 0 points 0 points 0 points 0 points 2 points    Immunizations Immunization History  Administered Date(s) Administered   Fluad Quad(high Dose 65+) 12/04/2018, 12/18/2019, 12/22/2020, 01/05/2022   Influenza Whole 03/29/2006, 11/27/2009   Influenza,inj,Quad PF,6+ Mos 03/20/2013, 01/24/2014, 12/25/2014, 01/22/2016, 10/31/2016, 01/31/2017, 10/19/2017   Pneumococcal Conjugate-13 06/06/2014   Pneumococcal Polysaccharide-23 08/27/2004, 11/27/2009, 08/27/2015   Td 08/27/2004     TDAP status: Up to date  Flu Vaccine status: Up to date  Pneumococcal vaccine status: Up to date  Covid-19 vaccine status: Information provided on how to obtain vaccines.   Qualifies for Shingles Vaccine? Yes   Zostavax completed No   Shingrix Completed?: No.    Education has been provided regarding the importance of this vaccine. Patient has been advised to call insurance company to determine out of pocket expense if they have not yet received this vaccine. Advised may also receive vaccine at local pharmacy or Health Dept. Verbalized acceptance and understanding.  Screening Tests Health Maintenance  Topic Date Due   COVID-19 Vaccine (1) Never done   Zoster Vaccines- Shingrix (1 of 2) Never done   Medicare Annual Wellness (AWV)  01/07/2022   OPHTHALMOLOGY EXAM  05/20/2022   HEMOGLOBIN A1C  06/18/2022   Diabetic kidney evaluation - GFR measurement  12/18/2022   Diabetic kidney evaluation -  Urine ACR  12/18/2022   FOOT EXAM  01/06/2023   Pneumonia Vaccine 55+ Years old  Completed   INFLUENZA VACCINE  Completed   DEXA SCAN  Completed   Hepatitis C Screening  Completed   HPV VACCINES  Aged Out   COLONOSCOPY (Pts 45-69yr Insurance coverage will need to be confirmed)  Discontinued    Health Maintenance  Health Maintenance Due  Topic Date Due   COVID-19 Vaccine (1) Never done   Zoster Vaccines- Shingrix (1 of 2) Never done   Medicare Annual Wellness (AWV)  01/07/2022    Colorectal cancer screening: Type of screening: Colonoscopy. Completed 10/10/2016. Repeat every 3 years  Mammogram status: No longer required due to age.  Bone Density status: Completed 01/24/2013. Results reflect: Bone density results: NORMAL.   Lung Cancer Screening: (Low Dose CT Chest recommended if Age 164-80years, 30 pack-year currently smoking OR have quit w/in 15years.) does not qualify.   Additional Screening:  Hepatitis C Screening: does not qualify; Completed 05/26/2015  Vision Screening:  Recommended annual ophthalmology exams for early detection of glaucoma and other disorders of the eye. Is the patient up to date with their annual eye exam?  Yes  Who is the provider or what is the name of the office in which the patient attends annual eye exams? Dr.Shaw If pt is not established with a provider, would they like to be referred to a provider to establish care? No .   Dental Screening: Recommended annual dental exams for proper oral hygiene  Community Resource Referral / Chronic Care Management: CRR required this visit?  No   CCM required this visit?  No      Plan:     I have personally reviewed and noted the following in the patient's chart:   Medical and social history Use of alcohol, tobacco or illicit drugs  Current medications and supplements including opioid prescriptions. Patient is not currently taking opioid prescriptions. Functional ability and status Nutritional status Physical activity Advanced directives List of other physicians Hospitalizations, surgeries, and ER visits in previous 12 months Vitals Screenings to include cognitive, depression, and falls Referrals and appointments  In addition, I have reviewed and discussed with patient certain preventive protocols, quality metrics, and best practice recommendations. A written personalized care plan for preventive services as well as general preventive health recommendations were provided to patient.     JLorene Dy MD   02/01/2022

## 2022-02-01 NOTE — Patient Instructions (Signed)
  Tiffany Jacobs , Thank you for taking time to come for your Medicare Wellness Visit. I appreciate your ongoing commitment to your health goals. Please review the following plan we discussed and let me know if I can assist you in the future.   These are the goals we discussed:   Referral for colonoscopy.     This is a list of the screening recommended for you and due dates:  Health Maintenance  Topic Date Due   COVID-19 Vaccine (1) Never done   Zoster (Shingles) Vaccine (1 of 2) Never done   Medicare Annual Wellness Visit  01/07/2022   Eye exam for diabetics  05/20/2022   Hemoglobin A1C  06/18/2022   Yearly kidney function blood test for diabetes  12/18/2022   Yearly kidney health urinalysis for diabetes  12/18/2022   Complete foot exam   01/06/2023   Pneumonia Vaccine  Completed   Flu Shot  Completed   DEXA scan (bone density measurement)  Completed   Hepatitis C Screening: USPSTF Recommendation to screen - Ages 35-79 yo.  Completed   HPV Vaccine  Aged Out   Colon Cancer Screening  Discontinued

## 2022-02-02 ENCOUNTER — Encounter: Payer: Self-pay | Admitting: *Deleted

## 2022-02-10 ENCOUNTER — Institutional Professional Consult (permissible substitution): Payer: Medicare Other | Admitting: Internal Medicine

## 2022-02-22 ENCOUNTER — Ambulatory Visit (INDEPENDENT_AMBULATORY_CARE_PROVIDER_SITE_OTHER): Payer: Medicare Other | Admitting: Family Medicine

## 2022-02-22 ENCOUNTER — Other Ambulatory Visit: Payer: Self-pay

## 2022-02-22 ENCOUNTER — Encounter: Payer: Self-pay | Admitting: Family Medicine

## 2022-02-22 VITALS — BP 120/82 | HR 80 | Ht 61.0 in | Wt 184.1 lb

## 2022-02-22 DIAGNOSIS — E1159 Type 2 diabetes mellitus with other circulatory complications: Secondary | ICD-10-CM

## 2022-02-22 DIAGNOSIS — E782 Mixed hyperlipidemia: Secondary | ICD-10-CM | POA: Diagnosis not present

## 2022-02-22 DIAGNOSIS — I10 Essential (primary) hypertension: Secondary | ICD-10-CM | POA: Diagnosis not present

## 2022-02-22 DIAGNOSIS — J45991 Cough variant asthma: Secondary | ICD-10-CM

## 2022-02-22 DIAGNOSIS — E669 Obesity, unspecified: Secondary | ICD-10-CM | POA: Diagnosis not present

## 2022-02-22 DIAGNOSIS — E1165 Type 2 diabetes mellitus with hyperglycemia: Secondary | ICD-10-CM | POA: Diagnosis not present

## 2022-02-22 MED ORDER — TRELEGY ELLIPTA 100-62.5-25 MCG/ACT IN AEPB: 1.0000 | INHALATION_SPRAY | Freq: Every day | RESPIRATORY_TRACT | 0 refills | Status: DC

## 2022-02-22 MED ORDER — HYDRALAZINE HCL 50 MG PO TABS: ORAL_TABLET | ORAL | 5 refills | Status: DC

## 2022-02-22 MED ORDER — NOVOLIN 70/30 RELION (70-30) 100 UNIT/ML ~~LOC~~ SUSP: 10.0000 [IU] | Freq: Two times a day (BID) | SUBCUTANEOUS | 11 refills | Status: DC

## 2022-02-22 NOTE — Assessment & Plan Note (Signed)
Hyperlipidemia:Low fat diet discussed and encouraged.   Lipid Panel  Lab Results  Component Value Date   CHOL 174 12/17/2021   HDL 50 12/17/2021   LDLCALC 99 12/17/2021   TRIG 140 12/17/2021   CHOLHDL 3.5 12/17/2021

## 2022-02-22 NOTE — Patient Instructions (Signed)
F/u in early April, call if you need me sooner  Insulin sent to [facility] is $25 per vial, continue glipizide and keep appt with Nutrition and Dr Dorris Fetch please  Trelegy sample given take one puff once daily and keep appt with lung specialist to get asthma/ cough treated   NO wax or fluid in your ear. No need to flush  Excellent blood pressure and cholesterol  Thanks for choosing [facility], we consider it a privelige to serve you.  Best for 2024!

## 2022-02-22 NOTE — Progress Notes (Signed)
Tiffany Jacobs     MRN: [id number]      DOB: 05-30-1945   HPI Tiffany Jacobs is here for follow up and re-evaluation of chronic medical conditions, medication management and review of any available recent lab and radiology data.  Preventive health is updated, specifically  Cancer screening and Immunization.   Questions or concerns regarding consultations or procedures which the PT has had in the interim are  addressed.Rescheduled pulmonary eval but intends to go, unable to afford inhaler prescribed after filling first script Has not been taking insulin and reports blood sugar fasting over 20, willing to go back to Endo for management  Reports insulin coasts over $100, script checked an again sent to [facility] where cost is $25  C/o fluid / something moving in her ears, went to get hearing aids and was told to get PCP to clean ears reportedly ROS Denies recent fever or chills. C/o chronic cough , sucking cough drops constantly Denies chest pains, palpitations and leg swelling Denies abdominal pain, nausea, vomiting,diarrhea or constipation.   Denies dysuria, frequency, hesitancy or incontinence. Chronic joint pain, and limitation in mobility. Denies headaches, seizures, numbness, or tingling.    PE  BP 120/82   Pulse 80   Ht '5\' 1"'$  (1.549 m)   Wt 184 lb 1.9 oz (83.5 kg)   SpO2 92%   BMI 34.79 kg/m   Patient alert and oriented and in no cardiopulmonary distress.  HEENT: No facial asymmetry, EOMI,     Neck supple .TM clear bilaterally, no cerumen impaction, small amt of cerumen, no erythema of TM and good light reflex Chest: Clear to auscultation bilaterally.  CVS: S1, S2 no murmurs, no S3.Regular rate.  ABD: Soft non tender.   Ext: No edema  MS: decreased  ROM spine, shoulders, hips and knees.  Skin: Intact, no ulcerations or rash noted.  Psych: Good eye contact, normal affect. Memory intact not anxious or depressed appearing.  CNS: CN 2-12 intact, power,  normal  throughout.no focal deficits noted.   Assessment & Plan  Primary hypertension Controlled, no change in medication DASH diet and commitment to daily physical activity for a minimum of 30 minutes discussed and encouraged, as a part of hypertension management. The importance of attaining a healthy weight is also discussed.     02/22/2022    3:19 PM 02/22/2022    2:51 PM 02/22/2022    2:48 PM 01/05/2022    3:58 PM 01/05/2022    3:28 PM 01/05/2022    3:27 PM 12/17/2021    2:54 PM  BP/Weight  Systolic BP 242 683 419 [phone] 211  Diastolic BP 82 72 83 80 78 76 82  Wt. (Lbs)   184.12   185   BMI   34.79 kg/m2   34.96 kg/m2        Obesity (BMI 30.0-34.9)  Patient re-educated about  the importance of commitment to a  minimum of 150 minutes of exercise per week as able.  The importance of healthy food choices with portion control discussed, as well as eating regularly and within a 12 hour window most days. The need to choose "clean , green" food 50 to 75% of the time is discussed, as well as to make water the primary drink and set a goal of 64 ounces water daily.       02/22/2022    2:48 PM 01/05/2022    3:27 PM 12/17/2021    2:51 PM  Weight /BMI  Weight  184 lb 1.9 oz 185 lb 183 lb  Height '5\' 1"'$  (1.549 m) '5\' 1"'$  (1.549 m) '5\' 1"'$  (1.549 m)  BMI 34.79 kg/m2 34.96 kg/m2 34.58 kg/m2      Mixed hyperlipidemia Hyperlipidemia:Low fat diet discussed and encouraged.   Lipid Panel  Lab Results  Component Value Date   CHOL 174 12/17/2021   HDL 50 12/17/2021   [place] 99 12/17/2021   TRIG 140 12/17/2021   CHOLHDL 3.5 12/17/2021       Type 2 diabetes mellitus with vascular disease ([place]) Uncontrolled, refer to Endo and for diabetic ed , pt agrees Start insulin as prescribed Tiffany Jacobs is reminded of the importance of commitment to daily physical activity for 30 minutes or more, as able and the need to limit carbohydrate intake to 30 to 60 grams per meal to help with  blood sugar control.   The need to take medication as prescribed, test blood sugar as directed, and to call between visits if there is a concern that blood sugar is uncontrolled is also discussed.   Tiffany Jacobs is reminded of the importance of daily foot exam, annual eye examination, and good blood sugar, blood pressure and cholesterol control.     Latest Ref Rng & Units 12/17/2021    4:52 PM 12/17/2021    4:01 PM 08/05/2021    3:50 PM 04/15/2021    2:41 PM 04/14/2021    2:05 PM  Diabetic Labs  HbA1c 4.8 - 5.6 %  14.1  8.9  12    Micro/Creat Ratio 0 - 29 mg/g creat 37       Chol 100 - 199 mg/dL  174    CANCELED   HDL >39 mg/dL  50    CANCELED   Calc LDL 0 - 99 mg/dL  99      Triglycerides 0 - 149 mg/dL  140    CANCELED   Creatinine 0.57 - 1.00 mg/dL  0.95    CANCELED       02/22/2022    3:19 PM 02/22/2022    2:51 PM 02/22/2022    2:48 PM 01/05/2022    3:58 PM 01/05/2022    3:28 PM 01/05/2022    3:27 PM 12/17/2021    2:54 PM  BP/Weight  Systolic BP 655 374 827 078 675 449 201  Diastolic BP 82 72 83 80 78 76 82  Wt. (Lbs)   184.12   185   BMI   34.79 kg/m2   34.96 kg/m2       12/04/2018    2:20 PM 02/14/2018    2:20 PM  Foot/eye exam completion dates  Foot Form Completion Done Done        Cough variant asthma Uncontrolled with chronic cough reported. Trelegy sample x 1 given, pt to see Pulmonary next month

## 2022-02-22 NOTE — Assessment & Plan Note (Signed)
Uncontrolled with chronic cough reported. Trelegy sample x 1 given, pt to see Pulmonary next month

## 2022-02-22 NOTE — Assessment & Plan Note (Signed)
Uncontrolled, refer to Endo and for diabetic ed , pt agrees Start insulin as prescribed Tiffany Jacobs is reminded of the importance of commitment to daily physical activity for 30 minutes or more, as able and the need to limit carbohydrate intake to 30 to 60 grams per meal to help with blood sugar control.   The need to take medication as prescribed, test blood sugar as directed, and to call between visits if there is a concern that blood sugar is uncontrolled is also discussed.   Tiffany Jacobs is reminded of the importance of daily foot exam, annual eye examination, and good blood sugar, blood pressure and cholesterol control.     Latest Ref Rng & Units 12/17/2021    4:52 PM 12/17/2021    4:01 PM 08/05/2021    3:50 PM 04/15/2021    2:41 PM 04/14/2021    2:05 PM  Diabetic Labs  HbA1c 4.8 - 5.6 %  14.1  8.9  12    Micro/Creat Ratio 0 - 29 mg/g creat 37       Chol 100 - 199 mg/dL  174    CANCELED   HDL >39 mg/dL  50    CANCELED   Calc LDL 0 - 99 mg/dL  99      Triglycerides 0 - 149 mg/dL  140    CANCELED   Creatinine 0.57 - 1.00 mg/dL  0.95    CANCELED       02/22/2022    3:19 PM 02/22/2022    2:51 PM 02/22/2022    2:48 PM 01/05/2022    3:58 PM 01/05/2022    3:28 PM 01/05/2022    3:27 PM 12/17/2021    2:54 PM  BP/Weight  Systolic BP 097 [phone] 622  Diastolic BP 82 72 83 80 78 76 82  Wt. (Lbs)   184.12   185   BMI   34.79 kg/m2   34.96 kg/m2       12/04/2018    2:20 PM 02/14/2018    2:20 PM  Foot/eye exam completion dates  Foot Form Completion Done Done

## 2022-02-22 NOTE — Assessment & Plan Note (Addendum)
Controlled, no change in medication DASH diet and commitment to daily physical activity for a minimum of 30 minutes discussed and encouraged, as a part of hypertension management. The importance of attaining a healthy weight is also discussed.     02/22/2022    3:19 PM 02/22/2022    2:51 PM 02/22/2022    2:48 PM 01/05/2022    3:58 PM 01/05/2022    3:28 PM 01/05/2022    3:27 PM 12/17/2021    2:54 PM  BP/Weight  Systolic BP 099 278 004 [phone] 685  Diastolic BP 82 72 83 80 78 76 82  Wt. (Lbs)   184.12   185   BMI   34.79 kg/m2   34.96 kg/m2

## 2022-02-22 NOTE — Assessment & Plan Note (Signed)
  Patient re-educated about  the importance of commitment to a  minimum of 150 minutes of exercise per week as able.  The importance of healthy food choices with portion control discussed, as well as eating regularly and within a 12 hour window most days. The need to choose "clean , green" food 50 to 75% of the time is discussed, as well as to make water the primary drink and set a goal of 64 ounces water daily.       02/22/2022    2:48 PM 01/05/2022    3:27 PM 12/17/2021    2:51 PM  Weight /BMI  Weight 184 lb 1.9 oz 185 lb 183 lb  Height '5\' 1"'$  (1.549 m) '5\' 1"'$  (1.549 m) '5\' 1"'$  (1.549 m)  BMI 34.79 kg/m2 34.96 kg/m2 34.58 kg/m2

## 2022-02-23 ENCOUNTER — Telehealth: Payer: Self-pay | Admitting: Family Medicine

## 2022-02-23 NOTE — Telephone Encounter (Signed)
Patient called to give provider the name of earring dr America Brown, MD [facility] [phone]

## 2022-02-24 NOTE — Telephone Encounter (Signed)
NOTE FAXED.

## 2022-03-10 ENCOUNTER — Telehealth: Payer: Self-pay | Admitting: Family Medicine

## 2022-03-10 DIAGNOSIS — T169XXD Foreign body in ear, unspecified ear, subsequent encounter: Secondary | ICD-10-CM | POA: Diagnosis not present

## 2022-03-10 DIAGNOSIS — R030 Elevated blood-pressure reading, without diagnosis of hypertension: Secondary | ICD-10-CM | POA: Diagnosis not present

## 2022-03-10 NOTE — Telephone Encounter (Signed)
Patient came by office in regard to ear, patient has went for hearing test ands they state that it is something down in patient ear.    Patient is requesting ENT referral . Also wants a call back     Patient wants call after 3 pm

## 2022-03-11 ENCOUNTER — Other Ambulatory Visit: Payer: Self-pay

## 2022-03-11 DIAGNOSIS — Z0110 Encounter for examination of ears and hearing without abnormal findings: Secondary | ICD-10-CM

## 2022-03-11 NOTE — Telephone Encounter (Signed)
Referral placed.

## 2022-03-15 ENCOUNTER — Ambulatory Visit: Payer: Medicare Other | Admitting: Gastroenterology

## 2022-03-22 ENCOUNTER — Encounter: Payer: Self-pay | Admitting: Internal Medicine

## 2022-03-22 ENCOUNTER — Ambulatory Visit: Payer: Medicare Other | Admitting: Internal Medicine

## 2022-03-22 VITALS — BP 128/76 | HR 68 | Temp 98.2°F | Ht 62.5 in | Wt 184.4 lb

## 2022-03-22 DIAGNOSIS — R053 Chronic cough: Secondary | ICD-10-CM | POA: Diagnosis not present

## 2022-03-22 LAB — POCT EXHALED NITRIC OXIDE: FeNO level (ppb): 17

## 2022-03-22 MED ORDER — FAMOTIDINE 20 MG PO TABS: ORAL_TABLET | ORAL | 11 refills | Status: DC

## 2022-03-22 NOTE — Patient Instructions (Addendum)
Stay on Pantoprazole 20 mg  but change to  where you tale it 30-60 min before first meal of the day and add Pepcid 20 mg after super   GERD (REFLUX)  is an extremely common cause of respiratory symptoms just like yours , many times with no obvious heartburn at all.    It can be treated with medication, but also with lifestyle changes including elevation of the head of your bed (ideally with 6 -8inch blocks under the headboard of your bed),  Smoking cessation, avoidance of late meals, excessive alcohol, and avoid fatty foods, chocolate, peppermint, colas, red wine, and acidic juices such as orange juice.  NO MINT OR MENTHOL PRODUCTS SO NO COUGH DROPS  USE SUGARLESS CANDY INSTEAD (Jolley ranchers or Stover's or Life Savers) or even ice chips will also do - the key is to swallow to prevent all throat clearing. NO OIL BASED VITAMINS - use powdered substitutes.  Avoid fish oil when coughing.   Please remember to go to the lab department   for your tests - we will call you with the results when they are available.     Please remember to go to the  x-ray department  @  [facility] for your tests - we will call you with the results when they are available      Return here for flare of cough

## 2022-03-22 NOTE — Progress Notes (Addendum)
Tiffany Jacobs, female    DOB: 11/13/1945    MRN: [id number]   Brief patient profile:  48 yobf  retired CNA quit smoking in 1990 with  h/o childhood asthma but no assoc cough  referred to pulmonary clinic in [place] [facility]  03/22/2022 by Moshe Cipro  for cough x decades no better on trelegy.      History of Present Illness  03/22/2022  Pulmonary/ 1st office eval/ Desi Rowe / [place] [facility]  Chief Complaint  Patient presents with   Consult    Chronic cough has had for years  Dry persistent cough  Having a lot of back pain today   Dyspnea:  denies limiting doe  Cough: maybe once a week / rarely wakes her up  Shortest or longest duration   "can't tell you - maybe 5 sec total" - uses cough drops  Sleep: flat bed one pillow  SABA use: has hfa and neb  02: none   Last bothered her months  PTA - was worse at church when trying to suppress with cough drops but avoids going now and coughs a lot less   No obvious day to day or daytime pattern/variability or assoc excess/ purulent sputum or mucus plugs or hemoptysis or cp or chest tightness, subjective wheeze or overt sinus or hb symptoms.   Sleeping  without nocturnal  or early am exacerbation  of respiratory  c/o's or need for noct saba. Also denies any obvious fluctuation of symptoms with weather or environmental changes or other aggravating or alleviating factors except as outlined above   No unusual exposure hx or h/o childhood pna  or knowledge of premature birth.  Current Allergies, Complete Past Medical History, Past Surgical History, Family History, and Social History were reviewed in [employer] record.  ROS  The following are not active complaints unless bolded Hoarseness, sore throat, dysphagia, dental problems, itching, sneezing,  nasal congestion or discharge of excess mucus or purulent secretions, ear ache,   fever, chills, sweats, unintended wt loss or wt gain, classically pleuritic or exertional cp,  orthopnea  pnd or arm/hand swelling  or leg swelling, presyncope, palpitations, abdominal pain, anorexia, nausea, vomiting, diarrhea  or change in bowel habits or change in bladder habits, change in stools or change in urine, dysuria, hematuria,  rash, arthralgias, visual complaints, headache, numbness, weakness or ataxia or problems with walking or coordination,  change in mood or  memory.           Past Medical History:  Diagnosis Date   Arthritis    Asthma 1963   Chest pain with high risk for cardiac etiology 08/21/2017   Diabetes ([place])    Diabetes mellitus, type 2 ([place]) 2004   GERD (gastroesophageal reflux disease) 2004   Hypertension 2004   Low back pain    Obesity    RAD (reactive airway disease)    Sciatica    Seasonal allergies     Outpatient Medications Prior to Visit -   - NOTE:   Unable to verify as accurately reflecting what pt takes    Medication Sig Dispense Refill   albuterol (PROVENTIL) (2.5 MG/3ML) 0.083% nebulizer solution Take 3 mLs (2.5 mg total) by nebulization every 6 (six) hours as needed for wheezing or shortness of breath. 75 mL 5   Alcohol Swabs (B-D SINGLE USE SWABS REGULAR) PADS Use to test as directed 100 each 6   amlodipine-atorvastatin (CADUET) 10-20 MG tablet Take 1 tablet by mouth daily. 90 tablet 3  blood glucose meter kit and supplies Two times daily testing (dispense meter based on insurance preference) dx e11.65 1 each 0   ferrous sulfate 325 (65 FE) MG tablet Take 325 mg by mouth daily with breakfast.        1 each 0   gabapentin (NEURONTIN) 300 MG capsule Take 1 capsule (300 mg total) by mouth at bedtime. 90 capsule 1   glipiZIDE (GLUCOTROL XL) 10 MG 24 hr tablet Take two tablets by mouth every morning with breakfast 60 tablet 2   glucose blood test strip Use as instructed once daily dx E11.9 50 each 5   hydrALAZINE (APRESOLINE) 50 MG tablet Take one and a half tablets by mouth two times daily, for blood pressure 60 tablet 5   insulin NPH-regular Human  (NOVOLIN 70/30 RELION) (70-30) 100 UNIT/ML injection Inject 10 Units into the skin 2 (two) times daily with a meal. 10 mL 11   Insulin Syringe-Needle U-100 (BD SAFETYGLIDE INSULIN SYRINGE) 31G X 15/64" 0.3 ML MISC Use to self administer insulin twice daily. 100 each 3   latanoprost (XALATAN) 0.005 % ophthalmic solution SMARTSIG:1 Drop(s) In Eye(s) Every Evening     metoprolol tartrate (LOPRESSOR) 25 MG tablet Take 1 tablet (25 mg total) by mouth 2 (two) times daily. 180 tablet 3   nitroGLYCERIN (NITROSTAT) 0.4 MG SL tablet DISSOLVE ONE TABLET UNDER THE TONGUE EVERY 5 MINUTES AS NEEDED FOR CHEST PAIN. DO NOT EXCEED A TOTAL OF 3 DOSES IN 15 MINUTES  0   pantoprazole (PROTONIX) 20 MG tablet Take 1 tablet (20 mg total) by mouth daily. 90 tablet 1   spironolactone (ALDACTONE) 25 MG tablet TAKE ONE TABLET BY MOUTH ONCE DAILY. 90 tablet 0   timolol (TIMOPTIC) 0.5 % ophthalmic solution 1 drop 2 (two) times daily.     TRUEplus Lancets 33G MISC TEST BLOOD SUGAR THREE TIMES DAILY 300 each 0   No facility-administered medications prior to visit.     Objective:     BP 128/76   Pulse 68   Temp 98.2 F (36.8 C)   Ht 5' 2.5" (1.588 m)   Wt 184 lb 6.4 oz (83.6 kg)   SpO2 96% Comment: ra  BMI 33.19 kg/m   SpO2: 96 % (ra)  Somber amb bf nad   HEENT : Oropharynx  clear/ edentulous      Nasal turbinates moderate non-specific swelling.    NECK :  without  apparent JVD/ palpable Nodes/TM    LUNGS: no acc muscle use,  Nl contour chest which is clear to A and P bilaterally without cough on insp or exp maneuvers   CV:  RRR  no s3 or murmur or increase in P2, and no edema   ABD:  soft and nontender with nl inspiratory excursion in the supine position. No bruits or organomegaly appreciated   MS:  Nl gait/ ext warm without deformities Or obvious joint restrictions  calf tenderness, cyanosis or clubbing    SKIN: warm and dry without lesions    NEURO:  alert, approp, nl sensorium with  no motor or  cerebellar deficits apparent.       Assessment   Chronic cough Onset ? Early 2000's p d/c smoking 1990 - FENO  03/22/2022   =  17 - Allergy screen 03/22/2022 >  Eos 0. /  IgE    The most common causes of chronic cough in immunocompetent adults include the following: upper airway cough syndrome (UACS), previously referred to as postnasal drip syndrome (  PNDS), which is caused by variety of rhinosinus conditions; (2) asthma; (3) GERD; (4) chronic bronchitis from cigarette smoking or other inhaled environmental irritants; (5) nonasthmatic eosinophilic bronchitis; and (6) bronchiectasis.   These conditions, singly or in combination, have accounted for up to 94% of the causes of chronic cough in prospective studies.   Other conditions have constituted no >6% of the causes in prospective studies These have included bronchogenic carcinoma, chronic interstitial pneumonia, sarcoidosis, left ventricular failure, ACEI-induced cough, and aspiration from a condition associated with pharyngeal dysfunction.    Chronic cough is often simultaneously caused by more than one condition. A single cause has been found from 38 to 82% of the time, multiple causes from 18 to 62%. Multiply caused cough has been the result of three diseases up to 42% of the time.       Most likely (based on pattern this is a form of Upper airway cough syndrome (previously labeled PNDS),  is so named because it's frequently impossible to sort out how much is  CR/sinusitis with freq throat clearing (which can be related to primary GERD)   vs  causing  secondary (" extra esophageal")  GERD from wide swings in gastric pressure that occur with throat clearing, often  promoting self use of mint and menthol lozenges that reduce the lower esophageal sphincter tone and exacerbate the problem further in a cyclical fashion.   These are the same pts (now being labeled as having "irritable larynx syndrome" by some cough centers) who not infrequently  have a history of having failed to tolerate ace inhibitors( as is the case here)   dry powder inhalers (as is the case here) or biphosphonates or report having atypical/extraesophageal reflux symptoms that don't respond to standard doses of PPI (which may be the case here)  and are easily confused as having aecopd or asthma flares by even experienced allergists/ pulmonologists (myself included).   Hard to believe this is cough variant asthma that worsened p stopped smoking or any form of allergic asthma based on lack to pattern or Pos FENO so rec   Max rx for gerd with ppi and h2  Jerrye Bushy diet Consider pushing gabapentin higher in daytime eg 300 mg bid   F/u prn flare unless cxr or labs abn today         Each maintenance medication was reviewed in detail including emphasizing most importantly the difference between maintenance and prns and under what circumstances the prns are to be triggered using an action plan format where appropriate.  Total time for H and P, chart review, counseling, reviewing hfa/neb/dpi device(s) and generating customized AVS unique to this office visit / same day charting > 45 min new pt eval with refractory chronic resp symptom of unknown origin           Christinia Gully, MD 03/22/2022

## 2022-03-22 NOTE — Assessment & Plan Note (Addendum)
Onset ? Early 2000's p d/c smoking 1990 - FENO  03/22/2022   =  17 - Allergy screen 03/22/2022 >  Eos 0. 2/  IgE  1041 > allergy referral   The most common causes of chronic cough in immunocompetent adults include the following: upper airway cough syndrome (UACS), previously referred to as postnasal drip syndrome (PNDS), which is caused by variety of rhinosinus conditions; (2) asthma; (3) GERD; (4) chronic bronchitis from cigarette smoking or other inhaled environmental irritants; (5) nonasthmatic eosinophilic bronchitis; and (6) bronchiectasis.   These conditions, singly or in combination, have accounted for up to 94% of the causes of chronic cough in prospective studies.   Other conditions have constituted no >6% of the causes in prospective studies These have included bronchogenic carcinoma, chronic interstitial pneumonia, sarcoidosis, left ventricular failure, ACEI-induced cough, and aspiration from a condition associated with pharyngeal dysfunction.    Chronic cough is often simultaneously caused by more than one condition. A single cause has been found from 38 to 82% of the time, multiple causes from 18 to 62%. Multiply caused cough has been the result of three diseases up to 42% of the time.       Most likely (based on pattern this is a form of Upper airway cough syndrome (previously labeled PNDS),  is so named because it's frequently impossible to sort out how much is  CR/sinusitis with freq throat clearing (which can be related to primary GERD)   vs  causing  secondary (" extra esophageal")  GERD from wide swings in gastric pressure that occur with throat clearing, often  promoting self use of mint and menthol lozenges that reduce the lower esophageal sphincter tone and exacerbate the problem further in a cyclical fashion.   These are the same pts (now being labeled as having "irritable larynx syndrome" by some cough centers) who not infrequently have a history of having failed to tolerate ace  inhibitors( as is the case here)   dry powder inhalers (as is the case here) or biphosphonates or report having atypical/extraesophageal reflux symptoms that don't respond to standard doses of PPI (which may be the case here)  and are easily confused as having aecopd or asthma flares by even experienced allergists/ pulmonologists (myself included).   Hard to believe this is cough variant asthma that worsened p stopped smoking or any form of allergic asthma based on lack to pattern or Pos FENO so rec   Max rx for gerd with ppi and h2  Jerrye Bushy diet Consider pushing gabapentin higher in daytime eg 300 mg bid   F/u prn flare unless cxr or labs abn today         Each maintenance medication was reviewed in detail including emphasizing most importantly the difference between maintenance and prns and under what circumstances the prns are to be triggered using an action plan format where appropriate.  Total time for H and P, chart review, counseling, reviewing hfa/neb/dpi device(s) and generating customized AVS unique to this office visit / same day charting > 45 min new pt eval with refractory chronic resp symptom of unknown origin

## 2022-03-24 LAB — CBC WITH DIFFERENTIAL/PLATELET
Basophils Absolute: 0.1 10*3/uL (ref 0.0–0.2)
Basos: 1 %
EOS (ABSOLUTE): 0.2 10*3/uL (ref 0.0–0.4)
Eos: 3 %
Hematocrit: 40.2 % (ref 34.0–46.6)
Hemoglobin: 13.4 g/dL (ref 11.1–15.9)
Immature Grans (Abs): 0 10*3/uL (ref 0.0–0.1)
Immature Granulocytes: 0 %
Lymphocytes Absolute: 1.5 10*3/uL (ref 0.7–3.1)
Lymphs: 21 %
MCH: 28.2 pg (ref 26.6–33.0)
MCHC: 33.3 g/dL (ref 31.5–35.7)
MCV: 85 fL (ref 79–97)
Monocytes Absolute: 0.5 10*3/uL (ref 0.1–0.9)
Monocytes: 7 %
Neutrophils Absolute: 4.9 10*3/uL (ref 1.4–7.0)
Neutrophils: 68 %
Platelets: 316 10*3/uL (ref 150–450)
RBC: 4.76 x10E6/uL (ref 3.77–5.28)
RDW: 12.2 % (ref 11.7–15.4)
WBC: 7.2 10*3/uL (ref 3.4–10.8)

## 2022-03-24 LAB — IGE: IgE (Immunoglobulin E), Serum: 1041 IU/mL — ABNORMAL HIGH (ref 6–495)

## 2022-04-04 ENCOUNTER — Other Ambulatory Visit: Payer: Self-pay

## 2022-04-04 DIAGNOSIS — R768 Other specified abnormal immunological findings in serum: Secondary | ICD-10-CM

## 2022-04-07 ENCOUNTER — Emergency Department (HOSPITAL_COMMUNITY)
Admission: EM | Admit: 2022-04-07 | Discharge: 2022-04-07 | Disposition: A | Discharge status: Home / Self Care | Payer: Medicare Other | Attending: Emergency Medicine | Admitting: Emergency Medicine

## 2022-04-07 ENCOUNTER — Emergency Department (HOSPITAL_COMMUNITY): Payer: Medicare Other

## 2022-04-07 ENCOUNTER — Other Ambulatory Visit: Payer: Self-pay

## 2022-04-07 ENCOUNTER — Encounter (HOSPITAL_COMMUNITY): Payer: Self-pay | Admitting: *Deleted

## 2022-04-07 DIAGNOSIS — Z91040 Latex allergy status: Secondary | ICD-10-CM | POA: Diagnosis not present

## 2022-04-07 DIAGNOSIS — R053 Chronic cough: Secondary | ICD-10-CM | POA: Diagnosis not present

## 2022-04-07 DIAGNOSIS — Z79899 Other long term (current) drug therapy: Secondary | ICD-10-CM | POA: Insufficient documentation

## 2022-04-07 DIAGNOSIS — I10 Essential (primary) hypertension: Secondary | ICD-10-CM | POA: Diagnosis not present

## 2022-04-07 DIAGNOSIS — Z794 Long term (current) use of insulin: Secondary | ICD-10-CM | POA: Diagnosis not present

## 2022-04-07 DIAGNOSIS — R059 Cough, unspecified: Secondary | ICD-10-CM | POA: Diagnosis not present

## 2022-04-07 NOTE — ED Provider Notes (Signed)
[facility] Provider Note   CSN: [id number] Arrival date & time: 04/07/22  1011     History  Chief Complaint  Patient presents with   Cough    Tiffany Jacobs is a 77 y.o. female, history of hypertension, who presents to the ED secondary to a persistent cough that is been going on for 3 to 4 years, she states that her PCP told her to come to the hospital to get a chest x-ray and that is why she is here.  She has no acute complaints.  Has not had any weight loss, fevers, chills, increased shortness of breath, blood in her sputum.  She states the cough is dry, denies any bitter taste in her mouth.  Denies any chest pain.     Home Medications Prior to Admission medications   Medication Sig Start Date End Date Taking? Authorizing Provider  albuterol (PROVENTIL) (2.5 MG/3ML) 0.083% nebulizer solution Take 3 mLs (2.5 mg total) by nebulization every 6 (six) hours as needed for wheezing or shortness of breath. 08/05/21   Fayrene Helper, MD  Alcohol Swabs (B-D SINGLE USE SWABS REGULAR) PADS Use to test as directed 03/16/20   Fayrene Helper, MD  amlodipine-atorvastatin (CADUET) 10-20 MG tablet Take 1 tablet by mouth daily. 08/05/21   Fayrene Helper, MD  blood glucose meter kit and supplies Two times daily testing (dispense meter based on insurance preference) dx e11.65 09/02/19   Fayrene Helper, MD  famotidine (PEPCID) 20 MG tablet One after supper 03/22/22   Tanda Rockers, MD  ferrous sulfate 325 (65 FE) MG tablet Take 325 mg by mouth daily with breakfast.    [provider]  Fluticasone-Umeclidin-Vilant (TRELEGY ELLIPTA) 100-62.5-25 MCG/ACT AEPB Inhale 1 puff into the lungs daily. 02/22/22   Fayrene Helper, MD  gabapentin (NEURONTIN) 300 MG capsule Take 1 capsule (300 mg total) by mouth at bedtime. 01/04/22   Fayrene Helper, MD  glipiZIDE (GLUCOTROL XL) 10 MG 24 hr tablet Take two tablets by mouth every morning  with breakfast 12/17/21   Fayrene Helper, MD  glucose blood test strip Use as instructed once daily dx E11.9 03/16/20   Fayrene Helper, MD  hydrALAZINE (APRESOLINE) 50 MG tablet Take one and a half tablets by mouth two times daily, for blood pressure 02/22/22   Fayrene Helper, MD  insulin NPH-regular Human (NOVOLIN 70/30 RELION) (70-30) 100 UNIT/ML injection Inject 10 Units into the skin 2 (two) times daily with a meal. 02/22/22   Fayrene Helper, MD  Insulin Syringe-Needle U-100 (BD SAFETYGLIDE INSULIN SYRINGE) 31G X 15/64" 0.3 ML MISC Use to self administer insulin twice daily. 01/05/22   Fayrene Helper, MD  latanoprost (XALATAN) 0.005 % ophthalmic solution SMARTSIG:1 Drop(s) In Eye(s) Every Evening 10/31/19   [provider]  metoprolol tartrate (LOPRESSOR) 25 MG tablet Take 1 tablet (25 mg total) by mouth 2 (two) times daily. 08/05/21   Fayrene Helper, MD  nitroGLYCERIN (NITROSTAT) 0.4 MG SL tablet DISSOLVE ONE TABLET UNDER THE TONGUE EVERY 5 MINUTES AS NEEDED FOR CHEST PAIN. DO NOT EXCEED A TOTAL OF 3 DOSES IN 15 MINUTES 08/11/17   [provider]  pantoprazole (PROTONIX) 20 MG tablet Take 1 tablet (20 mg total) by mouth daily. 12/19/21   Fayrene Helper, MD  spironolactone (ALDACTONE) 25 MG tablet TAKE ONE TABLET BY MOUTH ONCE DAILY. 12/10/21   Fayrene Helper, MD  timolol (TIMOPTIC) 0.5 %  ophthalmic solution 1 drop 2 (two) times daily. 01/04/22   [provider]  TRUEplus Lancets 33G MISC TEST BLOOD SUGAR THREE TIMES DAILY 01/08/21   Fayrene Helper, MD      Allergies    Ace inhibitors and Latex    Review of Systems   Review of Systems  Respiratory:  Positive for cough.   Cardiovascular:  Negative for chest pain.    Physical Exam Updated Vital Signs BP (!) 159/80   Pulse 89   Temp 97.9 F (36.6 C)   Resp 20   Ht 5' 2.5" (1.588 m)   Wt 83.5 kg   SpO2 95%   BMI 33.12 kg/m  Physical Exam Vitals and nursing note  reviewed.  Constitutional:      General: She is not in acute distress.    Appearance: She is well-developed.  HENT:     Head: Normocephalic and atraumatic.  Eyes:     Conjunctiva/sclera: Conjunctivae normal.  Cardiovascular:     Rate and Rhythm: Normal rate and regular rhythm.     Heart sounds: No murmur heard. Pulmonary:     Effort: Pulmonary effort is normal. No respiratory distress.     Breath sounds: Normal breath sounds.  Abdominal:     Palpations: Abdomen is soft.     Tenderness: There is no abdominal tenderness.  Musculoskeletal:        General: No swelling.     Cervical back: Neck supple.  Skin:    General: Skin is warm and dry.     Capillary Refill: Capillary refill takes less than 2 seconds.  Neurological:     Mental Status: She is alert.  Psychiatric:        Mood and Affect: Mood normal.     ED Results / Procedures / Treatments   Labs (all labs ordered are listed, but only abnormal results are displayed) Labs Reviewed - No data to display  EKG None  Radiology DG Chest 2 View  Result Date: 04/07/2022 CLINICAL DATA:  Cough EXAM: CHEST - 2 VIEW COMPARISON:  05/19/15 CXR FINDINGS: The heart size and mediastinal contours are within normal limits. Likely left basilar atelectasis. The visualized skeletal structures are unremarkable. Degenerative changes of the bilateral AC joints. IMPRESSION: Likely left basilar atelectasis. Electronically Signed   By: Marin Roberts M.D.   On: 04/07/2022 11:50    Procedures Procedures    Medications Ordered in ED Medications - No data to display  ED Course/ Medical Decision Making/ A&P                             Medical Decision Making Patient here for chest x-ray per PCP, states that she was supposed to come here per PCP, for chest x-ray.  I discussed with registration, they states she has no outpatient test ordered, so she needs to go through the ER.  She denies any chest pain, weight loss, fever, chills.  States that she  has been having a dry cough that is been going on for 3 to 4 years.  Denies any ACE inhibitor use, or ARBs.  States nothing makes the cough better.  Denies any acute issues.  We will obtain a chest x-ray per her request  Amount and/or Complexity of Data Reviewed Radiology: ordered.    Details: Chest x-ray shows left lower lobe atelectasis. Discussion of management or test interpretation with external provider(s): She does state that she did smoke, she may  have COPD, it is unclear at this time, this issue appears to be chronic, she has no acute concerns.  We discussed further evaluation for this cough, and she declined she states she would like to go home.  Return precautions were emphasized, and she voiced understanding.   Final Clinical Impression(s) / ED Diagnoses Final diagnoses:  Chronic cough    Rx / DC Orders ED Discharge Orders     None         Lynann Demetrius, Si Gaul, PA 04/07/22 1231    Sherwood Gambler, MD 04/08/22 [phone]

## 2022-04-07 NOTE — Discharge Instructions (Signed)
Please follow-up with your primary care doctor for further evaluation.  Your chest x-ray was clear today, and you declined any further evaluation.  If you have any fever, weight loss, chills, bloody sputum please return to the ER.

## 2022-04-07 NOTE — ED Triage Notes (Signed)
Pt states her pulmonary doctor, Dr. Melvyn Novas, sent her here to get a xray of her lungs  Pt having persistent cough and does not cough anything up

## 2022-04-08 ENCOUNTER — Other Ambulatory Visit: Payer: Self-pay

## 2022-04-08 ENCOUNTER — Telehealth: Payer: Self-pay | Admitting: Family Medicine

## 2022-04-08 DIAGNOSIS — M5010 Cervical disc disorder with radiculopathy, unspecified cervical region: Secondary | ICD-10-CM

## 2022-04-08 MED ORDER — GABAPENTIN 300 MG PO CAPS: 300.0000 mg | ORAL_CAPSULE | Freq: Every day | ORAL | 1 refills | Status: DC

## 2022-04-08 NOTE — Telephone Encounter (Signed)
Patient called, she is requesting a refill for Gabapentin '300mg'$  and also packets w/all her doses in it to be sent to [facility].  Pt's # [phone]

## 2022-04-08 NOTE — Telephone Encounter (Signed)
Medication sent to [facility] and requested all medications be bubble packed.

## 2022-04-14 ENCOUNTER — Telehealth: Payer: Self-pay

## 2022-04-14 NOTE — Telephone Encounter (Signed)
        Patient  visited [facility] on 2/1   Telephone encounter attempt : 1st    Unable to leave a message     [place], [place] [phone] [address], [place], [place] [address] Phone: [phone] Email: [email or web address]

## 2022-04-15 ENCOUNTER — Telehealth: Payer: Self-pay

## 2022-04-15 NOTE — Telephone Encounter (Signed)
        Patient  visited [facility] on 2/1    Telephone encounter attempt :2nd    A HIPAA compliant voice message was left requesting a return call.  Instructed patient to call back.    Myalynn Lingle [facility], [place] [phone] [address], , [place] [address] Phone: [phone] Email: [email or web address]       

## 2022-04-19 NOTE — Progress Notes (Deleted)
GI Office Note    Referring Provider: Fayrene Helper, MD Primary Care Physician:  Fayrene Helper, MD  Primary Gastroenterologist:  Chief Complaint   No chief complaint on file.    History of Present Illness   Tiffany Jacobs is a 77 y.o. female presenting today       Colonoscopy 10/2016: -three 5-33m polyps in descending colon at splenic flexure -one 370mpolyp mid descending colon -three 5-18m70molyps rectum -external and internal hemorrhoids -both adenomas (4) and hyperplastic polyps (3) -repeat colonoscopy in 3 years  Medications   Current Outpatient Medications  Medication Sig Dispense Refill   albuterol (PROVENTIL) (2.5 MG/3ML) 0.083% nebulizer solution Take 3 mLs (2.5 mg total) by nebulization every 6 (six) hours as needed for wheezing or shortness of breath. 75 mL 5   Alcohol Swabs (B-D SINGLE USE SWABS REGULAR) PADS Use to test as directed 100 each 6   amlodipine-atorvastatin (CADUET) 10-20 MG tablet Take 1 tablet by mouth daily. 90 tablet 3   blood glucose meter kit and supplies Two times daily testing (dispense meter based on insurance preference) dx e11.65 1 each 0   famotidine (PEPCID) 20 MG tablet One after supper 30 tablet 11   ferrous sulfate 325 (65 FE) MG tablet Take 325 mg by mouth daily with breakfast.     Fluticasone-Umeclidin-Vilant (TRELEGY ELLIPTA) 100-62.5-25 MCG/ACT AEPB Inhale 1 puff into the lungs daily. 1 each 0   gabapentin (NEURONTIN) 300 MG capsule Take 1 capsule (300 mg total) by mouth at bedtime. 90 capsule 1   glipiZIDE (GLUCOTROL XL) 10 MG 24 hr tablet Take two tablets by mouth every morning with breakfast 60 tablet 2   glucose blood test strip Use as instructed once daily dx E11.9 50 each 5   hydrALAZINE (APRESOLINE) 50 MG tablet Take one and a half tablets by mouth two times daily, for blood pressure 60 tablet 5   insulin NPH-regular Human (NOVOLIN 70/30 RELION) (70-30) 100 UNIT/ML injection Inject 10 Units into the  skin 2 (two) times daily with a meal. 10 mL 11   Insulin Syringe-Needle U-100 (BD SAFETYGLIDE INSULIN SYRINGE) 31G X 15/64" 0.3 ML MISC Use to self administer insulin twice daily. 100 each 3   latanoprost (XALATAN) 0.005 % ophthalmic solution SMARTSIG:1 Drop(s) In Eye(s) Every Evening     metoprolol tartrate (LOPRESSOR) 25 MG tablet Take 1 tablet (25 mg total) by mouth 2 (two) times daily. 180 tablet 3   nitroGLYCERIN (NITROSTAT) 0.4 MG SL tablet DISSOLVE ONE TABLET UNDER THE TONGUE EVERY 5 MINUTES AS NEEDED FOR CHEST PAIN. DO NOT EXCEED A TOTAL OF 3 DOSES IN 15 MINUTES  0   pantoprazole (PROTONIX) 20 MG tablet Take 1 tablet (20 mg total) by mouth daily. 90 tablet 1   spironolactone (ALDACTONE) 25 MG tablet TAKE ONE TABLET BY MOUTH ONCE DAILY. 90 tablet 0   timolol (TIMOPTIC) 0.5 % ophthalmic solution 1 drop 2 (two) times daily.     TRUEplus Lancets 33G MISC TEST BLOOD SUGAR THREE TIMES DAILY 300 each 0   No current facility-administered medications for this visit.    Allergies   Allergies as of 04/20/2022 - Review Complete 04/07/2022  Allergen Reaction Noted   Ace inhibitors Cough 11/04/2011   Latex Itching and Rash 05/04/2014    Past Medical History   Past Medical History:  Diagnosis Date   Arthritis    Asthma 1963   Chest pain with high risk for cardiac etiology 08/21/2017  Diabetes ([place])    Diabetes mellitus, type 2 ([place]) 2004   GERD (gastroesophageal reflux disease) 2004   Hypertension 2004   Low back pain    Obesity    RAD (reactive airway disease)    Sciatica    Seasonal allergies     Past Surgical History   Past Surgical History:  Procedure Laterality Date   ACROMIO-CLAVICULAR JOINT REPAIR Right 11/09/2012   Procedure: ACROMIO-CLAVICULAR JOINT REPAIR;  Surgeon: Carole Civil, MD;  Location: [facility];  Service: Orthopedics;  Laterality: Right;   COLONOSCOPY N/A 10/10/2016   Procedure: COLONOSCOPY;  Surgeon: Danie Binder, MD;  [facility];   Service: Endoscopy;  Laterality: N/A;  10:30   EYE SURGERY Right 05/09/2018   cataract   EYE SURGERY Left 2021   cataract   left breast biopsy for benign disease     SHOULDER ACROMIOPLASTY Right 11/09/2012   Procedure: RIGHT SHOULDER ACROMIOPLASTY;  Surgeon: Carole Civil, MD;  Location: [facility];  Service: Orthopedics;  Laterality: Right;   SHOULDER OPEN ROTATOR CUFF REPAIR Right 11/09/2012   Procedure: ROTATOR CUFF REPAIR Right SHOULDER OPEN;  Surgeon: Carole Civil, MD;  Location: [facility];  Service: Orthopedics;  Laterality: Right;   total knee arthroplasty left  02/01/2005   Dr. Aline Brochure   TUBAL LIGATION  1970    Past Family History   Family History  Problem Relation Age of Onset   Kidney failure Father    Kidney disease Father    Stroke Brother 75       used drugs , stroke and heart disease   Diabetes Brother    Hypertension Brother    Cancer Daughter 73       breast    Diabetes Mother    Asthma Other    Lung disease Other    Cancer Other    Heart disease Other    Arthritis Other    Colon cancer Neg Hx    Colon polyps Neg Hx     Past Social History   Social History   Socioeconomic History   Marital status: Married    Spouse name: Jeneen Rinks    Number of children: 7   Years of education: 8   Highest education level: GED or equivalent  Occupational History   Occupation: disabled   Tobacco Use   Smoking status: Former    Packs/day: 0.50    Years: 37.00    Total pack years: 18.50    Types: Cigarettes    Quit date: 10/12/1988    Years since quitting: 33.5   Smokeless tobacco: Never  Vaping Use   Vaping Use: Never used  Substance and Sexual Activity   Alcohol use: No   Drug use: No   Sexual activity: Not Currently    Birth control/protection: Post-menopausal  Other Topics Concern   Not on file  Social History Narrative   Not on file   Social Determinants of Health   Financial Resource Strain: Low Risk  (01/07/2021)   Overall Financial Resource  Strain (CARDIA)    Difficulty of Paying Living Expenses: Not hard at all  Food Insecurity: No Food Insecurity (01/07/2021)   Hunger Vital Sign    Worried About Running Out of Food in the Last Year: Never true    Ran Out of Food in the Last Year: Never true  Transportation Needs: No Transportation Needs (01/07/2021)   PRAPARE - Transportation    Lack of Transportation (Medical): No    Lack of  Transportation (Non-Medical): No  Physical Activity: Sufficiently Active (01/07/2021)   Exercise Vital Sign    Days of Exercise per Week: 7 days    Minutes of Exercise per Session: 60 min  Stress: No Stress Concern Present (01/06/2020)   [place]    Feeling of Stress : Not at all  Social Connections: Socially Isolated (01/07/2021)   Social Connection and Isolation Panel [NHANES]    Frequency of Communication with Friends and Family: More than three times a week    Frequency of Social Gatherings with Friends and Family: More than three times a week    Attends Religious Services: Never    Marine scientist or Organizations: No    Attends Archivist Meetings: Never    Marital Status: Widowed  Intimate Partner Violence: Not At Risk (01/07/2021)   Humiliation, Afraid, Rape, and Kick questionnaire    Fear of Current or Ex-Partner: No    Emotionally Abused: No    Physically Abused: No    Sexually Abused: No    Review of Systems   General: Negative for anorexia, weight loss, fever, chills, fatigue, weakness. Eyes: Negative for vision changes.  ENT: Negative for hoarseness, difficulty swallowing , nasal congestion. CV: Negative for chest pain, angina, palpitations, dyspnea on exertion, peripheral edema.  Respiratory: Negative for dyspnea at rest, dyspnea on exertion, cough, sputum, wheezing.  GI: See history of present illness. GU:  Negative for dysuria, hematuria, urinary incontinence, urinary frequency, nocturnal  urination.  MS: Negative for joint pain, low back pain.  Derm: Negative for rash or itching.  Neuro: Negative for weakness, abnormal sensation, seizure, frequent headaches, memory loss,  confusion.  Psych: Negative for anxiety, depression, suicidal ideation, hallucinations.  Endo: Negative for unusual weight change.  Heme: Negative for bruising or bleeding. Allergy: Negative for rash or hives.  Physical Exam   There were no vitals taken for this visit.   General: Well-nourished, well-developed in no acute distress.  Head: Normocephalic, atraumatic.   Eyes: Conjunctiva pink, no icterus. Mouth: Oropharyngeal mucosa moist and pink , no lesions erythema or exudate. Neck: Supple without thyromegaly, masses, or lymphadenopathy.  Lungs: Clear to auscultation bilaterally.  Heart: Regular rate and rhythm, no murmurs rubs or gallops.  Abdomen: Bowel sounds are normal, nontender, nondistended, no hepatosplenomegaly or masses,  no abdominal bruits or hernia, no rebound or guarding.   Rectal: *** Extremities: No lower extremity edema. No clubbing or deformities.  Neuro: Alert and oriented x 4 , grossly normal neurologically.  Skin: Warm and dry, no rash or jaundice.   Psych: Alert and cooperative, normal mood and affect.  Labs   *** Imaging Studies   DG Chest 2 View  Result Date: 04/07/2022 CLINICAL DATA:  Cough EXAM: CHEST - 2 VIEW COMPARISON:  05/19/15 CXR FINDINGS: The heart size and mediastinal contours are within normal limits. Likely left basilar atelectasis. The visualized skeletal structures are unremarkable. Degenerative changes of the bilateral AC joints. IMPRESSION: Likely left basilar atelectasis. Electronically Signed   By: Marin Roberts M.D.   On: 04/07/2022 11:50    Assessment       PLAN   ***   Laureen Ochs. Bobby Rumpf, [place], [facility]

## 2022-04-20 ENCOUNTER — Ambulatory Visit: Payer: Medicare Other | Admitting: Gastroenterology

## 2022-04-20 ENCOUNTER — Telehealth: Payer: Self-pay | Admitting: Family Medicine

## 2022-04-20 NOTE — Telephone Encounter (Signed)
Patient called take all medicine and put in daily package.  Patient is almost out . Per patient question return her call.  Patient says it about 7 that goes in the pouch but she has 11 medications. Any questions contact patient [phone].  Pharmacy: [facility]

## 2022-04-20 NOTE — Telephone Encounter (Signed)
Spoke with patient and pharmacy. Patient is current with all meds, does not need any refilled at this time. Pharmacy will work on getting all scripts bubble packed.

## 2022-05-04 DIAGNOSIS — L299 Pruritus, unspecified: Secondary | ICD-10-CM | POA: Diagnosis not present

## 2022-05-04 DIAGNOSIS — T161XXA Foreign body in right ear, initial encounter: Secondary | ICD-10-CM | POA: Diagnosis not present

## 2022-05-04 DIAGNOSIS — T162XXA Foreign body in left ear, initial encounter: Secondary | ICD-10-CM | POA: Diagnosis not present

## 2022-05-05 ENCOUNTER — Encounter: Payer: Self-pay | Admitting: Radiology

## 2022-05-09 NOTE — Progress Notes (Signed)
GI Office Note    Referring Provider: Fayrene Helper, MD Primary Care Physician:  Fayrene Helper, MD  Primary Gastroenterologist: Elon Alas. Abbey Chatters, DO   Chief Complaint   Chief Complaint  Patient presents with   Colonoscopy    History of Present Illness   Tiffany Jacobs is a 77 y.o. female presenting today to schedule surveillance colonoscopy. She was scheduled back in 2022 but lost her insurance and decided to cancel.  Trying to loose weight. Trying to eat better. Down 13 pounds in the past one year. Appetite good. No heartburn. BM regular. No melena, brbpr. No abdominal pain. Patient did not bring her medication list and is not sure what she is taking. We have requested list from the pharmacy. We discussed, would be reasonable to plan for one last colonoscopy given overall health.  Colonoscopy 10/2016: -three 5-13m polyps removed from descending colon at splenic flexure -one 384mpolyp removed from mid descending colon -three 5-61m54molyps removed from rectum -interna/external hemorrhoids -4 simple adenomas, recommend colonoscopy in 3 years    Medications   Current Outpatient Medications  Medication Sig Dispense Refill   albuterol (PROVENTIL) (2.5 MG/3ML) 0.083% nebulizer solution Take 3 mLs (2.5 mg total) by nebulization every 6 (six) hours as needed for wheezing or shortness of breath. 75 mL 5   Alcohol Swabs (B-D SINGLE USE SWABS REGULAR) PADS Use to test as directed 100 each 6   amlodipine-atorvastatin (CADUET) 10-20 MG tablet Take 1 tablet by mouth daily. 90 tablet 3   blood glucose meter kit and supplies Two times daily testing (dispense meter based on insurance preference) dx e11.65 1 each 0   famotidine (PEPCID) 20 MG tablet One after supper 30 tablet 11   ferrous sulfate 325 (65 FE) MG tablet Take 325 mg by mouth daily with breakfast.     Fluocinolone Acetonide 0.01 % OIL Instill 5 drops twice daily into both ear(s) for 7 days. Then stop and use  as needed for itching.     gabapentin (NEURONTIN) 300 MG capsule Take 1 capsule (300 mg total) by mouth at bedtime. 90 capsule 1   glipiZIDE (GLUCOTROL XL) 10 MG 24 hr tablet Take two tablets by mouth every morning with breakfast 60 tablet 2   glucose blood test strip Use as instructed once daily dx E11.9 50 each 5   Insulin Syringe-Needle U-100 (BD SAFETYGLIDE INSULIN SYRINGE) 31G X 15/64" 0.3 ML MISC Use to self administer insulin twice daily. 100 each 3   latanoprost (XALATAN) 0.005 % ophthalmic solution SMARTSIG:1 Drop(s) In Eye(s) Every Evening     metoprolol tartrate (LOPRESSOR) 25 MG tablet Take 1 tablet (25 mg total) by mouth 2 (two) times daily. 180 tablet 3   nitroGLYCERIN (NITROSTAT) 0.4 MG SL tablet DISSOLVE ONE TABLET UNDER THE TONGUE EVERY 5 MINUTES AS NEEDED FOR CHEST PAIN. DO NOT EXCEED A TOTAL OF 3 DOSES IN 15 MINUTES  0   pantoprazole (PROTONIX) 20 MG tablet Take 1 tablet (20 mg total) by mouth daily. 90 tablet 1   spironolactone (ALDACTONE) 25 MG tablet TAKE ONE TABLET BY MOUTH ONCE DAILY. 90 tablet 0   timolol (TIMOPTIC) 0.5 % ophthalmic solution 1 drop 2 (two) times daily.     TRUEplus Lancets 33G MISC TEST BLOOD SUGAR THREE TIMES DAILY 300 each 0   No current facility-administered medications for this visit.    Allergies   Allergies as of 05/10/2022 - Review Complete 05/10/2022  Allergen Reaction Noted  Ace inhibitors Cough 11/04/2011   Latex Itching and Rash 05/04/2014    Past Medical History   Past Medical History:  Diagnosis Date   Arthritis    Asthma 1963   Chest pain with high risk for cardiac etiology 08/21/2017   Diabetes ([place])    Diabetes mellitus, type 2 ([place]) 2004   GERD (gastroesophageal reflux disease) 2004   Hypertension 2004   Low back pain    Obesity    RAD (reactive airway disease)    Sciatica    Seasonal allergies     Past Surgical History   Past Surgical History:  Procedure Laterality Date   ACROMIO-CLAVICULAR JOINT REPAIR Right  11/09/2012   Procedure: ACROMIO-CLAVICULAR JOINT REPAIR;  Surgeon: Carole Civil, MD;  Location: [facility];  Service: Orthopedics;  Laterality: Right;   COLONOSCOPY N/A 10/10/2016   Procedure: COLONOSCOPY;  Surgeon: Danie Binder, MD;  [facility];  Service: Endoscopy;  Laterality: N/A;  10:30   EYE SURGERY Right 05/09/2018   cataract   EYE SURGERY Left 2021   cataract   left breast biopsy for benign disease     SHOULDER ACROMIOPLASTY Right 11/09/2012   Procedure: RIGHT SHOULDER ACROMIOPLASTY;  Surgeon: Carole Civil, MD;  Location: [facility];  Service: Orthopedics;  Laterality: Right;   SHOULDER OPEN ROTATOR CUFF REPAIR Right 11/09/2012   Procedure: ROTATOR CUFF REPAIR Right SHOULDER OPEN;  Surgeon: Carole Civil, MD;  Location: [facility];  Service: Orthopedics;  Laterality: Right;   total knee arthroplasty left  02/01/2005   Dr. Aline Brochure   TUBAL LIGATION  1970    Past Family History   Family History  Problem Relation Age of Onset   Kidney failure Father    Kidney disease Father    Stroke Brother 71       used drugs , stroke and heart disease   Diabetes Brother    Hypertension Brother    Cancer Daughter 50       breast    Diabetes Mother    Asthma Other    Lung disease Other    Cancer Other    Heart disease Other    Arthritis Other    Colon cancer Neg Hx    Colon polyps Neg Hx     Past Social History   Social History   Socioeconomic History   Marital status: Married    Spouse name: Jeneen Rinks    Number of children: 7   Years of education: 8   Highest education level: GED or equivalent  Occupational History   Occupation: disabled   Tobacco Use   Smoking status: Former    Packs/day: 0.50    Years: 37.00    Total pack years: 18.50    Types: Cigarettes    Quit date: 10/12/1988    Years since quitting: 33.5   Smokeless tobacco: Never  Vaping Use   Vaping Use: Never used  Substance and Sexual Activity   Alcohol use: No   Drug use: No   Sexual  activity: Not Currently    Birth control/protection: Post-menopausal  Other Topics Concern   Not on file  Social History Narrative   Not on file   Social Determinants of Health   Financial Resource Strain: Low Risk  (01/07/2021)   Overall Financial Resource Strain (CARDIA)    Difficulty of Paying Living Expenses: Not hard at all  Food Insecurity: No Food Insecurity (01/07/2021)   Hunger Vital Sign    Worried About Running Out  of Food in the Last Year: Never true    [place] in the Last Year: Never true  Transportation Needs: No Transportation Needs (01/07/2021)   PRAPARE - Hydrologist (Medical): No    Lack of Transportation (Non-Medical): No  Physical Activity: Sufficiently Active (01/07/2021)   Exercise Vital Sign    Days of Exercise per Week: 7 days    Minutes of Exercise per Session: 60 min  Stress: No Stress Concern Present (01/06/2020)   [place]    Feeling of Stress : Not at all  Social Connections: Socially Isolated (01/07/2021)   Social Connection and Isolation Panel [NHANES]    Frequency of Communication with Friends and Family: More than three times a week    Frequency of Social Gatherings with Friends and Family: More than three times a week    Attends Religious Services: Never    Marine scientist or Organizations: No    Attends Archivist Meetings: Never    Marital Status: Widowed  Intimate Partner Violence: Not At Risk (01/07/2021)   Humiliation, Afraid, Rape, and Kick questionnaire    Fear of Current or Ex-Partner: No    Emotionally Abused: No    Physically Abused: No    Sexually Abused: No    Review of Systems   General: Negative for anorexia, unintentional weight loss, fever, chills, fatigue, weakness. Eyes: Negative for vision changes.  ENT: Negative for hoarseness, difficulty swallowing, nasal congestion. CV: Negative for chest pain,  angina, palpitations, dyspnea on exertion, peripheral edema.  Respiratory: Negative for dyspnea at rest, dyspnea on exertion, cough, sputum, wheezing.  GI: See history of present illness. GU:  Negative for dysuria, hematuria, urinary incontinence, urinary frequency, nocturnal urination.  MS: positive for shoulder pain.No low back pain.  Derm: Negative for rash or itching.  Neuro: Negative for weakness, abnormal sensation, seizure, frequent headaches, memory loss,  confusion.  Psych: Negative for anxiety, depression, suicidal ideation, hallucinations.  Endo: Negative for unusual weight change.  Heme: Negative for bruising or bleeding. Allergy: Negative for rash or hives.  Physical Exam   BP (!) 141/82 (BP Location: Right Arm, Patient Position: Sitting, Cuff Size: Normal)   Pulse 77   Temp 98.2 F (36.8 C) (Oral)   Ht 5' 2.5" (1.588 m)   Wt 180 lb (81.6 kg)   SpO2 95%   BMI 32.40 kg/m    General: Well-nourished, well-developed in no acute distress.  Head: Normocephalic, atraumatic.   Eyes: Conjunctiva pink, no icterus. Mouth: Oropharyngeal mucosa moist and pink  Neck: Supple without thyromegaly, masses, or lymphadenopathy.  Lungs: Clear to auscultation bilaterally.  Heart: Regular rate and rhythm, no murmurs rubs or gallops.  Abdomen: Bowel sounds are normal, nontender, nondistended, no hepatosplenomegaly or masses,  no abdominal bruits or hernia, no rebound or guarding.   Rectal: not performed Extremities: No lower extremity edema. No clubbing or deformities.  Neuro: Alert and oriented x 4 , grossly normal neurologically.  Skin: Warm and dry, no rash or jaundice.   Psych: Alert and cooperative, normal mood and affect.  Labs   Lab Results  Component Value Date   CREATININE 0.95 12/17/2021   BUN 9 12/17/2021   NA 136 12/17/2021   K 5.5 (H) 12/17/2021   CL 94 (L) 12/17/2021   CO2 28 12/17/2021   Lab Results  Component Value Date   ALT 19 12/17/2021   AST 24  12/17/2021  ALKPHOS 135 (H) 12/17/2021   BILITOT 0.4 12/17/2021   Lab Results  Component Value Date   WBC 7.2 03/22/2022   HGB 13.4 03/22/2022   HCT 40.2 03/22/2022   MCV 85 03/22/2022   PLT 316 03/22/2022    Imaging Studies   No results found.  Assessment   H/o colon polyps: overdue for surveillance colonoscopy.  The patient was found to have elevated blood pressure when vital signs were checked in the office. The blood pressure was rechecked by the nursing staff and it was found be persistently elevated >140/90 mmHg. I personally advised to the patient to follow up closely with his PCP for hypertension control.    PLAN   Colonoscopy to be scheduled once we received updated medication list. ASA 3.  I have discussed the risks, alternatives, benefits with regards to but not limited to the risk of reaction to medication, bleeding, infection, perforation and the patient is agreeable to proceed. Written consent to be obtained.    Laureen Ochs. Bobby Rumpf, [place], [facility]

## 2022-05-10 ENCOUNTER — Ambulatory Visit: Payer: Medicare Other | Admitting: Gastroenterology

## 2022-05-10 ENCOUNTER — Encounter: Payer: Self-pay | Admitting: Gastroenterology

## 2022-05-10 VITALS — BP 141/82 | HR 77 | Temp 98.2°F | Ht 62.5 in | Wt 180.0 lb

## 2022-05-10 DIAGNOSIS — Z86010 Personal history of colonic polyps: Secondary | ICD-10-CM | POA: Diagnosis not present

## 2022-05-10 NOTE — Patient Instructions (Addendum)
Please note, the following medication list may not be accurate. We are waiting on the list from the pharmacy. Once medications have been verified, we will call to schedule a colonoscopy.

## 2022-05-17 ENCOUNTER — Telehealth: Payer: Self-pay | Admitting: Gastroenterology

## 2022-05-17 NOTE — Telephone Encounter (Signed)
Tiffany Jacobs, can you please find out if patient is taking insulin? She has insulin needles on her med list but I don't see insulin on the list.

## 2022-05-17 NOTE — Telephone Encounter (Signed)
Seen recently in office. Medication list received and updated. Ok to schedule colonoscopy with Dr. Abbey Chatters for h/o colon polyps. ASA 3.   Hold iron 7 days. Day of prep: glipizide one tablet at BF. AM of TCS: hold glipizide

## 2022-05-17 NOTE — Telephone Encounter (Signed)
Pt states that is not currently on insulin.

## 2022-05-18 NOTE — Telephone Encounter (Signed)
Called pt, line rang numerous times, no answer and no VM

## 2022-05-19 NOTE — Telephone Encounter (Signed)
Letter also mailed to call.

## 2022-05-19 NOTE — Telephone Encounter (Signed)
Called pt, no answer , no VM 

## 2022-05-25 ENCOUNTER — Other Ambulatory Visit: Payer: Self-pay | Admitting: Family Medicine

## 2022-06-16 ENCOUNTER — Ambulatory Visit: Payer: Medicare Other | Admitting: Family Medicine

## 2022-06-23 ENCOUNTER — Other Ambulatory Visit: Payer: Self-pay | Admitting: Family Medicine

## 2022-06-30 DIAGNOSIS — H401112 Primary open-angle glaucoma, right eye, moderate stage: Secondary | ICD-10-CM | POA: Diagnosis not present

## 2022-07-01 ENCOUNTER — Ambulatory Visit: Payer: Medicare Other | Admitting: Family Medicine

## 2022-07-04 NOTE — Telephone Encounter (Signed)
Received VM from pt, call back, no answer and not able to leave VM

## 2022-07-12 ENCOUNTER — Ambulatory Visit (INDEPENDENT_AMBULATORY_CARE_PROVIDER_SITE_OTHER): Payer: Medicare Other | Admitting: Family Medicine

## 2022-07-12 ENCOUNTER — Encounter: Payer: Self-pay | Admitting: Family Medicine

## 2022-07-12 VITALS — BP 185/84 | HR 85 | Resp 16 | Ht 62.5 in | Wt 172.0 lb

## 2022-07-12 DIAGNOSIS — E1165 Type 2 diabetes mellitus with hyperglycemia: Secondary | ICD-10-CM

## 2022-07-12 DIAGNOSIS — D126 Benign neoplasm of colon, unspecified: Secondary | ICD-10-CM

## 2022-07-12 DIAGNOSIS — E782 Mixed hyperlipidemia: Secondary | ICD-10-CM | POA: Diagnosis not present

## 2022-07-12 DIAGNOSIS — I10 Essential (primary) hypertension: Secondary | ICD-10-CM

## 2022-07-12 DIAGNOSIS — Z794 Long term (current) use of insulin: Secondary | ICD-10-CM

## 2022-07-12 DIAGNOSIS — R300 Dysuria: Secondary | ICD-10-CM | POA: Diagnosis not present

## 2022-07-12 DIAGNOSIS — E1159 Type 2 diabetes mellitus with other circulatory complications: Secondary | ICD-10-CM | POA: Diagnosis not present

## 2022-07-12 DIAGNOSIS — R2681 Unsteadiness on feet: Secondary | ICD-10-CM

## 2022-07-12 LAB — POCT URINALYSIS DIP (CLINITEK)
Bilirubin, UA: NEGATIVE
Glucose, UA: >=1000 mg/dL — AB
Ketones, POC UA: NEGATIVE mg/dL
Leukocytes, UA: NEGATIVE
Nitrite, UA: NEGATIVE
Spec Grav, UA: 1.02 (ref 1.010–1.025)
Urobilinogen, UA: 0.2 E.U./dL
pH, UA: 7 (ref 5.0–8.0)

## 2022-07-12 LAB — GLUCOSE, POCT (MANUAL RESULT ENTRY): POC Glucose: 331 mg/dl — AB (ref 70–99)

## 2022-07-12 MED ORDER — AMLODIPINE-ATORVASTATIN 10-20 MG PO TABS: 1.0000 | ORAL_TABLET | Freq: Every day | ORAL | 3 refills | Status: DC

## 2022-07-12 MED ORDER — INSULIN ASPART 100 UNIT/ML IJ SOLN
7.0000 [IU] | Freq: Once | INTRAMUSCULAR | Status: AC
Start: 2022-07-12 — End: 2022-07-12
  Administered 2022-07-12: 7 [IU] via SUBCUTANEOUS

## 2022-07-12 MED ORDER — METOPROLOL TARTRATE 25 MG PO TABS: 25.0000 mg | ORAL_TABLET | Freq: Two times a day (BID) | ORAL | 3 refills | Status: DC

## 2022-07-12 MED ORDER — SPIRONOLACTONE 25 MG PO TABS: 25.0000 mg | ORAL_TABLET | Freq: Every day | ORAL | 3 refills | Status: DC

## 2022-07-12 NOTE — Patient Instructions (Addendum)
F/U in 10 days, review labs and blood pressure, BTING all medication to visit please, call if you need me before  Blood pressure is high you nEED to take medication consistently every day as prescribed  These are your blood pressure medications  spironolactone , Caduet and metoprolol.  When you get home please check that you do have the bottles and that you are taking them as you should.  If you do not have them please let us know I am actually going to refill all 3 today  Your blood sugar is extremely high at 331 which along with your blood pressure being too high is why you feel so weak  You will get 7 units of regular insulin in the office today  Nurse pls refer to [facility] for CCM  Labs in the office today HbA1c, CMP and EGFR, lipid panel, TSH, CBC, clean-catch UA with reflex culture and sensitivity if abnormal  You are referred to Cardiology in [place] to evaluate you for heart disease and help with blood pressure control, it is VERY important you keep appointment

## 2022-07-12 NOTE — Assessment & Plan Note (Addendum)
Uncontrolled, refuses Endo referral FBG in office is over 300, symptoatiuc hyperglycemi. HBA1C today CCM referral  Tiffany Jacobs is reminded of the importance of commitment to daily physical activity for 30 minutes or more, as able and the need to limit carbohydrate intake to 30 to 60 grams per meal to help with blood sugar control.  7 units insulin administered in office  The need to take medication as prescribed, test blood sugar as directed, and to call between visits if there is a concern that blood sugar is uncontrolled is also discussed.   Tiffany Jacobs is reminded of the importance of daily foot exam, annual eye examination, and good blood sugar, blood pressure and cholesterol control.     Latest Ref Rng & Units 12/17/2021    4:52 PM 12/17/2021    4:01 PM 08/05/2021    3:50 PM 04/15/2021    2:41 PM 04/14/2021    2:05 PM  Diabetic Labs  HbA1c 4.8 - 5.6 %  14.1  8.9  12    Micro/Creat Ratio 0 - 29 mg/g creat 37       Chol 100 - 199 mg/dL  161    CANCELED   HDL >39 mg/dL  50    CANCELED   Calc LDL 0 - 99 mg/dL  99      Triglycerides 0 - 149 mg/dL  096    CANCELED   Creatinine 0.57 - 1.00 mg/dL  0.45    CANCELED       07/12/2022    2:10 PM 05/10/2022    3:19 PM 05/10/2022    2:49 PM 04/07/2022   11:30 AM 04/07/2022   11:27 AM 03/22/2022    2:16 PM 02/22/2022    3:19 PM  BP/Weight  Systolic BP 185 141 146  159 128 120  Diastolic BP 84 82 84  80 76 82  Wt. (Lbs) 172  180 184  184.4   BMI 30.96 kg/m2  32.4 kg/m2 33.12 kg/m2  33.19 kg/m2       12/04/2018    2:20 PM 02/14/2018    2:20 PM  Foot/eye exam completion dates  Foot Form Completion Done Done      Daughter in with her and will assist as able

## 2022-07-12 NOTE — Assessment & Plan Note (Signed)
Elevated and uncontrolled, likely not taking meds as prescribed daughter will review meds on list with meds at home, all edications for diabetes are  sent in today also DASH diet and commitment to daily physical activity for a minimum of 30 minutes discussed and encouraged, as a part of hypertension management. The importance of attaining a healthy weight is also discussed.     07/12/2022    2:10 PM 05/10/2022    3:19 PM 05/10/2022    2:49 PM 04/07/2022   11:30 AM 04/07/2022   11:27 AM 03/22/2022    2:16 PM 02/22/2022    3:19 PM  BP/Weight  Systolic BP 185 141 146  159 128 120  Diastolic BP 84 82 84  80 76 82  Wt. (Lbs) 172  180 184  184.4   BMI 30.96 kg/m2  32.4 kg/m2 33.12 kg/m2  33.19 kg/m2

## 2022-07-12 NOTE — Assessment & Plan Note (Signed)
Rept colonoscopy recommended for 2021, will discuss at next visit

## 2022-07-12 NOTE — Assessment & Plan Note (Signed)
Hyperlipidemia:Low fat diet discussed and encouraged.   Lipid Panel  Lab Results  Component Value Date   CHOL 174 12/17/2021   HDL 50 12/17/2021   LDLCALC 99 12/17/2021   TRIG 140 12/17/2021   CHOLHDL 3.5 12/17/2021     Updated lab needed at/ before next visit.

## 2022-07-12 NOTE — Progress Notes (Signed)
Tiffany Jacobs     MRN: [id number]      DOB: [birth year]  Chief Complaint  Patient presents with   Diabetes    Follow up visit    Weakness    Very weak when she first wakes up in the am she can hardly walk. Then it gets better later in the day but still off balance     HPI Tiffany Jacobs is here for follow up and re-evaluation of chronic medical conditions, medication management and review of any available recent lab and radiology data.  Preventive health is updated, specifically  Cancer screening and Immunization.   Questions or concerns regarding consultations or procedures which the PT has had in the interim are  addressed. The PT denies any adverse reactions to current medications since the last visit.  There are no new concerns.  There are no specific complaints   ROS Denies recent fever or chills. Denies sinus pressure, nasal congestion, ear pain or sore throat. Denies chest congestion, productive cough or wheezing. Denies chest pains, palpitations and leg swelling Denies abdominal pain, nausea, vomiting,diarrhea or constipation.   Denies dysuria, frequency, hesitancy or incontinence. Denies joint pain, swelling and limitation in mobility. Denies headaches, seizures, numbness, or tingling. Denies depression, anxiety or insomnia. Denies skin break down or rash.   PE  BP (!) 185/84   Pulse 85   Resp 16   Ht 5' 2.5" (1.588 m)   Wt 172 lb (78 kg)   SpO2 92%   BMI 30.96 kg/m   Patient alert and oriented and in no cardiopulmonary distress.  HEENT: No facial asymmetry, EOMI,     Neck supple .  Chest: Clear to auscultation bilaterally.  CVS: S1, S2 no murmurs, no S3.Regular rate.  ABD: Soft non tender.   Ext: No edema  MS: Adequate ROM spine, shoulders, hips and knees.  Skin: Intact, no ulcerations or rash noted.  Psych: Good eye contact, normal affect. Memory intact not anxious or depressed appearing.  CNS: CN 2-12 intact, power,  normal throughout.no  focal deficits noted.   Assessment & Plan  Type 2 diabetes mellitus with vascular disease (HCC) Uncontrolled, refuses Endo referral FBG in office is over 300, symptoatiuc hyperglycemi. HBA1C today CCM referral  Tiffany Jacobs is reminded of the importance of commitment to daily physical activity for 30 minutes or more, as able and the need to limit carbohydrate intake to 30 to 60 grams per meal to help with blood sugar control.  7 units insulin administered in office  The need to take medication as prescribed, test blood sugar as directed, and to call between visits if there is a concern that blood sugar is uncontrolled is also discussed.   Tiffany Jacobs is reminded of the importance of daily foot exam, annual eye examination, and good blood sugar, blood pressure and cholesterol control.     Latest Ref Rng & Units 12/17/2021    4:52 PM 12/17/2021    4:01 PM 08/05/2021    3:50 PM 04/15/2021    2:41 PM 04/14/2021    2:05 PM  Diabetic Labs  HbA1c 4.8 - 5.6 %  14.1  8.9  12    Micro/Creat Ratio 0 - 29 mg/g creat 37       Chol 100 - 199 mg/dL  329    CANCELED   HDL >39 mg/dL  50    CANCELED   Calc LDL 0 - 99 mg/dL  99      Triglycerides 0 -  149 mg/dL  161    CANCELED   Creatinine 0.57 - 1.00 mg/dL  0.96    CANCELED       07/12/2022    2:10 PM 05/10/2022    3:19 PM 05/10/2022    2:49 PM 04/07/2022   11:30 AM 04/07/2022   11:27 AM 03/22/2022    2:16 PM 02/22/2022    3:19 PM  BP/Weight  Systolic BP 185 141 146  159 128 120  Diastolic BP 84 82 84  80 76 82  Wt. (Lbs) 172  180 184  184.4   BMI 30.96 kg/m2  32.4 kg/m2 33.12 kg/m2  33.19 kg/m2       12/04/2018    2:20 PM 02/14/2018    2:20 PM  Foot/eye exam completion dates  Foot Form Completion Done Done      Daughter in with her and will assist as able  Primary hypertension Elevated and uncontrolled, likely not taking meds as prescribed daughter will review meds on list with meds at home, all edications for diabetes are  sent in today  also DASH diet and commitment to daily physical activity for a minimum of 30 minutes discussed and encouraged, as a part of hypertension management. The importance of attaining a healthy weight is also discussed.     07/12/2022    2:10 PM 05/10/2022    3:19 PM 05/10/2022    2:49 PM 04/07/2022   11:30 AM 04/07/2022   11:27 AM 03/22/2022    2:16 PM 02/22/2022    3:19 PM  BP/Weight  Systolic BP 185 141 146  159 128 120  Diastolic BP 84 82 84  80 76 82  Wt. (Lbs) 172  180 184  184.4   BMI 30.96 kg/m2  32.4 kg/m2 33.12 kg/m2  33.19 kg/m2        Mixed hyperlipidemia Hyperlipidemia:Low fat diet discussed and encouraged.   Lipid Panel  Lab Results  Component Value Date   CHOL 174 12/17/2021   HDL 50 12/17/2021   LDLCALC 99 12/17/2021   TRIG 140 12/17/2021   CHOLHDL 3.5 12/17/2021     Updated lab needed at/ before next visit.   Unsteady gait when walking Chronic complaint, may need Ortho eval or simply PT/OT eval and management  Tubular adenoma of colon Rept colonoscopy recommended for 2021, will discuss at next visit

## 2022-07-12 NOTE — Assessment & Plan Note (Signed)
Chronic complaint, may need Ortho eval or simply PT/OT eval and management

## 2022-07-13 ENCOUNTER — Telehealth: Payer: Self-pay | Admitting: *Deleted

## 2022-07-13 LAB — CBC: RDW: 12.7 % (ref 11.7–15.4)

## 2022-07-13 LAB — CMP14+EGFR
Alkaline Phosphatase: 194 IU/L — ABNORMAL HIGH (ref 44–121)
Chloride: 94 mmol/L — ABNORMAL LOW (ref 96–106)
Potassium: 4.4 mmol/L (ref 3.5–5.2)
Sodium: 134 mmol/L (ref 134–144)

## 2022-07-13 LAB — LIPID PANEL
Cholesterol, Total: 313 mg/dL — ABNORMAL HIGH (ref 100–199)
HDL: 54 mg/dL (ref >39)

## 2022-07-13 MED ORDER — FENOFIBRATE 145 MG PO TABS
145.0000 mg | ORAL_TABLET | Freq: Every day | ORAL | 5 refills | Status: DC
Start: 2022-07-13 — End: 2022-09-07

## 2022-07-13 NOTE — Progress Notes (Signed)
  Care Coordination  Outreach Note  07/13/2022 Name: NISSA CAMPOPIANO MRN: [id number] DOB: 1945-11-01   Care Coordination Outreach Attempts: An unsuccessful telephone outreach was attempted today to offer the patient information about available care coordination services.  Follow Up Plan:  Additional outreach attempts will be made to offer the patient care coordination information and services.   Encounter Outcome:  No Answer  Christie Nottingham  Care Coordination Care Guide  Direct Dial: [phone]

## 2022-07-13 NOTE — Addendum Note (Signed)
Addended by: Kerri Perches on: 07/13/2022 07:31 AM   Modules accepted: Orders

## 2022-07-14 LAB — CBC
Hematocrit: 45.2 % (ref 34.0–46.6)
Hemoglobin: 14.6 g/dL (ref 11.1–15.9)
MCH: 27.7 pg (ref 26.6–33.0)
MCHC: 32.3 g/dL (ref 31.5–35.7)
MCV: 86 fL (ref 79–97)
Platelets: 300 10*3/uL (ref 150–450)
RBC: 5.27 x10E6/uL (ref 3.77–5.28)
WBC: 6 10*3/uL (ref 3.4–10.8)

## 2022-07-14 LAB — URINE CULTURE: Organism ID, Bacteria: NO GROWTH

## 2022-07-14 LAB — CMP14+EGFR
ALT: 23 IU/L (ref 0–32)
AST: 23 IU/L (ref 0–40)
Albumin/Globulin Ratio: 1.3 (ref 1.2–2.2)
Albumin: 4.3 g/dL (ref 3.8–4.8)
BUN/Creatinine Ratio: 9 — ABNORMAL LOW (ref 12–28)
BUN: 8 mg/dL (ref 8–27)
Bilirubin Total: 0.5 mg/dL (ref 0.0–1.2)
CO2: 27 mmol/L (ref 20–29)
Calcium: 10.2 mg/dL (ref 8.7–10.3)
Creatinine, Ser: 0.89 mg/dL (ref 0.57–1.00)
Globulin, Total: 3.3 g/dL (ref 1.5–4.5)
Glucose: 358 mg/dL — ABNORMAL HIGH (ref 70–99)
Total Protein: 7.6 g/dL (ref 6.0–8.5)
eGFR: 67 mL/min/{1.73_m2} (ref >59)

## 2022-07-14 LAB — HEMOGLOBIN A1C
Est. average glucose Bld gHb Est-mCnc: 369 mg/dL
Hgb A1c MFr Bld: 14.5 % — ABNORMAL HIGH (ref 4.8–5.6)

## 2022-07-14 LAB — LIPID PANEL
Chol/HDL Ratio: 5.8 ratio — ABNORMAL HIGH (ref 0.0–4.4)
LDL Chol Calc (NIH): 223 mg/dL — ABNORMAL HIGH (ref 0–99)
Triglycerides: 187 mg/dL — ABNORMAL HIGH (ref 0–149)
VLDL Cholesterol Cal: 36 mg/dL (ref 5–40)

## 2022-07-14 LAB — TSH: TSH: 2.03 u[IU]/mL (ref 0.450–4.500)

## 2022-07-14 NOTE — Progress Notes (Signed)
  Care Coordination  Outreach Note  07/14/2022 Name: Tiffany Jacobs MRN: [id number] DOB: [birth year]   Care Coordination Outreach Attempts: A second unsuccessful outreach was attempted today to offer the patient with information about available care coordination services.  Follow Up Plan:  Additional outreach attempts will be made to offer the patient care coordination information and services.   Encounter Outcome:  No Answer  Christie Nottingham  Care Coordination Care Guide  Direct Dial: [phone]

## 2022-07-15 NOTE — Progress Notes (Signed)
  Care Coordination   Note   07/15/2022 Name: Tiffany Jacobs MRN: [id number] DOB: 1946-02-01  Tiffany Jacobs is a 77 y.o. year old female who sees Kerri Perches, MD for primary care. I reached out to Dickey Gave by phone today to offer care coordination services.  Tiffany Jacobs was given information about Care Coordination services today including:   The Care Coordination services include support from the care team which includes your Nurse Coordinator, Clinical Social Worker, or Pharmacist.  The Care Coordination team is here to help remove barriers to the health concerns and goals most important to you. Care Coordination services are voluntary, and the patient may decline or stop services at any time by request to their care team member.   Care Coordination Consent Status: Patient agreed to services and verbal consent obtained.   Follow up plan:  Telephone appointment with care coordination team member scheduled for:  12/21/22  Encounter Outcome:  Pt. Scheduled  [facility] Coordination Care Guide  Direct Dial: [phone]

## 2022-07-21 ENCOUNTER — Ambulatory Visit: Payer: Self-pay | Admitting: *Deleted

## 2022-07-21 NOTE — Patient Outreach (Signed)
  Care Coordination   Initial Visit Note   08/04/2022 late entry for 07/21/22  Name: Tiffany Jacobs MRN: [id number] DOB: 1945-11-02  Tiffany Jacobs is a 77 y.o. year old female who sees Tiffany Perches, MD for primary care. I spoke with  Tiffany Jacobs by phone today.  What matters to the patients health and wellness today?  Diabetes diagnosed per patient in 2014 HgA1c 14.5 on 07/12/22, fatigue, weak,  fall, 200-300 cbgs, skin issues/sagging (arm face Legs), has neuropathy in her feet + shoulder to arm, had history of a cataract in the right eye Has been to a lot of dietitian Eating more fruits and vegetables, listen to a MD on tv,  reading food labels, Reading eat a lot of green vegetables  Was seeing Dr Tiffany Jacobs & Tiffany Jacobs stop seeing after an increase medicine causing stop taking insulin  Voiced understanding of benefit of increasing Protein, monitoring carbohydrate intake but not eliminating carbohydrates Weight reported to have been 234 , down to 171   Activity Afraid to walk outside as she is afraid of dogs  Voiced understanding of  in home chair exercises Denies dizziness with walking   Voiced the understanding of benefit of a Neurologist & endocrinology services     Goals Addressed             This Visit's Progress    [facility] RN care coordination services (diabetes, neuropathy, weight management)       Interventions Today    Flowsheet Row Most Recent Value  Chronic Disease   Chronic disease during today's visit Diabetes, Hypertension (HTN), Other  [neuropathy, weight management]  General Interventions   General Interventions Discussed/Reviewed General Interventions Discussed, Durable Medical Equipment (DME), Doctor Visits  Doctor Visits Discussed/Reviewed Doctor Visits Discussed, PCP, Specialist  [discussed neurology and endocrinology services]  Durable Medical Equipment (DME) Glucomoter  PCP/Specialist Visits Compliance with follow-up visit  Exercise  Interventions   Exercise Discussed/Reviewed Exercise Discussed, Physical Activity, Weight Managment  Physical Activity Discussed/Reviewed Physical Activity Discussed, Types of exercise  Weight Management Weight loss  Education Interventions   Education Provided Provided Education, Provided Web-based Education  Provided Verbal Education On Nutrition, Blood Sugar Monitoring, Medication, Community Resources  Mental Health Interventions   Mental Health Discussed/Reviewed Mental Health Discussed, Coping Strategies  Nutrition Interventions   Nutrition Discussed/Reviewed Nutrition Discussed, Decreasing sugar intake, Portion sizes, Decreasing fats, Fluid intake  Pharmacy Interventions   Pharmacy Dicussed/Reviewed Pharmacy Topics Discussed, Medications and their functions, Affording Medications, Medication Adherence  Medication Adherence Not taking medication  [Encouraged compliance with her medicine administration for diabetes]  Safety Interventions   Safety Discussed/Reviewed Safety Discussed, Fall Risk              SDOH assessments and interventions completed:  Yes     Care Coordination Interventions:  Yes, provided   Follow up plan: Follow up call scheduled for 08/20/22    Encounter Outcome:  Pt. Visit Completed    Tiffany Jacobs L. Tiffany Penner, RN, BSN, CCM [facility] Care Management Community Coordinator Office number [phone]

## 2022-07-25 ENCOUNTER — Other Ambulatory Visit: Payer: Self-pay | Admitting: Family Medicine

## 2022-07-28 ENCOUNTER — Ambulatory Visit (INDEPENDENT_AMBULATORY_CARE_PROVIDER_SITE_OTHER): Payer: Medicare Other | Admitting: Family Medicine

## 2022-07-28 ENCOUNTER — Encounter: Payer: Self-pay | Admitting: Family Medicine

## 2022-07-28 VITALS — BP 121/70 | HR 91 | Ht 62.0 in | Wt 168.1 lb

## 2022-07-28 DIAGNOSIS — Z794 Long term (current) use of insulin: Secondary | ICD-10-CM | POA: Diagnosis not present

## 2022-07-28 DIAGNOSIS — K219 Gastro-esophageal reflux disease without esophagitis: Secondary | ICD-10-CM | POA: Diagnosis not present

## 2022-07-28 DIAGNOSIS — E1159 Type 2 diabetes mellitus with other circulatory complications: Secondary | ICD-10-CM

## 2022-07-28 DIAGNOSIS — I10 Essential (primary) hypertension: Secondary | ICD-10-CM | POA: Diagnosis not present

## 2022-07-28 DIAGNOSIS — E782 Mixed hyperlipidemia: Secondary | ICD-10-CM | POA: Diagnosis not present

## 2022-07-28 MED ORDER — EMPAGLIFLOZIN 25 MG PO TABS: 25.0000 mg | ORAL_TABLET | Freq: Every day | ORAL | 3 refills | Status: AC

## 2022-07-28 MED ORDER — AMLODIPINE-ATORVASTATIN 10-40 MG PO TABS: 1.0000 | ORAL_TABLET | Freq: Every day | ORAL | 4 refills | Status: DC

## 2022-07-28 MED ORDER — METFORMIN HCL 500 MG PO TABS: 500.0000 mg | ORAL_TABLET | Freq: Two times a day (BID) | ORAL | 3 refills | Status: DC

## 2022-07-28 MED ORDER — LANTUS SOLOSTAR 100 UNIT/ML ~~LOC~~ SOPN: 10.0000 [IU] | PEN_INJECTOR | Freq: Every day | SUBCUTANEOUS | 99 refills | Status: DC

## 2022-07-28 NOTE — Patient Instructions (Signed)
Follow up in 4 to 5 weeks, please bring medications to visit.  Blood pressure is excellent, congratulations  Cholesterol is too high.  The dose of amlodipine Caduet is changed from 10/20 to  10/40 so that you will be taking more atorvastatin which helps to lower the cholesterol.  For your blood sugar as well as taking glipizide 10 mg 2 daily you have additional medications including insulin once daily.  You additional medicines for your blood sugar are metformin 500 mg 1 twice a day and Jardiance 25 mg once daily.  You are to start taking long-acting insulin 10 units 1 daily.  It is vital that you test and record your blood sugar at least 2 times daily for Singh in the morning before breakfast and last thing  at night.  Your morning blood sugar should range between 90-1 30  .  Your bedtime sugar should be between 1 40-1 80.  Please write your numbers down and bring them to your next visit.  Between visits if you have concerns get in touch with the clinic and we will respond.  You are referred and have an appointment with the heart specialist this is vital you keep that appointment.  Thanks for choosing [facility], we consider it a privelige to serve you.

## 2022-07-31 ENCOUNTER — Encounter: Payer: Self-pay | Admitting: Family Medicine

## 2022-07-31 NOTE — Assessment & Plan Note (Signed)
Controlled, no change in medication  

## 2022-07-31 NOTE — Assessment & Plan Note (Signed)
Uncontrolled med adjustment as note on d/I   To test at least twice daily and record Tiffany Jacobs is reminded of the importance of commitment to daily physical activity for 30 minutes or more, as able and the need to limit carbohydrate intake to 30 to 60 grams per meal to help with blood sugar control.   The need to take medication as prescribed, test blood sugar as directed, and to call between visits if there is a concern that blood sugar is uncontrolled is also discussed.   Tiffany Jacobs is reminded of the importance of daily foot exam, annual eye examination, and good blood sugar, blood pressure and cholesterol control.     Latest Ref Rng & Units 07/12/2022    3:42 PM 12/17/2021    4:52 PM 12/17/2021    4:01 PM 08/05/2021    3:50 PM 04/15/2021    2:41 PM  Diabetic Labs  HbA1c 4.8 - 5.6 % 14.5   14.1  8.9  12   Micro/Creat Ratio 0 - 29 mg/g creat  37      Chol 100 - 199 mg/dL 960   454     HDL >09 mg/dL 54   50     Calc LDL 0 - 99 mg/dL 811   99     Triglycerides 0 - 149 mg/dL 914   782     Creatinine 0.57 - 1.00 mg/dL 9.56   2.13         0/86/5784    3:13 PM 07/12/2022    2:10 PM 05/10/2022    3:19 PM 05/10/2022    2:49 PM 04/07/2022   11:30 AM 04/07/2022   11:27 AM 03/22/2022    2:16 PM  BP/Weight  Systolic BP 121 185 141 146  159 128  Diastolic BP 70 84 82 84  80 76  Wt. (Lbs) 168.12 172  180 184  184.4  BMI 30.75 kg/m2 30.96 kg/m2  32.4 kg/m2 33.12 kg/m2  33.19 kg/m2      12/04/2018    2:20 PM 02/14/2018    2:20 PM  Foot/eye exam completion dates  Foot Form Completion Done Done

## 2022-07-31 NOTE — Progress Notes (Signed)
NJERI ONORATO     MRN: [id number]      DOB: [birth year]  Chief Complaint  Patient presents with   Follow-up    Follow up bp and labs    HPI Ms. Semelsberger is here for follow up and re-evaluation of chronic medical conditions, medication management and review of any available recent lab and radiology data.  Preventive health is updated, specifically  Cancer screening and Immunization.   Reports blood sugar is still high over 200, accompanied by a daughter who states she works in health care and will help her Mother   ROS Denies recent fever or chills. Denies sinus pressure, nasal congestion, ear pain or sore throat. Denies chest congestion, productive cough or wheezing. Denies chest pains, palpitations and leg swelling Denies abdominal pain, nausea, vomiting,diarrhea or constipation.   Denies dysuria, frequency, hesitancy or incontinence. Chronic joint pain, swelling and limitation in mobility. Denies headaches, seizures, numbness, or tingling. chronic depression, anxiety or insomnia. Denies skin break down or rash.   PE  BP 121/70 (BP Location: Right Arm, Patient Position: Sitting, Cuff Size: Normal)   Pulse 91   Ht 5\' 2"  (1.575 m)   Wt 168 lb 1.9 oz (76.3 kg)   SpO2 93%   BMI 30.75 kg/m   Patient alert and oriented and in no cardiopulmonary distress.  HEENT: No facial asymmetry, EOMI,     Neck supple .  Chest: Clear to auscultation bilaterally.  CVS: S1, S2 no murmurs, no S3.Regular rate.  ABD: Soft non tender.   Ext: No edema  MS: Adequate ROM spine, shoulders, hips and knees.  Skin: Intact, no ulcerations or rash noted.  Psych: Good eye contact, normal affect. Memory intact not anxious or depressed appearing.  CNS: CN 2-12 intact, power,  normal throughout.no focal deficits noted.   Assessment & Plan  Mixed hyperlipidemia Hyperlipidemia:Low fat diet discussed and encouraged.   Lipid Panel  Lab Results  Component Value Date   CHOL 313 (H)  07/12/2022   HDL 54 07/12/2022   LDLCALC 223 (H) 07/12/2022   TRIG 187 (H) 07/12/2022   CHOLHDL 5.8 (H) 07/12/2022     Uncontrolled , increase caduet dose to inc atorvastatin to 40 mg daiy  Type 2 diabetes mellitus with vascular disease (HCC) Uncontrolled med adjustment as note on d/I   To test at least twice daily and record Ms. Bachman is reminded of the importance of commitment to daily physical activity for 30 minutes or more, as able and the need to limit carbohydrate intake to 30 to 60 grams per meal to help with blood sugar control.   The need to take medication as prescribed, test blood sugar as directed, and to call between visits if there is a concern that blood sugar is uncontrolled is also discussed.   Ms. Fenno is reminded of the importance of daily foot exam, annual eye examination, and good blood sugar, blood pressure and cholesterol control.     Latest Ref Rng & Units 07/12/2022    3:42 PM 12/17/2021    4:52 PM 12/17/2021    4:01 PM 08/05/2021    3:50 PM 04/15/2021    2:41 PM  Diabetic Labs  HbA1c 4.8 - 5.6 % 14.5   14.1  8.9  12   Micro/Creat Ratio 0 - 29 mg/g creat  37      Chol 100 - 199 mg/dL 409   811     HDL >91 mg/dL 54   50  Calc LDL 0 - 99 mg/dL 454   99     Triglycerides 0 - 149 mg/dL 098   119     Creatinine 0.57 - 1.00 mg/dL 1.47   8.29         5/62/1308    3:13 PM 07/12/2022    2:10 PM 05/10/2022    3:19 PM 05/10/2022    2:49 PM 04/07/2022   11:30 AM 04/07/2022   11:27 AM 03/22/2022    2:16 PM  BP/Weight  Systolic BP 121 185 141 146  159 128  Diastolic BP 70 84 82 84  80 76  Wt. (Lbs) 168.12 172  180 184  184.4  BMI 30.75 kg/m2 30.96 kg/m2  32.4 kg/m2 33.12 kg/m2  33.19 kg/m2      12/04/2018    2:20 PM 02/14/2018    2:20 PM  Foot/eye exam completion dates  Foot Form Completion Done Done        Gastroesophageal reflux disease Controlled, no change in medication   Primary hypertension Controlled, no change in medication DASH diet  and commitment to daily physical activity for a minimum of 30 minutes discussed and encouraged, as a part of hypertension management. The importance of attaining a healthy weight is also discussed.     07/28/2022    3:13 PM 07/12/2022    2:10 PM 05/10/2022    3:19 PM 05/10/2022    2:49 PM 04/07/2022   11:30 AM 04/07/2022   11:27 AM 03/22/2022    2:16 PM  BP/Weight  Systolic BP 121 185 141 146  159 128  Diastolic BP 70 84 82 84  80 76  Wt. (Lbs) 168.12 172  180 184  184.4  BMI 30.75 kg/m2 30.96 kg/m2  32.4 kg/m2 33.12 kg/m2  33.19 kg/m2

## 2022-07-31 NOTE — Assessment & Plan Note (Signed)
Controlled, no change in medication DASH diet and commitment to daily physical activity for a minimum of 30 minutes discussed and encouraged, as a part of hypertension management. The importance of attaining a healthy weight is also discussed.     07/28/2022    3:13 PM 07/12/2022    2:10 PM 05/10/2022    3:19 PM 05/10/2022    2:49 PM 04/07/2022   11:30 AM 04/07/2022   11:27 AM 03/22/2022    2:16 PM  BP/Weight  Systolic BP 121 185 141 146  159 128  Diastolic BP 70 84 82 84  80 76  Wt. (Lbs) 168.12 172  180 184  184.4  BMI 30.75 kg/m2 30.96 kg/m2  32.4 kg/m2 33.12 kg/m2  33.19 kg/m2

## 2022-07-31 NOTE — Assessment & Plan Note (Signed)
Hyperlipidemia:Low fat diet discussed and encouraged.   Lipid Panel  Lab Results  Component Value Date   CHOL 313 (H) 07/12/2022   HDL 54 07/12/2022   LDLCALC 223 (H) 07/12/2022   TRIG 187 (H) 07/12/2022   CHOLHDL 5.8 (H) 07/12/2022     Uncontrolled , increase caduet dose to inc atorvastatin to 40 mg daiy

## 2022-08-04 ENCOUNTER — Ambulatory Visit: Payer: Self-pay | Admitting: *Deleted

## 2022-08-04 ENCOUNTER — Encounter: Payer: Self-pay | Admitting: *Deleted

## 2022-08-04 NOTE — Patient Outreach (Signed)
  Care Coordination   08/04/2022 Name: Tiffany Jacobs MRN: [id number] DOB: 1946-01-10   Care Coordination Outreach Attempts:  An unsuccessful telephone outreach was attempted today to offer the patient information about available care coordination services.  Follow Up Plan:  Additional outreach attempts will be made to offer the patient care coordination information and services.   Encounter Outcome:  No Answer   Care Coordination Interventions:  No, not indicated    Tryson Lumley L. Noelle Penner, RN, BSN, CCM [facility] Care Management Community Coordinator Office number [phone]

## 2022-08-11 DIAGNOSIS — M79604 Pain in right leg: Secondary | ICD-10-CM | POA: Diagnosis not present

## 2022-08-11 DIAGNOSIS — R531 Weakness: Secondary | ICD-10-CM | POA: Diagnosis not present

## 2022-08-11 DIAGNOSIS — R059 Cough, unspecified: Secondary | ICD-10-CM | POA: Diagnosis not present

## 2022-08-11 DIAGNOSIS — M25551 Pain in right hip: Secondary | ICD-10-CM | POA: Diagnosis not present

## 2022-08-11 DIAGNOSIS — J069 Acute upper respiratory infection, unspecified: Secondary | ICD-10-CM | POA: Diagnosis not present

## 2022-08-11 DIAGNOSIS — N390 Urinary tract infection, site not specified: Secondary | ICD-10-CM | POA: Diagnosis not present

## 2022-08-11 DIAGNOSIS — M19012 Primary osteoarthritis, left shoulder: Secondary | ICD-10-CM | POA: Diagnosis not present

## 2022-08-24 ENCOUNTER — Other Ambulatory Visit: Payer: Self-pay | Admitting: Family Medicine

## 2022-08-30 ENCOUNTER — Ambulatory Visit: Payer: Self-pay | Admitting: *Deleted

## 2022-08-30 NOTE — Patient Outreach (Signed)
Care Coordination   Follow Up Visit Note   10/13/2022 Name: Tiffany Jacobs MRN: [id number] DOB: [birth year]  Tiffany Jacobs is a 77 y.o. year old female who sees Tiffany Perches, MD for primary care. I spoke with  Tiffany Jacobs by phone today.  What matters to the patients health and wellness today?  Doing better after hospital Computer not working at this time    Hx breast abnormalities -recommend breast checks hx of negative results not get at certain   upper respiratory infection (URI)  managing urinary tract infection (UTI) concerns reviewed  Diabetes (DM) type 2 - generally have been 300-400's this morning cbg= 275   Chair exercise she will attempt to view you tube & other online sites v  Food labels will be reviewed to attempt to increase intake of foods with sugar less than 10   She will attempt to work on Portion sizes, carbohydrates, knowing when she is full   Voiced understanding of monitoring foods that contain Fructose, glucose, and sucrose   Home preparation of foods Glory kale greens, rinse out salt Egg white without yolk, monitor coffee, increase water intake    Reviewed thyroid issues symptoms  Voiced understanding of Metabolism changes & reasons for constipation  Agave can help prevent and reduce the effects of heart disease and diabetes, and keep your blood glucose under control Voiced understanding that Gatorade zero  Are okay  She voiced understanding of 5 ways to boost metabolism, exercise with weights not skipping meals, eating fat burning foods, and getting a good night sleep  He voiced understanding of the following symptoms of thyroid abnormalities increased calorie burning, difficulty gaining weight, increased breathing, insomnia and frequent sweating.   Goals Addressed             This Visit's Progress    [facility] RN care coordination services (diabetes, neuropathy, weight management)       Interventions Today    Flowsheet  Row Most Recent Value  Chronic Disease   Chronic disease during today's visit Other, Diabetes  [breast abnormalities, URI, UTIs, exercise, weight management]  General Interventions   General Interventions Discussed/Reviewed General Interventions Reviewed, Doctor Visits, Community Resources  Doctor Visits Discussed/Reviewed Doctor Visits Reviewed, PCP, Specialist  PCP/Specialist Visits Compliance with follow-up visit  Exercise Interventions   Exercise Discussed/Reviewed Exercise Reviewed, Weight Managment, Physical Activity  Physical Activity Discussed/Reviewed Physical Activity Reviewed, Types of exercise, Home Exercise Program (HEP)  [walking, chair exercises]  Weight Management Weight loss  Education Interventions   Education Provided Provided Education  [breast abnormalities monitoring, URI, UTIs, home exercises, reading food labels, portion sizes, prepartation of foods at home, thyroid management]  Provided Verbal Education On Nutrition, Blood Sugar Monitoring, Exercise, Medication, Community Resources, Other  Mental Health Interventions   Mental Health Discussed/Reviewed Mental Health Reviewed, Coping Strategies  Nutrition Interventions   Nutrition Discussed/Reviewed Nutrition Reviewed, Carbohydrate meal planning, Fluid intake, Portion sizes, Decreasing sugar intake, Increasing proteins, Decreasing fats, Decreasing salt  Pharmacy Interventions   Pharmacy Dicussed/Reviewed Pharmacy Topics Reviewed, Affording Medications               SDOH assessments and interventions completed:  No     Care Coordination Interventions:  Yes, provided   Follow up plan: Follow up call scheduled for 09/29/22    Cala Bradford L. Noelle Penner, RN, BSN, CCM [facility] Care Management Community Coordinator Office number [phone]    Encounter Outcome:  Pt. Visit Completed   Maximillion Gill L. Noelle Penner, RN, BSN,  CCM [facility] Care Management Community Coordinator Office number [phone]

## 2022-09-07 ENCOUNTER — Encounter: Payer: Self-pay | Admitting: Internal Medicine

## 2022-09-07 ENCOUNTER — Ambulatory Visit: Payer: Medicare Other | Attending: Internal Medicine | Admitting: Internal Medicine

## 2022-09-07 VITALS — BP 116/64 | HR 101 | Ht 62.5 in | Wt 171.0 lb

## 2022-09-07 DIAGNOSIS — E781 Pure hyperglyceridemia: Secondary | ICD-10-CM

## 2022-09-07 DIAGNOSIS — I10 Essential (primary) hypertension: Secondary | ICD-10-CM | POA: Diagnosis not present

## 2022-09-07 NOTE — Patient Instructions (Addendum)
Medication Instructions:  Your physician has recommended you make the following change in your medication:  Stop taking Fenofibrate Continue all other medications as prescribed  Labwork: Lipids Non-fasting in 3 months  Testing/Procedures: None  Follow-Up: Your physician recommends that you schedule a follow-up appointment in: 4 months  Any Other Special Instructions Will Be Listed Below (If Applicable).  If you need a refill on your cardiac medications before your next appointment, please call your pharmacy.

## 2022-09-07 NOTE — Progress Notes (Signed)
Cardiology Office Note  Date: 09/07/2022   ID: [name], [name] 1945/06/02, MRN [id number]  PCP:  Kerri Perches, MD  Cardiologist:  Prentice Docker, MD (Inactive) Electrophysiologist:  None   Reason for Office Visit: Management of hyperlipidemia   History of Present Illness: Tiffany Jacobs is a 77 y.o. female known to have HTN, DM 2, HLD was referred to cardiology clinic for evaluation of HLD.  Lipid panel from 6/24 reviewed, total cholesterol 313 (elevated), TG 197 (mildly elevated), LDL 223 (significantly elevated).  She is already on atorvastatin 40 mg nightly and her PCP added fenofibrate 140 mg once daily for mildly elevated TG.  Patient eats healthy and does not exercise.  No symptoms of angina, DOE.  No orthopnea, PND or leg swelling.  No syncope.  Past Medical History:  Diagnosis Date   Arthritis    Asthma 1963   Cataract    Chest pain with high risk for cardiac etiology 08/21/2017   Diabetes (HCC)    Diabetes mellitus, type 2 (HCC) 2004   GERD (gastroesophageal reflux disease) 2004   Hypertension 2004   Low back pain    Obesity    RAD (reactive airway disease)    Sciatica    Seasonal allergies     Past Surgical History:  Procedure Laterality Date   ACROMIO-CLAVICULAR JOINT REPAIR Right 11/09/2012   Procedure: ACROMIO-CLAVICULAR JOINT REPAIR;  Surgeon: Vickki Hearing, MD;  Location: [facility];  Service: Orthopedics;  Laterality: Right;   COLONOSCOPY N/A 10/10/2016   Procedure: COLONOSCOPY;  Surgeon: West Bali, MD;  Location: [facility] END[facility] SUITE;  Service: Endoscopy;  Laterality: N/A;  10:30   EYE SURGERY Right 05/09/2018   cataract   EYE SURGERY Left 2021   cataract   left breast biopsy for benign disease     SHOULDER ACROMIOPLASTY Right 11/09/2012   Procedure: RIGHT SHOULDER ACROMIOPLASTY;  Surgeon: Vickki Hearing, MD;  Location: [facility];  Service: Orthopedics;  Laterality: Right;   SHOULDER OPEN ROTATOR CUFF REPAIR Right 11/09/2012    Procedure: ROTATOR CUFF REPAIR Right SHOULDER OPEN;  Surgeon: Vickki Hearing, MD;  Location: [facility];  Service: Orthopedics;  Laterality: Right;   total knee arthroplasty left  02/01/2005   Dr. Romeo Apple   TUBAL LIGATION  1970    Current Outpatient Medications  Medication Sig Dispense Refill   albuterol (PROVENTIL) (2.5 MG/3ML) 0.083% nebulizer solution Take 3 mLs (2.5 mg total) by nebulization every 6 (six) hours as needed for wheezing or shortness of breath. 75 mL 5   Alcohol Swabs (B-D SINGLE USE SWABS REGULAR) PADS Use to test as directed 100 each 6   amLODipine-atorvastatin (CADUET) 10-40 MG tablet Take 1 tablet by mouth daily. 30 tablet 4   blood glucose meter kit and supplies Two times daily testing (dispense meter based on insurance preference) dx e11.65 1 each 0   empagliflozin (JARDIANCE) 25 MG TABS tablet Take 1 tablet (25 mg total) by mouth daily. 30 tablet 3   famotidine (PEPCID) 20 MG tablet One after supper 30 tablet 11   fenofibrate (TRICOR) 145 MG tablet Take 1 tablet (145 mg total) by mouth daily. 30 tablet 5   ferrous sulfate 325 (65 FE) MG tablet Take 325 mg by mouth daily with breakfast.     Fluocinolone Acetonide 0.01 % OIL Instill 5 drops twice daily into both ear(s) for 7 days. Then stop and use as needed for itching.     gabapentin (NEURONTIN) 300 MG capsule Take  1 capsule (300 mg total) by mouth at bedtime. 90 capsule 1   glipiZIDE (GLUCOTROL XL) 10 MG 24 hr tablet TAKE 2 TABLETS BY MOUTH WITH BREAKFAST. 60 tablet 0   glucose blood test strip Use as instructed once daily dx E11.9 50 each 5   Insulin Syringe-Needle U-100 (BD SAFETYGLIDE INSULIN SYRINGE) 31G X 15/64" 0.3 ML MISC Use to self administer insulin twice daily. 100 each 3   latanoprost (XALATAN) 0.005 % ophthalmic solution SMARTSIG:1 Drop(s) In Eye(s) Every Evening     metFORMIN (GLUCOPHAGE) 500 MG tablet Take 1 tablet (500 mg total) by mouth 2 (two) times daily with a meal. 60 tablet 3   metoprolol  tartrate (LOPRESSOR) 25 MG tablet Take 1 tablet (25 mg total) by mouth 2 (two) times daily. 60 tablet 3   nitroGLYCERIN (NITROSTAT) 0.4 MG SL tablet DISSOLVE ONE TABLET UNDER THE TONGUE EVERY 5 MINUTES AS NEEDED FOR CHEST PAIN. DO NOT EXCEED A TOTAL OF 3 DOSES IN 15 MINUTES  0   pantoprazole (PROTONIX) 20 MG tablet Take 1 tablet (20 mg total) by mouth daily. 90 tablet 1   spironolactone (ALDACTONE) 25 MG tablet Take 1 tablet (25 mg total) by mouth daily. 30 tablet 3   timolol (TIMOPTIC) 0.5 % ophthalmic solution 1 drop 2 (two) times daily.     TRUEplus Lancets 33G MISC TEST BLOOD SUGAR THREE TIMES DAILY 300 each 0   insulin glargine (LANTUS SOLOSTAR) 100 UNIT/ML Solostar Pen Inject 10 Units into the skin at bedtime. (Patient not taking: Reported on 09/07/2022) 15 mL PRN   No current facility-administered medications for this visit.   Allergies:  Ace inhibitors and Latex   Social History: The patient  reports that she quit smoking about 33 years ago. Her smoking use included cigarettes. She has a 18.50 pack-year smoking history. She has never used smokeless tobacco. She reports that she does not drink alcohol and does not use drugs.   Family History: The patient's family history includes Arthritis in an other family member; Asthma in an other family member; Cancer in an other family member; Cancer (age of onset: 65) in her daughter; Diabetes in her brother and mother; Heart disease in an other family member; Hypertension in her brother; Kidney disease in her father; Kidney failure in her father; Lung disease in an other family member; Stroke (age of onset: 42) in her brother.   ROS:  Please see the history of present illness. Otherwise, complete review of systems is positive for none.  All other systems are reviewed and negative.   Physical Exam: VS:  BP 116/64   Pulse (!) 101   Ht 5' 2.5" (1.588 m)   Wt 171 lb (77.6 kg)   SpO2 97%   BMI 30.78 kg/m , BMI Body mass index is 30.78 kg/m.  Wt  Readings from Last 3 Encounters:  09/07/22 171 lb (77.6 kg)  07/28/22 168 lb 1.9 oz (76.3 kg)  07/21/22 171 lb (77.6 kg)    General: Patient appears comfortable at rest. HEENT: Conjunctiva and lids normal, oropharynx clear with moist mucosa. Neck: Supple, no elevated JVP or carotid bruits, no thyromegaly. Lungs: Clear to auscultation, nonlabored breathing at rest. Cardiac: Regular rate and rhythm, no S3 or significant systolic murmur, no pericardial rub. Abdomen: Soft, nontender, no hepatomegaly, bowel sounds present, no guarding or rebound. Extremities: No pitting edema, distal pulses 2+. Skin: Warm and dry. Musculoskeletal: No kyphosis. Neuropsychiatric: Alert and oriented x3, affect grossly appropriate.  Recent Labwork: 07/12/2022: ALT  23; AST 23; BUN 8; Creatinine, Ser 0.89; Hemoglobin 14.6; Platelets 300; Potassium 4.4; Sodium 134; TSH 2.030     Component Value Date/Time   CHOL 313 (H) 07/12/2022 1542   TRIG 187 (H) 07/12/2022 1542   HDL 54 07/12/2022 1542   CHOLHDL 5.8 (H) 07/12/2022 1542   CHOLHDL 3.0 05/21/2019 0929   VLDL 20 07/22/2016 1004   LDLCALC 223 (H) 07/12/2022 1542   LDLCALC 107 (H) 05/21/2019 0929     Assessment and Plan:  # Hyperlipidemia, not at goal # Hypertriglyceridemia, not at goal -Lipid panel from 6/24 reviewed, total cholesterol 313 (elevated), TG 197 (mildly elevated), LDL 223 (significantly elevated).  She is already on atorvastatin 40 mg nightly and her PCP added fenofibrate 140 mg once daily for mildly elevated TG.  Will stop fenofibrate and add bempedoic acid-Zetia 180 mg - 10 mg once daily.  If insurance does not approve bempedoic acid/Zetia combination, will start Leqvio.  Patient does not prefer to inject herself and hence not a candidate for PCSK9 inhibitors.  Patient can be sent to cancer center, infusion center for liquid injections.  Obtain lipid panel in 3 months.  If her TG continues to be elevated, she will benefit from addition of Vascepa  or Lovaza at that time (due to CV benefit compared to fenofibrate).  # HTN, controlled -Continue amlodipine 10 mg once daily, metoprolol tartrate 25 mg twice daily, spironolactone 25 mg once daily.  HTN management per PCP.  I have spent a total of 45 minutes with patient reviewing chart, EKGs, labs and examining patient as well as establishing an assessment and plan that was discussed with the patient.  > 50% of time was spent in direct patient care.    Medication Adjustments/Labs and Tests Ordered: Current medicines are reviewed at length with the patient today.  Concerns regarding medicines are outlined above.   Tests Ordered: Orders Placed This Encounter  Procedures   EKG 12-Lead    Medication Changes: No orders of the defined types were placed in this encounter.   Disposition:  Follow up  4 months  Signed, Emmerson Taddei Verne Spurr, MD, 09/07/2022 11:41 AM    [facility] [address], [place], [place] [address]

## 2022-09-15 ENCOUNTER — Encounter: Payer: Self-pay | Admitting: Family Medicine

## 2022-09-15 ENCOUNTER — Ambulatory Visit (INDEPENDENT_AMBULATORY_CARE_PROVIDER_SITE_OTHER): Payer: Medicare Other | Admitting: Family Medicine

## 2022-09-15 VITALS — BP 120/80 | HR 81 | Ht 62.0 in | Wt 173.0 lb

## 2022-09-15 DIAGNOSIS — E1159 Type 2 diabetes mellitus with other circulatory complications: Secondary | ICD-10-CM | POA: Diagnosis not present

## 2022-09-15 DIAGNOSIS — Z1231 Encounter for screening mammogram for malignant neoplasm of breast: Secondary | ICD-10-CM

## 2022-09-15 DIAGNOSIS — K219 Gastro-esophageal reflux disease without esophagitis: Secondary | ICD-10-CM

## 2022-09-15 DIAGNOSIS — E782 Mixed hyperlipidemia: Secondary | ICD-10-CM | POA: Diagnosis not present

## 2022-09-15 DIAGNOSIS — I10 Essential (primary) hypertension: Secondary | ICD-10-CM | POA: Diagnosis not present

## 2022-09-15 DIAGNOSIS — J45991 Cough variant asthma: Secondary | ICD-10-CM

## 2022-09-15 DIAGNOSIS — E669 Obesity, unspecified: Secondary | ICD-10-CM

## 2022-09-15 MED ORDER — METFORMIN HCL 1000 MG PO TABS: 1000.0000 mg | ORAL_TABLET | Freq: Two times a day (BID) | ORAL | 3 refills | Status: DC

## 2022-09-15 NOTE — Patient Instructions (Addendum)
   F/U week of August 12, call if you need me sooner  Non fasting hBA1C, chem 7 and eGFR  August 7, 8 or 9, 2024  New higher dose of metformin is 1000 mg one tablet two times daily  Goal for fasting blood sugar is 100 to 150, and bedtime blood sugar 130 to 180  Pls schedule mammogram at checkout  Dr Clelia Croft , ophthalmologist in [place] please send for diabetic eye exam  Blood pressure is excellent  Check at pharmacy for new medication prescribed by Cardiologist Dr Lenord Carbo sent on 09/07/2022, if not there you need to contact her office  Thanks for choosing [facility], we consider it a privelige to serve you.   Marland Kitchen

## 2022-09-19 ENCOUNTER — Encounter: Payer: Self-pay | Admitting: Family Medicine

## 2022-09-19 NOTE — Assessment & Plan Note (Signed)
Stable , managed by Pulmonary

## 2022-09-19 NOTE — Assessment & Plan Note (Signed)
Controlled, no change in medication DASH diet and commitment to daily physical activity for a minimum of 30 minutes discussed and encouraged, as a part of hypertension management. The importance of attaining a healthy weight is also discussed.     09/15/2022    2:19 PM 09/15/2022    1:57 PM 09/15/2022    1:52 PM 09/07/2022   11:30 AM 07/28/2022    3:13 PM 07/21/2022    2:31 PM 07/12/2022    2:10 PM  BP/Weight  Systolic BP 120 146 160 116 121  185  Diastolic BP 80 87 77 64 70  84  Wt. (Lbs)   173 171 168.12 171 172  BMI   31.64 kg/m2 30.78 kg/m2 30.75 kg/m2 30.78 kg/m2 30.96 kg/m2

## 2022-09-19 NOTE — Assessment & Plan Note (Signed)
Hyperlipidemia:Low fat diet discussed and encouraged.   Lipid Panel  Lab Results  Component Value Date   CHOL 313 (H) 07/12/2022   HDL 54 07/12/2022   LDLCALC 223 (H) 07/12/2022   TRIG 187 (H) 07/12/2022   CHOLHDL 5.8 (H) 07/12/2022     Uncontrolled , management by Cardiology, pt to follow up

## 2022-09-19 NOTE — Assessment & Plan Note (Signed)
  Patient re-educated about  the importance of commitment to a  minimum of 150 minutes of exercise per week as able.  The importance of healthy food choices with portion control discussed, as well as eating regularly and within a 12 hour window most days. The need to choose "clean , green" food 50 to 75% of the time is discussed, as well as to make water the primary drink and set a goal of 64 ounces water daily.       09/15/2022    1:52 PM 09/07/2022   11:30 AM 07/28/2022    3:13 PM  Weight /BMI  Weight 173 lb 171 lb 168 lb 1.9 oz  Height 5\' 2"  (1.575 m) 5' 2.5" (1.588 m) 5\' 2"  (1.575 m)  BMI 31.64 kg/m2 30.78 kg/m2 30.75 kg/m2

## 2022-09-19 NOTE — Progress Notes (Signed)
Tiffany Jacobs     MRN: [id number]      DOB: 04/29/1945  Chief Complaint  Patient presents with   Follow-up    5 wk follow up    HPI Tiffany Jacobs is here for follow up and re-evaluation of chronic medical conditions, medication management and review of any available recent lab and radiology data.  Preventive health is updated, specifically  Cancer screening and Immunization.   Blood sugar is being checked and recorded twice daily and she has a log, which reflects some improvement. Needs insulin but still refusing Has seen Cardiology and attempt is in place to change lipid lowering medication, pt needs to  f/u on this still has not filled script Blood sugar is often over 200 still  ROS Denies recent fever or chills. Denies sinus pressure, nasal congestion, ear pain or sore throat. Denies chest congestion, productive cough or wheezing. Denies chest pains, palpitations and leg swelling Denies abdominal pain, nausea, vomiting,diarrhea or constipation.   Denies dysuria, frequency, hesitancy or incontinence. Chronic  joint pain, and limitation in mobility. Denies headaches, seizures, numbness, or tingling. Denies depression, anxiety or insomnia. Denies skin break down or rash.   PE  BP 120/80   Pulse 81   Ht 5\' 2"  (1.575 m)   Wt 173 lb (78.5 kg)   SpO2 95%   BMI 31.64 kg/m   Patient alert and oriented and in no cardiopulmonary distress.  HEENT: No facial asymmetry, EOMI,     Neck supple .  Chest: Clear to auscultation bilaterally.  CVS: S1, S2 no murmurs, no S3.Regular rate.  ABD: Soft non tender.   Ext: No edema  MS: Adequate ROM spine, shoulders, hips and knees.  Skin: Intact, no ulcerations or rash noted.  Psych: Good eye contact, normal affect. Memory intact not anxious or depressed appearing.  CNS: CN 2-12 intact, power,  normal throughout.no focal deficits noted.   Assessment & Plan  Primary hypertension Controlled, no change in medication DASH  diet and commitment to daily physical activity for a minimum of 30 minutes discussed and encouraged, as a part of hypertension management. The importance of attaining a healthy weight is also discussed.     09/15/2022    2:19 PM 09/15/2022    1:57 PM 09/15/2022    1:52 PM 09/07/2022   11:30 AM 07/28/2022    3:13 PM 07/21/2022    2:31 PM 07/12/2022    2:10 PM  BP/Weight  Systolic BP 120 146 160 116 121  185  Diastolic BP 80 87 77 64 70  84  Wt. (Lbs)   173 171 168.12 171 172  BMI   31.64 kg/m2 30.78 kg/m2 30.75 kg/m2 30.78 kg/m2 30.96 kg/m2       Type 2 diabetes mellitus with vascular disease (HCC) Uncontrolled , though somewhat improved based on log Increase metformin to 1000mg  twice daily Tiffany Jacobs is reminded of the importance of commitment to daily physical activity for 30 minutes or more, as able and the need to limit carbohydrate intake to 30 to 60 grams per meal to help with blood sugar control.   The need to take medication as prescribed, test blood sugar as directed, and to call between visits if there is a concern that blood sugar is uncontrolled is also discussed.   Tiffany Jacobs is reminded of the importance of daily foot exam, annual eye examination, and good blood sugar, blood pressure and cholesterol control.     Latest Ref Rng & Units 07/12/2022  3:42 PM 12/17/2021    4:52 PM 12/17/2021    4:01 PM 08/05/2021    3:50 PM 04/15/2021    2:41 PM  Diabetic Labs  HbA1c 4.8 - 5.6 % 14.5   14.1  8.9  12   Micro/Creat Ratio 0 - 29 mg/g creat  37      Chol 100 - 199 mg/dL 409   811     HDL >91 mg/dL 54   50     Calc LDL 0 - 99 mg/dL 478   99     Triglycerides 0 - 149 mg/dL 295   621     Creatinine 0.57 - 1.00 mg/dL 3.08   6.57         8/46/9629    2:19 PM 09/15/2022    1:57 PM 09/15/2022    1:52 PM 09/07/2022   11:30 AM 07/28/2022    3:13 PM 07/21/2022    2:31 PM 07/12/2022    2:10 PM  BP/Weight  Systolic BP 120 146 160 116 121  185  Diastolic BP 80 87 77 64 70  84  Wt.  (Lbs)   173 171 168.12 171 172  BMI   31.64 kg/m2 30.78 kg/m2 30.75 kg/m2 30.78 kg/m2 30.96 kg/m2      12/04/2018    2:20 PM 02/14/2018    2:20 PM  Foot/eye exam completion dates  Foot Form Completion Done Done      Updated lab needed at/ before next visit.   Mixed hyperlipidemia Hyperlipidemia:Low fat diet discussed and encouraged.   Lipid Panel  Lab Results  Component Value Date   CHOL 313 (H) 07/12/2022   HDL 54 07/12/2022   LDLCALC 223 (H) 07/12/2022   TRIG 187 (H) 07/12/2022   CHOLHDL 5.8 (H) 07/12/2022     Uncontrolled , management by Cardiology, pt to follow up  Obesity (BMI 30.0-34.9)  Patient re-educated about  the importance of commitment to a  minimum of 150 minutes of exercise per week as able.  The importance of healthy food choices with portion control discussed, as well as eating regularly and within a 12 hour window most days. The need to choose "clean , green" food 50 to 75% of the time is discussed, as well as to make water the primary drink and set a goal of 64 ounces water daily.       09/15/2022    1:52 PM 09/07/2022   11:30 AM 07/28/2022    3:13 PM  Weight /BMI  Weight 173 lb 171 lb 168 lb 1.9 oz  Height 5\' 2"  (1.575 m) 5' 2.5" (1.588 m) 5\' 2"  (1.575 m)  BMI 31.64 kg/m2 30.78 kg/m2 30.75 kg/m2      Gastroesophageal reflux disease Controlled, no change in medication   Cough variant asthma Stable , managed by Pulmonary

## 2022-09-19 NOTE — Assessment & Plan Note (Signed)
Uncontrolled , though somewhat improved based on log Increase metformin to 1000mg  twice daily Tiffany Jacobs is reminded of the importance of commitment to daily physical activity for 30 minutes or more, as able and the need to limit carbohydrate intake to 30 to 60 grams per meal to help with blood sugar control.   The need to take medication as prescribed, test blood sugar as directed, and to call between visits if there is a concern that blood sugar is uncontrolled is also discussed.   Tiffany Jacobs is reminded of the importance of daily foot exam, annual eye examination, and good blood sugar, blood pressure and cholesterol control.     Latest Ref Rng & Units 07/12/2022    3:42 PM 12/17/2021    4:52 PM 12/17/2021    4:01 PM 08/05/2021    3:50 PM 04/15/2021    2:41 PM  Diabetic Labs  HbA1c 4.8 - 5.6 % 14.5   14.1  8.9  12   Micro/Creat Ratio 0 - 29 mg/g creat  37      Chol 100 - 199 mg/dL 409   811     HDL >91 mg/dL 54   50     Calc LDL 0 - 99 mg/dL 478   99     Triglycerides 0 - 149 mg/dL 295   621     Creatinine 0.57 - 1.00 mg/dL 3.08   6.57         8/46/9629    2:19 PM 09/15/2022    1:57 PM 09/15/2022    1:52 PM 09/07/2022   11:30 AM 07/28/2022    3:13 PM 07/21/2022    2:31 PM 07/12/2022    2:10 PM  BP/Weight  Systolic BP 120 146 160 116 121  185  Diastolic BP 80 87 77 64 70  84  Wt. (Lbs)   173 171 168.12 171 172  BMI   31.64 kg/m2 30.78 kg/m2 30.75 kg/m2 30.78 kg/m2 30.96 kg/m2      12/04/2018    2:20 PM 02/14/2018    2:20 PM  Foot/eye exam completion dates  Foot Form Completion Done Done      Updated lab needed at/ before next visit.

## 2022-09-19 NOTE — Assessment & Plan Note (Signed)
Controlled, no change in medication  

## 2022-09-20 ENCOUNTER — Telehealth: Payer: Self-pay | Admitting: Pharmacist

## 2022-09-20 NOTE — Telephone Encounter (Signed)
Received a message from CMA from Dr. Jenene Slicker about starting Nexlizet or Leqvio.  Nexlizet is nonformulary on patient's insurance.  Leqvio would be a 20% co-pay.  Which would mean patient would have to pay over $600 for each injection unless she was approved for patient assistance.  It also appears as though patient is not taking her statin.  Last LDL-C was 223 but 9 months ago it was 99.  It appears she is not taking her atorvastatin.  I attempted to call patient and schedule her in the lipid clinic to discuss.  Patient did not answer and I was unable to leave voicemail.  I will try again later.

## 2022-09-21 ENCOUNTER — Other Ambulatory Visit: Payer: Self-pay | Admitting: Family Medicine

## 2022-09-23 ENCOUNTER — Encounter (HOSPITAL_COMMUNITY): Payer: Self-pay

## 2022-09-23 ENCOUNTER — Ambulatory Visit (HOSPITAL_COMMUNITY)
Admission: RE | Admit: 2022-09-23 | Discharge: 2022-09-23 | Disposition: A | Discharge status: Home / Self Care | Payer: Medicare Other | Source: Ambulatory Visit | Attending: Family Medicine | Admitting: Family Medicine

## 2022-09-23 DIAGNOSIS — Z1231 Encounter for screening mammogram for malignant neoplasm of breast: Secondary | ICD-10-CM | POA: Diagnosis not present

## 2022-09-29 ENCOUNTER — Ambulatory Visit: Payer: Self-pay | Admitting: *Deleted

## 2022-09-29 NOTE — Patient Outreach (Signed)
  Care Coordination   09/29/2022 Name: Tiffany Jacobs MRN: [id number] DOB: 06/21/1945   Care Coordination Outreach Attempts:  An unsuccessful telephone outreach was attempted for a scheduled appointment today.  Follow Up Plan:  Additional outreach attempts will be made to offer the patient care coordination information and services.   Encounter Outcome:  No Answer   Care Coordination Interventions:  No, not indicated    Raynald Rouillard L. Noelle Penner, RN, BSN, CCM [facility] Care Management Community Coordinator Office number [phone]

## 2022-10-04 NOTE — Telephone Encounter (Signed)
I have tried to call patient x 3. Each time it rings and rings and then asks me to enter my remote access code.

## 2022-10-11 ENCOUNTER — Telehealth: Payer: Self-pay | Admitting: *Deleted

## 2022-10-11 NOTE — Progress Notes (Unsigned)
  Care Coordination Note  10/11/2022 Name: [name] MRN: [id number] DOB: [birth year]  Tiffany Jacobs is a 77 y.o. year old female who is a primary care patient of Kerri Perches, MD and is actively engaged with the care management team. I reached out to Dickey Gave by phone today to assist with re-scheduling a follow up visit with the RN Case Manager  Follow up plan: Unsuccessful telephone outreach attempt made.    [facility]  Care Coordination Care Guide  Direct Dial: [phone]

## 2022-10-12 NOTE — Progress Notes (Signed)
  Care Coordination Note  10/12/2022 Name: Tiffany Jacobs MRN: [id number] DOB: 1945-10-15  [name] is a 77 y.o. year old female who is a primary care patient of Kerri Perches, MD and is actively engaged with the care management team. I reached out to Dickey Gave by phone today to assist with re-scheduling a follow up visit with the RN Case Manager  Follow up plan: Unsuccessful telephone outreach attempt made. A HIPAA compliant phone message was left for the patient providing contact information and requesting a return call.  We have been unable to make contact with the patient for follow up. The care management team is available to follow up with the patient after provider conversation with the patient regarding recommendation for care management engagement and subsequent re-referral to the care management team.  [facility] Coordination Care Guide  Direct Dial: [phone]

## 2022-10-13 ENCOUNTER — Ambulatory Visit: Payer: Self-pay | Admitting: *Deleted

## 2022-10-13 NOTE — Patient Instructions (Addendum)
Visit Information  Thank you for taking time to visit with me today. Please don't hesitate to contact me if I can be of assistance to you.   Following are the goals we discussed today:   Goals Addressed             This Visit's Progress    [facility] RN care coordination services (diabetes, neuropathy, weight management)       Interventions Today    Flowsheet Row Most Recent Value  Chronic Disease   Chronic disease during today's visit Other, Diabetes  [breast abnormalities, URI, UTIs, exercise, weight management]  General Interventions   General Interventions Discussed/Reviewed General Interventions Reviewed, Doctor Visits, Community Resources  Doctor Visits Discussed/Reviewed Doctor Visits Reviewed, PCP, Specialist  PCP/Specialist Visits Compliance with follow-up visit  Exercise Interventions   Exercise Discussed/Reviewed Exercise Reviewed, Weight Managment, Physical Activity  Physical Activity Discussed/Reviewed Physical Activity Reviewed, Types of exercise, Home Exercise Program (HEP)  [walking, chair exercises]  Weight Management Weight loss  Education Interventions   Education Provided Provided Education  [breast abnormalities monitoring, URI, UTIs, home exercises, reading food labels, portion sizes, prepartation of foods at home, thyroid management]  Provided Verbal Education On Nutrition, Blood Sugar Monitoring, Exercise, Medication, Community Resources, Other  Mental Health Interventions   Mental Health Discussed/Reviewed Mental Health Reviewed, Coping Strategies  Nutrition Interventions   Nutrition Discussed/Reviewed Nutrition Reviewed, Carbohydrate meal planning, Fluid intake, Portion sizes, Decreasing sugar intake, Increasing proteins, Decreasing fats, Decreasing salt  Pharmacy Interventions   Pharmacy Dicussed/Reviewed Pharmacy Topics Reviewed, Affording Medications               Our next appointment is by telephone on 09/29/22 at 1:30 pm  Please call the care  guide team at [phone] if you need to cancel or reschedule your appointment.   If you are experiencing a Mental Health or Behavioral Health Crisis or need someone to talk to, please call the Suicide and Crisis Lifeline: 988 call the [place] National Suicide Prevention Lifeline: 514-317-5936 or TTY: 541-528-5985 TTY (405)543-9590) to talk to a trained counselor call 1-800-273-TALK (toll free, 24 hour hotline) call the [facility]: [phone] call 911   Patient verbalizes understanding of instructions and care plan provided today and agrees to view in MyChart. Active MyChart status and patient understanding of how to access instructions and care plan via MyChart confirmed with patient.     The patient has been provided with contact information for the care management team and has been advised to call with any health related questions or concerns.    Jaydy Fitzhenry L. Noelle Penner, RN, BSN, CCM [facility] Care Management Community Coordinator Office number [phone]

## 2022-10-13 NOTE — Patient Instructions (Signed)
Visit Information  Thank you for taking time to visit with me today. Please don't hesitate to contact me if I can be of assistance to you.   Following are the goals we discussed today:   Goals Addressed             This Visit's Progress    [facility] RN care coordination services (diabetes, neuropathy, weight management)       Interventions Today    Flowsheet Row Most Recent Value  Chronic Disease   Chronic disease during today's visit Diabetes, Hypertension (HTN), Other  [neuropathy, weight management]  General Interventions   General Interventions Discussed/Reviewed General Interventions Discussed, Durable Medical Equipment (DME), Doctor Visits  Doctor Visits Discussed/Reviewed Doctor Visits Discussed, PCP, Specialist  [discussed neurology and endocrinology services]  Durable Medical Equipment (DME) Glucomoter  PCP/Specialist Visits Compliance with follow-up visit  Exercise Interventions   Exercise Discussed/Reviewed Exercise Discussed, Physical Activity, Weight Managment  Physical Activity Discussed/Reviewed Physical Activity Discussed, Types of exercise  Weight Management Weight loss  Education Interventions   Education Provided Provided Education, Provided Web-based Education  Provided Verbal Education On Nutrition, Blood Sugar Monitoring, Medication, Community Resources  Mental Health Interventions   Mental Health Discussed/Reviewed Mental Health Discussed, Coping Strategies  Nutrition Interventions   Nutrition Discussed/Reviewed Nutrition Discussed, Decreasing sugar intake, Portion sizes, Decreasing fats, Fluid intake  Pharmacy Interventions   Pharmacy Dicussed/Reviewed Pharmacy Topics Discussed, Medications and their functions, Affording Medications, Medication Adherence  Medication Adherence Not taking medication  [Encouraged compliance with her medicine administration for diabetes]  Safety Interventions   Safety Discussed/Reviewed Safety Discussed, Fall Risk               Our next appointment is by telephone on 08/30/22 at 1 pm  Please call the care guide team at [phone] if you need to cancel or reschedule your appointment.   If you are experiencing a Mental Health or Behavioral Health Crisis or need someone to talk to, please call the Suicide and Crisis Lifeline: 988 call the [place] National Suicide Prevention Lifeline: 4507313746 or TTY: 507-029-7368 TTY (714)681-6170) to talk to a trained counselor call 1-800-273-TALK (toll free, 24 hour hotline) call the [facility]: 2527474335 call 911   Patient verbalizes understanding of instructions and care plan provided today and agrees to view in MyChart. Active MyChart status and patient understanding of how to access instructions and care plan via MyChart confirmed with patient.     The patient has been provided with contact information for the care management team and has been advised to call with any health related questions or concerns.   Tiffany Jacobs L. Noelle Penner, RN, BSN, CCM [facility] Care Management Community Coordinator Office number [phone]

## 2022-10-18 ENCOUNTER — Ambulatory Visit: Payer: Medicare Other | Admitting: Family Medicine

## 2022-10-19 ENCOUNTER — Encounter: Payer: Self-pay | Admitting: Family Medicine

## 2022-10-20 ENCOUNTER — Ambulatory Visit (INDEPENDENT_AMBULATORY_CARE_PROVIDER_SITE_OTHER): Payer: Medicare Other | Admitting: Family Medicine

## 2022-10-20 ENCOUNTER — Encounter: Payer: Self-pay | Admitting: Family Medicine

## 2022-10-20 VITALS — BP 142/78 | HR 87 | Ht 62.0 in | Wt 169.0 lb

## 2022-10-20 DIAGNOSIS — M791 Myalgia, unspecified site: Secondary | ICD-10-CM

## 2022-10-20 DIAGNOSIS — M25512 Pain in left shoulder: Secondary | ICD-10-CM | POA: Diagnosis not present

## 2022-10-20 DIAGNOSIS — M25551 Pain in right hip: Secondary | ICD-10-CM

## 2022-10-20 DIAGNOSIS — G8929 Other chronic pain: Secondary | ICD-10-CM | POA: Insufficient documentation

## 2022-10-20 MED ORDER — LIDOCAINE 5 % EX PTCH: 1.0000 | MEDICATED_PATCH | CUTANEOUS | 0 refills | Status: AC

## 2022-10-20 MED ORDER — METHYLPREDNISOLONE SODIUM SUCC 40 MG IJ SOLR
40.0000 mg | Freq: Once | INTRAMUSCULAR | Status: DC
Start: 2022-10-20 — End: 2022-10-20

## 2022-10-20 MED ORDER — TIZANIDINE HCL 4 MG PO CAPS
4.0000 mg | ORAL_CAPSULE | Freq: Three times a day (TID) | ORAL | 1 refills | Status: AC | PRN
Start: 2022-10-20 — End: ?

## 2022-10-20 MED ORDER — METHYLPREDNISOLONE SODIUM SUCC 40 MG IJ SOLR
125.0000 mg | Freq: Once | INTRAMUSCULAR | Status: AC
Start: 2022-10-20 — End: 2022-10-20
  Administered 2022-10-20: 125 mg via INTRAMUSCULAR

## 2022-10-20 NOTE — Patient Instructions (Signed)

## 2022-10-20 NOTE — Progress Notes (Signed)
Patient Office Visit   Subjective   Patient ID: Tiffany Jacobs, female    DOB: Jul 18, 1945  Age: 77 y.o. MRN: [id number]  CC:  Chief Complaint  Patient presents with   Pain    Patient complains of R hip pain radiating down her leg, and L shoulder pain radiating down her arm.     HPI Tiffany Jacobs 77 year old female, presents to the clinic for right hip pain and left shoulder pain. She  has a past medical history of Arthritis, Asthma (1963), Cataract, Chest pain with high risk for cardiac etiology (08/21/2017), Diabetes (HCC), Diabetes mellitus, type 2 (HCC) (2004), GERD (gastroesophageal reflux disease) (2004), Hypertension (2004), Low back pain, Obesity, RAD (reactive airway disease), Sciatica, and Seasonal allergies.  Chronic Right Hip Pain  There was no injury mechanism. The pain is present in the right hip. The quality of the pain is described as aching, burning, cramping and shooting. The pain is at a severity of 9/10. The pain has been Constant since onset. Associated symptoms include numbness and tingling. She reports no foreign bodies present. The symptoms are aggravated by movement and weight bearing. She has tried acetaminophen and NSAIDs for the symptoms. The treatment provided no relief.  Chronic Left Shoulder Pain  The pain is present in the left shoulder. This is a chronic problem. There has been a history of trauma. The problem occurs constantly. The problem has been gradually worsening. The quality of the pain is described as sharp and shooting. The pain is at a severity of 7/10. Associated symptoms include a limited range of motion, numbness, stiffness and tingling. The symptoms are aggravated by activity. She has tried NSAIDS and acetaminophen for the symptoms. The treatment provided no relief. Family history does not include rheumatoid arthritis. Her past medical history is significant for diabetes.        Outpatient Encounter Medications as of 10/20/2022   Medication Sig   albuterol (PROVENTIL) (2.5 MG/3ML) 0.083% nebulizer solution Take 3 mLs (2.5 mg total) by nebulization every 6 (six) hours as needed for wheezing or shortness of breath.   Alcohol Swabs (B-D SINGLE USE SWABS REGULAR) PADS Use to test as directed   amLODipine-atorvastatin (CADUET) 10-40 MG tablet Take 1 tablet by mouth daily.   blood glucose meter kit and supplies Two times daily testing (dispense meter based on insurance preference) dx e11.65   empagliflozin (JARDIANCE) 25 MG TABS tablet Take 1 tablet (25 mg total) by mouth daily.   famotidine (PEPCID) 20 MG tablet One after supper   fenofibrate (TRICOR) 145 MG tablet Take 145 mg by mouth daily.   ferrous sulfate 325 (65 FE) MG tablet Take 325 mg by mouth daily with breakfast.   Fluocinolone Acetonide 0.01 % OIL Instill 5 drops twice daily into both ear(s) for 7 days. Then stop and use as needed for itching.   gabapentin (NEURONTIN) 300 MG capsule Take 1 capsule (300 mg total) by mouth at bedtime.   glipiZIDE (GLUCOTROL XL) 10 MG 24 hr tablet TAKE 2 TABLETS BY MOUTH WITH BREAKFAST.   glucose blood test strip Use as instructed once daily dx E11.9   Insulin Syringe-Needle U-100 (BD SAFETYGLIDE INSULIN SYRINGE) 31G X 15/64" 0.3 ML MISC Use to self administer insulin twice daily.   latanoprost (XALATAN) 0.005 % ophthalmic solution SMARTSIG:1 Drop(s) In Eye(s) Every Evening   lidocaine (LIDODERM) 5 % Place 1 patch onto the skin daily. Remove & Discard patch within 12 hours or as directed by MD  metFORMIN (GLUCOPHAGE) 1000 MG tablet Take 1 tablet (1,000 mg total) by mouth 2 (two) times daily with a meal.   metoprolol tartrate (LOPRESSOR) 25 MG tablet Take 1 tablet (25 mg total) by mouth 2 (two) times daily.   nitroGLYCERIN (NITROSTAT) 0.4 MG SL tablet DISSOLVE ONE TABLET UNDER THE TONGUE EVERY 5 MINUTES AS NEEDED FOR CHEST PAIN. DO NOT EXCEED A TOTAL OF 3 DOSES IN 15 MINUTES   pantoprazole (PROTONIX) 20 MG tablet Take 1 tablet (20 mg  total) by mouth daily.   spironolactone (ALDACTONE) 25 MG tablet Take 1 tablet (25 mg total) by mouth daily.   timolol (TIMOPTIC) 0.5 % ophthalmic solution 1 drop 2 (two) times daily.   tiZANidine (ZANAFLEX) 4 MG capsule Take 1 capsule (4 mg total) by mouth 3 (three) times daily as needed for muscle spasms.   traMADol (ULTRAM) 50 MG tablet Take 50 mg by mouth every 8 (eight) hours as needed.   TRUEplus Lancets 33G MISC TEST BLOOD SUGAR THREE TIMES DAILY   [EXPIRED] methylPREDNISolone sodium succinate (SOLU-MEDROL) 40 mg/mL injection 125 mg    [DISCONTINUED] methylPREDNISolone sodium succinate (SOLU-MEDROL) 40 mg/mL injection 40 mg    No facility-administered encounter medications on file as of 10/20/2022.    Past Surgical History:  Procedure Laterality Date   ACROMIO-CLAVICULAR JOINT REPAIR Right 11/09/2012   Procedure: ACROMIO-CLAVICULAR JOINT REPAIR;  Surgeon: Vickki Hearing, MD;  Location: [facility];  Service: Orthopedics;  Laterality: Right;   COLONOSCOPY N/A 10/10/2016   Procedure: COLONOSCOPY;  Surgeon: West Bali, MD;  Location: [facility];  Service: Endoscopy;  Laterality: N/A;  10:30   EYE SURGERY Right 05/09/2018   cataract   EYE SURGERY Left 2021   cataract   left breast biopsy for benign disease     SHOULDER ACROMIOPLASTY Right 11/09/2012   Procedure: RIGHT SHOULDER ACROMIOPLASTY;  Surgeon: Vickki Hearing, MD;  Location: [facility];  Service: Orthopedics;  Laterality: Right;   SHOULDER OPEN ROTATOR CUFF REPAIR Right 11/09/2012   Procedure: ROTATOR CUFF REPAIR Right SHOULDER OPEN;  Surgeon: Vickki Hearing, MD;  Location: [facility];  Service: Orthopedics;  Laterality: Right;   total knee arthroplasty left  02/01/2005   Dr. Romeo Apple   TUBAL LIGATION  1970    Review of Systems  Constitutional:  Negative for chills and fever.  Eyes:  Negative for blurred vision.  Respiratory:  Negative for shortness of breath.   Cardiovascular:  Negative for chest pain.   Musculoskeletal:  Positive for joint pain and myalgias. Negative for falls.  Skin:  Negative for rash.  Neurological:  Negative for dizziness and headaches.      Objective    BP (!) 142/78   Pulse 87   Ht 5\' 2"  (1.575 m)   Wt 169 lb (76.7 kg)   SpO2 94%   BMI 30.91 kg/m   Physical Exam Vitals reviewed.  Constitutional:      General: She is not in acute distress.    Appearance: Normal appearance. She is not ill-appearing, toxic-appearing or diaphoretic.  HENT:     Head: Normocephalic.  Eyes:     General:        Right eye: No discharge.        Left eye: No discharge.     Conjunctiva/sclera: Conjunctivae normal.  Cardiovascular:     Rate and Rhythm: Normal rate.     Pulses: Normal pulses.     Heart sounds: Normal heart sounds.  Pulmonary:     Effort:  Pulmonary effort is normal. No respiratory distress.     Breath sounds: Normal breath sounds.  Musculoskeletal:        General: Tenderness present.     Right shoulder: No tenderness. Normal range of motion. Decreased strength.     Left shoulder: Tenderness present. Decreased range of motion. Decreased strength.     Cervical back: Normal range of motion.     Right hip: Tenderness present. Decreased range of motion. Decreased strength.     Left hip: No tenderness. Decreased range of motion. Decreased strength.  Skin:    General: Skin is warm and dry.     Capillary Refill: Capillary refill takes less than 2 seconds.  Neurological:     General: No focal deficit present.     Mental Status: She is alert and oriented to person, place, and time.     Gait: Gait abnormal.  Psychiatric:        Mood and Affect: Mood normal.        Behavior: Behavior normal.       Assessment & Plan:  Chronic left shoulder pain -     Rheumatoid factor  Muscle pain -     methylPREDNISolone Sodium Succ  Right hip pain -     Rheumatoid factor  Other chronic pain Assessment & Plan: Related to musculoskeletal pain Lidocaine 5% patch q 24  hours Zanaflex 4 mg PRN Solu-Medrol injection given today Explained to patient Non pharmacological interventions include the use of ice or heat, rest, recommend range of motion exercises, gentle stretching. The use of NSAIDs for pain management.  Follow up for worsening or persistent symptoms. Patient  verbalizes understanding regarding plan of care and all questions answered.  Patient requesting labs to Rule out for RA   Other orders -     Lidocaine; Place 1 patch onto the skin daily. Remove & Discard patch within 12 hours or as directed by MD  Dispense: 30 patch; Refill: 0 -     tiZANidine HCl; Take 1 capsule (4 mg total) by mouth 3 (three) times daily as needed for muscle spasms.  Dispense: 30 capsule; Refill: 1    Return if symptoms worsen or fail to improve.   Cruzita Lederer Newman Nip, FNP

## 2022-10-20 NOTE — Assessment & Plan Note (Signed)
Related to musculoskeletal pain Lidocaine 5% patch q 24 hours Zanaflex 4 mg PRN Solu-Medrol injection given today Explained to patient Non pharmacological interventions include the use of ice or heat, rest, recommend range of motion exercises, gentle stretching. The use of NSAIDs for pain management.  Follow up for worsening or persistent symptoms. Patient  verbalizes understanding regarding plan of care and all questions answered.  Patient requesting labs to Rule out for RA

## 2022-10-21 LAB — RHEUMATOID FACTOR: Rheumatoid fact SerPl-aCnc: 10.4 [IU]/mL (ref <14.0)

## 2022-10-24 ENCOUNTER — Other Ambulatory Visit: Payer: Self-pay | Admitting: Family Medicine

## 2022-10-24 ENCOUNTER — Telehealth: Payer: Self-pay | Admitting: Family Medicine

## 2022-10-24 NOTE — Telephone Encounter (Signed)
Patient called in regard to lidocaine (LIDODERM) 5 %    Med needs pre [place] for insurance coverage.  [employer] wants a call back in regard.

## 2022-10-25 NOTE — Telephone Encounter (Signed)
Pa started on cover my meds 

## 2022-10-28 NOTE — Telephone Encounter (Signed)
Patient called she received the pills would not give her the patches, patient understood the prior authorization.

## 2022-11-24 ENCOUNTER — Other Ambulatory Visit: Payer: Self-pay | Admitting: Family Medicine

## 2022-12-05 ENCOUNTER — Other Ambulatory Visit (INDEPENDENT_AMBULATORY_CARE_PROVIDER_SITE_OTHER): Payer: Medicare Other

## 2022-12-05 ENCOUNTER — Ambulatory Visit: Payer: Medicare Other | Admitting: Orthopedic Surgery

## 2022-12-05 VITALS — BP 118/72 | HR 68 | Ht 62.0 in | Wt 167.0 lb

## 2022-12-05 DIAGNOSIS — M51369 Other intervertebral disc degeneration, lumbar region without mention of lumbar back pain or lower extremity pain: Secondary | ICD-10-CM

## 2022-12-05 DIAGNOSIS — M1611 Unilateral primary osteoarthritis, right hip: Secondary | ICD-10-CM

## 2022-12-05 DIAGNOSIS — G8929 Other chronic pain: Secondary | ICD-10-CM

## 2022-12-05 DIAGNOSIS — M47896 Other spondylosis, lumbar region: Secondary | ICD-10-CM

## 2022-12-05 DIAGNOSIS — M5136 Other intervertebral disc degeneration, lumbar region: Secondary | ICD-10-CM | POA: Diagnosis not present

## 2022-12-05 DIAGNOSIS — M4316 Spondylolisthesis, lumbar region: Secondary | ICD-10-CM

## 2022-12-05 DIAGNOSIS — M25551 Pain in right hip: Secondary | ICD-10-CM

## 2022-12-05 MED ORDER — GABAPENTIN 100 MG PO CAPS
100.0000 mg | ORAL_CAPSULE | Freq: Three times a day (TID) | ORAL | 2 refills | Status: AC
Start: 2022-12-05 — End: ?

## 2022-12-05 MED ORDER — INDOMETHACIN 25 MG PO CAPS
25.0000 mg | ORAL_CAPSULE | Freq: Three times a day (TID) | ORAL | 0 refills | Status: AC
Start: 2022-12-05 — End: 2022-12-19

## 2022-12-05 NOTE — Patient Instructions (Addendum)
Stop tizanidine   Stop taking 300 mg gabapentin at night    Start  Gabapentin 100 mg 3 x a day   Indocin 25 mg 3 x a day for 2 weeks   Tylenol 500 mg 3 x a day   Physical therapy has been ordered for you at [facility]. They should call you to schedule, [phone] is the phone number to call, if you want to call to schedule.

## 2022-12-05 NOTE — Progress Notes (Signed)
   VISIT TYPE: New problem Chief Complaint  Patient presents with   Back Pain    Back pain with sciatica runs down to her toes on left side.    Encounter Diagnoses  Name Primary?   Chronic left-sided low back pain with left-sided sciatica Yes   Arthritis of right hip    Spondylolisthesis, lumbar region    DDD (degenerative disc disease), lumbar     Assessment and Plan: She has lower back pain with some radicular symptoms.  She also has arthritis in the right hip by x-ray  Currently symptoms do not warrant surgery recommend physical therapy along with some medication changes including Tylenol  Stopping the tizanidine  Changing the gabapentin to 100 mg 3 times a day for pain relief  Meds ordered this encounter  Medications   gabapentin (NEURONTIN) 100 MG capsule    Sig: Take 1 capsule (100 mg total) by mouth 3 (three) times daily.    Dispense:  90 capsule    Refill:  2   indomethacin (INDOCIN) 25 MG capsule    Sig: Take 1 capsule (25 mg total) by mouth 3 (three) times daily with meals for 14 days.    Dispense:  42 capsule    Refill:  0     HPI: 77 year old female presents for evaluation of lower back pain with radicular symptoms from the lower back into the left foot x > 1 year associated with some pain in that area  She is also having pain in her right groin  She took tizanidine and that made her feel funny.  She also tried Tylenol heat ibuprofen pain is still quite severe   BP 118/72   Pulse 68   Ht 5\' 2"  (1.575 m)   Wt 167 lb (75.8 kg)   BMI 30.54 kg/m   Ortho Exam  Lower back tenderness decreased range of motion globally reflexes 0-1+ at the knee and ankle no strength deficits in the lower extremities straight leg raise is negative  Right hip examination normal rotation internal/external flexion extension with no discomfort   Imaging internal imaging today included the lumbar spine and the right hip  A/P Encounter Diagnoses  Name Primary?   Chronic  left-sided low back pain with left-sided sciatica Yes   Arthritis of right hip    Spondylolisthesis, lumbar region    DDD (degenerative disc disease), lumbar     Meds ordered this encounter  Medications   gabapentin (NEURONTIN) 100 MG capsule    Sig: Take 1 capsule (100 mg total) by mouth 3 (three) times daily.    Dispense:  90 capsule    Refill:  2   indomethacin (INDOCIN) 25 MG capsule    Sig: Take 1 capsule (25 mg total) by mouth 3 (three) times daily with meals for 14 days.    Dispense:  42 capsule    Refill:  0

## 2022-12-07 ENCOUNTER — Ambulatory Visit (HOSPITAL_COMMUNITY): Payer: Medicare Other | Attending: Orthopedic Surgery

## 2022-12-07 ENCOUNTER — Other Ambulatory Visit: Payer: Self-pay

## 2022-12-07 ENCOUNTER — Telehealth: Payer: Self-pay | Admitting: Radiology

## 2022-12-07 DIAGNOSIS — M1611 Unilateral primary osteoarthritis, right hip: Secondary | ICD-10-CM | POA: Diagnosis not present

## 2022-12-07 DIAGNOSIS — M25561 Pain in right knee: Secondary | ICD-10-CM | POA: Insufficient documentation

## 2022-12-07 DIAGNOSIS — G8929 Other chronic pain: Secondary | ICD-10-CM | POA: Diagnosis not present

## 2022-12-07 DIAGNOSIS — M5442 Lumbago with sciatica, left side: Secondary | ICD-10-CM | POA: Diagnosis not present

## 2022-12-07 DIAGNOSIS — M6281 Muscle weakness (generalized): Secondary | ICD-10-CM | POA: Diagnosis not present

## 2022-12-07 DIAGNOSIS — M5459 Other low back pain: Secondary | ICD-10-CM | POA: Insufficient documentation

## 2022-12-07 DIAGNOSIS — M51369 Other intervertebral disc degeneration, lumbar region without mention of lumbar back pain or lower extremity pain: Secondary | ICD-10-CM | POA: Insufficient documentation

## 2022-12-07 DIAGNOSIS — R262 Difficulty in walking, not elsewhere classified: Secondary | ICD-10-CM | POA: Insufficient documentation

## 2022-12-07 DIAGNOSIS — M4316 Spondylolisthesis, lumbar region: Secondary | ICD-10-CM | POA: Insufficient documentation

## 2022-12-07 NOTE — Telephone Encounter (Signed)
We did not order lab work I think she was supposed to be at Dr [employer] office

## 2022-12-07 NOTE — Telephone Encounter (Signed)
I called her to advise left message to have them contact Dr Lodema Hong

## 2022-12-07 NOTE — Therapy (Signed)
[facility] OUTPATIENT PHYSICAL THERAPY EVALUATION (THORACOLUMBAR)   Patient Name: RIANN OMAN MRN: [id number] DOB:[birth year], 77 y.o., female Today's Date: 12/07/2022  END OF SESSION:   PT End of Session - 12/07/22 1621     Visit Number 1    Number of Visits 16    Date for PT Re-Evaluation 01/11/23    Authorization Type [employer] Medicare    Progress Note Due on Visit 10    PT Start Time 0315    PT Stop Time 0355    PT Time Calculation (min) 40 min    Activity Tolerance Patient tolerated treatment well    Behavior During Therapy [facility] for tasks assessed/performed              Past Medical History:  Diagnosis Date   Arthritis    Asthma 1963   Cataract    Chest pain with high risk for cardiac etiology 08/21/2017   Diabetes (HCC)    Diabetes mellitus, type 2 (HCC) 2004   GERD (gastroesophageal reflux disease) 2004   Hypertension 2004   Low back pain    Obesity    RAD (reactive airway disease)    Sciatica    Seasonal allergies    Past Surgical History:  Procedure Laterality Date   ACROMIO-CLAVICULAR JOINT REPAIR Right 11/09/2012   Procedure: ACROMIO-CLAVICULAR JOINT REPAIR;  Surgeon: Vickki Hearing, MD;  Location: [facility];  Service: Orthopedics;  Laterality: Right;   COLONOSCOPY N/A 10/10/2016   Procedure: COLONOSCOPY;  Surgeon: West Bali, MD;  Location: [facility];  Service: Endoscopy;  Laterality: N/A;  10:30   EYE SURGERY Right 05/09/2018   cataract   EYE SURGERY Left 2021   cataract   left breast biopsy for benign disease     SHOULDER ACROMIOPLASTY Right 11/09/2012   Procedure: RIGHT SHOULDER ACROMIOPLASTY;  Surgeon: Vickki Hearing, MD;  Location: [facility];  Service: Orthopedics;  Laterality: Right;   SHOULDER OPEN ROTATOR CUFF REPAIR Right 11/09/2012   Procedure: ROTATOR CUFF REPAIR Right SHOULDER OPEN;  Surgeon: Vickki Hearing, MD;  Location: [facility];  Service: Orthopedics;  Laterality: Right;   total knee arthroplasty left  02/01/2005   Dr.  Romeo Apple   TUBAL LIGATION  1970   Patient Active Problem List   Diagnosis Date Noted   Chronic pain 10/20/2022   Hypertriglyceridemia 09/07/2022   H/O adenomatous polyp of colon 05/10/2022   Acute foreign body of ear canal, left, initial encounter 05/04/2022   Annual visit for general adult medical examination with abnormal findings 01/09/2022   Unsteady gait when walking 05/16/2021   Anxiety 05/11/2020   History of colonic polyps 03/11/2020   Chronic cough 12/18/2019   Urinary frequency 10/05/2019   Tubular adenoma of colon 12/04/2018   Nocturnal hypoxia 07/03/2014   Allergic rhinitis 03/20/2013   Itching of ear 02/17/2012   Mixed hyperlipidemia 11/20/2010   Cough variant asthma 11/27/2009   Obesity (BMI 30.0-34.9) 06/21/2009   Gastroesophageal reflux disease 06/21/2009   Primary hypertension 03/08/2009   SHOULDER, ARTHRITIS, DEGEN./OSTEO 08/28/2007   SCIATICA 06/28/2007   Type 2 diabetes mellitus with vascular disease (HCC) 11/28/2006    PCP: Kerri Perches, MDPCP - General   REFERRING PROVIDER:   Vickki Hearing, MD   REFERRING DIAG:  [phone] (ICD-10-CM) - Chronic left-sided low back pain with left-sided sciatica  M16.11 (ICD-10-CM) - Arthritis of right hip  M43.16 (ICD-10-CM) - Spondylolisthesis, lumbar region  M51.36 (ICD-10-CM) - DDD (degenerative disc disease), lumbar    Rationale for  Evaluation and Treatment: Rehabilitation  THERAPY DIAG:  Difficulty in walking, not elsewhere classified - Plan: PT plan of care cert/re-cert  Right knee pain, unspecified chronicity - Plan: PT plan of care cert/re-cert  Muscle weakness (generalized) - Plan: PT plan of care cert/re-cert  Other low back pain - Plan: PT plan of care cert/re-cert  ONSET DATE: 7-8 months --------------------------------------------------------------------------------------------- SUBJECTIVE:                                                                                                                                                                                            SUBJECTIVE STATEMENT: Patient states that her LBP began 7-8 months ago. Patient attempted to manage pain with PCP by changing medicine but it did not help. Patient was not having any improvements so she decided to see Dr. Romeo Apple 12/05/22. MD did XR of Lumbar/ Hip - see below. Pt referred to OPPT     PERTINENT HISTORY:  Right RTC Surgery 2014 Left TKR 2016   PAIN:  Are you having pain? Yes: NPRS scale: 9/10 Pain location: starts low back down the right lower extremity  Pain description: constantly Aggravating factors: sleeping, walking  Relieving factors: pain medicine  PRECAUTIONS: None  RED FLAGS: None   WEIGHT BEARING RESTRICTIONS: No  FALLS:  Has patient fallen in last 6 months? 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   PLOF: Independent   PATIENT GOALS: To move pain-free   NEXT MD VISIT: TBD   --------------------------------------------------------------------------------------------- OBJECTIVE:   DIAGNOSTIC FINDINGS:  L4-5 spondylolisthesis and severe L3-4 disc space narrowing consistent with moderate to severe lumbar spondylosis  Impression mild arthritis of the right hip    PATIENT SURVEYS:  FOTO 37.1500  SCREENING FOR RED FLAGS: Bowel or bladder incontinence: No Spinal tumors: No Cauda equina syndrome: No Compression fracture: No Abdominal aneurysm: No  COGNITION: Overall cognitive status: Within functional limits for tasks assessed  POSTURE: decreased lumbar lordosis, flexed trunk , and weight shift left      FUNCTIONAL TESTS:  Timed up and go (TUG): Test next session    GAIT ANALYSIS: Distance walked: 10 feet   Assistive device utilized: Single point cane Level of assistance: CGA Comments: Patient with right UE single point cane which causes moderate antalgia onto right lower extremity; right lower extremity maintained in flexion through gait; moderate unsteadiness contact guard assist   SENSATION: WFL   LUMBAR ROM-seated:  AROM eval  Flexion WFL  Extension WFL   (Blank rows = not tested; * = limited by pain)  LOWER EXTREMITY MMT:    Left ankle/hip/knee grossly 4-/5 except:  Right ankle/hip/knee grossly 3+/5 except:   Right Hip flexion: 3-/5  Right knee flexion: 3-/5  Right knee extension: 3-/5   LOWER EXTREMITY ROM:     Left ankle/hip/knee AROM grossly WFL  Right ankle/hip/knee AROM grossly WFL except:   Right knee flexion 0-100 pain  Right hip flexion 0-100 pain  Right knee extension: lacking 5 degrees   LUMBAR SPECIAL TESTS:  Straight leg raise test: Negative  PALPATION: Moderate tenderness to palpation right adductors  --------------------------------------------------------------------------------------------- TODAY'S TREATMENT:                                                                                                                              DATE:   PT Mod Eval Therapeutic exercise x '8 - Small Range Straight Leg Raise  - 20 reps - Supine Heel Slide  - 20 reps - Beginner Bridge  - 20 reps    PATIENT EDUCATION:  Education details: HEP  Person educated: Patient Education method: Medical illustrator Education comprehension: verbalized understanding and returned demonstration  HOME EXERCISE PROGRAM: Access Code: [email or web address] URL: [email or web address] Date: 12/07/2022 Prepared by: Seymour Bars  Exercises - Small Range Straight Leg Raise  - 20 reps - Supine Heel Slide  - 20 reps - Beginner Bridge  - 20 reps --------------------------------------------------------------------------------------------- ASSESSMENT:  CLINICAL  IMPRESSION: Patient is a 77 y.o. y.o. female who was seen today for physical therapy evaluation and treatment for difficulty walking, right knee pain, muscle weakness, low back pain. Patient presents to PT with the following objective impairments: Abnormal gait, decreased activity tolerance, decreased balance, decreased endurance, difficulty walking, decreased strength, impaired flexibility, improper body mechanics, and postural dysfunction. These impairments limit the patient in activities such as carrying, lifting, bending, standing, squatting, stairs, transfers, bathing, toileting, dressing, locomotion level, and caring for others. These impairments also limit the patient in participation such as meal prep, cleaning, laundry, interpersonal relationship, shopping, community activity, and yard work. The patient will benefit from PT to address the limitations/impairments listed below to return to their prior level of function in the domains of activity and participation.    PERSONAL FACTORS: 1-2 comorbidities: see Pertinent History Above  are also affecting patient's functional outcome.   REHAB POTENTIAL: Good  CLINICAL DECISION MAKING: Stable/uncomplicated  EVALUATION COMPLEXITY: Moderate  --------------------------------------------------------------------------------------------- GOALS: Goals reviewed with patient? No  SHORT TERM GOALS: Target date: 01/04/2023    Patient will be able to score a >/= 42 on the FOTO    to demonstrate an improvement in overall housework, ADL completion, mobility, and self-care. Baseline: Goal status: INITIAL  2.  Patient will demonstrate right knee Flexion active range of motion to [facility] needed for home and community ambulation  Baseline:  Goal status: INITIAL  3. Patient will be independent with a basic  stretching/strengthening HEP with pain levels of 2-3/10   Baseline:  Goal status: INITIAL   LONG TERM GOALS: Target date: 02/01/2023    Patient will  be able to score a >/= 42 on the FOTO  to demonstrate an improvement in overall housework, ADL completion, mobility, and self-care. Baseline: Baseline:  Goal status: INITIAL  2.   Patient will be able to ambulate around the PT clinic using a left UE single point cane with a 2 point step through pattern (independently) to facilitate safe ambulation  Baseline: contact guard assist; moderate unsteadiness  Goal status: INITIAL  3.  Patient will be independent with a comprehensive strengthening HEP with pain levels of 1-2/10   Baseline:  Goal status: INITIAL  --------------------------------------------------------------------------------------------- PLAN:  PT FREQUENCY: 2x/week  PT DURATION: 8 weeks  PLANNED INTERVENTIONS: Therapeutic exercises, Therapeutic activity, Neuromuscular re-education, Balance training, Gait training, Patient/Family education, Self Care, Joint mobilization, Joint manipulation, Stair training, Dry Needling, Spinal manipulation, Spinal mobilization, Cryotherapy, Moist heat, and Manual therapy.  PLAN FOR NEXT SESSION: Complete TUG; begin lower extremity strength    Seymour Bars, PT 12/07/2022, 4:22 PM

## 2022-12-07 NOTE — Telephone Encounter (Signed)
-----   Message from [place] B sent at 12/07/2022 10:51 AM EDT ----- Regarding: Lab Work The patient daughter Hilda Lias came in the office today wanting to know about the patients lab work.  They went to [facility] to get the lab work done and they told her no orders was in the system.    Please call Hilda Lias back at [phone]

## 2022-12-12 ENCOUNTER — Encounter (HOSPITAL_COMMUNITY): Payer: Self-pay

## 2022-12-12 ENCOUNTER — Other Ambulatory Visit: Payer: Self-pay | Admitting: Family Medicine

## 2022-12-12 ENCOUNTER — Ambulatory Visit (HOSPITAL_COMMUNITY): Payer: Medicare Other

## 2022-12-12 DIAGNOSIS — R262 Difficulty in walking, not elsewhere classified: Secondary | ICD-10-CM

## 2022-12-12 DIAGNOSIS — M25561 Pain in right knee: Secondary | ICD-10-CM

## 2022-12-12 DIAGNOSIS — M1611 Unilateral primary osteoarthritis, right hip: Secondary | ICD-10-CM | POA: Diagnosis not present

## 2022-12-12 DIAGNOSIS — M6281 Muscle weakness (generalized): Secondary | ICD-10-CM | POA: Diagnosis not present

## 2022-12-12 DIAGNOSIS — M5442 Lumbago with sciatica, left side: Secondary | ICD-10-CM | POA: Diagnosis not present

## 2022-12-12 DIAGNOSIS — M51369 Other intervertebral disc degeneration, lumbar region without mention of lumbar back pain or lower extremity pain: Secondary | ICD-10-CM | POA: Diagnosis not present

## 2022-12-12 DIAGNOSIS — G8929 Other chronic pain: Secondary | ICD-10-CM | POA: Diagnosis not present

## 2022-12-12 DIAGNOSIS — M5459 Other low back pain: Secondary | ICD-10-CM | POA: Diagnosis not present

## 2022-12-12 DIAGNOSIS — M4316 Spondylolisthesis, lumbar region: Secondary | ICD-10-CM | POA: Diagnosis not present

## 2022-12-12 NOTE — Therapy (Signed)
[facility] OUTPATIENT PHYSICAL THERAPY EVALUATION (THORACOLUMBAR)   Patient Name: Tiffany Jacobs MRN: [id number] DOB:09-11-1945, 77 y.o., female Today's Date: 12/12/2022  END OF SESSION:   PT End of Session - 12/12/22 1514     Visit Number 2    Number of Visits 16    Date for PT Re-Evaluation 01/11/23    Authorization Type [employer] Medicare    Progress Note Due on Visit 10    PT Start Time 1430    PT Stop Time 1513    PT Time Calculation (min) 43 min    Activity Tolerance Patient tolerated treatment well    Behavior During Therapy [facility] for tasks assessed/performed               Past Medical History:  Diagnosis Date   Arthritis    Asthma 1963   Cataract    Chest pain with high risk for cardiac etiology 08/21/2017   Diabetes (HCC)    Diabetes mellitus, type 2 (HCC) 2004   GERD (gastroesophageal reflux disease) 2004   Hypertension 2004   Low back pain    Obesity    RAD (reactive airway disease)    Sciatica    Seasonal allergies    Past Surgical History:  Procedure Laterality Date   ACROMIO-CLAVICULAR JOINT REPAIR Right 11/09/2012   Procedure: ACROMIO-CLAVICULAR JOINT REPAIR;  Surgeon: Vickki Hearing, MD;  Location: [facility];  Service: Orthopedics;  Laterality: Right;   COLONOSCOPY N/A 10/10/2016   Procedure: COLONOSCOPY;  Surgeon: [place] Bali, MD;  Location[facility];  Service: Endoscopy;  Laterality: N/A;  10:30   EYE SURGERY Right 05/09/2018   cataract   EYE SURGERY Left 2021   cataract   left breast biopsy for benign disease     SHOULDER ACROMIOPLASTY Right 11/09/2012   Procedure: RIGHT SHOULDER ACROMIOPLASTY;  Surgeon: Vickki Hearing, MD;  Location: [facility];  Service: Orthopedics;  Laterality: Right;   SHOULDER OPEN ROTATOR CUFF REPAIR Right 11/09/2012   Procedure: ROTATOR CUFF REPAIR Right SHOULDER OPEN;  Surgeon: Vickki Hearing, MD;  Location: [facility];  Service: Orthopedics;  Laterality: Right;   total knee arthroplasty left  02/01/2005   Dr.  Romeo Apple   TUBAL LIGATION  1970   Patient Active Problem List   Diagnosis Date Noted   Chronic pain 10/20/2022   Hypertriglyceridemia 09/07/2022   H/O adenomatous polyp of colon 05/10/2022   Acute foreign body of ear canal, left, initial encounter 05/04/2022   Annual visit for general adult medical examination with abnormal findings 01/09/2022   Unsteady gait when walking 05/16/2021   Anxiety 05/11/2020   History of colonic polyps 03/11/2020   Chronic cough 12/18/2019   Urinary frequency 10/05/2019   Tubular adenoma of colon 12/04/2018   Nocturnal hypoxia 07/03/2014   Allergic rhinitis 03/20/2013   Itching of ear 02/17/2012   Mixed hyperlipidemia 11/20/2010   Cough variant asthma 11/27/2009   Obesity (BMI 30.0-34.9) 06/21/2009   Gastroesophageal reflux disease 06/21/2009   Primary hypertension 03/08/2009   SHOULDER, ARTHRITIS, DEGEN./OSTEO 08/28/2007   SCIATICA 06/28/2007   Type 2 diabetes mellitus with vascular disease (HCC) 11/28/2006    PCP: Kerri Perches, MDPCP - General   REFERRING PROVIDER:   Vickki Hearing, MD   REFERRING DIAG:  [phone] (ICD-10-CM) - Chronic left-sided low back pain with left-sided sciatica  M16.11 (ICD-10-CM) - Arthritis of right hip  M43.16 (ICD-10-CM) - Spondylolisthesis, lumbar region  M51.36 (ICD-10-CM) - DDD (degenerative disc disease), lumbar    Rationale  for Evaluation and Treatment: Rehabilitation  THERAPY DIAG:  Difficulty in walking, not elsewhere classified  Right knee pain, unspecified chronicity  Muscle weakness (generalized)  ONSET DATE: 7-8 months --------------------------------------------------------------------------------------------- SUBJECTIVE:                                                                                                                                                                                           SUBJECTIVE STATEMENT: No reports of pain today. Pt reporting exercising  over the weekend. Pt reporting new diet changes.   Eval: Patient states that her LBP began 7-8 months ago. Patient attempted to manage pain with PCP by changing medicine but it did not help. Patient was not having any improvements so she decided to see Dr. Romeo Apple 12/05/22. MD did XR of Lumbar/ Hip - see below. Pt referred to OPPT     PERTINENT HISTORY:  Right RTC Surgery 2014 Left TKR 2016   PAIN:  Are you having pain? Yes: NPRS scale: 9/10 Pain location: starts low back down the right lower extremity  Pain description: constantly Aggravating factors: sleeping, walking  Relieving factors: pain medicine  PRECAUTIONS: None  RED FLAGS: None   WEIGHT BEARING RESTRICTIONS: No  FALLS:  Has patient fallen in last 6 months? 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   PLOF: Independent   PATIENT GOALS: To move pain-free   NEXT MD VISIT: TBD   --------------------------------------------------------------------------------------------- OBJECTIVE:   DIAGNOSTIC FINDINGS:  L4-5 spondylolisthesis and severe L3-4 disc space narrowing consistent with moderate to severe lumbar spondylosis  Impression mild arthritis of the right hip    PATIENT SURVEYS:  FOTO 37.1500  SCREENING FOR RED FLAGS: Bowel or bladder incontinence: No Spinal tumors: No Cauda equina syndrome: No Compression fracture: No Abdominal aneurysm: No  COGNITION: Overall cognitive status: Within functional limits for tasks assessed  POSTURE: decreased lumbar lordosis, flexed trunk , and weight shift left      FUNCTIONAL TESTS:  Timed up and go (TUG): 21 seconds as of 12/12/22   GAIT ANALYSIS: Distance walked: 10 feet  Assistive device utilized: Single point cane Level of  assistance: CGA Comments: Patient with right UE single point cane which causes moderate antalgia onto right lower extremity; right lower extremity maintained in flexion through gait; moderate unsteadiness contact guard assist   SENSATION: WFL   LUMBAR ROM-seated:   AROM eval  Flexion WFL  Extension WFL   (Blank rows =  not tested; * = limited by pain)  LOWER EXTREMITY MMT:    Left ankle/hip/knee grossly 4-/5 except:  Right ankle/hip/knee grossly 3+/5 except:   Right Hip flexion: 3-/5  Right knee flexion: 3-/5  Right knee extension: 3-/5   LOWER EXTREMITY ROM:     Left ankle/hip/knee AROM grossly WFL  Right ankle/hip/knee AROM grossly WFL except:   Right knee flexion 0-100 pain  Right hip flexion 0-100 pain  Right knee extension: lacking 5 degrees   LUMBAR SPECIAL TESTS:  Straight leg raise test: Negative  PALPATION: Moderate tenderness to palpation right adductors  --------------------------------------------------------------------------------------------- TODAY'S TREATMENT:                                                                                                                              DATE:   12/12/2022  -TUG: 21 seconds -Attempting Sit/stands with symmetrical weight shift but increased c/o LBP-terminated -Standing R knee flexion 2 x 12 with 3' isometric -Seated thomas stretch 2 x 30' - R side -Reporting 7/10 pain currently with TE.  -Standing hip abduction vector 3x3' with reduced ROM due to c/o LBP -Seated pallos press with YTB 1 x 10 bilaterally    PT Mod Eval Therapeutic exercise x '8 - Small Range Straight Leg Raise  - 20 reps - Supine Heel Slide  - 20 reps - Beginner Bridge  - 20 reps    PATIENT EDUCATION:  Education details: HEP  Person educated: Patient Education method: Medical illustrator Education comprehension: verbalized understanding and returned demonstration  HOME EXERCISE PROGRAM: Access Code: [email or web address] URL:  [email or web address] Date: 12/07/2022 Prepared by: Seymour Bars  Exercises - Small Range Straight Leg Raise  - 20 reps - Supine Heel Slide  - 20 reps - Beginner Bridge  - 20 reps  Access Code: [email or web address] URL: [email or web address] Date: 12/12/2022 Prepared by: Starling Manns  Exercises - Standing Alternating Knee Flexion  - 1 x daily - 7 x weekly - 3 sets - 10 reps - Standing Hip Abduction  - 1 x daily - 7 x weekly - 3 sets - 10 reps --------------------------------------------------------------------------------------------- ASSESSMENT:  CLINICAL IMPRESSION: Pt tolerating first treatment session well. Focused on continuing to build HEP. Overview of goals. Pt noted with c/o LBP throughout standing gluteal strengthening which limited standing tolerance. Pt showing continued excessive lumbar extension feeding into L4-5 Spondylolisthesis. Pt educated priority to maintain neutral spine throughout movement and was given reduced ROM for movement. Pt with excessive trunk compensations due to muscle weakness. Benefits from slow, guided movements with cuing  Eval:Patient is a 77 y.o. y.o. female who was seen today for physical therapy evaluation and treatment for difficulty walking, right knee pain, muscle weakness, low back pain. Patient presents to PT with the following objective impairments: Abnormal gait, decreased activity tolerance, decreased balance, decreased endurance, difficulty walking, decreased strength, impaired flexibility, improper body mechanics, and postural dysfunction. These impairments limit the patient in activities such as carrying, lifting, bending, standing, squatting, stairs,  transfers, bathing, toileting, dressing, locomotion level, and caring for others. These impairments also limit the patient in participation such as meal prep, cleaning, laundry, interpersonal relationship, shopping, community activity, and yard work. The patient will benefit  from PT to address the limitations/impairments listed below to return to their prior level of function in the domains of activity and participation.    PERSONAL FACTORS: 1-2 comorbidities: see Pertinent History Above  are also affecting patient's functional outcome.   REHAB POTENTIAL: Good  CLINICAL DECISION MAKING: Stable/uncomplicated  EVALUATION COMPLEXITY: Moderate  --------------------------------------------------------------------------------------------- GOALS: Goals reviewed with patient? No  SHORT TERM GOALS: Target date: 01/04/2023    Patient will be able to score a >/= 42 on the FOTO    to demonstrate an improvement in overall housework, ADL completion, mobility, and self-care. Baseline: Goal status: INITIAL  2.  Patient will demonstrate right knee Flexion active range of motion to [facility] needed for home and community ambulation  Baseline:  Goal status: INITIAL  3. Patient will be independent with a basic stretching/strengthening HEP with pain levels of 2-3/10   Baseline:  Goal status: INITIAL   LONG TERM GOALS: Target date: 02/01/2023    Patient will be able to score a >/= 42 on the FOTO  to demonstrate an improvement in overall housework, ADL completion, mobility, and self-care. Baseline: Baseline:  Goal status: INITIAL  2.   Patient will be able to ambulate around the PT clinic using a left UE single point cane with a 2 point step through pattern (independently) to facilitate safe ambulation  Baseline: contact guard assist; moderate unsteadiness  Goal status: INITIAL  3.  Patient will be independent with a comprehensive strengthening HEP with pain levels of 1-2/10   Baseline:  Goal status: INITIAL  --------------------------------------------------------------------------------------------- PLAN:  PT FREQUENCY: 2x/week  PT DURATION: 8 weeks  PLANNED INTERVENTIONS: Therapeutic exercises, Therapeutic activity, Neuromuscular re-education, Balance  training, Gait training, Patient/Family education, Self Care, Joint mobilization, Joint manipulation, Stair training, Dry Needling, Spinal manipulation, Spinal mobilization, Cryotherapy, Moist heat, and Manual therapy.  PLAN FOR NEXT SESSION:  begin lower extremity strength; core stability, limited excessive hip/lumbar extension activities Nelida Meuse PT, DPT Physical Therapist with [facility] [phone] office  Nelida Meuse, PT 12/12/2022, 3:15 PM

## 2022-12-15 ENCOUNTER — Encounter (HOSPITAL_COMMUNITY): Payer: Medicare Other

## 2022-12-20 ENCOUNTER — Encounter (HOSPITAL_COMMUNITY): Payer: Self-pay

## 2022-12-20 ENCOUNTER — Ambulatory Visit (HOSPITAL_COMMUNITY): Payer: Medicare Other

## 2022-12-20 DIAGNOSIS — M25561 Pain in right knee: Secondary | ICD-10-CM

## 2022-12-20 DIAGNOSIS — R262 Difficulty in walking, not elsewhere classified: Secondary | ICD-10-CM

## 2022-12-20 DIAGNOSIS — M6281 Muscle weakness (generalized): Secondary | ICD-10-CM

## 2022-12-20 NOTE — Therapy (Signed)
[facility] OUTPATIENT PHYSICAL THERAPY TREATMENT (THORACOLUMBAR)   Patient Name: Tiffany Jacobs MRN: [id number] DOB:1945-12-28, 77 y.o., female Today's Date: 12/20/2022  END OF SESSION:   PT End of Session - 12/20/22 1341     Visit Number 3    Number of Visits 16    Date for PT Re-Evaluation 01/11/23    Authorization Type UHC Medicare    Authorization - Visit Number 3    PT Start Time 1301    PT Stop Time 1336    PT Time Calculation (min) 35 min    Activity Tolerance Treatment limited secondary to medical complications (Comment)                Past Medical History:  Diagnosis Date   Arthritis    Asthma 1963   Cataract    Chest pain with high risk for cardiac etiology 08/21/2017   Diabetes (HCC)    Diabetes mellitus, type 2 (HCC) 2004   GERD (gastroesophageal reflux disease) 2004   Hypertension 2004   Low back pain    Obesity    RAD (reactive airway disease)    Sciatica    Seasonal allergies    Past Surgical History:  Procedure Laterality Date   ACROMIO-CLAVICULAR JOINT REPAIR Right 11/09/2012   Procedure: ACROMIO-CLAVICULAR JOINT REPAIR;  Surgeon: Vickki Hearing, MD;  Location: [facility];  Service: Orthopedics;  Laterality: Right;   COLONOSCOPY N/A 10/10/2016   Procedure: COLONOSCOPY;  Surgeon: West Bali, MD;  Location: [facility];  Service: Endoscopy;  Laterality: N/A;  10:30   EYE SURGERY Right 05/09/2018   cataract   EYE SURGERY Left 2021   cataract   left breast biopsy for benign disease     SHOULDER ACROMIOPLASTY Right 11/09/2012   Procedure: RIGHT SHOULDER ACROMIOPLASTY;  Surgeon: Vickki Hearing, MD;  Location: [facility];  Service: Orthopedics;  Laterality: Right;   SHOULDER OPEN ROTATOR CUFF REPAIR Right 11/09/2012   Procedure: ROTATOR CUFF REPAIR Right SHOULDER OPEN;  Surgeon: Vickki Hearing, MD;  Location: [facility];  Service: Orthopedics;  Laterality: Right;   total knee arthroplasty left  02/01/2005   Dr. Romeo Apple   TUBAL LIGATION  1970    Patient Active Problem List   Diagnosis Date Noted   Chronic pain 10/20/2022   Hypertriglyceridemia 09/07/2022   H/O adenomatous polyp of colon 05/10/2022   Acute foreign body of ear canal, left, initial encounter 05/04/2022   Annual visit for general adult medical examination with abnormal findings 01/09/2022   Unsteady gait when walking 05/16/2021   Anxiety 05/11/2020   History of colonic polyps 03/11/2020   Chronic cough 12/18/2019   Urinary frequency 10/05/2019   Tubular adenoma of colon 12/04/2018   Nocturnal hypoxia 07/03/2014   Allergic rhinitis 03/20/2013   Itching of ear 02/17/2012   Mixed hyperlipidemia 11/20/2010   Cough variant asthma 11/27/2009   Obesity (BMI 30.0-34.9) 06/21/2009   Gastroesophageal reflux disease 06/21/2009   Primary hypertension 03/08/2009   SHOULDER, ARTHRITIS, DEGEN./OSTEO 08/28/2007   SCIATICA 06/28/2007   Type 2 diabetes mellitus with vascular disease (HCC) 11/28/2006    PCP: Kerri Perches, MDPCP - General   REFERRING PROVIDER:   Vickki Hearing, MD   REFERRING DIAG:  [phone] (ICD-10-CM) - Chronic left-sided low back pain with left-sided sciatica  M16.11 (ICD-10-CM) - Arthritis of right hip  M43.16 (ICD-10-CM) - Spondylolisthesis, lumbar region  M51.36 (ICD-10-CM) - DDD (degenerative disc disease), lumbar    Rationale for Evaluation and Treatment: Rehabilitation  THERAPY  DIAG:  Difficulty in walking, not elsewhere classified  Right knee pain, unspecified chronicity  Muscle weakness (generalized)  ONSET DATE: 7-8 months --------------------------------------------------------------------------------------------- SUBJECTIVE:                                                                                                                                                                                           SUBJECTIVE STATEMENT: Pt reporting bilateral shoulder pain that runs down to arms and finger tips. Has  been reporting loss of appetite and extreme fatigue recently. .   Eval: Patient states that her LBP began 7-8 months ago. Patient attempted to manage pain with PCP by changing medicine but it did not help. Patient was not having any improvements so she decided to see Dr. Romeo Apple 12/05/22. MD did XR of Lumbar/ Hip - see below. Pt referred to OPPT     PERTINENT HISTORY:  Right RTC Surgery 2014 Left TKR 2016   PAIN:  Are you having pain? Yes: NPRS scale: 9/10 Pain location: starts low back down the right lower extremity  Pain description: constantly Aggravating factors: sleeping, walking  Relieving factors: pain medicine  PRECAUTIONS: None  RED FLAGS: None   WEIGHT BEARING RESTRICTIONS: No  FALLS:  Has patient fallen in last 6 months? 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   PLOF: Independent   PATIENT GOALS: To move pain-free   NEXT MD VISIT: TBD   --------------------------------------------------------------------------------------------- OBJECTIVE:   DIAGNOSTIC FINDINGS:  L4-5 spondylolisthesis and severe L3-4 disc space narrowing consistent with moderate to severe lumbar spondylosis  Impression mild arthritis of the right hip    PATIENT SURVEYS:  FOTO [phone]  SCREENING FOR RED FLAGS: Bowel or bladder incontinence: No Spinal tumors: No Cauda equina syndrome: No Compression fracture: No Abdominal aneurysm: No  COGNITION: Overall cognitive status: Within functional limits for tasks assessed  POSTURE: decreased lumbar lordosis, flexed trunk , and weight shift left      FUNCTIONAL TESTS:  Timed up and go (TUG): 21 seconds as of 12/12/22   GAIT ANALYSIS: Distance walked: 10 feet  Assistive device utilized: Single point  cane Level of assistance: CGA Comments: Patient with right UE single point cane which causes moderate antalgia onto right lower extremity; right lower extremity maintained in flexion through gait; moderate unsteadiness contact guard assist   SENSATION: WFL   LUMBAR ROM-seated:   AROM eval  Flexion WFL  Extension WFL   (Blank  rows = not tested; * = limited by pain)  LOWER EXTREMITY MMT:    Left ankle/hip/knee grossly 4-/5 except:  Right ankle/hip/knee grossly 3+/5 except:   Right Hip flexion: 3-/5  Right knee flexion: 3-/5  Right knee extension: 3-/5   LOWER EXTREMITY ROM:     Left ankle/hip/knee AROM grossly WFL  Right ankle/hip/knee AROM grossly WFL except:   Right knee flexion 0-100 pain  Right hip flexion 0-100 pain  Right knee extension: lacking 5 degrees   LUMBAR SPECIAL TESTS:  Straight leg raise test: Negative  PALPATION: Moderate tenderness to palpation right adductors  --------------------------------------------------------------------------------------------- TODAY'S TREATMENT:                                                                                                                              DATE:  12/20/2022  -Assessed BP in sitting 149/79 with HR 110. -rested 5 minutes in supine: reassessed: Supine BP 109/67 HR: 103 -Sitting BP: 146/93; HR 105   12/12/2022  -TUG: 21 seconds -Attempting Sit/stands with symmetrical weight shift but increased c/o LBP-terminated -Standing R knee flexion 2 x 12 with 3' isometric -Seated thomas stretch 2 x 30' - R side -Reporting 7/10 pain currently with TE.  -Standing hip abduction vector 3x3' with reduced ROM due to c/o LBP -Seated pallos press with YTB 1 x 10 bilaterally    PT Mod Eval Therapeutic exercise x '8 - Small Range Straight Leg Raise  - 20 reps - Supine Heel Slide  - 20 reps - Beginner Bridge  - 20 reps    PATIENT EDUCATION:  Education details: HEP  Person educated: Patient Education  method: Medical illustrator Education comprehension: verbalized understanding and returned demonstration  HOME EXERCISE PROGRAM: Access Code: [email or web address] URL: [email or web address] Date: 12/07/2022 Prepared by: Seymour Bars  Exercises - Small Range Straight Leg Raise  - 20 reps - Supine Heel Slide  - 20 reps - Beginner Bridge  - 20 reps  Access Code: [email or web address] URL: [email or web address] Date: 12/12/2022 Prepared by: Starling Manns  Exercises - Standing Alternating Knee Flexion  - 1 x daily - 7 x weekly - 3 sets - 10 reps - Standing Hip Abduction  - 1 x daily - 7 x weekly - 3 sets - 10 reps --------------------------------------------------------------------------------------------- ASSESSMENT:  CLINICAL IMPRESSION: Treatment session terminated today due to elevated HR and pt reporting not feeling well and feeling not herself. See Objective for BP and HR findings. Pt and daughter counseled to either visit PCP or potential Urgent Care for further medical advice as symptoms are not related to reason for PT visit..  Eval:Patient is a 77 y.o. y.o. female who was seen today for physical therapy evaluation and treatment for difficulty walking, right knee pain, muscle weakness, low back pain. Patient presents to PT with the following objective impairments: Abnormal gait, decreased activity tolerance, decreased balance, decreased endurance, difficulty walking, decreased strength, impaired flexibility, improper body mechanics, and postural dysfunction. These impairments limit the patient  in activities such as carrying, lifting, bending, standing, squatting, stairs, transfers, bathing, toileting, dressing, locomotion level, and caring for others. These impairments also limit the patient in participation such as meal prep, cleaning, laundry, interpersonal relationship, shopping, community activity, and yard work. The patient will benefit from PT to address the  limitations/impairments listed below to return to their prior level of function in the domains of activity and participation.    PERSONAL FACTORS: 1-2 comorbidities: see Pertinent History Above  are also affecting patient's functional outcome.   REHAB POTENTIAL: Good  CLINICAL DECISION MAKING: Stable/uncomplicated  EVALUATION COMPLEXITY: Moderate  --------------------------------------------------------------------------------------------- GOALS: Goals reviewed with patient? No  SHORT TERM GOALS: Target date: 01/04/2023    Patient will be able to score a >/= 42 on the FOTO    to demonstrate an improvement in overall housework, ADL completion, mobility, and self-care. Baseline: Goal status: INITIAL  2.  Patient will demonstrate right knee Flexion active range of motion to [facility] needed for home and community ambulation  Baseline:  Goal status: INITIAL  3. Patient will be independent with a basic stretching/strengthening HEP with pain levels of 2-3/10   Baseline:  Goal status: INITIAL   LONG TERM GOALS: Target date: 02/01/2023    Patient will be able to score a >/= 42 on the FOTO  to demonstrate an improvement in overall housework, ADL completion, mobility, and self-care. Baseline: Baseline:  Goal status: INITIAL  2.   Patient will be able to ambulate around the PT clinic using a left UE single point cane with a 2 point step through pattern (independently) to facilitate safe ambulation  Baseline: contact guard assist; moderate unsteadiness  Goal status: INITIAL  3.  Patient will be independent with a comprehensive strengthening HEP with pain levels of 1-2/10   Baseline:  Goal status: INITIAL  --------------------------------------------------------------------------------------------- PLAN:  PT FREQUENCY: 2x/week  PT DURATION: 8 weeks  PLANNED INTERVENTIONS: Therapeutic exercises, Therapeutic activity, Neuromuscular re-education, Balance training, Gait training,  Patient/Family education, Self Care, Joint mobilization, Joint manipulation, Stair training, Dry Needling, Spinal manipulation, Spinal mobilization, Cryotherapy, Moist heat, and Manual therapy.  PLAN FOR NEXT SESSION:  begin lower extremity strength; core stability, limited excessive hip/lumbar extension activities Nelida Meuse PT, DPT Physical Therapist with [facility] [phone] office  Nelida Meuse, PT 12/20/2022, 1:41 PM

## 2022-12-21 ENCOUNTER — Ambulatory Visit: Payer: Medicare Other | Admitting: Family Medicine

## 2022-12-22 ENCOUNTER — Encounter (HOSPITAL_COMMUNITY): Payer: Medicare Other

## 2022-12-22 ENCOUNTER — Encounter: Payer: Self-pay | Admitting: Family Medicine

## 2022-12-23 ENCOUNTER — Other Ambulatory Visit: Payer: Self-pay | Admitting: Family Medicine

## 2022-12-27 ENCOUNTER — Encounter (HOSPITAL_COMMUNITY): Payer: Medicare Other

## 2022-12-29 ENCOUNTER — Ambulatory Visit (HOSPITAL_COMMUNITY): Payer: Medicare Other

## 2022-12-29 DIAGNOSIS — R262 Difficulty in walking, not elsewhere classified: Secondary | ICD-10-CM

## 2022-12-29 DIAGNOSIS — M25561 Pain in right knee: Secondary | ICD-10-CM

## 2022-12-29 DIAGNOSIS — M6281 Muscle weakness (generalized): Secondary | ICD-10-CM | POA: Diagnosis not present

## 2022-12-29 DIAGNOSIS — M4316 Spondylolisthesis, lumbar region: Secondary | ICD-10-CM | POA: Diagnosis not present

## 2022-12-29 DIAGNOSIS — G8929 Other chronic pain: Secondary | ICD-10-CM | POA: Diagnosis not present

## 2022-12-29 DIAGNOSIS — M5459 Other low back pain: Secondary | ICD-10-CM | POA: Diagnosis not present

## 2022-12-29 DIAGNOSIS — M1611 Unilateral primary osteoarthritis, right hip: Secondary | ICD-10-CM | POA: Diagnosis not present

## 2022-12-29 DIAGNOSIS — M51369 Other intervertebral disc degeneration, lumbar region without mention of lumbar back pain or lower extremity pain: Secondary | ICD-10-CM | POA: Diagnosis not present

## 2022-12-29 DIAGNOSIS — M5442 Lumbago with sciatica, left side: Secondary | ICD-10-CM | POA: Diagnosis not present

## 2022-12-29 NOTE — Therapy (Addendum)
[facility] OUTPATIENT PHYSICAL THERAPY TREATMENT    Patient Name: Tiffany Jacobs MRN: [id number] DOB:[birth year], 77 y.o., female Today's Date: 12/29/2022  END OF SESSION:   PT End of Session - 12/29/22 1413     Visit Number 4    Number of Visits 16    Date for PT Re-Evaluation 01/11/23    Authorization Type UHC Medicare    Progress Note Due on Visit 10    PT Start Time 1345    PT Stop Time 1425    PT Time Calculation (min) 40 min    Activity Tolerance Treatment limited secondary to medical complications (Comment)    Behavior During Therapy [facility] for tasks assessed/performed                Past Medical History:  Diagnosis Date   Arthritis    Asthma 1963   Cataract    Chest pain with high risk for cardiac etiology 08/21/2017   Diabetes (HCC)    Diabetes mellitus, type 2 (HCC) 2004   GERD (gastroesophageal reflux disease) 2004   Hypertension 2004   Low back pain    Obesity    RAD (reactive airway disease)    Sciatica    Seasonal allergies    Past Surgical History:  Procedure Laterality Date   ACROMIO-CLAVICULAR JOINT REPAIR Right 11/09/2012   Procedure: ACROMIO-CLAVICULAR JOINT REPAIR;  Surgeon: Vickki Hearing, MD;  Location: [facility];  Service: Orthopedics;  Laterality: Right;   COLONOSCOPY N/A 10/10/2016   Procedure: COLONOSCOPY;  Surgeon: West Bali, MD;  [facility];  Service: Endoscopy;  Laterality: N/A;  10:30   EYE SURGERY Right 05/09/2018   cataract   EYE SURGERY Left 2021   cataract   left breast biopsy for benign disease     SHOULDER ACROMIOPLASTY Right 11/09/2012   Procedure: RIGHT SHOULDER ACROMIOPLASTY;  Surgeon: Vickki Hearing, MD;  Location: [facility];  Service: Orthopedics;  Laterality: Right;   SHOULDER OPEN ROTATOR CUFF REPAIR Right 11/09/2012   Procedure: ROTATOR CUFF REPAIR Right SHOULDER OPEN;  Surgeon: Vickki Hearing, MD;  Location: [facility];  Service: Orthopedics;  Laterality: Right;   total knee arthroplasty left   02/01/2005   Dr. Romeo Apple   TUBAL LIGATION  1970   Patient Active Problem List   Diagnosis Date Noted   Chronic pain 10/20/2022   Hypertriglyceridemia 09/07/2022   H/O adenomatous polyp of colon 05/10/2022   Acute foreign body of ear canal, left, initial encounter 05/04/2022   Annual visit for general adult medical examination with abnormal findings 01/09/2022   Unsteady gait when walking 05/16/2021   Anxiety 05/11/2020   History of colonic polyps 03/11/2020   Chronic cough 12/18/2019   Urinary frequency 10/05/2019   Tubular adenoma of colon 12/04/2018   Nocturnal hypoxia 07/03/2014   Allergic rhinitis 03/20/2013   Itching of ear 02/17/2012   Mixed hyperlipidemia 11/20/2010   Cough variant asthma 11/27/2009   Obesity (BMI 30.0-34.9) 06/21/2009   Gastroesophageal reflux disease 06/21/2009   Primary hypertension 03/08/2009   SHOULDER, ARTHRITIS, DEGEN./OSTEO 08/28/2007   SCIATICA 06/28/2007   Type 2 diabetes mellitus with vascular disease (HCC) 11/28/2006    PCP: Kerri Perches, MDPCP - General   REFERRING PROVIDER:   Vickki Hearing, MD   REFERRING DIAG:  [phone] (ICD-10-CM) - Chronic left-sided low back pain with left-sided sciatica  M16.11 (ICD-10-CM) - Arthritis of right hip  M43.16 (ICD-10-CM) - Spondylolisthesis, lumbar region  M51.36 (ICD-10-CM) - DDD (degenerative disc disease), lumbar  Rationale for Evaluation and Treatment: Rehabilitation  THERAPY DIAG:  Difficulty in walking, not elsewhere classified  Right knee pain, unspecified chronicity  Muscle weakness (generalized)  Other low back pain  ONSET DATE: 7-8 months --------------------------------------------------------------------------------------------- SUBJECTIVE:                                                                                                                                                                                           SUBJECTIVE STATEMENT: Pt  reporting bilateral shoulder pain that runs down to arms and finger tips. Has been reporting loss of appetite and extreme fatigue recently. .   Eval: Patient states that her LBP began 7-8 months ago. Patient attempted to manage pain with PCP by changing medicine but it did not help. Patient was not having any improvements so she decided to see Dr. Romeo Apple 12/05/22. MD did XR of Lumbar/ Hip - see below. Pt referred to OPPT     PERTINENT HISTORY:  Right RTC Surgery 2014 Left TKR 2016   PAIN:  Are you having pain? Yes: NPRS scale: 9/10 Pain location: starts low back down the right lower extremity  Pain description: constantly Aggravating factors: sleeping, walking  Relieving factors: pain medicine  PRECAUTIONS: None  RED FLAGS: None   WEIGHT BEARING RESTRICTIONS: No  FALLS:  Has patient fallen in last 6 months? 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   PLOF: Independent   PATIENT GOALS: To move pain-free   NEXT MD VISIT: TBD   --------------------------------------------------------------------------------------------- OBJECTIVE:   --------------------------------------------------------------------------------------------- TODAY'S TREATMENT:                                                                                                                              DATE:   12/29/22: -forward ball roll outs for lumbar flexion x20, then 5 with lateral devation *no visble ROM in lumbar spine due to hip joint  hypomobility, pt denies feeling any mobility in spine durign this.   -STS x10, RTB clam x10, ball squeeze x10, brace marching c RTB x30 (alternating) *sit break -STS x10, RTB clam x10, ball squeeze x10, brace marching c RTB x30  (alternating) *sit break Lateral side stepping at counter top, PRN hand support, cues for teenie tiny steps: 5 counter lengths bilat *sit break Lateral side stepping at counter top, PRN hand support, cues for teenie tiny steps: 5 counter lengths bilat *sit break Lateral side stepping at counter top, PRN hand support, cues for teenie tiny steps: 5 counter lengths bilat *sit break   12/20/2022  -Assessed BP in sitting 149/79 with HR 110. -rested 5 minutes in supine: reassessed: Supine BP 109/67 HR: 103 -Sitting BP: 146/93; HR 105   12/12/2022  -TUG: 21 seconds -Attempting Sit/stands with symmetrical weight shift but increased c/o LBP-terminated -Standing R knee flexion 2 x 12 with 3' isometric -Seated thomas stretch 2 x 30' - R side -Reporting 7/10 pain currently with TE.  -Standing hip abduction vector 3x3' with reduced ROM due to c/o LBP -Seated pallos press with YTB 1 x 10 bilaterally  PT Mod Eval Therapeutic exercise x '8 - Small Range Straight Leg Raise  - 20 reps - Supine Heel Slide  - 20 reps - Beginner Bridge  - 20 reps    PATIENT EDUCATION:  Education details: HEP  Person educated: Patient Education method: Medical illustrator Education comprehension: verbalized understanding and returned demonstration  HOME EXERCISE PROGRAM: Access Code: [email or web address] URL: [email or web address] Date: 12/07/2022 Prepared by: Seymour Bars  Exercises - Small Range Straight Leg Raise  - 20 reps - Supine Heel Slide  - 20 reps - Beginner Bridge  - 20 reps  Access Code: [email or web address] URL: [email or web address] Date: 12/12/2022 Prepared by: Starling Manns  Exercises - Standing Alternating Knee Flexion  - 1 x daily - 7 x weekly - 3 sets - 10 reps - Standing Hip Abduction  - 1 x daily - 7 x weekly - 3 sets - 10 reps --------------------------------------------------------------------------------------------- ASSESSMENT:  CLINICAL IMPRESSION: Pt  returns to clinic after several days hiatus following a session here ended early due to feeling ill and elevated HR 100s. Pt did not make it to PCP followup and ended up canceling 2 visits here. TOday she is back to her normal self. She has no pain on arrival. Gait stil showing significant long-term gait changes in frontal plane, but she stil is determined to not use SPC when possible. Good tolerance to session overall. Difficult to obtain lumbar flexion stretch due to hypermobility of hips in flexion. No pain in session, but weakness and fatigue of lateral hip stabilizers doeswarrant seated recovery intervals. Pt is going to benefit from PT intervention to achieve her LT goals of care.   PERSONAL FACTORS: 1-2 comorbidities: see Pertinent History Above  are also affecting patient's functional outcome.   REHAB POTENTIAL: Good  CLINICAL DECISION MAKING: Stable/uncomplicated  EVALUATION COMPLEXITY: Moderate  --------------------------------------------------------------------------------------------- GOALS: Goals reviewed with patient? No  SHORT TERM GOALS: Target date: 01/04/2023    Patient will be able to score a >/= 42 on the FOTO    to demonstrate an improvement in overall housework, ADL completion, mobility, and self-care. Baseline: Goal status: INITIAL  2.  Patient will demonstrate right knee Flexion active range of motion to [facility] needed for home and community ambulation  Baseline:  Goal status: INITIAL  3. Patient will be independent with a basic stretching/strengthening HEP with pain levels of 2-3/10  Baseline:  Goal status: INITIAL   LONG TERM GOALS: Target date: 02/01/2023   Patient will be able to score a >/= 42 on the FOTO  to demonstrate an improvement in overall housework, ADL completion, mobility, and self-care. Baseline: Baseline:  Goal status: INITIAL  2.   Patient will be able to ambulate around the PT clinic using a left UE single point cane with a 2 point step  through pattern (independently) to facilitate safe ambulation  Baseline: contact guard assist; moderate unsteadiness  Goal status: INITIAL  3.  Patient will be independent with a comprehensive strengthening HEP with pain levels of 1-2/10   Baseline:  Goal status: INITIAL  --------------------------------------------------------------------------------------------- PLAN:  PT FREQUENCY: 2x/week  PT DURATION: 8 weeks  PLANNED INTERVENTIONS: Therapeutic exercises, Therapeutic activity, Neuromuscular re-education, Balance training, Gait training, Patient/Family education, Self Care, Joint mobilization, Joint manipulation, Stair training, Dry Needling, Spinal manipulation, Spinal mobilization, Cryotherapy, Moist heat, and Manual therapy.  PLAN FOR NEXT SESSION:  cont core/leg strength especially those hips  Rosamaria Lints, PT Physical Therapist [facility]  [phone]   Suraiya Ordoyne C, PT 12/29/2022, 2:19 PM

## 2023-01-03 ENCOUNTER — Ambulatory Visit (HOSPITAL_COMMUNITY): Payer: Medicare Other

## 2023-01-03 DIAGNOSIS — M5459 Other low back pain: Secondary | ICD-10-CM | POA: Diagnosis not present

## 2023-01-03 DIAGNOSIS — M5442 Lumbago with sciatica, left side: Secondary | ICD-10-CM | POA: Diagnosis not present

## 2023-01-03 DIAGNOSIS — R262 Difficulty in walking, not elsewhere classified: Secondary | ICD-10-CM

## 2023-01-03 DIAGNOSIS — M4316 Spondylolisthesis, lumbar region: Secondary | ICD-10-CM | POA: Diagnosis not present

## 2023-01-03 DIAGNOSIS — M6281 Muscle weakness (generalized): Secondary | ICD-10-CM

## 2023-01-03 DIAGNOSIS — M51369 Other intervertebral disc degeneration, lumbar region without mention of lumbar back pain or lower extremity pain: Secondary | ICD-10-CM | POA: Diagnosis not present

## 2023-01-03 DIAGNOSIS — M1611 Unilateral primary osteoarthritis, right hip: Secondary | ICD-10-CM | POA: Diagnosis not present

## 2023-01-03 DIAGNOSIS — M25561 Pain in right knee: Secondary | ICD-10-CM | POA: Diagnosis not present

## 2023-01-03 DIAGNOSIS — G8929 Other chronic pain: Secondary | ICD-10-CM | POA: Diagnosis not present

## 2023-01-03 NOTE — Therapy (Signed)
[facility] OUTPATIENT PHYSICAL THERAPY TREATMENT    Patient Name: FRANCY MONT MRN: [id number] DOB:1945-08-29, 77 y.o., female Today's Date: 01/03/2023  END OF SESSION:   PT End of Session - 01/03/23 1304     Visit Number 5    Number of Visits 16    Date for PT Re-Evaluation 01/11/23    Authorization Type [employer] Medicare    Progress Note Due on Visit 10    PT Start Time 1301    PT Stop Time 1341    PT Time Calculation (min) 40 min    Activity Tolerance Treatment limited secondary to medical complications (Comment)    Behavior During Therapy [facility] for tasks assessed/performed                Past Medical History:  Diagnosis Date   Arthritis    Asthma 1963   Cataract    Chest pain with high risk for cardiac etiology 08/21/2017   Diabetes (HCC)    Diabetes mellitus, type 2 (HCC) 2004   GERD (gastroesophageal reflux disease) 2004   Hypertension 2004   Low back pain    Obesity    RAD (reactive airway disease)    Sciatica    Seasonal allergies    Past Surgical History:  Procedure Laterality Date   ACROMIO-CLAVICULAR JOINT REPAIR Right 11/09/2012   Procedure: ACROMIO-CLAVICULAR JOINT REPAIR;  Surgeon: Vickki Hearing, MD;  Location: [facility];  Service: Orthopedics;  Laterality: Right;   COLONOSCOPY N/A 10/10/2016   Procedure: COLONOSCOPY;  Surgeon: West Bali, MD;  Location: [facility];  Service: Endoscopy;  Laterality: N/A;  10:30   EYE SURGERY Right 05/09/2018   cataract   EYE SURGERY Left 2021   cataract   left breast biopsy for benign disease     SHOULDER ACROMIOPLASTY Right 11/09/2012   Procedure: RIGHT SHOULDER ACROMIOPLASTY;  Surgeon: Vickki Hearing, MD;  Location: [facility];  Service: Orthopedics;  Laterality: Right;   SHOULDER OPEN ROTATOR CUFF REPAIR Right 11/09/2012   Procedure: ROTATOR CUFF REPAIR Right SHOULDER OPEN;  Surgeon: Vickki Hearing, MD;  Location: [facility];  Service: Orthopedics;  Laterality: Right;   total knee arthroplasty left   02/01/2005   Dr. Romeo Apple   TUBAL LIGATION  1970   Patient Active Problem List   Diagnosis Date Noted   Chronic pain 10/20/2022   Hypertriglyceridemia 09/07/2022   H/O adenomatous polyp of colon 05/10/2022   Acute foreign body of ear canal, left, initial encounter 05/04/2022   Annual visit for general adult medical examination with abnormal findings 01/09/2022   Unsteady gait when walking 05/16/2021   Anxiety 05/11/2020   History of colonic polyps 03/11/2020   Chronic cough 12/18/2019   Urinary frequency 10/05/2019   Tubular adenoma of colon 12/04/2018   Nocturnal hypoxia 07/03/2014   Allergic rhinitis 03/20/2013   Itching of ear 02/17/2012   Mixed hyperlipidemia 11/20/2010   Cough variant asthma 11/27/2009   Obesity (BMI 30.0-34.9) 06/21/2009   Gastroesophageal reflux disease 06/21/2009   Primary hypertension 03/08/2009   SHOULDER, ARTHRITIS, DEGEN./OSTEO 08/28/2007   SCIATICA 06/28/2007   Type 2 diabetes mellitus with vascular disease (HCC) 11/28/2006    PCP: Kerri Perches, MDPCP - General   REFERRING PROVIDER:   Vickki Hearing, MD   REFERRING DIAG:  [phone] (ICD-10-CM) - Chronic left-sided low back pain with left-sided sciatica  M16.11 (ICD-10-CM) - Arthritis of right hip  M43.16 (ICD-10-CM) - Spondylolisthesis, lumbar region  M51.36 (ICD-10-CM) - DDD (degenerative disc disease), lumbar  Rationale for Evaluation and Treatment: Rehabilitation  THERAPY DIAG:  Difficulty in walking, not elsewhere classified  Right knee pain, unspecified chronicity  Muscle weakness (generalized)  Other low back pain  ONSET DATE: 7-8 months --------------------------------------------------------------------------------------------- SUBJECTIVE:                                                                                                                                                                                           SUBJECTIVE STATEMENT: Pt doing  well overall. Shoulder feeling much better but still limited in use. Legs and back feeling better.    Eval: Patient states that her LBP began 7-8 months ago. Patient attempted to manage pain with PCP by changing medicine but it did not help. Patient was not having any improvements so she decided to see Dr. Romeo Apple 12/05/22. MD did XR of Lumbar/ Hip - see below. Pt referred to OPPT     PERTINENT HISTORY:  Right RTC Surgery 2014 Left TKR 2016   PAIN:  Are you having pain? None today   PRECAUTIONS: None   WEIGHT BEARING RESTRICTIONS: No  FALLS:  Has patient fallen in last 6 months? No   PATIENT GOALS: To move pain-free   NEXT MD VISIT: TBD   --------------------------------------------------------------------------------------------- OBJECTIVE:   --------------------------------------------------------------------------------------------- TODAY'S TREATMENT:                                                                                                                              DATE:   01/03/23: -STS x10, RTB clam x15, ball squeeze x15, brace marching c RTB x30 (alternating) increased this date in some *sit break -STS x10, RTB clam x15, ball squeeze x15, brace marching c RTB x30 (alternating)  *sit break  -Seated bend to floor and rise to upright x12 -Seated seated trunk rotation in chair, alternating sides x20  -Lateral side stepping at counter top, PRN hand support, cues for teenie tiny steps: 5 counter lengths bilat + RTB loop at knee level -Seated bend to floor and rise to upright x12 -Seated seated trunk rotation in chair, alternating sides x20 -Lateral side stepping at  counter top, PRN hand support, cues for teenie tiny steps: 5 counter lengths bilat + RTB loop at knee level  -FOTO: 64 -Rt knee flexion 117 (stiffness without pain) -Lt knee flexion 120 (stiffness without pain)  12/29/22: -forward ball roll outs for lumbar flexion x20, then 5 with lateral  devation *no visble ROM in lumbar spine due to hip joint hypomobility, pt denies feeling any mobility in spine durign this.   -STS x10, RTB clam x10, ball squeeze x10, brace marching c RTB x30 (alternating) *sit break -STS x10, RTB clam x10, ball squeeze x10, brace marching c RTB x30 (alternating) *sit break Lateral side stepping at counter top, PRN hand support, cues for teenie tiny steps: 5 counter lengths bilat *sit break Lateral side stepping at counter top, PRN hand support, cues for teenie tiny steps: 5 counter lengths bilat *sit break Lateral side stepping at counter top, PRN hand support, cues for teenie tiny steps: 5 counter lengths bilat *sit break    PATIENT EDUCATION:  Education details: HEP  Person educated: Patient Education method: Medical illustrator Education comprehension: verbalized understanding and returned demonstration  HOME EXERCISE PROGRAM: Access Code: [email or web address] URL: [email or web address] Date: 12/07/2022 Prepared by: Seymour Bars  Exercises - Small Range Straight Leg Raise  - 20 reps - Supine Heel Slide  - 20 reps - Beginner Bridge  - 20 reps  Access Code: [email or web address] URL: [email or web address] Date: 12/12/2022 Prepared by: Starling Manns  Exercises - Standing Alternating Knee Flexion  - 1 x daily - 7 x weekly - 3 sets - 10 reps - Standing Hip Abduction  - 1 x daily - 7 x weekly - 3 sets - 10 reps --------------------------------------------------------------------------------------------- ASSESSMENT:  CLINICAL IMPRESSION: Pain remains very good, at goal. Pt not doing HEP recently, rather exercises she could recall performing in session- cues to correct this and return to 2 assigned packets. Pt is progressing nicely to LT goals of care, many ST goals met, LT FOTO goal met, ROM looking great. Pt is going to benefit from PT intervention to achieve her LT goals of care.   PERSONAL FACTORS: 1-2 comorbidities:  see Pertinent History Above  are also affecting patient's functional outcome.   REHAB POTENTIAL: Good  CLINICAL DECISION MAKING: Stable/uncomplicated  EVALUATION COMPLEXITY: Moderate  --------------------------------------------------------------------------------------------- GOALS: Goals reviewed with patient? No  SHORT TERM GOALS: Target date: 01/04/2023    Patient will be able to score a >/= 42 on the FOTO    to demonstrate an improvement in overall housework, ADL completion, mobility, and self-care. Baseline: 01/03/23: FOTO 64 Goal status: INITIAL  2.  Patient will demonstrate right knee Flexion active range of motion to [facility] needed for home and community ambulation  Baseline:  01/03/23: ~120 degrees bilat, up from 100 degrees Goal status: INITIAL  3. Patient will be independent with a basic stretching/strengthening HEP with pain levels of 2-3/10   Baseline:  Goal status: INITIAL   LONG TERM GOALS: Target date: 02/01/2023   Patient will be able to score a >/= 42 on the FOTO  to demonstrate an improvement in overall housework, ADL completion, mobility, and self-care. Baseline: Baseline: 01/03/23: FOTO 64 Goal status: INITIAL  2.   Patient will be able to ambulate around the PT clinic using a left UE single point cane with a 2 point step through pattern (independently) to facilitate safe ambulation  Baseline: contact guard assist; moderate unsteadiness  Goal status: INITIAL  3.  Patient will be independent with a comprehensive strengthening HEP with pain levels  of 1-2/10   Baseline:  Goal status: INITIAL  --------------------------------------------------------------------------------------------- PLAN:  PT FREQUENCY: 2x/week  PT DURATION: 8 weeks  PLANNED INTERVENTIONS: Therapeutic exercises, Therapeutic activity, Neuromuscular re-education, Balance training, Gait training, Patient/Family education, Self Care, Joint mobilization, Joint manipulation, Stair  training, Dry Needling, Spinal manipulation, Spinal mobilization, Cryotherapy, Moist heat, and Manual therapy.  PLAN FOR NEXT SESSION:  cont core/leg strength especially those hips  Rosamaria Lints, PT Physical Therapist [facility]  [phone]   Sarp Vernier C, PT 01/03/2023, 1:06 PM

## 2023-01-05 ENCOUNTER — Encounter (HOSPITAL_COMMUNITY): Payer: Medicare Other

## 2023-01-05 LAB — HM DIABETES EYE EXAM

## 2023-01-10 ENCOUNTER — Encounter (HOSPITAL_COMMUNITY): Payer: Self-pay

## 2023-01-10 ENCOUNTER — Ambulatory Visit (HOSPITAL_COMMUNITY): Payer: Medicare Other | Attending: Orthopedic Surgery

## 2023-01-10 DIAGNOSIS — M25561 Pain in right knee: Secondary | ICD-10-CM | POA: Diagnosis not present

## 2023-01-10 DIAGNOSIS — M5459 Other low back pain: Secondary | ICD-10-CM | POA: Insufficient documentation

## 2023-01-10 DIAGNOSIS — R262 Difficulty in walking, not elsewhere classified: Secondary | ICD-10-CM | POA: Insufficient documentation

## 2023-01-10 DIAGNOSIS — M6281 Muscle weakness (generalized): Secondary | ICD-10-CM | POA: Diagnosis not present

## 2023-01-10 NOTE — Therapy (Signed)
[facility] OUTPATIENT PHYSICAL THERAPY TREATMENT    Patient Name: Tiffany Jacobs MRN: [id number] DOB:31-Mar-1945, 77 y.o., female Today's Date: 01/10/2023  END OF SESSION:   PT End of Session - 01/10/23 1353     Visit Number 6    Number of Visits 16    Date for PT Re-Evaluation 02/01/23    Authorization Type UHC Medicare    Authorization - Visit Number 4    Progress Note Due on Visit 10    PT Start Time 1350    PT Stop Time 1429    PT Time Calculation (min) 39 min    Equipment Utilized During Treatment Gait belt    Activity Tolerance Patient tolerated treatment well    Behavior During Therapy WFL for tasks assessed/performed                 Past Medical History:  Diagnosis Date   Arthritis    Asthma 1963   Cataract    Chest pain with high risk for cardiac etiology 08/21/2017   Diabetes (HCC)    Diabetes mellitus, type 2 (HCC) 2004   GERD (gastroesophageal reflux disease) 2004   Hypertension 2004   Low back pain    Obesity    RAD (reactive airway disease)    Sciatica    Seasonal allergies    Past Surgical History:  Procedure Laterality Date   ACROMIO-CLAVICULAR JOINT REPAIR Right 11/09/2012   Procedure: ACROMIO-CLAVICULAR JOINT REPAIR;  Surgeon: Vickki Hearing, MD;  Location: [facility];  Service: Orthopedics;  Laterality: Right;   COLONOSCOPY N/A 10/10/2016   Procedure: COLONOSCOPY;  Surgeon: West Bali, MD;  Location[facility];  Service: Endoscopy;  Laterality: N/A;  10:30   EYE SURGERY Right 05/09/2018   cataract   EYE SURGERY Left 2021   cataract   left breast biopsy for benign disease     SHOULDER ACROMIOPLASTY Right 11/09/2012   Procedure: RIGHT SHOULDER ACROMIOPLASTY;  Surgeon: Vickki Hearing, MD;  Location: [facility];  Service: Orthopedics;  Laterality: Right;   SHOULDER OPEN ROTATOR CUFF REPAIR Right 11/09/2012   Procedure: ROTATOR CUFF REPAIR Right SHOULDER OPEN;  Surgeon: Vickki Hearing, MD;  Location: [facility];  Service: Orthopedics;   Laterality: Right;   total knee arthroplasty left  02/01/2005   Dr. Romeo Apple   TUBAL LIGATION  1970   Patient Active Problem List   Diagnosis Date Noted   Chronic pain 10/20/2022   Hypertriglyceridemia 09/07/2022   H/O adenomatous polyp of colon 05/10/2022   Acute foreign body of ear canal, left, initial encounter 05/04/2022   Annual visit for general adult medical examination with abnormal findings 01/09/2022   Unsteady gait when walking 05/16/2021   Anxiety 05/11/2020   History of colonic polyps 03/11/2020   Chronic cough 12/18/2019   Urinary frequency 10/05/2019   Tubular adenoma of colon 12/04/2018   Nocturnal hypoxia 07/03/2014   Allergic rhinitis 03/20/2013   Itching of ear 02/17/2012   Mixed hyperlipidemia 11/20/2010   Cough variant asthma 11/27/2009   Obesity (BMI 30.0-34.9) 06/21/2009   Gastroesophageal reflux disease 06/21/2009   Primary hypertension 03/08/2009   SHOULDER, ARTHRITIS, DEGEN./OSTEO 08/28/2007   SCIATICA 06/28/2007   Type 2 diabetes mellitus with vascular disease (HCC) 11/28/2006    PCP: Kerri Perches, MDPCP - General   REFERRING PROVIDER:   Vickki Hearing, MD   REFERRING DIAG:  [phone] (ICD-10-CM) - Chronic left-sided low back pain with left-sided sciatica  M16.11 (ICD-10-CM) - Arthritis of right hip  M43.16 (ICD-10-CM) - Spondylolisthesis, lumbar region  M51.36 (ICD-10-CM) - DDD (degenerative disc disease), lumbar    Rationale for Evaluation and Treatment: Rehabilitation  THERAPY DIAG:  Difficulty in walking, not elsewhere classified  Right knee pain, unspecified chronicity  Muscle weakness (generalized)  ONSET DATE: 7-8 months --------------------------------------------------------------------------------------------- SUBJECTIVE:                                                                                                                                                                                            SUBJECTIVE STATEMENT: No pain, did not followup with PCP about elevated Heart rate. "I'm not going to lie, I have not been doing it" Pt reports losing HEP paper. .  Eval: Patient states that her LBP began 7-8 months ago. Patient attempted to manage pain with PCP by changing medicine but it did not help. Patient was not having any improvements so she decided to see Dr. Romeo Apple 12/05/22. MD did XR of Lumbar/ Hip - see below. Pt referred to OPPT     PERTINENT HISTORY:  Right RTC Surgery 2014 Left TKR 2016   PAIN:  Are you having pain? None today   PRECAUTIONS: None   WEIGHT BEARING RESTRICTIONS: No  FALLS:  Has patient fallen in last 6 months? No   PATIENT GOALS: To move pain-free   NEXT MD VISIT: TBD   --------------------------------------------------------------------------------------------- OBJECTIVE:   --------------------------------------------------------------------------------------------- TODAY'S TREATMENT:                                                                                                                              DATE:  01/10/2023  -Standing hip abduction with RTB x 30 bilaterally -length of // bars Side stepping with GTB 2 x 6 -Sit/stands x 10 w/ GTB around knees with 2K gram ball  -6in stepups with single UE support w/ hip hinge for gluteal activation x 10 -6in lateral stepups w/ BUE support x 5 bilaterally -Tandem stance on 6in box with foam pad for hip extension  activation. 2 x 30'RLE   01/03/23: -STS x10, RTB clam x15, ball squeeze x15, brace marching  c RTB x30 (alternating) increased this date in some *sit break -STS x10, RTB clam x15, ball squeeze x15, brace marching c RTB x30 (alternating)  *sit break  -Seated bend to floor and rise to upright x12 -Seated seated trunk rotation in chair, alternating sides x20  -Lateral side stepping at counter top, PRN hand support, cues for teenie tiny steps: 5 counter lengths bilat + RTB loop at  knee level -Seated bend to floor and rise to upright x12 -Seated seated trunk rotation in chair, alternating sides x20 -Lateral side stepping at counter top, PRN hand support, cues for teenie tiny steps: 5 counter lengths bilat + RTB loop at knee level  -FOTO: 64 -Rt knee flexion 117 (stiffness without pain) -Lt knee flexion 120 (stiffness without pain)  12/29/22: -forward ball roll outs for lumbar flexion x20, then 5 with lateral devation *no visble ROM in lumbar spine due to hip joint hypomobility, pt denies feeling any mobility in spine durign this.   -STS x10, RTB clam x10, ball squeeze x10, brace marching c RTB x30 (alternating) *sit break -STS x10, RTB clam x10, ball squeeze x10, brace marching c RTB x30 (alternating) *sit break Lateral side stepping at counter top, PRN hand support, cues for teenie tiny steps: 5 counter lengths bilat *sit break Lateral side stepping at counter top, PRN hand support, cues for teenie tiny steps: 5 counter lengths bilat *sit break Lateral side stepping at counter top, PRN hand support, cues for teenie tiny steps: 5 counter lengths bilat *sit break    PATIENT EDUCATION:  Education details: HEP  Person educated: Patient Education method: Medical illustrator Education comprehension: verbalized understanding and returned demonstration  HOME EXERCISE PROGRAM: Access Code: [email or web address] URL: [email or web address] Date: 01/10/2023 Prepared by: Starling Manns  Exercises - Standing Alternating Knee Flexion  - 1 x daily - 7 x weekly - 3 sets - 10 reps - Standing Hip Abduction  - 1 x daily - 7 x weekly - 3 sets - 10 reps - Supine Bridge  - 1 x daily - 7 x weekly - 3 sets - 10 reps - Standing Partial Squat  - 1 x daily - 7 x weekly - 3 sets - 10 reps --------------------------------------------------------------------------------------------- ASSESSMENT:  CLINICAL IMPRESSION: Pt tolerating session well. HR monitored  intermittently and was 89 Bpm during rest break and 101 bpm during side stepping, no SOB observed this session. Pt showing greater weakness through Rt hip, showing bodily compensation when negotiation six inch step. Re-evaluation coming up on 11/27 at end POC. No c/o pain this session. Pt is given CGA due to swaying with functional activities. Pt is going to benefit from PT intervention to achieve her LT goals of care.   PERSONAL FACTORS: 1-2 comorbidities: see Pertinent History Above  are also affecting patient's functional outcome.   REHAB POTENTIAL: Good  CLINICAL DECISION MAKING: Stable/uncomplicated  EVALUATION COMPLEXITY: Moderate  --------------------------------------------------------------------------------------------- GOALS: Goals reviewed with patient? No  SHORT TERM GOALS: Target date: 01/04/2023    Patient will be able to score a >/= 42 on the FOTO    to demonstrate an improvement in overall housework, ADL completion, mobility, and self-care. Baseline: 01/03/23: FOTO 64 Goal status: INITIAL  2.  Patient will demonstrate right knee Flexion active range of motion to [facility] needed for home and community ambulation  Baseline:  01/03/23: ~120 degrees bilat, up from 100 degrees Goal status: INITIAL  3. Patient will be independent with a basic stretching/strengthening HEP with pain levels of 2-3/10   Baseline:  Goal status: INITIAL   LONG TERM GOALS: Target date: 02/01/2023   Patient will be able to score a >/= 42 on the FOTO  to demonstrate an improvement in overall housework, ADL completion, mobility, and self-care. Baseline: Baseline: 01/03/23: FOTO 64 Goal status: INITIAL  2.   Patient will be able to ambulate around the PT clinic using a left UE single point cane with a 2 point step through pattern (independently) to facilitate safe ambulation  Baseline: contact guard assist; moderate unsteadiness  Goal status: INITIAL  3.  Patient will be independent with a  comprehensive strengthening HEP with pain levels of 1-2/10   Baseline:  Goal status: INITIAL  --------------------------------------------------------------------------------------------- PLAN:  PT FREQUENCY: 2x/week  PT DURATION: 8 weeks  PLANNED INTERVENTIONS: Therapeutic exercises, Therapeutic activity, Neuromuscular re-education, Balance training, Gait training, Patient/Family education, Self Care, Joint mobilization, Joint manipulation, Stair training, Dry Needling, Spinal manipulation, Spinal mobilization, Cryotherapy, Moist heat, and Manual therapy.  PLAN FOR NEXT SESSION:  cont core/leg strength especially those hips  Nelida Meuse PT, DPT Physical Therapist with [facility] [phone] office   Nelida Meuse, PT 01/10/2023, 2:31 PM

## 2023-01-11 ENCOUNTER — Ambulatory Visit: Payer: Medicare Other | Attending: Internal Medicine | Admitting: Internal Medicine

## 2023-01-11 NOTE — Progress Notes (Signed)
Erroneous encounter - please disregard.

## 2023-01-12 ENCOUNTER — Other Ambulatory Visit: Payer: Self-pay | Admitting: Family Medicine

## 2023-01-12 ENCOUNTER — Ambulatory Visit (HOSPITAL_COMMUNITY): Payer: Medicare Other

## 2023-01-12 ENCOUNTER — Encounter: Payer: Self-pay | Admitting: Internal Medicine

## 2023-01-12 DIAGNOSIS — M6281 Muscle weakness (generalized): Secondary | ICD-10-CM | POA: Diagnosis not present

## 2023-01-12 DIAGNOSIS — R262 Difficulty in walking, not elsewhere classified: Secondary | ICD-10-CM | POA: Diagnosis not present

## 2023-01-12 DIAGNOSIS — M25561 Pain in right knee: Secondary | ICD-10-CM

## 2023-01-12 DIAGNOSIS — M5459 Other low back pain: Secondary | ICD-10-CM | POA: Diagnosis not present

## 2023-01-12 NOTE — Therapy (Signed)
[facility] OUT[facility]IENT PHYS[facility]ERAPY TREATMENT    Patient Name: Tiffany Jacobs MRN: [id number] DOB:1945/04/16, 77 y.o., female Today's Date: 01/12/2023  END OF SESSION:   PT End of Session - 01/12/23 1258     Visit Number 7    Number of Visits 16    Date for PT Re-Evaluation 02/01/23    Authorization Type [employer] Medicare    Progress Note Due on Visit 10    PT Start Time 1300    PT Stop Time 1340    PT Time Calculation (min) 40 min    Equipment Utilized During Treatment Gait belt    Activity Tolerance Patient tolerated treatment well    Behavior During Therapy WFL for tasks assessed/performed            Past Medical History:  Diagnosis Date   Arthritis    Asthma 1963   Cataract    Chest pain with high risk for cardiac etiology 08/21/2017   Diabetes (HCC)    Diabetes mellitus, type 2 (HCC) 2004   GERD (gastroesophageal reflux disease) 2004   Hypertension 2004   Low back pain    Obesity    RAD (reactive airway disease)    Sciatica    Seasonal allergies    Past Surgical History:  Procedure Laterality Date   ACROMIO-CLAVICULAR JOINT REPAIR Right 11/09/2012   Procedure: ACROMIO-CLAVICULAR JOINT REPAIR;  Surgeon: Vickki Hearing, MD;  Location: [facility];  Service: Orthopedics;  Laterality: Right;   COLONOSCOPY N/A 10/10/2016   Procedure: COLONOSCOPY;  Surgeon: West Bali, MD;  Location: [facility];  Service: Endoscopy;  Laterality: N/A;  10:30   EYE SURGERY Right 05/09/2018   cataract   EYE SURGERY Left 2021   cataract   left breast biopsy for benign disease     SHOULDER ACROMIOPLASTY Right 11/09/2012   Procedure: RIGHT SHOULDER ACROMIOPLASTY;  Surgeon: Vickki Hearing, MD;  Location: [facility];  Service: Orthopedics;  Laterality: Right;   SHOULDER OPEN ROTATOR CUFF REPAIR Right 11/09/2012   Procedure: ROTATOR CUFF REPAIR Right SHOULDER OPEN;  Surgeon: Vickki Hearing, MD;  Location: [facility];  Service: Orthopedics;  Laterality: Right;   total knee arthroplasty  left  02/01/2005   Dr. Romeo Apple   TUBAL LIGATION  1970   Patient Active Problem List   Diagnosis Date Noted   ERRONEOUS ENCOUNTER--DISREGARD 01/11/2023   Chronic pain 10/20/2022   Hypertriglyceridemia 09/07/2022   H/O adenomatous polyp of colon 05/10/2022   Acute foreign body of ear canal, left, initial encounter 05/04/2022   Annual visit for general adult medical examination with abnormal findings 01/09/2022   Unsteady gait when walking 05/16/2021   Anxiety 05/11/2020   History of colonic polyps 03/11/2020   Chronic cough 12/18/2019   Urinary frequency 10/05/2019   Tubular adenoma of colon 12/04/2018   Nocturnal hypoxia 07/03/2014   Allergic rhinitis 03/20/2013   Itching of ear 02/17/2012   Mixed hyperlipidemia 11/20/2010   Cough variant asthma 11/27/2009   Obesity (BMI 30.0-34.9) 06/21/2009   Gastroesophageal reflux disease 06/21/2009   Primary hypertension 03/08/2009   SHOULDER, ARTHRITIS, DEGEN./OSTEO 08/28/2007   SCIATICA 06/28/2007   Type 2 diabetes mellitus with vascular disease (HCC) 11/28/2006    PCP: Kerri Perches, MDPCP - General   REFERRING PROVIDER:   Vickki Hearing, MD   REFERRING DIAG:  [phone] (ICD-10-CM) - Chronic left-sided low back pain with left-sided sciatica  M16.11 (ICD-10-CM) - Arthritis of right hip  M43.16 (ICD-10-CM) - Spondylolisthesis, lumbar region  M51.36 (  ICD-10-CM) - DDD (degenerative disc disease), lumbar    Rationale for Evaluation and Treatment: Rehabilitation  THERAPY DIAG:  Difficulty in walking, not elsewhere classified  Right knee pain, unspecified chronicity  Muscle weakness (generalized)  ONSET DATE: 7-8 months --------------------------------------------------------------------------------------------- SUBJECTIVE:                                                                                                                                                                                            SUBJECTIVE STATEMENT: Patient denies any low back or knee pain but reports that the R knee gets stiff when she sits for too long.  Eval: Patient states that her LBP began 7-8 months ago. Patient attempted to manage pain with PCP by changing medicine but it did not help. Patient was not having any improvements so she decided to see Dr. Romeo Apple 12/05/22. MD did XR of Lumbar/ Hip - see below. Pt referred to OPPT     PERTINENT HISTORY:  Right RTC Surgery 2014 Left TKR 2016   PAIN:  Are you having pain? None today   PRECAUTIONS: None   WEIGHT BEARING RESTRICTIONS: No  FALLS:  Has patient fallen in last 6 months? No   PATIENT GOALS: To move pain-free   NEXT MD VISIT: TBD   --------------------------------------------------------------------------------------------- OBJECTIVE:   --------------------------------------------------------------------------------------------- TODAY'S TREATMENT:                                                                                                                              DATE:  01/12/23 Seated hamstring stretch x 30" x 3 R knee drives, 12" black stool x 30" x 3 Gastrocnemius slant board stretch x 30" x 3 Hip vector, RTB x 10 x 2 Post pelvic tilts x 3" x 10 x 2 Single knee-to-chest x 15" x 5 Double knee to chest with a physioball x 10 x 2  01/10/2023  -Standing hip abduction with RTB x 30 bilaterally -length of // bars Side stepping with GTB 2 x 6 -Sit/stands x 10 w/ GTB around knees with 2K gram ball  -6in stepups with single UE support  w/ hip hinge for gluteal activation x 10 -6in lateral stepups w/ BUE support x 5 bilaterally -Tandem stance on 6in box with foam pad for hip extension  activation. 2 x 30'RLE   01/03/23: -STS x10, RTB clam x15, ball squeeze x15, brace marching c RTB x30 (alternating) increased this date in some *sit break -STS x10, RTB clam x15, ball squeeze x15, brace marching c RTB x30 (alternating)  *sit  break  -Seated bend to floor and rise to upright x12 -Seated seated trunk rotation in chair, alternating sides x20  -Lateral side stepping at counter top, PRN hand support, cues for teenie tiny steps: 5 counter lengths bilat + RTB loop at knee level -Seated bend to floor and rise to upright x12 -Seated seated trunk rotation in chair, alternating sides x20 -Lateral side stepping at counter top, PRN hand support, cues for teenie tiny steps: 5 counter lengths bilat + RTB loop at knee level  -FOTO: 64 -Rt knee flexion 117 (stiffness without pain) -Lt knee flexion 120 (stiffness without pain)  12/29/22: -forward ball roll outs for lumbar flexion x20, then 5 with lateral devation *no visble ROM in lumbar spine due to hip joint hypomobility, pt denies feeling any mobility in spine durign this.   -STS x10, RTB clam x10, ball squeeze x10, brace marching c RTB x30 (alternating) *sit break -STS x10, RTB clam x10, ball squeeze x10, brace marching c RTB x30 (alternating) *sit break Lateral side stepping at counter top, PRN hand support, cues for teenie tiny steps: 5 counter lengths bilat *sit break Lateral side stepping at counter top, PRN hand support, cues for teenie tiny steps: 5 counter lengths bilat *sit break Lateral side stepping at counter top, PRN hand support, cues for teenie tiny steps: 5 counter lengths bilat *sit break    PATIENT EDUCATION:  Education details: HEP  Person educated: Patient Education method: Medical illustrator Education comprehension: verbalized understanding and returned demonstration  HOME EXERCISE PROGRAM: Access Code: [email or web address] URL: [email or web address] 01/12/2023 - Hooklying Single Knee to Chest Stretch  - 1 x daily - 7 x weekly - 5 reps - 15 hold - Supine Posterior Pelvic Tilt  - 1 x daily - 7 x weekly - 2 sets - 10 reps - 3 hold  Date: 01/10/2023 Prepared by: Starling Manns  Exercises - Standing Alternating Knee Flexion  -  1 x daily - 7 x weekly - 3 sets - 10 reps - Standing Hip Abduction  - 1 x daily - 7 x weekly - 3 sets - 10 reps - Supine Bridge  - 1 x daily - 7 x weekly - 3 sets - 10 reps - Standing Partial Squat  - 1 x daily - 7 x weekly - 3 sets - 10 reps --------------------------------------------------------------------------------------------- ASSESSMENT:  CLINICAL IMPRESSION: Interventions today were geared towards core strengthening, LE strengthening and flexibility. Tolerated all activities without worsening of symptoms. Demonstrated mild levels of fatigue. Initial SpO2 and HR were 90% and 90 respectively. Rest periods provided. Provided mod amount of cueing to ensure correct execution of activity with good carry-over.  SpO2 and HR at the end of session were 90% and 88 respectively.To date, skilled PT is required to address the impairments and improve function.    PERSONAL FACTORS: 1-2 comorbidities: see Pertinent History Above  are also affecting patient's functional outcome.   REHAB POTENTIAL: Good  CLINICAL DECISION MAKING: Stable/uncomplicated  EVALUATION COMPLEXITY: Moderate  --------------------------------------------------------------------------------------------- GOALS: Goals reviewed with patient? No  SHORT TERM GOALS: Target date: 01/04/2023  Patient will be able to score a >/= 42 on the FOTO    to demonstrate an improvement in overall housework, ADL completion, mobility, and self-care. Baseline: 01/03/23: FOTO 64 Goal status: INITIAL  2.  Patient will demonstrate right knee Flexion active range of motion to [facility] needed for home and community ambulation  Baseline:  01/03/23: ~120 degrees bilat, up from 100 degrees Goal status: INITIAL  3. Patient will be independent with a basic stretching/strengthening HEP with pain levels of 2-3/10   Baseline:  Goal status: INITIAL   LONG TERM GOALS: Target date: 02/01/2023   Patient will be able to score a >/= 42 on the FOTO  to  demonstrate an improvement in overall housework, ADL completion, mobility, and self-care. Baseline: Baseline: 01/03/23: FOTO 64 Goal status: INITIAL  2.   Patient will be able to ambulate around the PT clinic using a left UE single point cane with a 2 point step through pattern (independently) to facilitate safe ambulation  Baseline: contact guard assist; moderate unsteadiness  Goal status: INITIAL  3.  Patient will be independent with a comprehensive strengthening HEP with pain levels of 1-2/10   Baseline:  Goal status: INITIAL  --------------------------------------------------------------------------------------------- PLAN:  PT FREQUENCY: 2x/week  PT DURATION: 8 weeks  PLANNED INTERVENTIONS: Therapeutic exercises, Therapeutic activity, Neuromuscular re-education, Balance training, Gait training, Patient/Family education, Self Care, Joint mobilization, Joint manipulation, Stair training, Dry Needling, Spinal manipulation, Spinal mobilization, Cryotherapy, Moist heat, and Manual therapy.  PLAN FOR NEXT SESSION:  cont core/leg strength especially those hips  Iantha Fallen L. Dyasia Firestine, PT, DPT, OCS Physical Therapist with [facility] [phone] office  01/12/2023, 12:59 PM

## 2023-01-13 ENCOUNTER — Encounter: Payer: Self-pay | Admitting: Internal Medicine

## 2023-01-17 ENCOUNTER — Ambulatory Visit (HOSPITAL_COMMUNITY): Payer: Medicare Other

## 2023-01-17 DIAGNOSIS — M25561 Pain in right knee: Secondary | ICD-10-CM | POA: Diagnosis not present

## 2023-01-17 DIAGNOSIS — R262 Difficulty in walking, not elsewhere classified: Secondary | ICD-10-CM

## 2023-01-17 DIAGNOSIS — M5459 Other low back pain: Secondary | ICD-10-CM

## 2023-01-17 DIAGNOSIS — M6281 Muscle weakness (generalized): Secondary | ICD-10-CM | POA: Diagnosis not present

## 2023-01-17 NOTE — Therapy (Signed)
[facility] OUT[facility]IENT PHYSICAL [facility]ERAPY TREATMENT    Patient Name: Tiffany Jacobs MRN: [id number] DOB:May 02, 1945, 77 y.o., female Today's Date: 01/17/2023  END OF SESSION:   PT End of Session - 01/17/23 1355     Visit Number 8    Number of Visits 16    Date for PT Re-Evaluation 02/01/23    Authorization Type [employer] Medicare    Progress Note Due on Visit 10    PT Start Time 1345    PT Stop Time 1425    PT Time Calculation (min) 40 min    Activity Tolerance Patient tolerated treatment well    Behavior During Therapy [facility] for tasks assessed/performed             Past Medical History:  Diagnosis Date   Arthritis    Asthma 1963   Cataract    Chest pain with high risk for cardiac etiology 08/21/2017   Diabetes (HCC)    Diabetes mellitus, type 2 (HCC) 2004   GERD (gastroesophageal reflux disease) 2004   Hypertension 2004   Low back pain    Obesity    RAD (reactive airway disease)    Sciatica    Seasonal allergies    Past Surgical History:  Procedure Laterality Date   ACROMIO-CLAVICULAR JOINT REPAIR Right 11/09/2012   Procedure: ACROMIO-CLAVICULAR JOINT REPAIR;  Surgeon: Vickki Hearing, MD;  Location: [facility];  Service: Orthopedics;  Laterality: Right;   COLONOSCOPY N/A 10/10/2016   Procedure: COLONOSCOPY;  Surgeon: West Bali, MD;  Location: [facility];  Service: Endoscopy;  Laterality: N/A;  10:30   EYE SURGERY Right 05/09/2018   cataract   EYE SURGERY Left 2021   cataract   left breast biopsy for benign disease     SHOULDER ACROMIOPLASTY Right 11/09/2012   Procedure: RIGHT SHOULDER ACROMIOPLASTY;  Surgeon: Vickki Hearing, MD;  Location: [facility];  Service: Orthopedics;  Laterality: Right;   SHOULDER OPEN ROTATOR CUFF REPAIR Right 11/09/2012   Procedure: ROTATOR CUFF REPAIR Right SHOULDER OPEN;  Surgeon: Vickki Hearing, MD;  Location: [facility];  Service: Orthopedics;  Laterality: Right;   total knee arthroplasty left  02/01/2005   Dr. Romeo Apple   TUBAL  LIGATION  1970   Patient Active Problem List   Diagnosis Date Noted   ERRONEOUS ENCOUNTER--DISREGARD 01/11/2023   Chronic pain 10/20/2022   Hypertriglyceridemia 09/07/2022   H/O adenomatous polyp of colon 05/10/2022   Acute foreign body of ear canal, left, initial encounter 05/04/2022   Annual visit for general adult medical examination with abnormal findings 01/09/2022   Unsteady gait when walking 05/16/2021   Anxiety 05/11/2020   History of colonic polyps 03/11/2020   Chronic cough 12/18/2019   Urinary frequency 10/05/2019   Tubular adenoma of colon 12/04/2018   Nocturnal hypoxia 07/03/2014   Allergic rhinitis 03/20/2013   Itching of ear 02/17/2012   Mixed hyperlipidemia 11/20/2010   Cough variant asthma 11/27/2009   Obesity (BMI 30.0-34.9) 06/21/2009   Gastroesophageal reflux disease 06/21/2009   Primary hypertension 03/08/2009   SHOULDER, ARTHRITIS, DEGEN./OSTEO 08/28/2007   SCIATICA 06/28/2007   Type 2 diabetes mellitus with vascular disease (HCC) 11/28/2006    PCP: Kerri Perches, MDPCP - General   REFERRING PROVIDER:   Vickki Hearing, MD   REFERRING DIAG:  [phone] (ICD-10-CM) - Chronic left-sided low back pain with left-sided sciatica  M16.11 (ICD-10-CM) - Arthritis of right hip  M43.16 (ICD-10-CM) - Spondylolisthesis, lumbar region  M51.36 (ICD-10-CM) - DDD (degenerative disc disease), lumbar  Rationale for Evaluation and Treatment: Rehabilitation  THERAPY DIAG:  Difficulty in walking, not elsewhere classified  Muscle weakness (generalized)  Other low back pain  ONSET DATE: 7-8 months --------------------------------------------------------------------------------------------- SUBJECTIVE:                                                                                                                                                                                           SUBJECTIVE STATEMENT: Patient denies any low back pain today.  Patient states that she was okay after the last session. Patient was given meds by her MD which is helping her. Patient now denies shooting pain down the legs. HEP does not cause any shooting pain down the legs.  Eval: Patient states that her LBP began 7-8 months ago. Patient attempted to manage pain with PCP by changing medicine but it did not help. Patient was not having any improvements so she decided to see Dr. Romeo Apple 12/05/22. MD did XR of Lumbar/ Hip - see below. Pt referred to OPPT     PERTINENT HISTORY:  Right RTC Surgery 2014 Left TKR 2016   PAIN:  Are you having pain? None today   PRECAUTIONS: None   WEIGHT BEARING RESTRICTIONS: No  FALLS:  Has patient fallen in last 6 months? No   PATIENT GOALS: To move pain-free   NEXT MD VISIT: TBD   --------------------------------------------------------------------------------------------- OBJECTIVE: All findings are from initial evaluation unless otherwise dated  DIAGNOSTIC FINDINGS:  L4-5 spondylolisthesis and severe L3-4 disc space narrowing consistent with moderate to severe lumbar spondylosis  Impression mild arthritis of the right hip      PATIENT SURVEYS:  FOTO 37.1500   SCREENING FOR RED FLAGS: Bowel or bladder incontinence: No Spinal tumors: No Cauda equina syndrome: No Compression fracture: No Abdominal aneurysm: No   COGNITION: Overall cognitive status: Within functional limits for tasks assessed   POSTURE: decreased lumbar lordosis, flexed trunk , and weight shift left                             FUNCTIONAL TESTS:  Timed up and go (TUG): Test next session      GAIT ANALYSIS: Distance walked: 10 feet  Assistive device utilized: Single point cane Level of assistance: CGA Comments: Patient with right UE single point cane which causes moderate antalgia onto right lower extremity; right lower extremity maintained in flexion through gait; moderate unsteadiness contact guard assist    SENSATION: WFL      LUMBAR ROM-seated:    AROM eval  Flexion WFL  Extension WFL   (Blank rows = not tested; * = limited  by pain)   LOWER EXTREMITY MMT:     Left ankle/hip/knee grossly 4-/5 except:  Right ankle/hip/knee grossly 3+/5 except:              Right Hip flexion: 3-/5             Right knee flexion: 3-/5             Right knee extension: 3-/5    LOWER EXTREMITY ROM:      Left ankle/hip/knee AROM grossly WFL  Right ankle/hip/knee AROM grossly WFL except:              Right knee flexion 0-100 pain             Right hip flexion 0-100 pain             Right knee extension: lacking 5 degrees    LUMBAR SPECIAL TESTS:  Straight leg raise test: Negative   PALPATION: Moderate tenderness to palpation right adductors --------------------------------------------------------------------------------------------- TODAY'S TREATMENT:                                                                                                                              DATE:  01/17/23 NuStep, level 1, seat 8, 5'  Post pelvic tilts x 3" x 10 x 2 Single knee-to-chest x 15" x 5 Double knee to chest with a physioball x 15" x 5 LTR x 1' Seated, abdominal isometrics with physioball, neutral spine x 3" x 10 x 2 Seated, marches, neutral spine x 3" x 10 x 2  01/12/23 Seated hamstring stretch x 30" x 3 R knee drives, 12" black stool x 30" x 3 Gastrocnemius slant board stretch x 30" x 3 Hip vector, RTB x 10 x 2 Post pelvic tilts x 3" x 10 x 2 Single knee-to-chest x 15" x 5 Double knee to chest with a physioball x 10 x 2  01/10/2023  -Standing hip abduction with RTB x 30 bilaterally -length of // bars Side stepping with GTB 2 x 6 -Sit/stands x 10 w/ GTB around knees with 2K gram ball  -6in stepups with single UE support w/ hip hinge for gluteal activation x 10 -6in lateral stepups w/ BUE support x 5 bilaterally -Tandem stance on 6in box with foam pad for hip extension  activation. 2 x 30'RLE   01/03/23: -STS  x10, RTB clam x15, ball squeeze x15, brace marching c RTB x30 (alternating) increased this date in some *sit break -STS x10, RTB clam x15, ball squeeze x15, brace marching c RTB x30 (alternating)  *sit break  -Seated bend to floor and rise to upright x12 -Seated seated trunk rotation in chair, alternating sides x20  -Lateral side stepping at counter top, PRN hand support, cues for teenie tiny steps: 5 counter lengths bilat + RTB loop at knee level -Seated bend to floor and rise to upright x12 -Seated seated trunk rotation in chair, alternating sides x20 -Lateral side stepping at counter  top, PRN hand support, cues for teenie tiny steps: 5 counter lengths bilat + RTB loop at knee level  -FOTO: 64 -Rt knee flexion 117 (stiffness without pain) -Lt knee flexion 120 (stiffness without pain)  12/29/22: -forward ball roll outs for lumbar flexion x20, then 5 with lateral devation *no visble ROM in lumbar spine due to hip joint hypomobility, pt denies feeling any mobility in spine durign this.   -STS x10, RTB clam x10, ball squeeze x10, brace marching c RTB x30 (alternating) *sit break -STS x10, RTB clam x10, ball squeeze x10, brace marching c RTB x30 (alternating) *sit break Lateral side stepping at counter top, PRN hand support, cues for teenie tiny steps: 5 counter lengths bilat *sit break Lateral side stepping at counter top, PRN hand support, cues for teenie tiny steps: 5 counter lengths bilat *sit break Lateral side stepping at counter top, PRN hand support, cues for teenie tiny steps: 5 counter lengths bilat *sit break    PATIENT EDUCATION:  Education details: HEP  Person educated: Patient Education method: Medical illustrator Education comprehension: verbalized understanding and returned demonstration  HOME EXERCISE PROGRAM: Access Code: [email or web address] URL: [email or web address] 01/17/2023 - Seated March  - 1 x daily - 7 x weekly - 3 sets - 10 reps -  Supine Lower Trunk Rotation  - 1 x daily - 7 x weekly  01/12/2023 - Hooklying Single Knee to Chest Stretch  - 1 x daily - 7 x weekly - 5 reps - 15 hold - Supine Posterior Pelvic Tilt  - 1 x daily - 7 x weekly - 2 sets - 10 reps - 3 hold  Date: 01/10/2023 Prepared by: Starling Manns  Exercises - Standing Alternating Knee Flexion  - 1 x daily - 7 x weekly - 3 sets - 10 reps - Standing Hip Abduction  - 1 x daily - 7 x weekly - 3 sets - 10 reps - Supine Bridge  - 1 x daily - 7 x weekly - 3 sets - 10 reps - Standing Partial Squat  - 1 x daily - 7 x weekly - 3 sets - 10 reps --------------------------------------------------------------------------------------------- ASSESSMENT:  CLINICAL IMPRESSION: Interventions today were geared towards core strengthening, LE strengthening and flexibility. Tolerated all activities without worsening of symptoms except when patient reported some pain  = 2/10 on R knee on LTR.  Demonstrated mild levels of fatigue. SpO2 and HR on the exercises were 93% and 87 respectively. Rest periods provided. Provided mild amount of cueing to ensure correct execution of activity with good carry-over. To date, skilled PT is required to address the impairments and improve function.    PERSONAL FACTORS: 1-2 comorbidities: see Pertinent History Above  are also affecting patient's functional outcome.   REHAB POTENTIAL: Good  CLINICAL DECISION MAKING: Stable/uncomplicated  EVALUATION COMPLEXITY: Moderate  --------------------------------------------------------------------------------------------- GOALS: Goals reviewed with patient? No  SHORT TERM GOALS: Target date: 01/04/2023    Patient will be able to score a >/= 42 on the FOTO    to demonstrate an improvement in overall housework, ADL completion, mobility, and self-care. Baseline: 01/03/23: FOTO 64 Goal status: INITIAL  2.  Patient will demonstrate right knee Flexion active range of motion to [facility] needed for home  and community ambulation  Baseline:  01/03/23: ~120 degrees bilat, up from 100 degrees Goal status: INITIAL  3. Patient will be independent with a basic stretching/strengthening HEP with pain levels of 2-3/10   Baseline:  Goal status: INITIAL   LONG TERM GOALS: Target date: 02/01/2023  Patient will be able to score a >/= 42 on the FOTO  to demonstrate an improvement in overall housework, ADL completion, mobility, and self-care. Baseline: Baseline: 01/03/23: FOTO 64 Goal status: INITIAL  2.   Patient will be able to ambulate around the PT clinic using a left UE single point cane with a 2 point step through pattern (independently) to facilitate safe ambulation  Baseline: contact guard assist; moderate unsteadiness  Goal status: INITIAL  3.  Patient will be independent with a comprehensive strengthening HEP with pain levels of 1-2/10   Baseline:  Goal status: INITIAL  --------------------------------------------------------------------------------------------- PLAN:  PT FREQUENCY: 2x/week  PT DURATION: 8 weeks  PLANNED INTERVENTIONS: Therapeutic exercises, Therapeutic activity, Neuromuscular re-education, Balance training, Gait training, Patient/Family education, Self Care, Joint mobilization, Joint manipulation, Stair training, Dry Needling, Spinal manipulation, Spinal mobilization, Cryotherapy, Moist heat, and Manual therapy.  PLAN FOR NEXT SESSION:  cont core/leg strength especially those hips  Iantha Fallen L. Sun Kihn, PT, DPT, OCS Physical Therapist with [facility] 336 [phone] office  01/17/2023, 2:08 PM

## 2023-01-19 ENCOUNTER — Ambulatory Visit (HOSPITAL_COMMUNITY): Payer: Medicare Other

## 2023-01-19 DIAGNOSIS — M6281 Muscle weakness (generalized): Secondary | ICD-10-CM | POA: Diagnosis not present

## 2023-01-19 DIAGNOSIS — M25561 Pain in right knee: Secondary | ICD-10-CM | POA: Diagnosis not present

## 2023-01-19 DIAGNOSIS — M5459 Other low back pain: Secondary | ICD-10-CM

## 2023-01-19 DIAGNOSIS — R262 Difficulty in walking, not elsewhere classified: Secondary | ICD-10-CM

## 2023-01-19 NOTE — Therapy (Signed)
[facility] OUTPATIENT PHYS[facility]ERAPY TREATMENT    Patient Name: Tiffany Jacobs MRN: [id number] DOB:[birth year], 77 y.o., female Today's Date: 01/19/2023  END OF SESSION:   PT End of Session - 01/19/23 1409     Visit Number 9    Number of Visits 16    Date for PT Re-Evaluation 02/01/23    Authorization Type [employer]    Progress Note Due on Visit 10    PT Start Time 1350    PT Stop Time 1430    PT Time Calculation (min) 40 min    Equipment Utilized During Treatment Gait belt    Activity Tolerance Patient tolerated treatment well    Behavior During Therapy WFL for tasks assessed/performed              Past Medical History:  Diagnosis Date   Arthritis    Asthma 1963   Cataract    Chest pain with high risk for cardiac etiology 08/21/2017   Diabetes (HCC)    Diabetes mellitus, type 2 (HCC) 2004   GERD (gastroesophageal reflux disease) 2004   Hypertension 2004   Low back pain    Obesity    RAD (reactive airway disease)    Sciatica    Seasonal allergies    Past Surgical History:  Procedure Laterality Date   ACROMIO-CLAVICULAR JOINT REPAIR Right 11/09/2012   Procedure: ACROMIO-CLAVICULAR JOINT REPAIR;  Surgeon: Vickki Hearing, MD;  Location: [facility];  Service: Orthopedics;  Laterality: Right;   COLONOSCOPY N/A 10/10/2016   Procedure: COLONOSCOPY;  Surgeon: West Bali, MD;  Location: [facility];  Service: Endoscopy;  Laterality: N/A;  10:30   EYE SURGERY Right 05/09/2018   cataract   EYE SURGERY Left 2021   cataract   left breast biopsy for benign disease     SHOULDER ACROMIOPLASTY Right 11/09/2012   Procedure: RIGHT SHOULDER ACROMIOPLASTY;  Surgeon: Vickki Hearing, MD;  Location: [facility];  Service: Orthopedics;  Laterality: Right;   SHOULDER OPEN ROTATOR CUFF REPAIR Right 11/09/2012   Procedure: ROTATOR CUFF REPAIR Right SHOULDER OPEN;  Surgeon: Vickki Hearing, MD;  Location: [facility];  Service: Orthopedics;  Laterality: Right;   total knee  arthroplasty left  02/01/2005   Dr. Romeo Apple   TUBAL LIGATION  1970   Patient Active Problem List   Diagnosis Date Noted   ERRONEOUS ENCOUNTER--DISREGARD 01/11/2023   Chronic pain 10/20/2022   Hypertriglyceridemia 09/07/2022   H/O adenomatous polyp of colon 05/10/2022   Acute foreign body of ear canal, left, initial encounter 05/04/2022   Annual visit for general adult medical examination with abnormal findings 01/09/2022   Unsteady gait when walking 05/16/2021   Anxiety 05/11/2020   History of colonic polyps 03/11/2020   Chronic cough 12/18/2019   Urinary frequency 10/05/2019   Tubular adenoma of colon 12/04/2018   Nocturnal hypoxia 07/03/2014   Allergic rhinitis 03/20/2013   Itching of ear 02/17/2012   Mixed hyperlipidemia 11/20/2010   Cough variant asthma 11/27/2009   Obesity (BMI 30.0-34.9) 06/21/2009   Gastroesophageal reflux disease 06/21/2009   Primary hypertension 03/08/2009   SHOULDER, ARTHRITIS, DEGEN./OSTEO 08/28/2007   SCIATICA 06/28/2007   Type 2 diabetes mellitus with vascular disease (HCC) 11/28/2006    PCP: Kerri Perches, MDPCP - General   REFERRING PROVIDER:   Vickki Hearing, MD   REFERRING DIAG:  [phone] (ICD-10-CM) - Chronic left-sided low back pain with left-sided sciatica  M16.11 (ICD-10-CM) - Arthritis of right hip  M43.16 (ICD-10-CM) - Spondylolisthesis, lumbar region  M51.36 (ICD-10-CM) - DDD (degenerative disc disease), lumbar    Rationale for Evaluation and Treatment: Rehabilitation  THERAPY DIAG:  Difficulty in walking, not elsewhere classified  Muscle weakness (generalized)  Other low back pain  ONSET DATE: 7-8 months --------------------------------------------------------------------------------------------- SUBJECTIVE:                                                                                                                                                                                           SUBJECTIVE  STATEMENT: Patient doing well today and denies leg or back pain. Patient states that she has been her HEP everyday.  Eval: Patient states that her LBP began 7-8 months ago. Patient attempted to manage pain with PCP by changing medicine but it did not help. Patient was not having any improvements so she decided to see Dr. Romeo Apple 12/05/22. MD did XR of Lumbar/ Hip - see below. Pt referred to OPPT     PERTINENT HISTORY:  Right RTC Surgery 2014 Left TKR 2016   PAIN:  Are you having pain? None today   PRECAUTIONS: None   WEIGHT BEARING RESTRICTIONS: No  FALLS:  Has patient fallen in last 6 months? No   PATIENT GOALS: To move pain-free   NEXT MD VISIT: TBD   --------------------------------------------------------------------------------------------- OBJECTIVE: All findings are from initial evaluation unless otherwise dated  DIAGNOSTIC FINDINGS:  L4-5 spondylolisthesis and severe L3-4 disc space narrowing consistent with moderate to severe lumbar spondylosis  Impression mild arthritis of the right hip      PATIENT SURVEYS:  FOTO [phone]   SCREENING FOR RED FLAGS: Bowel or bladder incontinence: No Spinal tumors: No Cauda equina syndrome: No Compression fracture: No Abdominal aneurysm: No   COGNITION: Overall cognitive status: Within functional limits for tasks assessed   POSTURE: decreased lumbar lordosis, flexed trunk , and weight shift left                             FUNCTIONAL TESTS:  Timed up and go (TUG): Test next session      GAIT ANALYSIS: Distance walked: 10 feet  Assistive device utilized: Single point cane Level of assistance: CGA Comments: Patient with right UE single point cane which causes moderate antalgia onto right lower extremity; right lower extremity maintained in flexion through gait; moderate unsteadiness contact guard assist    SENSATION: WFL     LUMBAR ROM-seated:    AROM eval  Flexion WFL  Extension WFL   (Blank rows = not  tested; * = limited by pain)   LOWER EXTREMITY MMT:     Left ankle/hip/knee grossly 4-/5 except:  Right ankle/hip/knee grossly 3+/5 except:              Right Hip flexion: 3-/5             Right knee flexion: 3-/5             Right knee extension: 3-/5    LOWER EXTREMITY ROM:      Left ankle/hip/knee AROM grossly WFL  Right ankle/hip/knee AROM grossly WFL except:              Right knee flexion 0-100 pain             Right hip flexion 0-100 pain             Right knee extension: lacking 5 degrees    LUMBAR SPECIAL TESTS:  Straight leg raise test: Negative   PALPATION: Moderate tenderness to palpation right adductors --------------------------------------------------------------------------------------------- TODAY'S TREATMENT:                                                                                                                              DATE:  01/19/23 NuStep, level 2, seat 8, 5'  Seated hamstring stretch x 30" x 3 Seated, marches, neutral spine x 10 x 2 x 2 lbs Seated, alternating knee ext, neutral spine x 10 x 2 x 2 lbs Standing, abdominal isometrics with physioball, neutral spine x 3" x 10 x 2 Standing hip abd, flex, ext, GTB x 10 x 2 Standing heel-toe raises x 10 x 2  01/17/23 NuStep, level 1, seat 8, 5'  Post pelvic tilts x 3" x 10 x 2 Single knee-to-chest x 15" x 5 Double knee to chest with a physioball x 15" x 5 LTR x 1' Seated, abdominal isometrics with physioball, neutral spine x 3" x 10 x 2 Seated, marches, neutral spine x 3" x 10 x 2  01/12/23 Seated hamstring stretch x 30" x 3 R knee drives, 12" black stool x 30" x 3 Gastrocnemius slant board stretch x 30" x 3 Hip vector, RTB x 10 x 2 Post pelvic tilts x 3" x 10 x 2 Single knee-to-chest x 15" x 5 Double knee to chest with a physioball x 10 x 2  01/10/2023  -Standing hip abduction with RTB x 30 bilaterally -length of // bars Side stepping with GTB 2 x 6 -Sit/stands x 10 w/ GTB around knees  with 2K gram ball  -6in stepups with single UE support w/ hip hinge for gluteal activation x 10 -6in lateral stepups w/ BUE support x 5 bilaterally -Tandem stance on 6in box with foam pad for hip extension  activation. 2 x 30'RLE   01/03/23: -STS x10, RTB clam x15, ball squeeze x15, brace marching c RTB x30 (alternating) increased this date in some *sit break -STS x10, RTB clam x15, ball squeeze x15, brace marching c RTB x30 (alternating)  *sit break  -Seated bend to floor and rise to upright x12 -Seated seated trunk rotation  in chair, alternating sides x20  -Lateral side stepping at counter top, PRN hand support, cues for teenie tiny steps: 5 counter lengths bilat + RTB loop at knee level -Seated bend to floor and rise to upright x12 -Seated seated trunk rotation in chair, alternating sides x20 -Lateral side stepping at counter top, PRN hand support, cues for teenie tiny steps: 5 counter lengths bilat + RTB loop at knee level  -FOTO: 64 -Rt knee flexion 117 (stiffness without pain) -Lt knee flexion 120 (stiffness without pain)  12/29/22: -forward ball roll outs for lumbar flexion x20, then 5 with lateral devation *no visble ROM in lumbar spine due to hip joint hypomobility, pt denies feeling any mobility in spine durign this.   -STS x10, RTB clam x10, ball squeeze x10, brace marching c RTB x30 (alternating) *sit break -STS x10, RTB clam x10, ball squeeze x10, brace marching c RTB x30 (alternating) *sit break Lateral side stepping at counter top, PRN hand support, cues for teenie tiny steps: 5 counter lengths bilat *sit break Lateral side stepping at counter top, PRN hand support, cues for teenie tiny steps: 5 counter lengths bilat *sit break Lateral side stepping at counter top, PRN hand support, cues for teenie tiny steps: 5 counter lengths bilat *sit break    PATIENT EDUCATION:  Education details: HEP  Person educated: Patient Education method: Software engineer Education comprehension: verbalized understanding and returned demonstration  HOME EXERCISE PROGRAM: Access Code: [email or web address] URL: [email or web address] 01/[email or web address]  01/17/2023 - Seated March  - 1 x daily - 7 x weekly - 3 sets - 10 reps - Supine Lower Trunk Rotation  - 1 x daily - 7 x weekly  01/12/2023 - Hooklying Single Knee to Chest Stretch  - 1 x daily - 7 x weekly - 5 reps - 15 hold - Supine Posterior Pelvic Tilt  - 1 x daily - 7 x weekly - 2 sets - 10 reps - 3 hold  Date: 01/10/2023 Prepared by: Starling Manns  Exercises - Standing Alternating Knee Flexion  - 1 x daily - 7 x weekly - 3 sets - 10 reps - Standing Hip Abduction  - 1 x daily - 7 x weekly - 3 sets - 10 reps - Supine Bridge  - 1 x daily - 7 x weekly - 3 sets - 10 reps - Standing Partial Squat  - 1 x daily - 7 x weekly - 3 sets - 10 reps --------------------------------------------------------------------------------------------- ASSESSMENT:  CLINICAL IMPRESSION: Interventions today were geared towards core strengthening, LE strengthening and flexibility. Attempted to perform seated core stabilization exercises on a physioball but patient demonstrated apprehension so it was discontinued. Demonstrated mild levels of fatigue. SpO2 and HR on the exercises were 94% and 99 respectively. SpO2 and HR at the end of the session were 95% and 99 respectively.  Rest periods provided. Provided mild amount of cueing to ensure correct execution of activity with good carry-over. To date, skilled PT is required to address the impairments and improve function.    PERSONAL FACTORS: 1-2 comorbidities: see Pertinent History Above  are also affecting patient's functional outcome.   REHAB POTENTIAL: Good  CLINICAL DECISION MAKING: Stable/uncomplicated  EVALUATION COMPLEXITY: Moderate  --------------------------------------------------------------------------------------------- GOALS: Goals reviewed with  patient? No  SHORT TERM GOALS: Target date: 01/04/2023    Patient will be able to score a >/= 42 on the FOTO    to demonstrate an improvement in overall housework, ADL completion, mobility, and self-care. Baseline: 01/03/23: FOTO 64 Goal status: INITIAL  2.  Patient will demonstrate right knee Flexion active range of motion to [facility] needed for home and community ambulation  Baseline:  01/03/23: ~120 degrees bilat, up from 100 degrees Goal status: INITIAL  3. Patient will be independent with a basic stretching/strengthening HEP with pain levels of 2-3/10   Baseline:  Goal status: INITIAL   LONG TERM GOALS: Target date: 02/01/2023   Patient will be able to score a >/= 42 on the FOTO  to demonstrate an improvement in overall housework, ADL completion, mobility, and self-care. Baseline: Baseline: 01/03/23: FOTO 64 Goal status: INITIAL  2.   Patient will be able to ambulate around the PT clinic using a left UE single point cane with a 2 point step through pattern (independently) to facilitate safe ambulation  Baseline: contact guard assist; moderate unsteadiness  Goal status: INITIAL  3.  Patient will be independent with a comprehensive strengthening HEP with pain levels of 1-2/10   Baseline:  Goal status: INITIAL  --------------------------------------------------------------------------------------------- PLAN:  PT FREQUENCY: 2x/week  PT DURATION: 8 weeks  PLANNED INTERVENTIONS: Therapeutic exercises, Therapeutic activity, Neuromuscular re-education, Balance training, Gait training, Patient/Family education, Self Care, Joint mobilization, Joint manipulation, Stair training, Dry Needling, Spinal manipulation, Spinal mobilization, Cryotherapy, Moist heat, and Manual therapy.  PLAN FOR NEXT SESSION:  cont core/leg strength especially those hips  Iantha Fallen L. Verlena Marlette, PT, DPT, OCS Physical Therapist with [facility] [phone]  office  01/19/2023, 2:38 PM

## 2023-01-20 ENCOUNTER — Other Ambulatory Visit: Payer: Self-pay | Admitting: Family Medicine

## 2023-01-23 ENCOUNTER — Other Ambulatory Visit: Payer: Self-pay

## 2023-01-23 MED ORDER — GLIPIZIDE ER 10 MG PO TB24: ORAL_TABLET | ORAL | 0 refills | Status: DC

## 2023-01-24 ENCOUNTER — Ambulatory Visit (HOSPITAL_COMMUNITY): Payer: Medicare Other

## 2023-01-24 ENCOUNTER — Encounter (HOSPITAL_COMMUNITY): Payer: Self-pay

## 2023-01-24 DIAGNOSIS — M5459 Other low back pain: Secondary | ICD-10-CM | POA: Diagnosis not present

## 2023-01-24 DIAGNOSIS — R262 Difficulty in walking, not elsewhere classified: Secondary | ICD-10-CM

## 2023-01-24 DIAGNOSIS — M6281 Muscle weakness (generalized): Secondary | ICD-10-CM

## 2023-01-24 DIAGNOSIS — M25561 Pain in right knee: Secondary | ICD-10-CM

## 2023-01-24 NOTE — Therapy (Signed)
[facility] OUTPATIENT PHYSICAL THERAPY TREATMENT  Progress Note Reporting Period 12/07/22 to 01/24/23  See note below for Objective Data and Assessment of Progress/Goals.      Patient Name: Tiffany Jacobs MRN: [id number] DOB:Nov 13, 1945, 77 y.o., female Today's Date: 01/24/2023  END OF SESSION:   PT End of Session - 01/24/23 1349     Visit Number 10    Number of Visits 16    Date for PT Re-Evaluation 02/01/23    Authorization Type [employer] Medicare    Progress Note Due on Visit 10    PT Start Time 1351    PT Stop Time 1430    PT Time Calculation (min) 39 min    Activity Tolerance Patient tolerated treatment well    Behavior During Therapy [facility] for tasks assessed/performed              Past Medical History:  Diagnosis Date   Arthritis    Asthma 1963   Cataract    Chest pain with high risk for cardiac etiology 08/21/2017   Diabetes (HCC)    Diabetes mellitus, type 2 (HCC) 2004   GERD (gastroesophageal reflux disease) 2004   Hypertension 2004   Low back pain    Obesity    RAD (reactive airway disease)    Sciatica    Seasonal allergies    Past Surgical History:  Procedure Laterality Date   ACROMIO-CLAVICULAR JOINT REPAIR Right 11/09/2012   Procedure: ACROMIO-CLAVICULAR JOINT REPAIR;  Surgeon: Vickki Hearing, MD;  Location: [facility];  Service: Orthopedics;  Laterality: Right;   COLONOSCOPY N/Tiffany 10/10/2016   Procedure: COLONOSCOPY;  Surgeon: West Bali, MD;  Location: [facility];  Service: Endoscopy;  Laterality: N/Tiffany;  10:30   EYE SURGERY Right 05/09/2018   cataract   EYE SURGERY Left 2021   cataract   left breast biopsy for benign disease     SHOULDER ACROMIOPLASTY Right 11/09/2012   Procedure: RIGHT SHOULDER ACROMIOPLASTY;  Surgeon: Vickki Hearing, MD;  Location: [facility];  Service: Orthopedics;  Laterality: Right;   SHOULDER OPEN ROTATOR CUFF REPAIR Right 11/09/2012   Procedure: ROTATOR CUFF REPAIR Right SHOULDER OPEN;  Surgeon: Vickki Hearing, MD;   Location: [facility];  Service: Orthopedics;  Laterality: Right;   total knee arthroplasty left  02/01/2005   Dr. Romeo Apple   TUBAL LIGATION  1970   Patient Active Problem List   Diagnosis Date Noted   ERRONEOUS ENCOUNTER--DISREGARD 01/11/2023   Chronic pain 10/20/2022   Hypertriglyceridemia 09/07/2022   H/O adenomatous polyp of colon 05/10/2022   Acute foreign body of ear canal, left, initial encounter 05/04/2022   Annual visit for general adult medical examination with abnormal findings 01/09/2022   Unsteady gait when walking 05/16/2021   Anxiety 05/11/2020   History of colonic polyps 03/11/2020   Chronic cough 12/18/2019   Urinary frequency 10/05/2019   Tubular adenoma of colon 12/04/2018   Nocturnal hypoxia 07/03/2014   Allergic rhinitis 03/20/2013   Itching of ear 02/17/2012   Mixed hyperlipidemia 11/20/2010   Cough variant asthma 11/27/2009   Obesity (BMI 30.0-34.9) 06/21/2009   Gastroesophageal reflux disease 06/21/2009   Primary hypertension 03/08/2009   SHOULDER, ARTHRITIS, DEGEN./OSTEO 08/28/2007   SCIATICA 06/28/2007   Type 2 diabetes mellitus with vascular disease (HCC) 11/28/2006    PCP: Kerri Perches, MDPCP - General   REFERRING PROVIDER:   Vickki Hearing, MD   REFERRING DIAG:  [phone] (ICD-10-CM) - Chronic left-sided low back pain with left-sided sciatica  M16.11 (ICD-10-CM) -  Arthritis of right hip  M43.16 (ICD-10-CM) - Spondylolisthesis, lumbar region  M51.36 (ICD-10-CM) - DDD (degenerative disc disease), lumbar    Rationale for Evaluation and Treatment: Rehabilitation  THERAPY DIAG:  Difficulty in walking, not elsewhere classified  Muscle weakness (generalized)  Other low back pain  Right knee pain, unspecified chronicity  ONSET DATE: 7-8 months --------------------------------------------------------------------------------------------- SUBJECTIVE:                                                                                                                                                                                            SUBJECTIVE STATEMENT: Pt reports she feels Tiffany lot of improvements since beginning therapy.  Stated she now walks without SPC with no LOB or recent falls.  No reports of pain currently.  Eval: Patient states that her LBP began 7-8 months ago. Patient attempted to manage pain with PCP by changing medicine but it did not help. Patient was not having any improvements so she decided to see Dr. Romeo Apple 12/05/22. MD did XR of Lumbar/ Hip - see below. Pt referred to OPPT     PERTINENT HISTORY:  Right RTC Surgery 2014 Left TKR 2016   PAIN:  Are you having pain? None today   PRECAUTIONS: None   WEIGHT BEARING RESTRICTIONS: No  FALLS:  Has patient fallen in last 6 months? No   PATIENT GOALS: To move pain-free   NEXT MD VISIT: TBD   --------------------------------------------------------------------------------------------- OBJECTIVE: All findings are from initial evaluation unless otherwise dated  DIAGNOSTIC FINDINGS:  L4-5 spondylolisthesis and severe L3-4 disc space narrowing consistent with moderate to severe lumbar spondylosis  Impression mild arthritis of the right hip      PATIENT SURVEYS:  FOTO 37.1500 01/24/23: 69.6%   SCREENING FOR RED FLAGS: Bowel or bladder incontinence: No Spinal tumors: No Cauda equina syndrome: No Compression fracture: No Abdominal aneurysm: No   COGNITION: Overall cognitive status: Within functional limits for tasks assessed   POSTURE: decreased lumbar lordosis, flexed trunk , and weight shift left                             FUNCTIONAL TESTS:  Timed up and go (TUG): Test next session  01/24/23:  10.12" no AD     GAIT ANALYSIS: Distance walked: 10 feet  Assistive device utilized: Single point cane Level of assistance: CGA Comments: Patient with right UE single point cane which causes moderate antalgia onto right lower extremity;  right lower extremity maintained in flexion through gait; moderate unsteadiness contact guard assist    SENSATION: WFL     LUMBAR ROM-seated:  AROM eval  Flexion WFL  Extension WFL   (Blank rows = not tested; * = limited by pain)   LOWER EXTREMITY MMT:     Left ankle/hip/knee grossly 4-/5 except:  Right ankle/hip/knee grossly 3+/5 except:              Right Hip flexion: 3-/5             Right knee flexion: 3-/5             Right knee extension: 3-/5   01/24/23:  Hip extension Lt 4/5; Rt 4-/5  Hip abd Rt 4/5; Lt 4/5  Hip flexion Rt 4/5; Lt 4/5  Knee flexion Lt 4+/5 Rt 4-/5  Knee extension: Rt 4/5; Lt 3+/5  DF Rt 4/5; Lt 4-/5  PF Rt knee AROM 7-115 degrees     LOWER EXTREMITY ROM:      Left ankle/hip/knee AROM grossly WFL  Right ankle/hip/knee AROM grossly WFL except:              Right knee flexion 0-100 pain             Right hip flexion 0-100 pain             Right knee extension: lacking 5 degrees    LUMBAR SPECIAL TESTS:  Straight leg raise test: Negative   PALPATION: Moderate tenderness to palpation right adductors --------------------------------------------------------------------------------------------- TODAY'S TREATMENT:                                                                                                                              DATE:  01/24/23: 301 with no AD, carrying cane FOTO 69.6% MMT see above Rt knee AROM 7-115 Supine: Quad sets 10x 5" HEP  TUG 10.12" no AD   01/19/23 NuStep, level 2, seat 8, 5'  Seated hamstring stretch x 30" x 3 Seated, marches, neutral spine x 10 x 2 x 2 lbs Seated, alternating knee ext, neutral spine x 10 x 2 x 2 lbs Standing, abdominal isometrics with physioball, neutral spine x 3" x 10 x 2 Standing hip abd, flex, ext, GTB x 10 x 2 Standing heel-toe raises x 10 x 2  01/17/23 NuStep, level 1, seat 8, 5'  Post pelvic tilts x 3" x 10 x 2 Single knee-to-chest x 15" x 5 Double knee to chest  with Tiffany physioball x 15" x 5 LTR x 1' Seated, abdominal isometrics with physioball, neutral spine x 3" x 10 x 2 Seated, marches, neutral spine x 3" x 10 x 2  01/12/23 Seated hamstring stretch x 30" x 3 R knee drives, 12" black stool x 30" x 3 Gastrocnemius slant board stretch x 30" x 3 Hip vector, RTB x 10 x 2 Post pelvic tilts x 3" x 10 x 2 Single knee-to-chest x 15" x 5 Double knee to chest with Tiffany physioball x 10 x 2  01/10/2023  -Standing hip abduction with RTB x 30 bilaterally -  length of // bars Side stepping with GTB 2 x 6 -Sit/stands x 10 w/ GTB around knees with 2K gram ball  -6in stepups with single UE support w/ hip hinge for gluteal activation x 10 -6in lateral stepups w/ BUE support x 5 bilaterally -Tandem stance on 6in box with foam pad for hip extension  activation. 2 x 30'RLE   01/03/23: -STS x10, RTB clam x15, ball squeeze x15, brace marching c RTB x30 (alternating) increased this date in some *sit break -STS x10, RTB clam x15, ball squeeze x15, brace marching c RTB x30 (alternating)  *sit break  -Seated bend to floor and rise to upright x12 -Seated seated trunk rotation in chair, alternating sides x20  -Lateral side stepping at counter top, PRN hand support, cues for teenie tiny steps: 5 counter lengths bilat + RTB loop at knee level -Seated bend to floor and rise to upright x12 -Seated seated trunk rotation in chair, alternating sides x20 -Lateral side stepping at counter top, PRN hand support, cues for teenie tiny steps: 5 counter lengths bilat + RTB loop at knee level  -FOTO: 64 -Rt knee flexion 117 (stiffness without pain) -Lt knee flexion 120 (stiffness without pain)  12/29/22: -forward ball roll outs for lumbar flexion x20, then 5 with lateral devation *no visble ROM in lumbar spine due to hip joint hypomobility, pt denies feeling any mobility in spine durign this.   -STS x10, RTB clam x10, ball squeeze x10, brace marching c RTB x30 (alternating) *sit  break -STS x10, RTB clam x10, ball squeeze x10, brace marching c RTB x30 (alternating) *sit break Lateral side stepping at counter top, PRN hand support, cues for teenie tiny steps: 5 counter lengths bilat *sit break Lateral side stepping at counter top, PRN hand support, cues for teenie tiny steps: 5 counter lengths bilat *sit break Lateral side stepping at counter top, PRN hand support, cues for teenie tiny steps: 5 counter lengths bilat *sit break    PATIENT EDUCATION:  Education details: HEP  Person educated: Patient Education method: Medical illustrator Education comprehension: verbalized understanding and returned demonstration  HOME EXERCISE PROGRAM: Access Code: [email or web address] URL: [email or web address] 01/[email or web address]  01/17/2023 - Seated March  - 1 x daily - 7 x weekly - 3 sets - 10 reps - Supine Lower Trunk Rotation  - 1 x daily - 7 x weekly  01/12/2023 - Hooklying Single Knee to Chest Stretch  - 1 x daily - 7 x weekly - 5 reps - 15 hold - Supine Posterior Pelvic Tilt  - 1 x daily - 7 x weekly - 2 sets - 10 reps - 3 hold  Date: 01/10/2023 Prepared by: Starling Manns  Exercises - Standing Alternating Knee Flexion  - 1 x daily - 7 x weekly - 3 sets - 10 reps - Standing Hip Abduction  - 1 x daily - 7 x weekly - 3 sets - 10 reps - Supine Bridge  - 1 x daily - 7 x weekly - 3 sets - 10 reps - Standing Partial Squat  - 1 x daily - 7 x weekly - 3 sets - 10 reps  01/24/23:  quad sets --------------------------------------------------------------------------------------------- ASSESSMENT:  CLINICAL IMPRESSION: Interventions today were geared towards core strengthening, LE strengthening and flexibility. Attempted to perform seated core stabilization exercises on Tiffany physioball but patient demonstrated apprehension so it was discontinued. Demonstrated mild levels of fatigue. SpO2 and HR on the exercises were 94% and 99 respectively. SpO2 and HR at the end of the  session were 95% and 99 respectively.  Rest periods provided. Provided mild amount of cueing to ensure correct execution of activity with good carry-over. To date, skilled PT is required to address the impairments and improve function.  10th visit progress note with the following findings:  Pt has met 2/3 STG and 2/3 LTGs.  Pt reports compliance with HEP daily.  Pt now ambulating without AD for short duration and uses SPC for longer distance and uneven terrain.  Presents with some improved strength though does continue to show some deficits especially in hip mm.  Pt limited AROM Rt knee, 7-115 degrees.  Pt educated on importance of knee extension, added quad sets to HEP.  Pt will continue to benefit from skilled intervention to address goals unmet.     PERSONAL FACTORS: 1-2 comorbidities: see Pertinent History Above  are also affecting patient's functional outcome.   REHAB POTENTIAL: Good  CLINICAL DECISION MAKING: Stable/uncomplicated  EVALUATION COMPLEXITY: Moderate  --------------------------------------------------------------------------------------------- GOALS: Goals reviewed with patient? No  SHORT TERM GOALS: Target date: 01/04/2023    Patient will be able to score Tiffany >/= 42 on the FOTO    to demonstrate an improvement in overall housework, ADL completion, mobility, and self-care. Baseline: 01/03/23: FOTO 64; 01/24/23:  FOTO 69.6% Goal status: MET  2.  Patient will demonstrate right knee Flexion active range of motion to [facility] needed for home and community ambulation  Baseline:  01/03/23: ~120 degrees bilat, up from 100 degrees; 01/24/23:  7-115 degrees Goal status: In Progress  3. Patient will be independent with Tiffany basic stretching/strengthening HEP with pain levels of 2-3/10   Baseline: 01/24/23:  Reports compliance with advanced HEP Goal status: MET   LONG TERM GOALS: Target date: 02/01/2023   Patient will be able to score Tiffany >/= 42 on the FOTO  to demonstrate an  improvement in overall housework, ADL completion, mobility, and self-care. Baseline: Baseline: 01/03/23: FOTO 64; 01/24/23: FOTO 69.6% Goal status: MET  2.   Patient will be able to ambulate around the PT clinic using Tiffany left UE single point cane with Tiffany 2 point step through pattern (independently) to facilitate safe ambulation  Baseline: contact guard assist; moderate unsteadiness; 01/24/23: : 301 ft with no AD, carrying cane Goal status: MET  3.  Patient will be independent with Tiffany comprehensive strengthening HEP with pain levels of 1-2/10   Baseline:  Goal status: Ongoing  --------------------------------------------------------------------------------------------- PLAN:  PT FREQUENCY: 2x/week  PT DURATION: 8 weeks  PLANNED INTERVENTIONS: Therapeutic exercises, Therapeutic activity, Neuromuscular re-education, Balance training, Gait training, Patient/Family education, Self Care, Joint mobilization, Joint manipulation, Stair training, Dry Needling, Spinal manipulation, Spinal mobilization, Cryotherapy, Moist heat, and Manual therapy.  PLAN FOR NEXT SESSION:  cont core/leg strength especially those hips.  Add TKE, bike for mobility, sidestep, STS, squats...  Becky Sax, LPTA/CLT; CBIS [phone]  Juel Burrow, PTA 01/24/2023, 4:30 PM  01/24/2023, 4:30 PM

## 2023-01-25 NOTE — Therapy (Signed)
[facility] OUT[facility]IENT PHYS[facility]ERAPY TREATMENT  Progress Note Reporting Period 12/07/22 to 01/24/23  See note below for Objective Data and Assessment of Progress/Goals.      Patient Name: Tiffany Jacobs MRN: [id number] DOB:[birth year], 77 y.o., female Today's Date: 01/25/2023  END OF SESSION:      Past Medical History:  Diagnosis Date   Arthritis    Asthma 1963   Cataract    Chest pain with high risk for cardiac etiology 08/21/2017   Diabetes (HCC)    Diabetes mellitus, type 2 (HCC) 2004   GERD (gastroesophageal reflux disease) 2004   Hypertension 2004   Low back pain    Obesity    RAD (reactive airway disease)    Sciatica    Seasonal allergies    Past Surgical History:  Procedure Laterality Date   ACROMIO-CLAVICULAR JOINT REPAIR Right 11/09/2012   Procedure: ACROMIO-CLAVICULAR JOINT REPAIR;  Surgeon: Vickki Hearing, MD;  Location: [facility];  Service: Orthopedics;  Laterality: Right;   COLONOSCOPY N/A 10/10/2016   Procedure: COLONOSCOPY;  Surgeon: West Bali, MD;  Location: [facility];  Service: Endoscopy;  Laterality: N/A;  10:30   EYE SURGERY Right 05/09/2018   cataract   EYE SURGERY Left 2021   cataract   left breast biopsy for benign disease     SHOULDER ACROMIOPLASTY Right 11/09/2012   Procedure: RIGHT SHOULDER ACROMIOPLASTY;  Surgeon: Vickki Hearing, MD;  Location: [facility];  Service: Orthopedics;  Laterality: Right;   SHOULDER OPEN ROTATOR CUFF REPAIR Right 11/09/2012   Procedure: ROTATOR CUFF REPAIR Right SHOULDER OPEN;  Surgeon: Vickki Hearing, MD;  Location: [facility];  Service: Orthopedics;  Laterality: Right;   total knee arthroplasty left  02/01/2005   Dr. Romeo Apple   TUBAL LIGATION  1970   Patient Active Problem List   Diagnosis Date Noted   ERRONEOUS ENCOUNTER--DISREGARD 01/11/2023   Chronic pain 10/20/2022   Hypertriglyceridemia 09/07/2022   H/O adenomatous polyp of colon 05/10/2022   Acute foreign body of ear canal, left, initial  encounter 05/04/2022   Annual visit for general adult medical examination with abnormal findings 01/09/2022   Unsteady gait when walking 05/16/2021   Anxiety 05/11/2020   History of colonic polyps 03/11/2020   Chronic cough 12/18/2019   Urinary frequency 10/05/2019   Tubular adenoma of colon 12/04/2018   Nocturnal hypoxia 07/03/2014   Allergic rhinitis 03/20/2013   Itching of ear 02/17/2012   Mixed hyperlipidemia 11/20/2010   Cough variant asthma 11/27/2009   Obesity (BMI 30.0-34.9) 06/21/2009   Gastroesophageal reflux disease 06/21/2009   Primary hypertension 03/08/2009   SHOULDER, ARTHRITIS, DEGEN./OSTEO 08/28/2007   SCIATICA 06/28/2007   Type 2 diabetes mellitus with vascular disease (HCC) 11/28/2006    PCP: Kerri Perches, MDPCP - General   REFERRING PROVIDER:   Vickki Hearing, MD   REFERRING DIAG:  [phone] (ICD-10-CM) - Chronic left-sided low back pain with left-sided sciatica  M16.11 (ICD-10-CM) - Arthritis of right hip  M43.16 (ICD-10-CM) - Spondylolisthesis, lumbar region  M51.36 (ICD-10-CM) - DDD (degenerative disc disease), lumbar    Rationale for Evaluation and Treatment: Rehabilitation  THERAPY DIAG:  Difficulty in walking, not elsewhere classified  Muscle weakness (generalized)  Other low back pain  Right knee pain, unspecified chronicity  ONSET DATE: 7-8 months --------------------------------------------------------------------------------------------- SUBJECTIVE:  SUBJECTIVE STATEMENT: *** Pt reports she feels a lot of improvements since beginning therapy.  Stated she now walks without SPC with no LOB or recent falls.  No reports of pain currently.  Eval: Patient states that her LBP began 7-8 months ago. Patient attempted to manage pain with PCP by  changing medicine but it did not help. Patient was not having any improvements so she decided to see Dr. Romeo Apple 12/05/22. MD did XR of Lumbar/ Hip - see below. Pt referred to OPPT     PERTINENT HISTORY:  Right RTC Surgery 2014 Left TKR 2016   PAIN:  Are you having pain? None today   PRECAUTIONS: None   WEIGHT BEARING RESTRICTIONS: No  FALLS:  Has patient fallen in last 6 months? No   PATIENT GOALS: To move pain-free   NEXT MD VISIT: TBD   --------------------------------------------------------------------------------------------- OBJECTIVE: All findings are from initial evaluation unless otherwise dated  DIAGNOSTIC FINDINGS:  L4-5 spondylolisthesis and severe L3-4 disc space narrowing consistent with moderate to severe lumbar spondylosis  Impression mild arthritis of the right hip      PATIENT SURVEYS:  FOTO 37.1500 01/24/23: 69.6%   SCREENING FOR RED FLAGS: Bowel or bladder incontinence: No Spinal tumors: No Cauda equina syndrome: No Compression fracture: No Abdominal aneurysm: No   COGNITION: Overall cognitive status: Within functional limits for tasks assessed   POSTURE: decreased lumbar lordosis, flexed trunk , and weight shift left                             FUNCTIONAL TESTS:  Timed up and go (TUG): Test next session  01/24/23:  10.12" no AD     GAIT ANALYSIS: Distance walked: 10 feet  Assistive device utilized: Single point cane Level of assistance: CGA Comments: Patient with right UE single point cane which causes moderate antalgia onto right lower extremity; right lower extremity maintained in flexion through gait; moderate unsteadiness contact guard assist    SENSATION: WFL     LUMBAR ROM-seated:    AROM eval  Flexion WFL  Extension WFL   (Blank rows = not tested; * = limited by pain)   LOWER EXTREMITY MMT:     Left ankle/hip/knee grossly 4-/5 except:  Right ankle/hip/knee grossly 3+/5 except:              Right Hip flexion: 3-/5              Right knee flexion: 3-/5             Right knee extension: 3-/5   01/24/23:  Hip extension Lt 4/5; Rt 4-/5  Hip abd Rt 4/5; Lt 4/5  Hip flexion Rt 4/5; Lt 4/5  Knee flexion Lt 4+/5 Rt 4-/5  Knee extension: Rt 4/5; Lt 3+/5  DF Rt 4/5; Lt 4-/5  PF Rt knee AROM 7-115 degrees     LOWER EXTREMITY ROM:      Left ankle/hip/knee AROM grossly WFL  Right ankle/hip/knee AROM grossly WFL except:              Right knee flexion 0-100 pain             Right hip flexion 0-100 pain             Right knee extension: lacking 5 degrees    LUMBAR SPECIAL TESTS:  Straight leg raise test: Negative   PALPATION: Moderate tenderness to palpation right adductors --------------------------------------------------------------------------------------------- TODAY'S TREATMENT:  DATE:  01/26/23:  ***  01/24/23: 301 with no AD, carrying cane FOTO 69.6% MMT see above Rt knee AROM 7-115 Supine: Quad sets 10x 5" HEP  TUG 10.12" no AD   01/19/23 NuStep, level 2, seat 8, 5'  Seated hamstring stretch x 30" x 3 Seated, marches, neutral spine x 10 x 2 x 2 lbs Seated, alternating knee ext, neutral spine x 10 x 2 x 2 lbs Standing, abdominal isometrics with physioball, neutral spine x 3" x 10 x 2 Standing hip abd, flex, ext, GTB x 10 x 2 Standing heel-toe raises x 10 x 2  01/17/23 NuStep, level 1, seat 8, 5'  Post pelvic tilts x 3" x 10 x 2 Single knee-to-chest x 15" x 5 Double knee to chest with a physioball x 15" x 5 LTR x 1' Seated, abdominal isometrics with physioball, neutral spine x 3" x 10 x 2 Seated, marches, neutral spine x 3" x 10 x 2  01/12/23 Seated hamstring stretch x 30" x 3 R knee drives, 12" black stool x 30" x 3 Gastrocnemius slant board stretch x 30" x 3 Hip vector, RTB x 10 x 2 Post pelvic tilts x 3" x 10 x 2 Single knee-to-chest x 15" x  5 Double knee to chest with a physioball x 10 x 2  01/10/2023  -Standing hip abduction with RTB x 30 bilaterally -length of // bars Side stepping with GTB 2 x 6 -Sit/stands x 10 w/ GTB around knees with 2K gram ball  -6in stepups with single UE support w/ hip hinge for gluteal activation x 10 -6in lateral stepups w/ BUE support x 5 bilaterally -Tandem stance on 6in box with foam pad for hip extension  activation. 2 x 30'RLE   01/03/23: -STS x10, RTB clam x15, ball squeeze x15, brace marching c RTB x30 (alternating) increased this date in some *sit break -STS x10, RTB clam x15, ball squeeze x15, brace marching c RTB x30 (alternating)  *sit break  -Seated bend to floor and rise to upright x12 -Seated seated trunk rotation in chair, alternating sides x20  -Lateral side stepping at counter top, PRN hand support, cues for teenie tiny steps: 5 counter lengths bilat + RTB loop at knee level -Seated bend to floor and rise to upright x12 -Seated seated trunk rotation in chair, alternating sides x20 -Lateral side stepping at counter top, PRN hand support, cues for teenie tiny steps: 5 counter lengths bilat + RTB loop at knee level  -FOTO: 64 -Rt knee flexion 117 (stiffness without pain) -Lt knee flexion 120 (stiffness without pain)  12/29/22: -forward ball roll outs for lumbar flexion x20, then 5 with lateral devation *no visble ROM in lumbar spine due to hip joint hypomobility, pt denies feeling any mobility in spine durign this.   -STS x10, RTB clam x10, ball squeeze x10, brace marching c RTB x30 (alternating) *sit break -STS x10, RTB clam x10, ball squeeze x10, brace marching c RTB x30 (alternating) *sit break Lateral side stepping at counter top, PRN hand support, cues for teenie tiny steps: 5 counter lengths bilat *sit break Lateral side stepping at counter top, PRN hand support, cues for teenie tiny steps: 5 counter lengths bilat *sit break Lateral side stepping at counter top,  PRN hand support, cues for teenie tiny steps: 5 counter lengths bilat *sit break    PATIENT EDUCATION:  Education details: HEP  Person educated: Patient Education method: Medical illustrator Education comprehension: verbalized understanding and returned demonstration  HOME EXERCISE PROGRAM: Access Code: [email or web address] URL: [email or web address] 01/[email or web address]  01/17/2023 - Seated March  - 1 x daily - 7 x weekly - 3 sets - 10 reps - Supine Lower Trunk Rotation  - 1 x daily - 7 x weekly  01/12/2023 - Hooklying Single Knee to Chest Stretch  - 1 x daily - 7 x weekly - 5 reps - 15 hold - Supine Posterior Pelvic Tilt  - 1 x daily - 7 x weekly - 2 sets - 10 reps - 3 hold  Date: 01/10/2023 Prepared by: Starling Manns  Exercises - Standing Alternating Knee Flexion  - 1 x daily - 7 x weekly - 3 sets - 10 reps - Standing Hip Abduction  - 1 x daily - 7 x weekly - 3 sets - 10 reps - Supine Bridge  - 1 x daily - 7 x weekly - 3 sets - 10 reps - Standing Partial Squat  - 1 x daily - 7 x weekly - 3 sets - 10 reps  01/24/23:  quad sets --------------------------------------------------------------------------------------------- ASSESSMENT:  CLINICAL IMPRESSION: ***  Interventions today were geared towards core strengthening, LE strengthening and flexibility. Attempted to perform seated core stabilization exercises on a physioball but patient demonstrated apprehension so it was discontinued. Demonstrated mild levels of fatigue. SpO2 and HR on the exercises were 94% and 99 respectively. SpO2 and HR at the end of the session were 95% and 99 respectively.  Rest periods provided. Provided mild amount of cueing to ensure correct execution of activity with good carry-over. To date, skilled PT is required to address the impairments and improve function.  10th visit progress note with the following findings:  Pt has met 2/3 STG and 2/3 LTGs.  Pt reports compliance with HEP daily.  Pt now  ambulating without AD for short duration and uses SPC for longer distance and uneven terrain.  Presents with some improved strength though does continue to show some deficits especially in hip mm.  Pt limited AROM Rt knee, 7-115 degrees.  Pt educated on importance of knee extension, added quad sets to HEP.  Pt will continue to benefit from skilled intervention to address goals unmet.     PERSONAL FACTORS: 1-2 comorbidities: see Pertinent History Above  are also affecting patient's functional outcome.   REHAB POTENTIAL: Good  CLINICAL DECISION MAKING: Stable/uncomplicated  EVALUATION COMPLEXITY: Moderate  --------------------------------------------------------------------------------------------- GOALS: Goals reviewed with patient? No  SHORT TERM GOALS: Target date: 01/04/2023    Patient will be able to score a >/= 42 on the FOTO    to demonstrate an improvement in overall housework, ADL completion, mobility, and self-care. Baseline: 01/03/23: FOTO 64; 01/24/23:  FOTO 69.6% Goal status: MET  2.  Patient will demonstrate right knee Flexion active range of motion to [facility] needed for home and community ambulation  Baseline:  01/03/23: ~120 degrees bilat, up from 100 degrees; 01/24/23:  7-115 degrees Goal status: In Progress  3. Patient will be independent with a basic stretching/strengthening HEP with pain levels of 2-3/10   Baseline: 01/24/23:  Reports compliance with advanced HEP Goal status: MET   LONG TERM GOALS: Target date: 02/01/2023  --02/17/23    Patient will be able to score a >/= 42 on the FOTO  to demonstrate an improvement in overall housework, ADL completion, mobility, and self-care. Baseline: Baseline: 01/03/23: FOTO 64; 01/24/23: FOTO 69.6% Goal status: MET  2.   Patient will be able to ambulate around the PT clinic using a left UE single point cane  with a 2 point step through pattern (independently) to facilitate safe ambulation  Baseline: contact guard assist;  moderate unsteadiness; 01/24/23: : 301 ft with no AD, carrying cane Goal status: MET  3.  Patient will be independent with a comprehensive strengthening HEP with pain levels of 1-2/10   Baseline:  Goal status: Ongoing  --------------------------------------------------------------------------------------------- PLAN:  PT FREQUENCY: 2x/week  PT DURATION: 3 weeks  PLANNED INTERVENTIONS: 97110-Therapeutic exercises, 97530- Therapeutic activity, 97112- Neuromuscular re-education, 97535- Self Care, 16109- Manual therapy, [phone]- Gait training, Patient/Family education, Balance training, Stair training, Dry Needling, Joint mobilization, Joint manipulation, Spinal manipulation, Spinal mobilization, Cryotherapy, and Moist heat.  PLAN FOR NEXT SESSION:  cont core/leg strength especially those hips.  Add TKE, bike for mobility, sidestep, STS, squats...    Viviann Spare, PT, DPT  01/25/2023, 3:11 PM  01/25/2023, 3:11 PM

## 2023-01-25 NOTE — Addendum Note (Signed)
Addended by: Seymour Bars on: 01/25/2023 08:15 AM   Modules accepted: Orders

## 2023-01-26 ENCOUNTER — Encounter (HOSPITAL_COMMUNITY): Payer: Self-pay

## 2023-01-26 ENCOUNTER — Ambulatory Visit (HOSPITAL_COMMUNITY): Payer: Medicare Other

## 2023-01-26 DIAGNOSIS — M25561 Pain in right knee: Secondary | ICD-10-CM | POA: Diagnosis not present

## 2023-01-26 DIAGNOSIS — M5459 Other low back pain: Secondary | ICD-10-CM

## 2023-01-26 DIAGNOSIS — R262 Difficulty in walking, not elsewhere classified: Secondary | ICD-10-CM | POA: Diagnosis not present

## 2023-01-26 DIAGNOSIS — M6281 Muscle weakness (generalized): Secondary | ICD-10-CM | POA: Diagnosis not present

## 2023-01-31 ENCOUNTER — Encounter (HOSPITAL_COMMUNITY): Payer: Self-pay

## 2023-01-31 ENCOUNTER — Ambulatory Visit (HOSPITAL_COMMUNITY): Payer: Medicare Other

## 2023-01-31 DIAGNOSIS — M25561 Pain in right knee: Secondary | ICD-10-CM | POA: Diagnosis not present

## 2023-01-31 DIAGNOSIS — M5459 Other low back pain: Secondary | ICD-10-CM

## 2023-01-31 DIAGNOSIS — R262 Difficulty in walking, not elsewhere classified: Secondary | ICD-10-CM | POA: Diagnosis not present

## 2023-01-31 DIAGNOSIS — M6281 Muscle weakness (generalized): Secondary | ICD-10-CM | POA: Diagnosis not present

## 2023-01-31 NOTE — Therapy (Signed)
[facility] OUTPATIENT PHYSICAL THERAPY TREATMENT & DISCHARGE  Patient Name: Tiffany Jacobs MRN: [id number] DOB:[birth year], 77 y.o., female Today's Date: 01/31/2023  END OF SESSION:   PT End of Session - 01/31/23 1514     Visit Number 12    Number of Visits 16    Date for PT Re-Evaluation 02/01/23    Authorization Type [employer] Medicare    Progress Note Due on Visit 10    PT Start Time 1515    PT Stop Time 1535    PT Time Calculation (min) 20 min    Activity Tolerance Patient tolerated treatment well    Behavior During Therapy [facility] for tasks assessed/performed                Past Medical History:  Diagnosis Date   Arthritis    Asthma 1963   Cataract    Chest pain with high risk for cardiac etiology 08/21/2017   Diabetes (HCC)    Diabetes mellitus, type 2 (HCC) 2004   GERD (gastroesophageal reflux disease) 2004   Hypertension 2004   Low back pain    Obesity    RAD (reactive airway disease)    Sciatica    Seasonal allergies    Past Surgical History:  Procedure Laterality Date   ACROMIO-CLAVICULAR JOINT REPAIR Right 11/09/2012   Procedure: ACROMIO-CLAVICULAR JOINT REPAIR;  Surgeon: Vickki Hearing, MD;  Location: [facility];  Service: Orthopedics;  Laterality: Right;   COLONOSCOPY N/A 10/10/2016   Procedure: COLONOSCOPY;  Surgeon: West Bali, MD;  Location: [facility];  Service: Endoscopy;  Laterality: N/A;  10:30   EYE SURGERY Right 05/09/2018   cataract   EYE SURGERY Left 2021   cataract   left breast biopsy for benign disease     SHOULDER ACROMIOPLASTY Right 11/09/2012   Procedure: RIGHT SHOULDER ACROMIOPLASTY;  Surgeon: Vickki Hearing, MD;  Location: [facility];  Service: Orthopedics;  Laterality: Right;   SHOULDER OPEN ROTATOR CUFF REPAIR Right 11/09/2012   Procedure: ROTATOR CUFF REPAIR Right SHOULDER OPEN;  Surgeon: Vickki Hearing, MD;  Location: [facility];  Service: Orthopedics;  Laterality: Right;   total knee arthroplasty left  02/01/2005   Dr. Romeo Apple    TUBAL LIGATION  1970   Patient Active Problem List   Diagnosis Date Noted   ERRONEOUS ENCOUNTER--DISREGARD 01/11/2023   Chronic pain 10/20/2022   Hypertriglyceridemia 09/07/2022   H/O adenomatous polyp of colon 05/10/2022   Acute foreign body of ear canal, left, initial encounter 05/04/2022   Annual visit for general adult medical examination with abnormal findings 01/09/2022   Unsteady gait when walking 05/16/2021   Anxiety 05/11/2020   History of colonic polyps 03/11/2020   Chronic cough 12/18/2019   Urinary frequency 10/05/2019   Tubular adenoma of colon 12/04/2018   Nocturnal hypoxia 07/03/2014   Allergic rhinitis 03/20/2013   Itching of ear 02/17/2012   Mixed hyperlipidemia 11/20/2010   Cough variant asthma 11/27/2009   Obesity (BMI 30.0-34.9) 06/21/2009   Gastroesophageal reflux disease 06/21/2009   Primary hypertension 03/08/2009   SHOULDER, ARTHRITIS, DEGEN./OSTEO 08/28/2007   SCIATICA 06/28/2007   Type 2 diabetes mellitus with vascular disease (HCC) 11/28/2006    PCP: Kerri Perches, MDPCP - General   REFERRING PROVIDER:   Vickki Hearing, MD   REFERRING DIAG:  [phone] (ICD-10-CM) - Chronic left-sided low back pain with left-sided sciatica  M16.11 (ICD-10-CM) - Arthritis of right hip  M43.16 (ICD-10-CM) - Spondylolisthesis, lumbar region  M51.36 (ICD-10-CM) - DDD (degenerative disc  disease), lumbar    Rationale for Evaluation and Treatment: Rehabilitation  THERAPY DIAG:  Difficulty in walking, not elsewhere classified  Other low back pain  Muscle weakness (generalized)  ONSET DATE: 7-8 months --------------------------------------------------------------------------------------------- SUBJECTIVE:                                                                                                                                                                                           SUBJECTIVE STATEMENT:  Patient reports she is doing a  lot better. She is agreeable to discharge this date. She is planning to get a referral for her L shoulder to attend PT.   Eval: Patient states that her LBP began 7-8 months ago. Patient attempted to manage pain with PCP by changing medicine but it did not help. Patient was not having any improvements so she decided to see Dr. Romeo Apple 12/05/22. MD did XR of Lumbar/ Hip - see below. Pt referred to OPPT     PERTINENT HISTORY:  Right RTC Surgery 2014 Left TKR 2016   PAIN:  Are you having pain? None today   PRECAUTIONS: None   WEIGHT BEARING RESTRICTIONS: No  FALLS:  Has patient fallen in last 6 months? No   PATIENT GOALS: To move pain-free   NEXT MD VISIT: TBD   --------------------------------------------------------------------------------------------- OBJECTIVE: All findings are from initial evaluation unless otherwise dated  DIAGNOSTIC FINDINGS:  L4-5 spondylolisthesis and severe L3-4 disc space narrowing consistent with moderate to severe lumbar spondylosis  Impression mild arthritis of the right hip      PATIENT SURVEYS:  FOTO 37.1500 01/24/23: 69.6%   SCREENING FOR RED FLAGS: Bowel or bladder incontinence: No Spinal tumors: No Cauda equina syndrome: No Compression fracture: No Abdominal aneurysm: No   COGNITION: Overall cognitive status: Within functional limits for tasks assessed   POSTURE: decreased lumbar lordosis, flexed trunk , and weight shift left                             FUNCTIONAL TESTS:  Timed up and go (TUG): Test next session  01/24/23:  10.12" no AD     GAIT ANALYSIS: Distance walked: 10 feet  Assistive device utilized: Single point cane Level of assistance: CGA Comments: Patient with right UE single point cane which causes moderate antalgia onto right lower extremity; right lower extremity maintained in flexion through gait; moderate unsteadiness contact guard assist    SENSATION: WFL     LUMBAR ROM-seated:    AROM eval  Flexion  WFL  Extension WFL   (Blank rows = not tested; * = limited by pain)  LOWER EXTREMITY MMT:     Left ankle/hip/knee grossly 4-/5 except:  Right ankle/hip/knee grossly 3+/5 except:              Right Hip flexion: 3-/5             Right knee flexion: 3-/5             Right knee extension: 3-/5   01/24/23:  Hip extension Lt 4/5; Rt 4-/5  Hip abd Rt 4/5; Lt 4/5  Hip flexion Rt 4/5; Lt 4/5  Knee flexion Lt 4+/5 Rt 4-/5  Knee extension: Rt 4/5; Lt 3+/5  DF Rt 4/5; Lt 4-/5  PF Rt knee AROM 7-115 degrees     LOWER EXTREMITY ROM:      Left ankle/hip/knee AROM grossly WFL  Right ankle/hip/knee AROM grossly WFL except:              Right knee flexion 0-100 pain             Right hip flexion 0-100 pain             Right knee extension: lacking 5 degrees    LUMBAR SPECIAL TESTS:  Straight leg raise test: Negative   PALPATION: Moderate tenderness to palpation right adductors --------------------------------------------------------------------------------------------- TODAY'S TREATMENT:                                                                                                                              DATE:  01/31/23:  Nustep level 3-2 x 6 minutes, BLE only    HEP Given for maintenance at discharge:  Access Code: [facility] URL: [email or web address] Date: 01/31/2023 Prepared by: Maylon Peppers  Exercises - Mini Squat with Counter Support  - 2-3 x daily - 5-7 x weekly - 3 sets - 10 reps - Standing March with Counter Support  - 2-3 x daily - 5-7 x weekly - 3 sets - 10 reps - Standing Hip Abduction with Resistance at Ankles and Counter Support  - 2-3 x daily - 5-7 x weekly - 3 sets - 10 reps - Standing Hip Extension with Resistance at Ankles and Counter Support  - 2-3 x daily - 5-7 x weekly - 3 sets - 10 reps - Standing Hamstring Curl with Resistance  - 2-3 x daily - 5-7 x weekly - 3 sets - 10 reps - Side Stepping with Resistance at Ankles and Counter Support  -  2-3 x daily - 5-7 x weekly - 3 sets - 10 reps - Heel Toe Raises with Counter Support  - 2-3 x daily - 5-7 x weekly - 3 sets - 10 reps - Seated March with Resistance  - 2-3 x daily - 5-7 x weekly - 3 sets - 10 reps - Seated Hip Abduction with Resistance  - 2-3 x daily - 5-7 x weekly - 3 sets - 10 reps - Seated Knee Extension with Resistance  - 2-3 x daily - 5-7 x weekly - 3  sets - 10 reps - Seated Flexion Stretch  - 2-3 x daily - 5-7 x weekly - 3 sets - 10 reps - Seated Trunk Rotation - Arms Crossed  - 2-3 x daily - 5-7 x weekly - 3 sets - 10 reps  01/26/23:  Nustep level 3-2 x 6 minutes  Standing marching with 3# AW 2 x 10 each LE  Standing hip abduction with 3# AW 2 x 10 each LE  Seated LAQ with 3# AW 2 x 10 each LE  Standing heel/toe raises with 3# AW 2 x 10 each LE  Sit to stand 2 x 10 with 2kg ball at chest  Standing hamstring curls with 3# AW 2 x 10 each LE Seated hip adduction ball squeeze 2 x 10 with 2-3 second hold  Seated hamstring stretch 3 x 30 seconds   01/24/23: 301 with no AD, carrying cane FOTO 69.6% MMT see above Rt knee AROM 7-115 Supine: Quad sets 10x 5" HEP  TUG 10.12" no AD   01/19/23 NuStep, level 2, seat 8, 5'  Seated hamstring stretch x 30" x 3 Seated, marches, neutral spine x 10 x 2 x 2 lbs Seated, alternating knee ext, neutral spine x 10 x 2 x 2 lbs Standing, abdominal isometrics with physioball, neutral spine x 3" x 10 x 2 Standing hip abd, flex, ext, GTB x 10 x 2 Standing heel-toe raises x 10 x 2  01/17/23 NuStep, level 1, seat 8, 5'  Post pelvic tilts x 3" x 10 x 2 Single knee-to-chest x 15" x 5 Double knee to chest with a physioball x 15" x 5 LTR x 1' Seated, abdominal isometrics with physioball, neutral spine x 3" x 10 x 2 Seated, marches, neutral spine x 3" x 10 x 2  01/12/23 Seated hamstring stretch x 30" x 3 R knee drives, 12" black stool x 30" x 3 Gastrocnemius slant board stretch x 30" x 3 Hip vector, RTB x 10 x 2 Post pelvic  tilts x 3" x 10 x 2 Single knee-to-chest x 15" x 5 Double knee to chest with a physioball x 10 x 2  01/10/2023  -Standing hip abduction with RTB x 30 bilaterally -length of // bars Side stepping with GTB 2 x 6 -Sit/stands x 10 w/ GTB around knees with 2K gram ball  -6in stepups with single UE support w/ hip hinge for gluteal activation x 10 -6in lateral stepups w/ BUE support x 5 bilaterally -Tandem stance on 6in box with foam pad for hip extension  activation. 2 x 30'RLE   01/03/23: -STS x10, RTB clam x15, ball squeeze x15, brace marching c RTB x30 (alternating) increased this date in some *sit break -STS x10, RTB clam x15, ball squeeze x15, brace marching c RTB x30 (alternating)  *sit break  -Seated bend to floor and rise to upright x12 -Seated seated trunk rotation in chair, alternating sides x20  -Lateral side stepping at counter top, PRN hand support, cues for teenie tiny steps: 5 counter lengths bilat + RTB loop at knee level -Seated bend to floor and rise to upright x12 -Seated seated trunk rotation in chair, alternating sides x20 -Lateral side stepping at counter top, PRN hand support, cues for teenie tiny steps: 5 counter lengths bilat + RTB loop at knee level  -FOTO: 64 -Rt knee flexion 117 (stiffness without pain) -Lt knee flexion 120 (stiffness without pain)  12/29/22: -forward ball roll outs for lumbar flexion x20, then 5 with lateral devation *no  visble ROM in lumbar spine due to hip joint hypomobility, pt denies feeling any mobility in spine durign this.   -STS x10, RTB clam x10, ball squeeze x10, brace marching c RTB x30 (alternating) *sit break -STS x10, RTB clam x10, ball squeeze x10, brace marching c RTB x30 (alternating) *sit break Lateral side stepping at counter top, PRN hand support, cues for teenie tiny steps: 5 counter lengths bilat *sit break Lateral side stepping at counter top, PRN hand support, cues for teenie tiny steps: 5 counter lengths  bilat *sit break Lateral side stepping at counter top, PRN hand support, cues for teenie tiny steps: 5 counter lengths bilat *sit break    PATIENT EDUCATION:  Education details: HEP  Person educated: Patient Education method: Medical illustrator Education comprehension: verbalized understanding and returned demonstration  HOME EXERCISE PROGRAM: Access Code: [email or web address] URL: [email or web address] 01/[email or web address]/23  01/17/2023 - Seated March  - 1 x daily - 7 x weekly - 3 sets - 10 reps - Supine Lower Trunk Rotation  - 1 x daily - 7 x weekly  01/12/2023 - Hooklying Single Knee to Chest Stretch  - 1 x daily - 7 x weekly - 5 reps - 15 hold - Supine Posterior Pelvic Tilt  - 1 x daily - 7 x weekly - 2 sets - 10 reps - 3 hold  Date: 01/10/2023 Prepared by: Starling Manns  Exercises - Standing Alternating Knee Flexion  - 1 x daily - 7 x weekly - 3 sets - 10 reps - Standing Hip Abduction  - 1 x daily - 7 x weekly - 3 sets - 10 reps - Supine Bridge  - 1 x daily - 7 x weekly - 3 sets - 10 reps - Standing Partial Squat  - 1 x daily - 7 x weekly - 3 sets - 10 reps  01/24/23:  quad sets  Access Code: [facility] URL: [email or web address] Date: 01/31/2023 Prepared by: Maylon Peppers  Exercises - Mini Squat with Counter Support  - 2-3 x daily - 5-7 x weekly - 3 sets - 10 reps - Standing March with Counter Support  - 2-3 x daily - 5-7 x weekly - 3 sets - 10 reps - Standing Hip Abduction with Resistance at Ankles and Counter Support  - 2-3 x daily - 5-7 x weekly - 3 sets - 10 reps - Standing Hip Extension with Resistance at Ankles and Counter Support  - 2-3 x daily - 5-7 x weekly - 3 sets - 10 reps - Standing Hamstring Curl with Resistance  - 2-3 x daily - 5-7 x weekly - 3 sets - 10 reps - Side Stepping with Resistance at Ankles and Counter Support  - 2-3 x daily - 5-7 x weekly - 3 sets - 10 reps - Heel Toe Raises with Counter Support  - 2-3 x daily - 5-7 x weekly  - 3 sets - 10 reps - Seated March with Resistance  - 2-3 x daily - 5-7 x weekly - 3 sets - 10 reps - Seated Hip Abduction with Resistance  - 2-3 x daily - 5-7 x weekly - 3 sets - 10 reps - Seated Knee Extension with Resistance  - 2-3 x daily - 5-7 x weekly - 3 sets - 10 reps - Seated Flexion Stretch  - 2-3 x daily - 5-7 x weekly - 3 sets - 10 reps - Seated Trunk Rotation - Arms Crossed  - 2-3 x daily - 5-7 x weekly - 3 sets - 10 reps ---------------------------------------------------------------------------------------------  ASSESSMENT:  CLINICAL IMPRESSION:     Patient has met 5/6 physical therapy goals except R knee ROM with patient only able to achieve 7-115 degrees, however R and L knee flexion ROM equal. Provided patient with comprehensive HEP focused on BLE strengthening with resistance. Patient agreeable to discharge this date. Patient has made significant progress towards goals while attending physical therapy and discharge is appropriate.      PERSONAL FACTORS: 1-2 comorbidities: see Pertinent History Above  are also affecting patient's functional outcome.   REHAB POTENTIAL: Good  CLINICAL DECISION MAKING: Stable/uncomplicated  EVALUATION COMPLEXITY: Moderate  --------------------------------------------------------------------------------------------- GOALS: Goals reviewed with patient? Yes  SHORT TERM GOALS: Target date: 01/04/2023    Patient will be able to score a >/= 42 on the FOTO    to demonstrate an improvement in overall housework, ADL completion, mobility, and self-care. Baseline: 01/03/23: FOTO 64; 01/24/23:  FOTO 69.6% Goal status: MET  2.  Patient will demonstrate right knee Flexion active range of motion to [facility] needed for home and community ambulation  Baseline:  01/03/23: ~120 degrees bilat, up from 100 degrees; 01/24/23:  7-115 degrees; 01/31/23: 7-115 degrees (flexion equal to L side) Goal status: Adequate for discharge   3. Patient will be independent  with a basic stretching/strengthening HEP with pain levels of 2-3/10   Baseline: 01/24/23:  Reports compliance with advanced HEP Goal status: MET   LONG TERM GOALS: Target date: 02/01/2023  --02/17/23    Patient will be able to score a >/= 42 on the FOTO  to demonstrate an improvement in overall housework, ADL completion, mobility, and self-care. Baseline: Baseline: 01/03/23: FOTO 64; 01/24/23: FOTO 69.6% Goal status: MET  2.   Patient will be able to ambulate around the PT clinic using a left UE single point cane with a 2 point step through pattern (independently) to facilitate safe ambulation  Baseline: contact guard assist; moderate unsteadiness; 01/24/23: : 301 ft with no AD, carrying cane Goal status: MET  3.  Patient will be independent with a comprehensive strengthening HEP with pain levels of 1-2/10   Baseline: 11/26: comprehensive strengthening HEP given  Goal status: MET   --------------------------------------------------------------------------------------------- PLAN:  PT FREQUENCY: 2x/week  PT DURATION: 3 weeks  PLANNED INTERVENTIONS: 97110-Therapeutic exercises, 97530- Therapeutic activity, 97112- Neuromuscular re-education, 97535- Self Care, 16109- Manual therapy, [phone]- Gait training, Patient/Family education, Balance training, Stair training, Dry Needling, Joint mobilization, Joint manipulation, Spinal manipulation, Spinal mobilization, Cryotherapy, and Moist heat.   Viviann Spare, PT, DPT  01/31/2023, 3:39 PM  01/31/2023, 3:39 PM

## 2023-02-19 DIAGNOSIS — G629 Polyneuropathy, unspecified: Secondary | ICD-10-CM | POA: Insufficient documentation

## 2023-02-20 ENCOUNTER — Other Ambulatory Visit (INDEPENDENT_AMBULATORY_CARE_PROVIDER_SITE_OTHER): Payer: Medicare Other

## 2023-02-20 ENCOUNTER — Ambulatory Visit: Payer: Medicare Other | Admitting: Orthopedic Surgery

## 2023-02-20 ENCOUNTER — Encounter: Payer: Self-pay | Admitting: Orthopedic Surgery

## 2023-02-20 VITALS — BP 164/79 | HR 92 | Ht 62.5 in | Wt 170.0 lb

## 2023-02-20 DIAGNOSIS — Z7689 Persons encountering health services in other specified circumstances: Secondary | ICD-10-CM | POA: Diagnosis not present

## 2023-02-20 DIAGNOSIS — M4722 Other spondylosis with radiculopathy, cervical region: Secondary | ICD-10-CM

## 2023-02-20 DIAGNOSIS — M542 Cervicalgia: Secondary | ICD-10-CM

## 2023-02-20 DIAGNOSIS — E1140 Type 2 diabetes mellitus with diabetic neuropathy, unspecified: Secondary | ICD-10-CM | POA: Diagnosis not present

## 2023-02-20 DIAGNOSIS — G8929 Other chronic pain: Secondary | ICD-10-CM

## 2023-02-20 DIAGNOSIS — F17200 Nicotine dependence, unspecified, uncomplicated: Secondary | ICD-10-CM | POA: Diagnosis not present

## 2023-02-20 DIAGNOSIS — Z79899 Other long term (current) drug therapy: Secondary | ICD-10-CM | POA: Diagnosis not present

## 2023-02-20 DIAGNOSIS — Z713 Dietary counseling and surveillance: Secondary | ICD-10-CM | POA: Diagnosis not present

## 2023-02-20 DIAGNOSIS — M19012 Primary osteoarthritis, left shoulder: Secondary | ICD-10-CM | POA: Diagnosis not present

## 2023-02-20 DIAGNOSIS — M19019 Primary osteoarthritis, unspecified shoulder: Secondary | ICD-10-CM | POA: Diagnosis not present

## 2023-02-20 DIAGNOSIS — E785 Hyperlipidemia, unspecified: Secondary | ICD-10-CM | POA: Diagnosis not present

## 2023-02-20 DIAGNOSIS — I10 Essential (primary) hypertension: Secondary | ICD-10-CM | POA: Diagnosis not present

## 2023-02-20 DIAGNOSIS — K219 Gastro-esophageal reflux disease without esophagitis: Secondary | ICD-10-CM | POA: Diagnosis not present

## 2023-02-20 DIAGNOSIS — E1165 Type 2 diabetes mellitus with hyperglycemia: Secondary | ICD-10-CM | POA: Diagnosis not present

## 2023-02-20 MED ORDER — MELOXICAM 7.5 MG PO TABS: 7.5000 mg | ORAL_TABLET | Freq: Every day | ORAL | 5 refills | Status: DC

## 2023-02-20 MED ORDER — TIZANIDINE HCL 4 MG PO TABS: 4.0000 mg | ORAL_TABLET | Freq: Four times a day (QID) | ORAL | 0 refills | Status: DC | PRN

## 2023-02-20 NOTE — Patient Instructions (Signed)
Physical therapy has been ordered for you at [place]. They should call you to schedule, [phone] is the phone number to call, if you want to call to schedule.   

## 2023-02-20 NOTE — Progress Notes (Signed)
Follow-up recurrent problem left shoulder  Patient complains of pain chronic left shoulder pain with decreased range of motion and weakness especially with forward elevation  She also has pain running down her left arm associated with numbness and tingling although she denies neck pain  On examination she has weakness in abduction and flexion decreased range of motion in the flexion and the abduction planes as well as decreased external rotation with painful range of motion in that plane as well  She does not have neck pain or stiffness but does have some pain in her trapezius muscles and the scapular musculature  Imaging  C-spine moderate cervical spondylosis  Left shoulder moderate to severe osteoarthritis  Meds ordered this encounter  Medications   meloxicam (MOBIC) 7.5 MG tablet    Sig: Take 1 tablet (7.5 mg total) by mouth daily.    Dispense:  30 tablet    Refill:  5   tiZANidine (ZANAFLEX) 4 MG tablet    Sig: Take 1 tablet (4 mg total) by mouth every 6 (six) hours as needed for muscle spasms.    Dispense:  30 tablet    Refill:  0

## 2023-02-22 ENCOUNTER — Other Ambulatory Visit: Payer: Self-pay | Admitting: Family Medicine

## 2023-03-14 ENCOUNTER — Ambulatory Visit (HOSPITAL_COMMUNITY): Payer: Medicare Other | Attending: Orthopedic Surgery

## 2023-03-14 ENCOUNTER — Encounter (HOSPITAL_COMMUNITY): Payer: Self-pay

## 2023-03-14 ENCOUNTER — Other Ambulatory Visit: Payer: Self-pay | Admitting: Internal Medicine

## 2023-03-14 ENCOUNTER — Other Ambulatory Visit: Payer: Self-pay

## 2023-03-14 DIAGNOSIS — M542 Cervicalgia: Secondary | ICD-10-CM | POA: Insufficient documentation

## 2023-03-14 NOTE — Therapy (Signed)
 OUTPATIENT PHYSICAL THERAPY CERVICAL EVALUATION   Patient Name: Tiffany Jacobs MRN: [id number] DOB:[birth year], 78 y.o., female Today's Date: 03/14/2023  END OF SESSION:  Tiffany Jacobs End of Session - 03/14/23 1519     Visit Number 1    Authorization Type [employer] Medicare    Tiffany Jacobs Start Time 1356    Tiffany Jacobs Stop Time 1430    Tiffany Jacobs Time Calculation (min) 34 min    Behavior During Therapy [facility] for tasks assessed/performed             Past Medical History:  Diagnosis Date   Arthritis    Asthma 1963   Cataract    Chest pain with high risk for cardiac etiology 08/21/2017   Diabetes (HCC)    Diabetes mellitus, type 2 (HCC) 2004   GERD (gastroesophageal reflux disease) 2004   Hypertension 2004   Low back pain    Obesity    RAD (reactive airway disease)    Sciatica    Seasonal allergies    Past Surgical History:  Procedure Laterality Date   ACROMIO-CLAVICULAR JOINT REPAIR Right 11/09/2012   Procedure: ACROMIO-CLAVICULAR JOINT REPAIR;  Surgeon: Taft FORBES Minerva, MD;  Location: [facility];  Service: Orthopedics;  Laterality: Right;   COLONOSCOPY N/A 10/10/2016   Procedure: COLONOSCOPY;  Surgeon: Harvey Margo LITTIE, MD;  Location[facility];  Service: Endoscopy;  Laterality: N/A;  10:30   EYE SURGERY Right 05/09/2018   cataract   EYE SURGERY Left 2021   cataract   left breast biopsy for benign disease     SHOULDER ACROMIOPLASTY Right 11/09/2012   Procedure: RIGHT SHOULDER ACROMIOPLASTY;  Surgeon: Taft FORBES Minerva, MD;  Location: [facility];  Service: Orthopedics;  Laterality: Right;   SHOULDER OPEN ROTATOR CUFF REPAIR Right 11/09/2012   Procedure: ROTATOR CUFF REPAIR Right SHOULDER OPEN;  Surgeon: Taft FORBES Minerva, MD;  Location: [facility];  Service: Orthopedics;  Laterality: Right;   total knee arthroplasty left  02/01/2005   Dr. Minerva   TUBAL LIGATION  1970   Patient Active Problem List   Diagnosis Date Noted   ERRONEOUS ENCOUNTER--DISREGARD 01/11/2023   Chronic pain 10/20/2022    Hypertriglyceridemia 09/07/2022   H/O adenomatous polyp of colon 05/10/2022   Acute foreign body of ear canal, left, initial encounter 05/04/2022   Annual visit for general adult medical examination with abnormal findings 01/09/2022   Unsteady gait when walking 05/16/2021   Anxiety 05/11/2020   History of colonic polyps 03/11/2020   Chronic cough 12/18/2019   Urinary frequency 10/05/2019   Tubular adenoma of colon 12/04/2018   Nocturnal hypoxia 07/03/2014   Allergic rhinitis 03/20/2013   Itching of ear 02/17/2012   Mixed hyperlipidemia 11/20/2010   Cough variant asthma 11/27/2009   Obesity (BMI 30.0-34.9) 06/21/2009   Gastroesophageal reflux disease 06/21/2009   Primary hypertension 03/08/2009   SHOULDER, ARTHRITIS, DEGEN./OSTEO 08/28/2007   SCIATICA 06/28/2007   Type 2 diabetes mellitus with vascular disease (HCC) 11/28/2006    PCP: Antonetta Rollene FORBES MD  REFERRING PROVIDER: Minerva Taft FORBES, MD  REFERRING DIAG: M54.2 (ICD-10-CM) - Neck pain   THERAPY DIAG:  Neck pain  Rationale for Evaluation and Treatment: Rehabilitation  ONSET DATE: > 6 months  SUBJECTIVE:  SUBJECTIVE STATEMENT:  Tiffany Jacobs arriving 10 minutes late today. Tiffany Jacobs reporting Left chronic shoulder pain. Tiffany Jacobs doesn't think pain comes from neck but MD said its from her neck. Tiffany Jacobs reports concerns with intermittent but overall chronic left shoulder pain. Notices 10/10 pain at night when rolling over during sleep, but it is constantly in pain that is described as 'just painful.'  Patient does not numbness, tingling, or burning sensation down left upper extremity.  Pain is centralized in the left shoulder, with lateral and posterior aspects. Hand dominance: Right  PERTINENT HISTORY:  See above for details.   PAIN:  Are you  having pain?  No neck pain; no shoulder pain currently. At worst shoulder pain is 10/10 when turning on shoulder at night. 6-7/10 on average when hurtting.   PRECAUTIONS: None  RED FLAGS: None     WEIGHT BEARING RESTRICTIONS: No  FALLS:  Has patient fallen in last 6 months? No   PATIENT GOALS: Not established   OBJECTIVE:  Note: Objective measures were completed at Evaluation unless otherwise noted.  DIAGNOSTIC FINDINGS:  Narrative & Impression  Left shoulder narrowing of the joint space multiple osteophytes cyst in the head proximal migration of the humerus decreased humeral acromial distance consistent with glenohumeral arthritis severe    Cervical spine   Abnormal lordosis   C4-6 generative changes endplate irregularities and uncovertebral joint arthrosis   Cervical spondylosis  PATIENT SURVEYS:  FOTO not taken.  COGNITION: Overall cognitive status: Within functional limits for tasks assessed  SENSATION: WFL  POSTURE: rounded shoulders, forward head, and left scapular depression greater than RUE.   PALPATION: Tender to palpate along bicipital tendon, left middle deltoid and left infraspinatus, supraspinatus, and teres minor.   CERVICAL ROM:   Active ROM A/PROM (deg) eval  Flexion 100%  Extension 100%  Right lateral flexion 40%  Left lateral flexion 40%*  Right rotation 85%  Left rotation 50%*   (Blank rows = not tested) *Back of ear and distal down neck  UPPER EXTREMITY ROM:  Active ROM Right eval Left eval  Shoulder flexion 160 35  Shoulder extension    Shoulder abduction 140 40  Shoulder adduction    Shoulder extension    Shoulder internal rotation    Shoulder external rotation WFL 20  Elbow flexion    Elbow extension    Wrist flexion    Wrist extension    Wrist ulnar deviation    Wrist radial deviation    Wrist pronation    Wrist supination     (Blank rows = not tested)  UPPER EXTREMITY MMT:  MMT Right eval Left eval  Shoulder  flexion 4- 2  Shoulder extension    Shoulder abduction 4- 2  Shoulder adduction    Shoulder extension    Shoulder internal rotation    Shoulder external rotation 4- 2  Middle trapezius    Lower trapezius    Elbow flexion    Elbow extension    Wrist flexion    Wrist extension    Wrist ulnar deviation    Wrist radial deviation    Wrist pronation    Wrist supination    Grip strength     (Blank rows = not tested)  CERVICAL SPECIAL TESTS:  Upper limb tension test (ULTT): Negative, Spurling's test: Negative, and Distraction test: Negative patient painful with all testing, but not concordant pain.   Lateral Jobe: Positive  sulcus sign: Positive    TREATMENT DATE:   03/14/2023 Tiffany Jacobs Evaluation  PATIENT EDUCATION:  Education details: Tiffany Jacobs Evaluation, findings, prognosis, frequency, attendance policy, and HEP if given.  Person educated: Patient Education method: Explanation Education comprehension: verbalized understanding  HOME EXERCISE PROGRAM: Not established. One time visit.  ASSESSMENT:  CLINICAL IMPRESSION: Patient is a 78 y.o. female who was seen today for physical therapy evaluation and treatment for 54.2 (ICD-10-CM) - Neck pain.  The patient reports her main concern is her left shoulder pain and believes is not related to her neck.   Upon evaluation, patient demonstrating left scapular depression with apparent sulcus sign, painful to palpate along anterior lateral and posterior aspects of the left glenohumeral joint.  Patient's active range of motion in left glenohumeral joint demonstrates limitations in capsular pattern and similar to presentation of mild frozen shoulder with limitations noted significantly in flexion, abduction and external rotation.  Patient's reported pain does not seem to correlate with suspected cervical spine neuropathic pain.   Patient with adequate C-spine ROM but was painful throughout cervical testing, especially to left rotation and side bending but no concordant pain or symptoms reported upon testing.  Patient's left shoulder range of motion, MMT findings, painful location and lack of centralization/peripheralization, and special tests of C-spine and glenohumeral joint lead this therapist to suspect that patient's major complaint and functional limitations are related to with similarities and shoulder pathology greater than neck involvement. For this reason, and at patient's desire and request, would prefer to focus on occupational therapy to address shoulder deficits and functional limitations before starting skilled Tiffany Jacobs services to address further potential neck involvement.  Based on this we will hold further physical therapy treatment services at this time, so Tiffany Jacobs may focus on OT and shoulder concerns first.   CLINICAL DECISION MAKING: Evolving/moderate complexity  EVALUATION COMPLEXITY: Moderate   GOALS: Goals reviewed with patient? Yes  SHORT TERM GOALS: Target date: 03/14/2023  Tiffany Jacobs will be in agreement with evaluating physical therapist regarding physical therapy POC.  Baseline: Goal status: MET   PLAN:  Tiffany Jacobs FREQUENCY: one time visit  Tiffany Jacobs DURATION:   PLANNED INTERVENTIONS: 02835- Tiffany Jacobs Re-evaluation  PLAN FOR NEXT SESSION: One time visit  Omega JONETTA Donna ALMETA, DPT [facility] [place] [address] [phone] office   Omega JONETTA Donna, Tiffany Jacobs 03/14/2023, 3:21 PM

## 2023-03-15 DIAGNOSIS — E1165 Type 2 diabetes mellitus with hyperglycemia: Secondary | ICD-10-CM | POA: Diagnosis not present

## 2023-03-16 ENCOUNTER — Encounter (HOSPITAL_COMMUNITY): Payer: Medicare Other | Admitting: Occupational Therapy

## 2023-03-21 ENCOUNTER — Encounter (HOSPITAL_COMMUNITY): Payer: Medicare Other | Admitting: Occupational Therapy

## 2023-03-22 ENCOUNTER — Other Ambulatory Visit: Payer: Self-pay | Admitting: Family Medicine

## 2023-03-22 ENCOUNTER — Other Ambulatory Visit: Payer: Self-pay | Admitting: Orthopedic Surgery

## 2023-03-22 DIAGNOSIS — M542 Cervicalgia: Secondary | ICD-10-CM

## 2023-03-23 DIAGNOSIS — E1165 Type 2 diabetes mellitus with hyperglycemia: Secondary | ICD-10-CM | POA: Diagnosis not present

## 2023-03-24 ENCOUNTER — Ambulatory Visit (HOSPITAL_COMMUNITY): Payer: Medicare Other | Admitting: Occupational Therapy

## 2023-03-30 ENCOUNTER — Encounter (HOSPITAL_COMMUNITY): Payer: Medicare Other | Admitting: Occupational Therapy

## 2023-04-05 ENCOUNTER — Encounter (HOSPITAL_COMMUNITY): Payer: Medicare Other | Admitting: Occupational Therapy

## 2023-04-06 DIAGNOSIS — H401112 Primary open-angle glaucoma, right eye, moderate stage: Secondary | ICD-10-CM | POA: Diagnosis not present

## 2023-04-12 ENCOUNTER — Ambulatory Visit (HOSPITAL_COMMUNITY): Payer: Medicare Other | Attending: Orthopedic Surgery | Admitting: Occupational Therapy

## 2023-04-12 ENCOUNTER — Encounter (HOSPITAL_COMMUNITY): Payer: Self-pay | Admitting: Occupational Therapy

## 2023-04-12 DIAGNOSIS — M25512 Pain in left shoulder: Secondary | ICD-10-CM | POA: Diagnosis not present

## 2023-04-12 DIAGNOSIS — M25612 Stiffness of left shoulder, not elsewhere classified: Secondary | ICD-10-CM | POA: Diagnosis not present

## 2023-04-12 DIAGNOSIS — R29898 Other symptoms and signs involving the musculoskeletal system: Secondary | ICD-10-CM | POA: Diagnosis not present

## 2023-04-12 DIAGNOSIS — G8929 Other chronic pain: Secondary | ICD-10-CM | POA: Diagnosis not present

## 2023-04-12 NOTE — Patient Instructions (Signed)

## 2023-04-12 NOTE — Therapy (Signed)
 OUTPATIENT OCCUPATIONAL THERAPY ORTHO EVALUATION  Patient Name: Tiffany Jacobs MRN: [id number] DOB:11-21-1945, 78 y.o., female Today's Date: 04/13/2023   END OF SESSION:  OT End of Session - 04/13/23 1216     Visit Number 1    Number of Visits 8    Date for OT Re-Evaluation 05/19/23    Authorization Type [employer] Medicare    Authorization Time Period requesting 8 visits    OT Start Time 1437    OT Stop Time 1522    OT Time Calculation (min) 45 min    Activity Tolerance Patient tolerated treatment well    Behavior During Therapy [facility] for tasks assessed/performed             Past Medical History:  Diagnosis Date   Arthritis    Asthma 1963   Cataract    Chest pain with high risk for cardiac etiology 08/21/2017   Diabetes (HCC)    Diabetes mellitus, type 2 (HCC) 2004   GERD (gastroesophageal reflux disease) 2004   Hypertension 2004   Low back pain    Obesity    RAD (reactive airway disease)    Sciatica    Seasonal allergies    Past Surgical History:  Procedure Laterality Date   ACROMIO-CLAVICULAR JOINT REPAIR Right 11/09/2012   Procedure: ACROMIO-CLAVICULAR JOINT REPAIR;  Surgeon: Taft FORBES Minerva, MD;  Location: [facility];  Service: Orthopedics;  Laterality: Right;   COLONOSCOPY N/A 10/10/2016   Procedure: COLONOSCOPY;  Surgeon: Harvey Margo LITTIE, MD;  [facility];  Service: Endoscopy;  Laterality: N/A;  10:30   EYE SURGERY Right 05/09/2018   cataract   EYE SURGERY Left 2021   cataract   left breast biopsy for benign disease     SHOULDER ACROMIOPLASTY Right 11/09/2012   Procedure: RIGHT SHOULDER ACROMIOPLASTY;  Surgeon: Taft FORBES Minerva, MD;  Location: [facility];  Service: Orthopedics;  Laterality: Right;   SHOULDER OPEN ROTATOR CUFF REPAIR Right 11/09/2012   Procedure: ROTATOR CUFF REPAIR Right SHOULDER OPEN;  Surgeon: Taft FORBES Minerva, MD;  Location: [facility];  Service: Orthopedics;  Laterality: Right;   total knee arthroplasty left  02/01/2005   Dr.  Minerva   TUBAL LIGATION  1970   Patient Active Problem List   Diagnosis Date Noted   ERRONEOUS ENCOUNTER--DISREGARD 01/11/2023   Chronic pain 10/20/2022   Hypertriglyceridemia 09/07/2022   H/O adenomatous polyp of colon 05/10/2022   Acute foreign body of ear canal, left, initial encounter 05/04/2022   Annual visit for general adult medical examination with abnormal findings 01/09/2022   Unsteady gait when walking 05/16/2021   Anxiety 05/11/2020   History of colonic polyps 03/11/2020   Chronic cough 12/18/2019   Urinary frequency 10/05/2019   Tubular adenoma of colon 12/04/2018   Nocturnal hypoxia 07/03/2014   Allergic rhinitis 03/20/2013   Itching of ear 02/17/2012   Mixed hyperlipidemia 11/20/2010   Cough variant asthma 11/27/2009   Obesity (BMI 30.0-34.9) 06/21/2009   Gastroesophageal reflux disease 06/21/2009   Primary hypertension 03/08/2009   SHOULDER, ARTHRITIS, DEGEN./OSTEO 08/28/2007   SCIATICA 06/28/2007   Type 2 diabetes mellitus with vascular disease (HCC) 11/28/2006    PCP: Shona Norleen PEDLAR, MD REFERRING PROVIDER: Minerva Taft, MD  ONSET DATE: >6 months   REFERRING DIAG: L Shoulder pain  THERAPY DIAG:  Left shoulder pain, unspecified chronicity  Stiffness of left shoulder, not elsewhere classified  Other symptoms and signs involving the musculoskeletal system  Rationale for Evaluation and Treatment: Rehabilitation  SUBJECTIVE:   SUBJECTIVE  STATEMENT: My arm has been giving me a fit, down to my hand Pt accompanied by: self  PERTINENT HISTORY: Pt reporting Left chronic shoulder pain. Pt doesn't think pain comes from neck but MD said its from her neck. Pt reports concerns with intermittent but overall chronic left shoulder pain. Notices 10/10 pain at night when rolling over during sleep, but it is constantly in pain that is described as 'just painful.'  Patient does not experience numbness, tingling, or burning sensation down left upper extremity.    PRECAUTIONS: None  WEIGHT BEARING RESTRICTIONS: No  PAIN:  Are you having pain?  Only when I move - 5/10 nagging pain  FALLS: Has patient fallen in last 6 months? No  PLOF: Independent  PATIENT GOALS: To improve mobility  NEXT MD VISIT: 04/17/23  OBJECTIVE:   HAND DOMINANCE: Right  ADLs: Overall ADLs: Pt unable to bath without mod assist, she also requires min to mod assist for dressing, unable to open items, reports total assist for all IADL's due to weakness and pain.   FUNCTIONAL OUTCOME MEASURES: Upper Extremity Functional Scale (UEFS): 69/80 - 86.3%  UPPER EXTREMITY ROM:       Assessed in seated, er/IR adducted  Active ROM Left eval  Shoulder flexion 58  Shoulder abduction 82  Shoulder internal rotation 90  Shoulder external rotation 15  (Blank rows = not tested)    UPPER EXTREMITY MMT:     Assessed in seated, er/IR adducted  04/12/23: Unable to test due to pain and weakness  MMT Left eval  Shoulder flexion   Shoulder abduction   Shoulder internal rotation   Shoulder external rotation   (Blank rows = not tested)  HAND FUNCTION: Grip strength: Right: 17 lbs; Left: 9 lbs  SENSATION: Pt having constant numbness/tingling in the pinky and ring finger of the L hand  EDEMA: No edema noted this session  OBSERVATIONS: Moderate fascial restrictions along the biceps, scapular region, deltoid, and trapezius.    TODAY'S TREATMENT:                                                                                                                              DATE:   04/12/23 -Measurements/Evaluation -Table Slides: flexion, abduction, er, x10 -Pendulums: 2x30   PATIENT EDUCATION: Education details: Table Slides and Pendulums Person educated: Patient Education method: Explanation, Demonstration, and Handouts Education comprehension: verbalized understanding and returned demonstration  HOME EXERCISE PROGRAM: Table Slides and  Pendulums  GOALS: Goals reviewed with patient? Yes   SHORT TERM GOALS: Target date: 05/19/23  Pt will be provided with and educated on HEP to improve mobility in LUE required for use during ADL completion.   Goal status: INITIAL  2. Pt will decrease pain in LUE to 3/10 or less to improve ability to sleep for 2+  consecutive hours without waking due to pain.   Goal status: INITIAL  2.  Pt will decrease LUE fascial restrictions to min amounts or  less to improve mobility required for functional reaching tasks.   Goal status: INITIAL  3.  Pt will increase LUE A/ROM by 40 degrees to improve ability to use LUE when reaching overhead or behind back during dressing and bathing tasks.   Goal status: INITIAL  4.  Pt will increase LUE strength to 4+/5 or greater to improve ability to use LUE when lifting or carrying items during meal preparation/housework/yardwork tasks.   Goal status: INITIAL  5.  Pt will return to highest level of function using LUE as non-dominant during functional task completion.   Goal status: INITIAL   ASSESSMENT:  CLINICAL IMPRESSION: Patient is a 78 y.o. female who was seen today for occupational therapy evaluation for LUE shoulder pain. Pt presents with increased pain and fascial restrictions, decreased ROM, strength, and functional use of the LUE.   PERFORMANCE DEFICITS: in functional skills including in functional skills including ADLs, IADLs, coordination, tone, ROM, strength, pain, fascial restrictions, muscle spasms, and UE functional use.   IMPAIRMENTS: are limiting patient from ADLs, IADLs, rest and sleep, leisure, and social participation.   COMORBIDITIES: may have co-morbidities  that affects occupational performance. Patient will benefit from skilled OT to address above impairments and improve overall function.  MODIFICATION OR ASSISTANCE TO COMPLETE EVALUATION: Min-Moderate modification of tasks or assist with assess necessary to complete an  evaluation.  OT OCCUPATIONAL PROFILE AND HISTORY: Detailed assessment: Review of records and additional review of physical, cognitive, psychosocial history related to current functional performance.  CLINICAL DECISION MAKING: LOW - limited treatment options, no task modification necessary  REHAB POTENTIAL: Good  EVALUATION COMPLEXITY: Moderate      PLAN:  OT FREQUENCY: 2x/week  OT DURATION: 4 weeks  PLANNED INTERVENTIONS: 97168 OT Re-evaluation, 97535 self care/ADL training, 02889 therapeutic exercise, 97530 therapeutic activity, 97112 neuromuscular re-education, 97140 manual therapy, 97035 ultrasound, 97010 moist heat, 97032 electrical stimulation (manual), passive range of motion, functional mobility training, energy conservation, coping strategies training, patient/family education, and DME and/or AE instructions  RECOMMENDED OTHER SERVICES: N/A  CONSULTED AND AGREED WITH PLAN OF CARE: Patient  PLAN FOR NEXT SESSION: Manual Therapy, P/ROM, Table slides/ball rolls, wall washing, isometrics   Valentin Thelbert NEILA [facility] [phone] Valentin Jillyn Thelbert, OT 04/13/2023, 12:17 PM  [facility] Medicare Auth Request Information  Date of referral: 02/20/23 Referring provider: Margrette Bars, MD Referring diagnosis (ICD 10)? M25.512,G89.29 (ICD-10-CM) - Chronic left shoulder pain  Treatment diagnosis (ICD 10)? (if different than referring diagnosis) M25.512, M25.612, R29.898  Functional Tool Score: UEFS - 69/80  What was this (referring dx) caused by? Ongoing Issue  Lysle of Condition: Chronic (continuous duration > 3 months)   Laterality: Lt  Current Functional Measure Score: Other UEFS- 69/80  Objective measurements identify impairments when they are compared to normal values, the uninvolved extremity, and prior level of function.  [x]  Yes  []  No  Objective assessment of functional ability: Severe functional limitations   Briefly describe symptoms:  Severe pain, lack of ROM and strength, difficulty completing any ADL's independently  How did symptoms start: Suddenly  Average pain intensity:  Last 24 hours: 6/10  Past week: 7/10  How often does the pt experience symptoms? Constantly  How much have the symptoms interfered with usual daily activities? Extremely  How has condition changed since care began at this facility? NA - initial visit  In general, how is the patients overall health? Good   BACK PAIN (STarT Back Screening Tool) No

## 2023-04-14 NOTE — Progress Notes (Signed)
   BP (!) 142/90   Pulse 85   Ht 5' 2.5" (1.588 m)   Wt 170 lb (77.1 kg)   BMI 30.60 kg/m   Body mass index is 30.6 kg/m.  Chief Complaint  Patient presents with   Shoulder Pain    Left     No diagnosis found.  DOI/DOS/ Date: NA  Unchanged

## 2023-04-17 ENCOUNTER — Telehealth (HOSPITAL_COMMUNITY): Payer: Self-pay | Admitting: Occupational Therapy

## 2023-04-17 ENCOUNTER — Ambulatory Visit: Payer: Medicare Other | Admitting: Orthopedic Surgery

## 2023-04-17 ENCOUNTER — Ambulatory Visit (HOSPITAL_COMMUNITY): Payer: Medicare Other | Admitting: Occupational Therapy

## 2023-04-17 ENCOUNTER — Encounter: Payer: Self-pay | Admitting: Orthopedic Surgery

## 2023-04-17 VITALS — BP 142/90 | HR 85 | Ht 62.5 in | Wt 170.0 lb

## 2023-04-17 DIAGNOSIS — M4722 Other spondylosis with radiculopathy, cervical region: Secondary | ICD-10-CM

## 2023-04-17 DIAGNOSIS — M19012 Primary osteoarthritis, left shoulder: Secondary | ICD-10-CM | POA: Diagnosis not present

## 2023-04-17 DIAGNOSIS — G8929 Other chronic pain: Secondary | ICD-10-CM

## 2023-04-17 DIAGNOSIS — M19019 Primary osteoarthritis, unspecified shoulder: Secondary | ICD-10-CM

## 2023-04-17 NOTE — Telephone Encounter (Signed)
 Called pt regarding no-show, no answer, left voicemail requesting call back to confirm next appt on 2/12 at 3:15pm.   Lafonda Piety, OTR/L  [phone] 04/17/23

## 2023-04-17 NOTE — Progress Notes (Signed)
 FOLLOW-UP OFFICE VISIT   Patient: Tiffany Jacobs           Date of Birth: [birth year]           MRN: [id number] Visit Date: 04/17/2023 Requested by: Tiffany Fret, MD [address] [place],  [place] [address] PCP: Tiffany Fret, MD    Encounter Diagnoses  Name Primary?   Chronic left shoulder pain Yes   Glenohumeral arthritis-left    Cervical spondylosis with radiculopathy     Chief Complaint  Patient presents with   Shoulder Pain    Left     Tiffany Jacobs has had physical therapy in her left shoulder.  Her primary complaints are pain deep into the left shoulder joint and some in the posterior shoulder.  The paresthesias and numbness and tingling in the left upper extremity are now only on occasion  She has limited range of motion  We repeated her exam today.  She had passive abduction of 90 degrees  external rotation of 30 degrees and there was pain at terminal range of motion  Forward elevation was painful and only 30 degrees  Cuff function is questionable no evidence of traumatic tear   ASSESSMENT AND PLAN Encounter Diagnoses  Name Primary?   Chronic left shoulder pain Yes   Glenohumeral arthritis-left    Cervical spondylosis with radiculopathy     Plan  Consult in-house for possible left shoulder replacement

## 2023-04-17 NOTE — Patient Instructions (Addendum)
 Need you to get your labs from Dr Del Favia so we can see your A1C level / sign a release when you get to check out

## 2023-04-19 ENCOUNTER — Other Ambulatory Visit: Payer: Self-pay | Admitting: Family Medicine

## 2023-04-19 ENCOUNTER — Other Ambulatory Visit: Payer: Self-pay | Admitting: Internal Medicine

## 2023-04-19 ENCOUNTER — Other Ambulatory Visit: Payer: Self-pay | Admitting: Orthopedic Surgery

## 2023-04-19 ENCOUNTER — Ambulatory Visit (HOSPITAL_COMMUNITY): Payer: Medicare Other | Admitting: Occupational Therapy

## 2023-04-19 ENCOUNTER — Encounter (HOSPITAL_COMMUNITY): Payer: Self-pay | Admitting: Occupational Therapy

## 2023-04-19 DIAGNOSIS — M25512 Pain in left shoulder: Secondary | ICD-10-CM | POA: Diagnosis not present

## 2023-04-19 DIAGNOSIS — R29898 Other symptoms and signs involving the musculoskeletal system: Secondary | ICD-10-CM | POA: Diagnosis not present

## 2023-04-19 DIAGNOSIS — M542 Cervicalgia: Secondary | ICD-10-CM

## 2023-04-19 DIAGNOSIS — G8929 Other chronic pain: Secondary | ICD-10-CM | POA: Diagnosis not present

## 2023-04-19 DIAGNOSIS — M25612 Stiffness of left shoulder, not elsewhere classified: Secondary | ICD-10-CM

## 2023-04-19 NOTE — Telephone Encounter (Signed)
Contact pcp

## 2023-04-19 NOTE — Therapy (Unsigned)
OUTPATIENT OCCUPATIONAL THERAPY ORTHO TREATMENT NOTE  Patient Name: Tiffany Jacobs MRN: [id number] DOB:21-Aug-1945, 78 y.o., female Today's Date: 04/20/2023   END OF SESSION:  OT End of Session - 04/19/23 1551     Visit Number 2    Number of Visits 8    Date for OT Re-Evaluation 05/19/23    Authorization Type [employer] Medicare    Authorization Time Period 8 visits approved (04/13/23-05/11/23)    Authorization - Visit Number 1    Authorization - Number of Visits 8    OT Start Time 1516    OT Stop Time 1551    OT Time Calculation (min) 35 min    Activity Tolerance Patient tolerated treatment well    Behavior During Therapy [facility] for tasks assessed/performed              Past Medical History:  Diagnosis Date   Arthritis    Asthma 1963   Cataract    Chest pain with high risk for cardiac etiology 08/21/2017   Diabetes (HCC)    Diabetes mellitus, type 2 (HCC) 2004   GERD (gastroesophageal reflux disease) 2004   Hypertension 2004   Low back pain    Obesity    RAD (reactive airway disease)    Sciatica    Seasonal allergies    Past Surgical History:  Procedure Laterality Date   ACROMIO-CLAVICULAR JOINT REPAIR Right 11/09/2012   Procedure: ACROMIO-CLAVICULAR JOINT REPAIR;  Surgeon: Vickki Hearing, MD;  Location: [facility];  Service: Orthopedics;  Laterality: Right;   COLONOSCOPY N/A 10/10/2016   Procedure: COLONOSCOPY;  Surgeon: West Bali, MD;  Location: [facility];  Service: Endoscopy;  Laterality: N/A;  10:30   EYE SURGERY Right 05/09/2018   cataract   EYE SURGERY Left 2021   cataract   left breast biopsy for benign disease     SHOULDER ACROMIOPLASTY Right 11/09/2012   Procedure: RIGHT SHOULDER ACROMIOPLASTY;  Surgeon: Vickki Hearing, MD;  Location: [facility];  Service: Orthopedics;  Laterality: Right;   SHOULDER OPEN ROTATOR CUFF REPAIR Right 11/09/2012   Procedure: ROTATOR CUFF REPAIR Right SHOULDER OPEN;  Surgeon: Vickki Hearing, MD;  Location: [facility];   Service: Orthopedics;  Laterality: Right;   total knee arthroplasty left  02/01/2005   Dr. Romeo Apple   TUBAL LIGATION  1970   Patient Active Problem List   Diagnosis Date Noted   Neuropathy 02/19/2023   ERRONEOUS ENCOUNTER--DISREGARD 01/11/2023   Chronic pain 10/20/2022   Hypertriglyceridemia 09/07/2022   H/O adenomatous polyp of colon 05/10/2022   Acute foreign body of ear canal, left, initial encounter 05/04/2022   Annual visit for general adult medical examination with abnormal findings 01/09/2022   Unsteady gait when walking 05/16/2021   Anxiety 05/11/2020   History of colonic polyps 03/11/2020   Chronic cough 12/18/2019   Urinary frequency 10/05/2019   Tubular adenoma of colon 12/04/2018   Nocturnal hypoxia 07/03/2014   Allergic rhinitis 03/20/2013   Itching of ear 02/17/2012   Mixed hyperlipidemia 11/20/2010   Cough variant asthma 11/27/2009   Obesity (BMI 30.0-34.9) 06/21/2009   Gastroesophageal reflux disease 06/21/2009   Primary hypertension 03/08/2009   SHOULDER, ARTHRITIS, DEGEN./OSTEO 08/28/2007   SCIATICA 06/28/2007   Type 2 diabetes mellitus with vascular disease (HCC) 11/28/2006    PCP: Benita Stabile, MD REFERRING PROVIDER: Fuller Canada, MD  ONSET DATE: >6 months   REFERRING DIAG: L Shoulder pain  THERAPY DIAG:  Left shoulder pain, unspecified chronicity  Stiffness of left shoulder,  not elsewhere classified  Other symptoms and signs involving the musculoskeletal system  Rationale for Evaluation and Treatment: Rehabilitation  SUBJECTIVE:   SUBJECTIVE STATEMENT: "My arm has been giving me a fit, down to my hand" Pt accompanied by: self  PERTINENT HISTORY: Pt reporting Left chronic shoulder pain. Pt doesn't think pain comes from neck but MD said its from her neck. Pt reports concerns with intermittent but overall chronic left shoulder pain. Notices 10/10 pain at night when rolling over during sleep, but it is constantly in pain that is described  as 'just painful.'  Patient does not experience numbness, tingling, or burning sensation down left upper extremity.   PRECAUTIONS: None  WEIGHT BEARING RESTRICTIONS: No  PAIN:  Are you having pain?  "Only when I move" - 5/10 nagging pain  FALLS: Has patient fallen in last 6 months? No  PLOF: Independent  PATIENT GOALS: "To improve mobility"  NEXT MD VISIT: 04/17/23  OBJECTIVE:   HAND DOMINANCE: Right  ADLs: Overall ADLs: Pt unable to bath without mod assist, she also requires min to mod assist for dressing, unable to open items, reports total assist for all IADL's due to weakness and pain.   FUNCTIONAL OUTCOME MEASURES: Upper Extremity Functional Scale (UEFS): 69/80 - 86.3%  UPPER EXTREMITY ROM:       Assessed in seated, er/IR adducted  Active ROM Left eval  Shoulder flexion 58  Shoulder abduction 82  Shoulder internal rotation 90  Shoulder external rotation 15  (Blank rows = not tested)    UPPER EXTREMITY MMT:     Assessed in seated, er/IR adducted  04/12/23: Unable to test due to pain and weakness  MMT Left eval  Shoulder flexion   Shoulder abduction   Shoulder internal rotation   Shoulder external rotation   (Blank rows = not tested)  HAND FUNCTION: Grip strength: Right: 17 lbs; Left: 9 lbs  SENSATION: Pt having constant numbness/tingling in the pinky and ring finger of the L hand  EDEMA: No edema noted this session  OBSERVATIONS: Moderate fascial restrictions along the biceps, scapular region, deltoid, and trapezius.    TODAY'S TREATMENT:                                                                                                                              DATE:   04/19/23 -Manual therapy: Gentle Myofascial release and trigger point applied to biceps, trapezius, and scapular region in order to reduce facial restrictions and pain, as well as improve ROM -P/ROM: flexion, abduction, er/IR, x6 -Scapular ROM: shoulder shrugs, scapular  retraction, x6 -Table Slides: flexion, abduction, x6  04/12/23 -Measurements/Evaluation -Table Slides: flexion, abduction, er, x10 -Pendulums: 2x30"   PATIENT EDUCATION: Education details: Table Slides and Pendulums Person educated: Patient Education method: Explanation, Demonstration, and Handouts Education comprehension: verbalized understanding and returned demonstration  HOME EXERCISE PROGRAM: Table Slides and Pendulums  GOALS: Goals reviewed with patient? Yes   SHORT TERM GOALS: Target date: 05/19/23  Pt will be provided with and educated on HEP to improve mobility in LUE required for use during ADL completion.   Goal status: IN PROGRESS  2. Pt will decrease pain in LUE to 3/10 or less to improve ability to sleep for 2+  consecutive hours without waking due to pain.   Goal status: IN PROGRESS  2.  Pt will decrease LUE fascial restrictions to min amounts or less to improve mobility required for functional reaching tasks.   Goal status: IN PROGRESS  3.  Pt will increase LUE A/ROM by 40 degrees to improve ability to use LUE when reaching overhead or behind back during dressing and bathing tasks.   Goal status: IN PROGRESS  4.  Pt will increase LUE strength to 4+/5 or greater to improve ability to use LUE when lifting or carrying items during meal preparation/housework/yardwork tasks.   Goal status: IN PROGRESS  5.  Pt will return to highest level of function using LUE as non-dominant during functional task completion.   Goal status: IN PROGRESS   ASSESSMENT:  CLINICAL IMPRESSION: This session, pt presenting to OT session with reports of no pain sitting still. With gentle manual therapy and limited passive exercises pt immediately experiencing 9/10 pain and unable to tolerate continuing any exercises. OT provided education on utilizing hot pads and ice packs to reduce pain and inflammation, as well as topical ointments approved by her MD for pain relief. Session  ended early as pt was unable to tolerate any further movement. Verbal cuing and hands on assist provided consistently throughout session.   PERFORMANCE DEFICITS: in functional skills including in functional skills including ADLs, IADLs, coordination, tone, ROM, strength, pain, fascial restrictions, muscle spasms, and UE functional use.    PLAN:  OT FREQUENCY: 2x/week  OT DURATION: 4 weeks  PLANNED INTERVENTIONS: 97168 OT Re-evaluation, 97535 self care/ADL training, 86578 therapeutic exercise, 97530 therapeutic activity, 97112 neuromuscular re-education, 97140 manual therapy, 97035 ultrasound, 97010 moist heat, 97032 electrical stimulation (manual), passive range of motion, functional mobility training, energy conservation, coping strategies training, patient/family education, and DME and/or AE instructions  RECOMMENDED OTHER SERVICES: N/A  CONSULTED AND AGREED WITH PLAN OF CARE: Patient  PLAN FOR NEXT SESSION: Manual Therapy, P/ROM, Table slides/ball rolls, wall washing, isometrics   Trish Mage, OTR/L [facility] [phone] Marquail Bradwell Rosemarie Beath, OT 04/20/2023, 9:11 AM  [facility] Medicare Auth Request Information  Date of referral: 02/20/23 Referring provider: Fuller Canada, MD Referring diagnosis (ICD 10)? M25.512,G89.29 (ICD-10-CM) - Chronic left shoulder pain  Treatment diagnosis (ICD 10)? (if different than referring diagnosis) M25.512, M25.612, R29.898  Functional Tool Score: UEFS - 69/80  What was this (referring dx) caused by? Ongoing Issue  Ashby Dawes of Condition: Chronic (continuous duration > 3 months)   Laterality: Lt  Current Functional Measure Score: Other UEFS- 69/80  Objective measurements identify impairments when they are compared to normal values, the uninvolved extremity, and prior level of function.  [x]  Yes  []  No  Objective assessment of functional ability: Severe functional limitations   Briefly describe symptoms: Severe pain, lack of  ROM and strength, difficulty completing any ADL's independently  How did symptoms start: Suddenly  Average pain intensity:  Last 24 hours: 6/10  Past week: 7/10  How often does the pt experience symptoms? Constantly  How much have the symptoms interfered with usual daily activities? Extremely  How has condition changed since care began at this facility? NA - initial visit  In general, how is the patients overall health?  Good   BACK PAIN (STarT Back Screening Tool) No

## 2023-04-20 ENCOUNTER — Other Ambulatory Visit: Payer: Self-pay | Admitting: Internal Medicine

## 2023-04-25 ENCOUNTER — Encounter (HOSPITAL_COMMUNITY): Payer: Self-pay | Admitting: Occupational Therapy

## 2023-04-25 ENCOUNTER — Ambulatory Visit (HOSPITAL_COMMUNITY): Payer: Medicare Other | Admitting: Occupational Therapy

## 2023-04-25 DIAGNOSIS — M25512 Pain in left shoulder: Secondary | ICD-10-CM | POA: Diagnosis not present

## 2023-04-25 DIAGNOSIS — R29898 Other symptoms and signs involving the musculoskeletal system: Secondary | ICD-10-CM | POA: Diagnosis not present

## 2023-04-25 DIAGNOSIS — G8929 Other chronic pain: Secondary | ICD-10-CM | POA: Diagnosis not present

## 2023-04-25 DIAGNOSIS — M25612 Stiffness of left shoulder, not elsewhere classified: Secondary | ICD-10-CM | POA: Diagnosis not present

## 2023-04-25 NOTE — Therapy (Signed)
OUTPATIENT OCCUPATIONAL THERAPY ORTHO TREATMENT NOTE  Patient Name: Tiffany Jacobs MRN: [id number] DOB:Jul 21, 1945, 78 y.o., female Today's Date: 04/25/2023   END OF SESSION:  OT End of Session - 04/25/23 1408     Visit Number 3    Number of Visits 8    Date for OT Re-Evaluation 05/19/23    Authorization Type [employer] Medicare    Authorization Time Period 8 visits approved (04/13/23-05/11/23)    Authorization - Visit Number 2    Authorization - Number of Visits 8    OT Start Time 1345    OT Stop Time 1405    OT Time Calculation (min) 20 min    Activity Tolerance Patient tolerated treatment well    Behavior During Therapy [facility] for tasks assessed/performed               Past Medical History:  Diagnosis Date   Arthritis    Asthma 1963   Cataract    Chest pain with high risk for cardiac etiology 08/21/2017   Diabetes (HCC)    Diabetes mellitus, type 2 (HCC) 2004   GERD (gastroesophageal reflux disease) 2004   Hypertension 2004   Low back pain    Obesity    RAD (reactive airway disease)    Sciatica    Seasonal allergies    Past Surgical History:  Procedure Laterality Date   ACROMIO-CLAVICULAR JOINT REPAIR Right 11/09/2012   Procedure: ACROMIO-CLAVICULAR JOINT REPAIR;  Surgeon: Vickki Hearing, MD;  Location: [facility];  Service: Orthopedics;  Laterality: Right;   COLONOSCOPY N/A 10/10/2016   Procedure: COLONOSCOPY;  Surgeon: West Bali, MD;  Location[facility];  Service: Endoscopy;  Laterality: N/A;  10:30   EYE SURGERY Right 05/09/2018   cataract   EYE SURGERY Left 2021   cataract   left breast biopsy for benign disease     SHOULDER ACROMIOPLASTY Right 11/09/2012   Procedure: RIGHT SHOULDER ACROMIOPLASTY;  Surgeon: Vickki Hearing, MD;  Location: [facility];  Service: Orthopedics;  Laterality: Right;   SHOULDER OPEN ROTATOR CUFF REPAIR Right 11/09/2012   Procedure: ROTATOR CUFF REPAIR Right SHOULDER OPEN;  Surgeon: Vickki Hearing, MD;  Location: [facility];   Service: Orthopedics;  Laterality: Right;   total knee arthroplasty left  02/01/2005   Dr. Romeo Apple   TUBAL LIGATION  1970   Patient Active Problem List   Diagnosis Date Noted   Neuropathy 02/19/2023   ERRONEOUS ENCOUNTER--DISREGARD 01/11/2023   Chronic pain 10/20/2022   Hypertriglyceridemia 09/07/2022   H/O adenomatous polyp of colon 05/10/2022   Acute foreign body of ear canal, left, initial encounter 05/04/2022   Annual visit for general adult medical examination with abnormal findings 01/09/2022   Unsteady gait when walking 05/16/2021   Anxiety 05/11/2020   History of colonic polyps 03/11/2020   Chronic cough 12/18/2019   Urinary frequency 10/05/2019   Tubular adenoma of colon 12/04/2018   Nocturnal hypoxia 07/03/2014   Allergic rhinitis 03/20/2013   Itching of ear 02/17/2012   Mixed hyperlipidemia 11/20/2010   Cough variant asthma 11/27/2009   Obesity (BMI 30.0-34.9) 06/21/2009   Gastroesophageal reflux disease 06/21/2009   Primary hypertension 03/08/2009   SHOULDER, ARTHRITIS, DEGEN./OSTEO 08/28/2007   SCIATICA 06/28/2007   Type 2 diabetes mellitus with vascular disease (HCC) 11/28/2006    PCP: Benita Stabile, MD REFERRING PROVIDER: Fuller Canada, MD  ONSET DATE: >6 months   REFERRING DIAG: L Shoulder pain  THERAPY DIAG:  Left shoulder pain, unspecified chronicity  Stiffness of left  shoulder, not elsewhere classified  Other symptoms and signs involving the musculoskeletal system  Rationale for Evaluation and Treatment: Rehabilitation  SUBJECTIVE:   SUBJECTIVE STATEMENT: "I went to the doctor and he introduced me to another doctor that I'm seeing tomorrow."  PERTINENT HISTORY: Pt reporting Left chronic shoulder pain. Pt doesn't think pain comes from neck but MD said its from her neck. Pt reports concerns with intermittent but overall chronic left shoulder pain. Notices 10/10 pain at night when rolling over during sleep, but it is constantly in pain that is  described as 'just painful.'  Patient does not experience numbness, tingling, or burning sensation down left upper extremity.   PRECAUTIONS: None  WEIGHT BEARING RESTRICTIONS: No  PAIN:  Are you having pain?  "Only when I move" - 3/10 nagging pain  FALLS: Has patient fallen in last 6 months? No  PLOF: Independent  PATIENT GOALS: "To improve mobility"  NEXT MD VISIT: 04/25/23  OBJECTIVE:   HAND DOMINANCE: Right  ADLs: Overall ADLs: Pt unable to bath without mod assist, she also requires min to mod assist for dressing, unable to open items, reports total assist for all IADL's due to weakness and pain.   FUNCTIONAL OUTCOME MEASURES: Upper Extremity Functional Scale (UEFS): 69/80 - 86.3%  UPPER EXTREMITY ROM:       Assessed in seated, er/IR adducted  Active ROM Left eval  Shoulder flexion 58  Shoulder abduction 82  Shoulder internal rotation 90  Shoulder external rotation 15  (Blank rows = not tested)    UPPER EXTREMITY MMT:     Assessed in seated, er/IR adducted  04/12/23: Unable to test due to pain and weakness  MMT Left eval  Shoulder flexion   Shoulder abduction   Shoulder internal rotation   Shoulder external rotation   (Blank rows = not tested)  HAND FUNCTION: Grip strength: Right: 17 lbs; Left: 9 lbs  SENSATION: Pt having constant numbness/tingling in the pinky and ring finger of the L hand  EDEMA: No edema noted this session  OBSERVATIONS: Moderate fascial restrictions along the biceps, scapular region, deltoid, and trapezius.    TODAY'S TREATMENT:                                                                                                                              DATE:   04/25/23 -Manual therapy: Gentle Myofascial release and trigger point applied to biceps, trapezius, and scapular region in order to reduce facial restrictions and pain, as well as improve ROM -P/ROM: supine-flexion, 5 reps, discontinued due to pain and  intolerance -Isometrics: supine-flexion, abduction, extension, 3x5" holds -Educated pt regarding surgical consultation with Dr. Dallas Schimke tomorrow. What to expect dependent upon what he finds.  04/19/23 -Manual therapy: Gentle Myofascial release and trigger point applied to biceps, trapezius, and scapular region in order to reduce facial restrictions and pain, as well as improve ROM -P/ROM: flexion, abduction, er/IR, x6 -Scapular ROM: shoulder shrugs, scapular retraction, x6 -Table  Slides: flexion, abduction, x6  04/12/23 -Measurements/Evaluation -Table Slides: flexion, abduction, er, x10 -Pendulums: 2x30"   PATIENT EDUCATION: Education details: reviewed HEP Person educated: Patient Education method: Explanation, Demonstration, and Handouts Education comprehension: verbalized understanding and returned demonstration  HOME EXERCISE PROGRAM: Eval: Table Slides and Pendulums  GOALS: Goals reviewed with patient? Yes   SHORT TERM GOALS: Target date: 05/19/23  Pt will be provided with and educated on HEP to improve mobility in LUE required for use during ADL completion.   Goal status: IN PROGRESS  2. Pt will decrease pain in LUE to 3/10 or less to improve ability to sleep for 2+  consecutive hours without waking due to pain.   Goal status: IN PROGRESS  2.  Pt will decrease LUE fascial restrictions to min amounts or less to improve mobility required for functional reaching tasks.   Goal status: IN PROGRESS  3.  Pt will increase LUE A/ROM by 40 degrees to improve ability to use LUE when reaching overhead or behind back during dressing and bathing tasks.   Goal status: IN PROGRESS  4.  Pt will increase LUE strength to 4+/5 or greater to improve ability to use LUE when lifting or carrying items during meal preparation/housework/yardwork tasks.   Goal status: IN PROGRESS  5.  Pt will return to highest level of function using LUE as non-dominant during functional task completion.    Goal status: IN PROGRESS   ASSESSMENT:  CLINICAL IMPRESSION: Pt presenting that she feels like her shoulder wants to get better but it can't. Pt is scheduled for a surgical consultation tomorrow for possible TSA. Pt with limited tolerance to manual techniques and therapeutic exercises today. Pt with increasing pain indicated via verbal report, tears, grimacing. Stopped exercises, discussed increasing pain and limited tolerance for rehab today. Pt agrees that her pain is high and has limited tolerance for exercises. OT explaining that while we expect therapy exercises to create some soreness, high pain is not a desired expectation. Terminated session today due to patient's poor activity tolerance. Discussed surgical consultation tomorrow. Dependent upon what MD recommends we can continue with therapy or discharge and resume with new referral after surgery if that is the route chosen. Pt verbalized understand and is agreeable to plan.   PERFORMANCE DEFICITS: in functional skills including in functional skills including ADLs, IADLs, coordination, tone, ROM, strength, pain, fascial restrictions, muscle spasms, and UE functional use.    PLAN:  OT FREQUENCY: 2x/week  OT DURATION: 4 weeks  PLANNED INTERVENTIONS: 97168 OT Re-evaluation, 97535 self care/ADL training, 29562 therapeutic exercise, 97530 therapeutic activity, 97112 neuromuscular re-education, 97140 manual therapy, 97035 ultrasound, 97010 moist heat, 97032 electrical stimulation (manual), passive range of motion, functional mobility training, energy conservation, coping strategies training, patient/family education, and DME and/or AE instructions  CONSULTED AND AGREED WITH PLAN OF CARE: Patient  PLAN FOR NEXT SESSION: follow up with pt re: MD appt    Ezra Sites, OTR/L  [phone] 04/25/2023, 2:09 PM

## 2023-04-26 ENCOUNTER — Ambulatory Visit: Payer: Medicare Other | Admitting: Orthopedic Surgery

## 2023-04-28 ENCOUNTER — Ambulatory Visit (HOSPITAL_COMMUNITY): Payer: Medicare Other | Admitting: Occupational Therapy

## 2023-04-28 ENCOUNTER — Encounter (HOSPITAL_COMMUNITY): Payer: Self-pay | Admitting: Occupational Therapy

## 2023-04-28 DIAGNOSIS — M25512 Pain in left shoulder: Secondary | ICD-10-CM

## 2023-04-28 DIAGNOSIS — R29898 Other symptoms and signs involving the musculoskeletal system: Secondary | ICD-10-CM | POA: Diagnosis not present

## 2023-04-28 DIAGNOSIS — M25612 Stiffness of left shoulder, not elsewhere classified: Secondary | ICD-10-CM

## 2023-04-28 DIAGNOSIS — G8929 Other chronic pain: Secondary | ICD-10-CM | POA: Diagnosis not present

## 2023-04-28 NOTE — Therapy (Signed)
 OUTPATIENT OCCUPATIONAL THERAPY ORTHO TREATMENT NOTE  Patient Name: Tiffany Jacobs MRN: [id number] DOB:Sep 18, 1945, 78 y.o., female Today's Date: 05/01/2023   END OF SESSION:   04/28/23 1554  OT Visits / Re-Eval  Visit Number 4  Number of Visits 8  Date for OT Re-Evaluation 05/19/23  Authorization  Authorization Type [employer]  Authorization Time Period 8 visits approved (04/13/23-05/11/23)  Authorization - Visit Number 3  Authorization - Number of Visits 8  OT Time Calculation  OT Start Time 1519  OT Stop Time 1554  OT Time Calculation (min) 35 min  End of Session  Activity Tolerance Patient tolerated treatment well  Behavior During Therapy [facility] for tasks assessed/performed     Past Medical History:  Diagnosis Date   Arthritis    Asthma 1963   Cataract    Chest pain with high risk for cardiac etiology 08/21/2017   Diabetes (HCC)    Diabetes mellitus, type 2 (HCC) 2004   GERD (gastroesophageal reflux disease) 2004   Hypertension 2004   Low back pain    Obesity    RAD (reactive airway disease)    Sciatica    Seasonal allergies    Past Surgical History:  Procedure Laterality Date   ACROMIO-CLAVICULAR JOINT REPAIR Right 11/09/2012   Procedure: ACROMIO-CLAVICULAR JOINT REPAIR;  Surgeon: Vickki Hearing, MD;  Location: [facility];  Service: Orthopedics;  Laterality: Right;   COLONOSCOPY N/A 10/10/2016   Procedure: COLONOSCOPY;  Surgeon: West Bali, MD;  Location: [facility];  Service: Endoscopy;  Laterality: N/A;  10:30   EYE SURGERY Right 05/09/2018   cataract   EYE SURGERY Left 2021   cataract   left breast biopsy for benign disease     SHOULDER ACROMIOPLASTY Right 11/09/2012   Procedure: RIGHT SHOULDER ACROMIOPLASTY;  Surgeon: Vickki Hearing, MD;  Location: [facility];  Service: Orthopedics;  Laterality: Right;   SHOULDER OPEN ROTATOR CUFF REPAIR Right 11/09/2012   Procedure: ROTATOR CUFF REPAIR Right SHOULDER OPEN;  Surgeon: Vickki Hearing, MD;   Location: [facility];  Service: Orthopedics;  Laterality: Right;   total knee arthroplasty left  02/01/2005   Dr. Romeo Apple   TUBAL LIGATION  1970   Patient Active Problem List   Diagnosis Date Noted   Neuropathy 02/19/2023   ERRONEOUS ENCOUNTER--DISREGARD 01/11/2023   Chronic pain 10/20/2022   Hypertriglyceridemia 09/07/2022   H/O adenomatous polyp of colon 05/10/2022   Acute foreign body of ear canal, left, initial encounter 05/04/2022   Annual visit for general adult medical examination with abnormal findings 01/09/2022   Unsteady gait when walking 05/16/2021   Anxiety 05/11/2020   History of colonic polyps 03/11/2020   Chronic cough 12/18/2019   Urinary frequency 10/05/2019   Tubular adenoma of colon 12/04/2018   Nocturnal hypoxia 07/03/2014   Allergic rhinitis 03/20/2013   Itching of ear 02/17/2012   Mixed hyperlipidemia 11/20/2010   Cough variant asthma 11/27/2009   Obesity (BMI 30.0-34.9) 06/21/2009   Gastroesophageal reflux disease 06/21/2009   Primary hypertension 03/08/2009   SHOULDER, ARTHRITIS, DEGEN./OSTEO 08/28/2007   SCIATICA 06/28/2007   Type 2 diabetes mellitus with vascular disease (HCC) 11/28/2006    PCP: Benita Stabile, MD REFERRING PROVIDER: Fuller Canada, MD  ONSET DATE: >6 months   REFERRING DIAG: L Shoulder pain  THERAPY DIAG:  Left shoulder pain, unspecified chronicity  Stiffness of left shoulder, not elsewhere classified  Other symptoms and signs involving the musculoskeletal system  Rationale for Evaluation and Treatment: Rehabilitation  SUBJECTIVE:  SUBJECTIVE STATEMENT: "It hasn't been too bad since the last session"  PERTINENT HISTORY: Pt reporting Left chronic shoulder pain. Pt doesn't think pain comes from neck but MD said its from her neck. Pt reports concerns with intermittent but overall chronic left shoulder pain. Notices 10/10 pain at night when rolling over during sleep, but it is constantly in pain that is described as 'just  painful.'  Patient does not experience numbness, tingling, or burning sensation down left upper extremity.   PRECAUTIONS: None  WEIGHT BEARING RESTRICTIONS: No  PAIN:  Are you having pain?  "Only when I move" - 3/10 nagging pain  FALLS: Has patient fallen in last 6 months? No  PLOF: Independent  PATIENT GOALS: "To improve mobility"  NEXT MD VISIT: 04/25/23  OBJECTIVE:   HAND DOMINANCE: Right  ADLs: Overall ADLs: Pt unable to bath without mod assist, she also requires min to mod assist for dressing, unable to open items, reports total assist for all IADL's due to weakness and pain.   FUNCTIONAL OUTCOME MEASURES: Upper Extremity Functional Scale (UEFS): 69/80 - 86.3%  UPPER EXTREMITY ROM:       Assessed in seated, er/IR adducted  Active ROM Left eval  Shoulder flexion 58  Shoulder abduction 82  Shoulder internal rotation 90  Shoulder external rotation 15  (Blank rows = not tested)    UPPER EXTREMITY MMT:     Assessed in seated, er/IR adducted  04/12/23: Unable to test due to pain and weakness  MMT Left eval  Shoulder flexion   Shoulder abduction   Shoulder internal rotation   Shoulder external rotation   (Blank rows = not tested)  HAND FUNCTION: Grip strength: Right: 17 lbs; Left: 9 lbs  SENSATION: Pt having constant numbness/tingling in the pinky and ring finger of the L hand  EDEMA: No edema noted this session  OBSERVATIONS: Moderate fascial restrictions along the biceps, scapular region, deltoid, and trapezius.    TODAY'S TREATMENT:                                                                                                                              DATE:   04/28/23 -Manual therapy: Gentle Myofascial release and trigger point applied to biceps, trapezius, and scapular region in order to reduce facial restrictions and pain, as well as improve ROM -P/ROM: supine-flexion, abduction, er/IR, x8 -Scapular ROM: shoulder shrugs, scapular retraction,  x6 -Ball Rolls: flexion, abduction, x8  04/25/23 -Manual therapy: Gentle Myofascial release and trigger point applied to biceps, trapezius, and scapular region in order to reduce facial restrictions and pain, as well as improve ROM -P/ROM: supine-flexion, 5 reps, discontinued due to pain and intolerance -Isometrics: supine-flexion, abduction, extension, 3x5" holds -Educated pt regarding surgical consultation with Dr. Dallas Schimke tomorrow. What to expect dependent upon what he finds.  04/19/23 -Manual therapy: Gentle Myofascial release and trigger point applied to biceps, trapezius, and scapular region in order to reduce facial restrictions and pain, as well  as improve ROM -P/ROM: flexion, abduction, er/IR, x6 -Scapular ROM: shoulder shrugs, scapular retraction, x6 -Table Slides: flexion, abduction, x6   PATIENT EDUCATION: Education details: reviewed HEP Person educated: Patient Education method: Explanation, Demonstration, and Handouts Education comprehension: verbalized understanding and returned demonstration  HOME EXERCISE PROGRAM: Eval: Table Slides and Pendulums  GOALS: Goals reviewed with patient? Yes   SHORT TERM GOALS: Target date: 05/19/23  Pt will be provided with and educated on HEP to improve mobility in LUE required for use during ADL completion.   Goal status: IN PROGRESS  2. Pt will decrease pain in LUE to 3/10 or less to improve ability to sleep for 2+  consecutive hours without waking due to pain.   Goal status: IN PROGRESS  2.  Pt will decrease LUE fascial restrictions to min amounts or less to improve mobility required for functional reaching tasks.   Goal status: IN PROGRESS  3.  Pt will increase LUE A/ROM by 40 degrees to improve ability to use LUE when reaching overhead or behind back during dressing and bathing tasks.   Goal status: IN PROGRESS  4.  Pt will increase LUE strength to 4+/5 or greater to improve ability to use LUE when lifting or carrying  items during meal preparation/housework/yardwork tasks.   Goal status: IN PROGRESS  5.  Pt will return to highest level of function using LUE as non-dominant during functional task completion.   Goal status: IN PROGRESS   ASSESSMENT:  CLINICAL IMPRESSION: Pt has not seen her surgical consultation at this time, she is scheduled to meet with MD on 2/25. She continues to have severely limited tolerance and severe pain with all tactile input and ROM. Session had to end early due to severe pain and decreased tolerance. Sessions cancelled until surgical consultation due to severity of pain and low activity tolerance.  Dependent upon what MD recommends we can continue with therapy or discharge and resume with new referral after surgery if that is the route chosen. Pt verbalized understand and is agreeable to plan.   PERFORMANCE DEFICITS: in functional skills including in functional skills including ADLs, IADLs, coordination, tone, ROM, strength, pain, fascial restrictions, muscle spasms, and UE functional use.    PLAN:  OT FREQUENCY: 2x/week  OT DURATION: 4 weeks  PLANNED INTERVENTIONS: 97168 OT Re-evaluation, 97535 self care/ADL training, 16109 therapeutic exercise, 97530 therapeutic activity, 97112 neuromuscular re-education, 97140 manual therapy, 97035 ultrasound, 97010 moist heat, 97032 electrical stimulation (manual), passive range of motion, functional mobility training, energy conservation, coping strategies training, patient/family education, and DME and/or AE instructions  CONSULTED AND AGREED WITH PLAN OF CARE: Patient  PLAN FOR NEXT SESSION: follow up with pt re: MD appt    Trish Mage, OTR/L [phone] 05/01/2023, 10:05 AM

## 2023-05-02 ENCOUNTER — Encounter (HOSPITAL_COMMUNITY): Payer: Medicare Other | Admitting: Occupational Therapy

## 2023-05-03 ENCOUNTER — Ambulatory Visit: Payer: Medicare Other | Admitting: Orthopedic Surgery

## 2023-05-03 ENCOUNTER — Encounter: Payer: Self-pay | Admitting: Orthopedic Surgery

## 2023-05-03 VITALS — BP 137/84 | HR 79 | Ht 62.5 in | Wt 169.0 lb

## 2023-05-03 DIAGNOSIS — M19012 Primary osteoarthritis, left shoulder: Secondary | ICD-10-CM

## 2023-05-03 NOTE — Patient Instructions (Signed)

## 2023-05-04 ENCOUNTER — Encounter (HOSPITAL_COMMUNITY): Payer: Medicare Other | Admitting: Occupational Therapy

## 2023-05-04 NOTE — Progress Notes (Signed)
 New Patient Visit  Assessment: Tiffany Jacobs is a 78 y.o. female with the following: 1. Arthritis of left glenohumeral joint  Plan: Dickey Gave has severe left glenohumeral joint arthritis, with bone-on-bone articulation, and cysts within the humeral head.  She has severe pain, and limited motion.  Unfortunately, she is not a good candidate for surgery, as her most recent A1c was greater than 14.  This was discussed with the patient.  She would prefer to avoid surgery, which leaves [place] with symptomatic treatment.  I have offered her a glenohumeral joint injection.  This was completed in clinic today.  Procedure note injection - Left shoulder, ultrasound guidance   Verbal consent was obtained to inject the Left shoulder, glenohumeral joint  Timeout was completed to confirm the site of injection.   Using the ultrasound, the rotator cuff tendons were identified.  The joint space was also identified. The skin was prepped with alcohol and ethyl chloride was sprayed at the injection site.  A 21-gauge needle was used to inject 40 mg of Depo-Medrol and 1% lidocaine (4 cc) into the glenohumeral joint space of the Left shoulder using a posterolateral approach.  The needle was visualized entering the glenohumeral joint, and the medication was also visualized. There were no complications.  A sterile bandage was applied.   Note: In order to accurately identify the placement of the needle, ultrasound was required, to increase the accuracy, and specificity of the injection.      Follow-up: Return if symptoms worsen or fail to improve.  Subjective:  Chief Complaint  Patient presents with   Shoulder Pain    Here to discuss options for Left shoulder and surgery if necessary    History of Present Illness: Tiffany Jacobs is a 78 y.o. female who presents for evaluation of left shoulder pain.  She has had progressively worsening shoulder pain for many years.  No specific recent  injury.  She has been followed by Dr. Romeo Apple.  Medications, subacromial steroid injections and therapy have not been effective.  She is not interested in surgery, but would consider if it would help with her pain.   Review of Systems: No fevers or chills No numbness or tingling No chest pain No shortness of breath No bowel or bladder dysfunction No GI distress No headaches   Medical History:  Past Medical History:  Diagnosis Date   Arthritis    Asthma 1963   Cataract    Chest pain with high risk for cardiac etiology 08/21/2017   Diabetes (HCC)    Diabetes mellitus, type 2 (HCC) 2004   GERD (gastroesophageal reflux disease) 2004   Hypertension 2004   Low back pain    Obesity    RAD (reactive airway disease)    Sciatica    Seasonal allergies     Past Surgical History:  Procedure Laterality Date   ACROMIO-CLAVICULAR JOINT REPAIR Right 11/09/2012   Procedure: ACROMIO-CLAVICULAR JOINT REPAIR;  Surgeon: Vickki Hearing, MD;  Location: [facility];  Service: Orthopedics;  Laterality: Right;   COLONOSCOPY N/A 10/10/2016   Procedure: COLONOSCOPY;  Surgeon: West Bali, MD;  Location[facility];  Service: Endoscopy;  Laterality: N/A;  10:30   EYE SURGERY Right 05/09/2018   cataract   EYE SURGERY Left 2021   cataract   left breast biopsy for benign disease     SHOULDER ACROMIOPLASTY Right 11/09/2012   Procedure: RIGHT SHOULDER ACROMIOPLASTY;  Surgeon: Vickki Hearing, MD;  Location: [facility];  Service:  Orthopedics;  Laterality: Right;   SHOULDER OPEN ROTATOR CUFF REPAIR Right 11/09/2012   Procedure: ROTATOR CUFF REPAIR Right SHOULDER OPEN;  Surgeon: Vickki Hearing, MD;  Location: [facility];  Service: Orthopedics;  Laterality: Right;   total knee arthroplasty left  02/01/2005   Dr. Romeo Apple   TUBAL LIGATION  1970    Family History  Problem Relation Age of Onset   Kidney failure Father    Kidney disease Father    Stroke Brother 35       used drugs , stroke and  heart disease   Diabetes Brother    Hypertension Brother    Cancer Daughter 20       breast    Diabetes Mother    Asthma Other    Lung disease Other    Cancer Other    Heart disease Other    Arthritis Other    Colon cancer Neg Hx    Colon polyps Neg Hx    Social History   Tobacco Use   Smoking status: Former    Current packs/day: 0.00    Average packs/day: 0.5 packs/day for 37.0 years (18.5 ttl pk-yrs)    Types: Cigarettes    Start date: 10/13/1951    Quit date: 10/12/1988    Years since quitting: 34.5   Smokeless tobacco: Never  Vaping Use   Vaping status: Never Used  Substance Use Topics   Alcohol use: No   Drug use: No    Allergies  Allergen Reactions   Ace Inhibitors Cough   Latex Itching and Rash    Current Meds  Medication Sig   albuterol (PROVENTIL) (2.5 MG/3ML) 0.083% nebulizer solution Take 3 mLs (2.5 mg total) by nebulization every 6 (six) hours as needed for wheezing or shortness of breath.   Alcohol Swabs (B-D SINGLE USE SWABS REGULAR) PADS Use to test as directed   amLODipine-atorvastatin (CADUET) 10-40 MG tablet TAKE 1 TABLET BY MOUTH ONCE DAILY   blood glucose meter kit and supplies Two times daily testing (dispense meter based on insurance preference) dx e11.65   famotidine (PEPCID) 20 MG tablet TAKE ONE TABLET BY MOUTH ONCE DAILY AFTER DINNER.   fenofibrate (TRICOR) 145 MG tablet TAKE ONE TABLET BY MOUTH ONCE DAILY.   ferrous sulfate 325 (65 FE) MG tablet Take 325 mg by mouth daily with breakfast.   Fluocinolone Acetonide 0.01 % OIL Instill 5 drops twice daily into both ear(s) for 7 days. Then stop and use as needed for itching.   gabapentin (NEURONTIN) 100 MG capsule Take 1 capsule (100 mg total) by mouth 3 (three) times daily.   gabapentin (NEURONTIN) 300 MG capsule TAKE ONE CAPSULE BY MOUTH EACH NIGHT AT BEDTIME.   glipiZIDE (GLUCOTROL XL) 10 MG 24 hr tablet TAKE 2 TABLETS BY MOUTH WITH BREAKFAST.   glucose blood test strip Use as instructed once  daily dx E11.9   Insulin Syringe-Needle U-100 (BD SAFETYGLIDE INSULIN SYRINGE) 31G X 15/64" 0.3 ML MISC Use to self administer insulin twice daily.   latanoprost (XALATAN) 0.005 % ophthalmic solution SMARTSIG:1 Drop(s) In Eye(s) Every Evening   lidocaine (LIDODERM) 5 % Place 1 patch onto the skin daily. Remove & Discard patch within 12 hours or as directed by MD   meloxicam (MOBIC) 7.5 MG tablet Take 1 tablet (7.5 mg total) by mouth daily.   metFORMIN (GLUCOPHAGE) 1000 MG tablet TAKE (1) TABLET BY MOUTH TWICE DAILY WITH MEALS.   metoprolol tartrate (LOPRESSOR) 25 MG tablet TAKE (1) TABLET BY MOUTH  TWICE DAILY.   nitroGLYCERIN (NITROSTAT) 0.4 MG SL tablet DISSOLVE ONE TABLET UNDER THE TONGUE EVERY 5 MINUTES AS NEEDED FOR CHEST PAIN. DO NOT EXCEED A TOTAL OF 3 DOSES IN 15 MINUTES   pantoprazole (PROTONIX) 20 MG tablet TAKE ONE TABLET BY MOUTH ONCE DAILY.   spironolactone (ALDACTONE) 25 MG tablet TAKE ONE TABLET BY MOUTH ONCE DAILY.   timolol (TIMOPTIC) 0.5 % ophthalmic solution 1 drop 2 (two) times daily.   tiZANidine (ZANAFLEX) 4 MG capsule Take 1 capsule (4 mg total) by mouth 3 (three) times daily as needed for muscle spasms.   tiZANidine (ZANAFLEX) 4 MG tablet Take 1 tablet (4 mg total) by mouth every 6 (six) hours as needed for muscle spasms.   traMADol (ULTRAM) 50 MG tablet Take 50 mg by mouth every 8 (eight) hours as needed.   TRUEplus Lancets 33G MISC TEST BLOOD SUGAR THREE TIMES DAILY    Objective: BP 137/84   Pulse 79   Ht 5' 2.5" (1.588 m)   Wt 169 lb (76.7 kg)   BMI 30.42 kg/m   Physical Exam:  General: Alert and oriented. and No acute distress. Gait: Normal gait.  Left shoulder without deformity.  No swelling.  No redness.  Limited range of motion.  Unable to get her arm above the level of her shoulder.  80 degrees of abduction at her side.  Unable to get her arm to her lumbar spine.  Fingers are warm and well-perfused.  Sensation intact throughout the left upper  extremity.  IMAGING: I personally reviewed images previously obtained in clinic  X-rays of the left shoulder demonstrate severe left glenohumeral joint arthritis.  There are multiple cysts within the humeral head.   New Medications:  No orders of the defined types were placed in this encounter.     Oliver Barre, MD  05/04/2023 5:34 PM

## 2023-05-08 ENCOUNTER — Other Ambulatory Visit: Payer: Self-pay | Admitting: Family Medicine

## 2023-05-09 ENCOUNTER — Other Ambulatory Visit: Payer: Self-pay | Admitting: Family Medicine

## 2023-05-09 ENCOUNTER — Encounter (HOSPITAL_COMMUNITY): Payer: Medicare Other | Admitting: Occupational Therapy

## 2023-05-10 ENCOUNTER — Telehealth (HOSPITAL_COMMUNITY): Payer: Self-pay | Admitting: Occupational Therapy

## 2023-05-10 NOTE — Telephone Encounter (Signed)
 OT called pt and left HIPAA appropriate VM regarding No Show on 05/09/23 and reminding her of her next appointment on 05/11/23. Requesting pt call back and confirm her next appointment.   Trish Mage, OTR/L [facility] [phone]

## 2023-05-11 ENCOUNTER — Encounter (HOSPITAL_COMMUNITY): Payer: Self-pay | Admitting: Occupational Therapy

## 2023-05-11 ENCOUNTER — Ambulatory Visit (HOSPITAL_COMMUNITY): Payer: Medicare Other | Attending: Orthopedic Surgery | Admitting: Occupational Therapy

## 2023-05-11 DIAGNOSIS — M25512 Pain in left shoulder: Secondary | ICD-10-CM | POA: Diagnosis present

## 2023-05-11 DIAGNOSIS — R29898 Other symptoms and signs involving the musculoskeletal system: Secondary | ICD-10-CM | POA: Diagnosis present

## 2023-05-11 DIAGNOSIS — M25612 Stiffness of left shoulder, not elsewhere classified: Secondary | ICD-10-CM | POA: Diagnosis present

## 2023-05-11 NOTE — Therapy (Signed)
 OUTPATIENT OCCUPATIONAL THERAPY ORTHO TREATMENT NOTE REASSESSMENT/RECERTIFICATION  Patient Name: Tiffany Jacobs MRN: [id number] DOB:December 12, 1945, 78 y.o., female Today's Date: 05/12/2023   END OF SESSION:  OT End of Session - 05/11/23 1515     Visit Number 5    Number of Visits 13    Date for OT Re-Evaluation 06/16/23    Authorization Type [employer] Medicare    Authorization Time Period 8 visits approved (04/13/23-05/11/23), requesting 8 more    Authorization - Visit Number 4    Authorization - Number of Visits 8    OT Start Time 1437    OT Stop Time 1515    OT Time Calculation (min) 38 min    Activity Tolerance Patient tolerated treatment well    Behavior During Therapy [facility] for tasks assessed/performed             Past Medical History:  Diagnosis Date   Arthritis    Asthma 1963   Cataract    Chest pain with high risk for cardiac etiology 08/21/2017   Diabetes (HCC)    Diabetes mellitus, type 2 (HCC) 2004   GERD (gastroesophageal reflux disease) 2004   Hypertension 2004   Low back pain    Obesity    RAD (reactive airway disease)    Sciatica    Seasonal allergies    Past Surgical History:  Procedure Laterality Date   ACROMIO-CLAVICULAR JOINT REPAIR Right 11/09/2012   Procedure: ACROMIO-CLAVICULAR JOINT REPAIR;  Surgeon: Vickki Hearing, MD;  Location: [facility];  Service: Orthopedics;  Laterality: Right;   COLONOSCOPY N/A 10/10/2016   Procedure: COLONOSCOPY;  Surgeon: West Bali, MD;  Location: [facility];  Service: Endoscopy;  Laterality: N/A;  10:30   EYE SURGERY Right 05/09/2018   cataract   EYE SURGERY Left 2021   cataract   left breast biopsy for benign disease     SHOULDER ACROMIOPLASTY Right 11/09/2012   Procedure: RIGHT SHOULDER ACROMIOPLASTY;  Surgeon: Vickki Hearing, MD;  Location: [facility];  Service: Orthopedics;  Laterality: Right;   SHOULDER OPEN ROTATOR CUFF REPAIR Right 11/09/2012   Procedure: ROTATOR CUFF REPAIR Right SHOULDER OPEN;   Surgeon: Vickki Hearing, MD;  Location: [facility];  Service: Orthopedics;  Laterality: Right;   total knee arthroplasty left  02/01/2005   Dr. Romeo Apple   TUBAL LIGATION  1970   Patient Active Problem List   Diagnosis Date Noted   Neuropathy 02/19/2023   ERRONEOUS ENCOUNTER--DISREGARD 01/11/2023   Chronic pain 10/20/2022   Hypertriglyceridemia 09/07/2022   H/O adenomatous polyp of colon 05/10/2022   Acute foreign body of ear canal, left, initial encounter 05/04/2022   Annual visit for general adult medical examination with abnormal findings 01/09/2022   Unsteady gait when walking 05/16/2021   Anxiety 05/11/2020   History of colonic polyps 03/11/2020   Chronic cough 12/18/2019   Urinary frequency 10/05/2019   Tubular adenoma of colon 12/04/2018   Nocturnal hypoxia 07/03/2014   Allergic rhinitis 03/20/2013   Itching of ear 02/17/2012   Mixed hyperlipidemia 11/20/2010   Cough variant asthma 11/27/2009   Obesity (BMI 30.0-34.9) 06/21/2009   Gastroesophageal reflux disease 06/21/2009   Primary hypertension 03/08/2009   SHOULDER, ARTHRITIS, DEGEN./OSTEO 08/28/2007   SCIATICA 06/28/2007   Type 2 diabetes mellitus with vascular disease (HCC) 11/28/2006    PCP: Benita Stabile, MD REFERRING PROVIDER: Fuller Canada, MD  ONSET DATE: >6 months   REFERRING DIAG: L Shoulder pain  THERAPY DIAG:  Left shoulder pain, unspecified chronicity  Stiffness  of left shoulder, not elsewhere classified  Other symptoms and signs involving the musculoskeletal system  Rationale for Evaluation and Treatment: Rehabilitation  SUBJECTIVE:   SUBJECTIVE STATEMENT: "The shots helped for a few days"  PERTINENT HISTORY: Pt reporting Left chronic shoulder pain. Pt doesn't think pain comes from neck but MD said its from her neck. Pt reports concerns with intermittent but overall chronic left shoulder pain. Notices 10/10 pain at night when rolling over during sleep, but it is constantly in pain that is  described as 'just painful.'  Patient does not experience numbness, tingling, or burning sensation down left upper extremity.   PRECAUTIONS: None  WEIGHT BEARING RESTRICTIONS: No  PAIN:  Are you having pain?  "Only when I move" - 6/10 nagging/sharp pain  FALLS: Has patient fallen in last 6 months? No  PLOF: Independent  PATIENT GOALS: "To improve mobility"  NEXT MD VISIT: unsure  OBJECTIVE:   HAND DOMINANCE: Right  ADLs: Overall ADLs: Pt unable to bath without mod assist, she also requires min to mod assist for dressing, unable to open items, reports total assist for all IADL's due to weakness and pain.   FUNCTIONAL OUTCOME MEASURES: Upper Extremity Functional Scale (UEFS): 69/80 - 86.3% 05/11/23: 65/80 - 81.25%  UPPER EXTREMITY ROM:       Assessed in seated, er/IR adducted  Active ROM Left eval Left 05/11/23  Shoulder flexion 58 55  Shoulder abduction 82 78  Shoulder internal rotation 90 90  Shoulder external rotation 15 19  (Blank rows = not tested)    UPPER EXTREMITY MMT:     Assessed in seated, er/IR adducted  04/12/23: Unable to test due to pain and weakness 05/12/23: Too much pain to assess  MMT Left eval  Shoulder flexion   Shoulder abduction   Shoulder internal rotation   Shoulder external rotation   (Blank rows = not tested)  HAND FUNCTION: Grip strength: Right: 17 lbs; Left: 9 lbs  SENSATION: Pt having constant numbness/tingling in the pinky and ring finger of the L hand  EDEMA: No edema noted this session  OBSERVATIONS: Moderate fascial restrictions along the biceps, scapular region, deltoid, and trapezius.    TODAY'S TREATMENT:                                                                                                                              DATE:   05/11/23 -Manual therapy: Gentle Myofascial release and trigger point applied to biceps, trapezius, and scapular region in order to reduce facial restrictions and pain, as well as improve  ROM -P/ROM: supine-flexion, abduction, er/IR, x5 -Scapular ROM: shoulder shrugs, scapular retraction, x6 -Assisted pushing and pulling/reaching with slow gentle movements in low flexion and low abduction x10 -A/ROM: wrist circles, elbow flexion/extension, x10  04/28/23 -Manual therapy: Gentle Myofascial release and trigger point applied to biceps, trapezius, and scapular region in order to reduce facial restrictions and pain, as well as improve ROM -P/ROM: supine-flexion, abduction, er/IR,  x8 -Scapular ROM: shoulder shrugs, scapular retraction, x6 -Ball Rolls: flexion, abduction, x8  04/25/23 -Manual therapy: Gentle Myofascial release and trigger point applied to biceps, trapezius, and scapular region in order to reduce facial restrictions and pain, as well as improve ROM -P/ROM: supine-flexion, 5 reps, discontinued due to pain and intolerance -Isometrics: supine-flexion, abduction, extension, 3x5" holds -Educated pt regarding surgical consultation with Dr. Dallas Schimke tomorrow. What to expect dependent upon what he finds.   PATIENT EDUCATION: Education details: Assisted reaching: flexion and abduction Person educated: Patient Education method: Explanation, Demonstration, and Handouts Education comprehension: verbalized understanding and returned demonstration  HOME EXERCISE PROGRAM: Eval: Table Slides and Pendulums 3/6: Assisted reaching: flexion and abduction  GOALS: Goals reviewed with patient? Yes   SHORT TERM GOALS: Target date: 05/19/23  Pt will be provided with and educated on HEP to improve mobility in LUE required for use during ADL completion.   Goal status: IN PROGRESS  2. Pt will decrease pain in LUE to 3/10 or less to improve ability to sleep for 2+  consecutive hours without waking due to pain.   Goal status: IN PROGRESS  2.  Pt will decrease LUE fascial restrictions to min amounts or less to improve mobility required for functional reaching tasks.   Goal status: IN  PROGRESS  3.  Pt will increase LUE A/ROM by 40 degrees to improve ability to use LUE when reaching overhead or behind back during dressing and bathing tasks.   Goal status: IN PROGRESS  4.  Pt will increase LUE strength to 4+/5 or greater to improve ability to use LUE when lifting or carrying items during meal preparation/housework/yardwork tasks.   Goal status: IN PROGRESS  5.  Pt will return to highest level of function using LUE as non-dominant during functional task completion.   Goal status: IN PROGRESS   ASSESSMENT:  CLINICAL IMPRESSION: Pt had her surgical consult since the last session, where the MD discussed that he can't do surgery at this time due to patients high A1C. He provided a steroid injection which helped for a few days, however pain has returned. This session she continued to be severely limited due to pain with all movements. OT provided assisted movements throughout session, as a way to slowly decrease pain and provide mobility to the arm/shoulder. Pt able to tolerate wrist and elbow movements within the frontal plane. Verbal and tactile cuing also provided for positioning and technique, when not assisting movements .  PERFORMANCE DEFICITS: in functional skills including in functional skills including ADLs, IADLs, coordination, tone, ROM, strength, pain, fascial restrictions, muscle spasms, and UE functional use.    PLAN:  OT FREQUENCY: 2x/week  OT DURATION: 4 weeks  PLANNED INTERVENTIONS: 97168 OT Re-evaluation, 97535 self care/ADL training, 96045 therapeutic exercise, 97530 therapeutic activity, 97112 neuromuscular re-education, 97140 manual therapy, 97035 ultrasound, 97010 moist heat, 97032 electrical stimulation (manual), passive range of motion, functional mobility training, energy conservation, coping strategies training, patient/family education, and DME and/or AE instructions  CONSULTED AND AGREED WITH PLAN OF CARE: Patient  PLAN FOR NEXT SESSION: Gentle  Manual, AA/ROM, low level reaching   Trish Mage, OTR/L [phone] 05/12/2023, 7:12 AM   [facility] Medicare Auth Request Information  Date of referral: 02/20/24 Referring provider: Fuller Canada, MD Referring diagnosis (ICD 10)? M25.512, G89.29 Treatment diagnosis (ICD 10)? (if different than referring diagnosis) M25.512, M25.612, R29.898  Functional Tool Score: UEFS: 65/80  What was this (referring dx) caused by? Ongoing Issue  Ashby Dawes of Condition: Chronic (continuous duration > 3  months)   Laterality: Lt  Current Functional Measure Score: Other UEFS: 65/80  Objective measurements identify impairments when they are compared to normal values, the uninvolved extremity, and prior level of function.  [x]  Yes  []  No  Objective assessment of functional ability: Severe functional limitations   Briefly describe symptoms: Pt has severe pain and ROM deficits.  How did symptoms start: suddenly and constant  Average pain intensity:  Last 24 hours: ~6-7/10  Past week: ~7/10  How often does the pt experience symptoms? Constantly  How much have the symptoms interfered with usual daily activities? Quite a bit  How has condition changed since care began at this facility? No change  In general, how is the patients overall health? Good   BACK PAIN (STarT Back Screening Tool) No

## 2023-05-16 ENCOUNTER — Encounter (HOSPITAL_COMMUNITY): Payer: Self-pay | Admitting: Occupational Therapy

## 2023-05-16 ENCOUNTER — Ambulatory Visit (HOSPITAL_COMMUNITY): Admitting: Occupational Therapy

## 2023-05-16 DIAGNOSIS — M25512 Pain in left shoulder: Secondary | ICD-10-CM

## 2023-05-16 DIAGNOSIS — M25612 Stiffness of left shoulder, not elsewhere classified: Secondary | ICD-10-CM

## 2023-05-16 DIAGNOSIS — R29898 Other symptoms and signs involving the musculoskeletal system: Secondary | ICD-10-CM

## 2023-05-16 NOTE — Therapy (Signed)
 OUTPATIENT OCCUPATIONAL THERAPY ORTHO TREATMENT NOTE   Patient Name: Tiffany Jacobs MRN: [id number] DOB:1945-08-16, 78 y.o., female Today's Date: 05/16/2023   END OF SESSION:  OT End of Session - 05/16/23 1606     Visit Number 6    Number of Visits 13    Date for OT Re-Evaluation 06/16/23    Authorization Type [employer] Medicare    Authorization Time Period 8 visits approved (04/13/23-05/11/23), requesting 8 more    Authorization - Visit Number 5    Authorization - Number of Visits 8    OT Start Time 1526    OT Stop Time 1606    OT Time Calculation (min) 40 min    Activity Tolerance Patient tolerated treatment well    Behavior During Therapy [facility] for tasks assessed/performed              Past Medical History:  Diagnosis Date   Arthritis    Asthma 1963   Cataract    Chest pain with high risk for cardiac etiology 08/21/2017   Diabetes (HCC)    Diabetes mellitus, type 2 (HCC) 2004   GERD (gastroesophageal reflux disease) 2004   Hypertension 2004   Low back pain    Obesity    RAD (reactive airway disease)    Sciatica    Seasonal allergies    Past Surgical History:  Procedure Laterality Date   ACROMIO-CLAVICULAR JOINT REPAIR Right 11/09/2012   Procedure: ACROMIO-CLAVICULAR JOINT REPAIR;  Surgeon: Vickki Hearing, MD;  Location: [facility];  Service: Orthopedics;  Laterality: Right;   COLONOSCOPY N/A 10/10/2016   Procedure: COLONOSCOPY;  Surgeon: West Bali, MD;  Location[facility];  Service: Endoscopy;  Laterality: N/A;  10:30   EYE SURGERY Right 05/09/2018   cataract   EYE SURGERY Left 2021   cataract   left breast biopsy for benign disease     SHOULDER ACROMIOPLASTY Right 11/09/2012   Procedure: RIGHT SHOULDER ACROMIOPLASTY;  Surgeon: Vickki Hearing, MD;  Location: [facility];  Service: Orthopedics;  Laterality: Right;   SHOULDER OPEN ROTATOR CUFF REPAIR Right 11/09/2012   Procedure: ROTATOR CUFF REPAIR Right SHOULDER OPEN;  Surgeon: Vickki Hearing, MD;   Location: [facility];  Service: Orthopedics;  Laterality: Right;   total knee arthroplasty left  02/01/2005   Dr. Romeo Apple   TUBAL LIGATION  1970   Patient Active Problem List   Diagnosis Date Noted   Neuropathy 02/19/2023   ERRONEOUS ENCOUNTER--DISREGARD 01/11/2023   Chronic pain 10/20/2022   Hypertriglyceridemia 09/07/2022   H/O adenomatous polyp of colon 05/10/2022   Acute foreign body of ear canal, left, initial encounter 05/04/2022   Annual visit for general adult medical examination with abnormal findings 01/09/2022   Unsteady gait when walking 05/16/2021   Anxiety 05/11/2020   History of colonic polyps 03/11/2020   Chronic cough 12/18/2019   Urinary frequency 10/05/2019   Tubular adenoma of colon 12/04/2018   Nocturnal hypoxia 07/03/2014   Allergic rhinitis 03/20/2013   Itching of ear 02/17/2012   Mixed hyperlipidemia 11/20/2010   Cough variant asthma 11/27/2009   Obesity (BMI 30.0-34.9) 06/21/2009   Gastroesophageal reflux disease 06/21/2009   Primary hypertension 03/08/2009   SHOULDER, ARTHRITIS, DEGEN./OSTEO 08/28/2007   SCIATICA 06/28/2007   Type 2 diabetes mellitus with vascular disease (HCC) 11/28/2006    PCP: Benita Stabile, MD REFERRING PROVIDER: Fuller Canada, MD  ONSET DATE: >6 months   REFERRING DIAG: L Shoulder pain  THERAPY DIAG:  Left shoulder pain, unspecified chronicity  Stiffness of left shoulder, not elsewhere classified  Other symptoms and signs involving the musculoskeletal system  Rationale for Evaluation and Treatment: Rehabilitation  SUBJECTIVE:   SUBJECTIVE STATEMENT: "Now my right arm is starting to hurt"  PERTINENT HISTORY: Pt reporting Left chronic shoulder pain. Pt doesn't think pain comes from neck but MD said its from her neck. Pt reports concerns with intermittent but overall chronic left shoulder pain. Notices 10/10 pain at night when rolling over during sleep, but it is constantly in pain that is described as 'just painful.'   Patient does not experience numbness, tingling, or burning sensation down left upper extremity.   PRECAUTIONS: None  WEIGHT BEARING RESTRICTIONS: No  PAIN:  Are you having pain?  "Only when I move" - 6/10 nagging/sharp pain  FALLS: Has patient fallen in last 6 months? No  PLOF: Independent  PATIENT GOALS: "To improve mobility"  NEXT MD VISIT: unsure  OBJECTIVE:   HAND DOMINANCE: Right  ADLs: Overall ADLs: Pt unable to bath without mod assist, she also requires min to mod assist for dressing, unable to open items, reports total assist for all IADL's due to weakness and pain.   FUNCTIONAL OUTCOME MEASURES: Upper Extremity Functional Scale (UEFS): 69/80 - 86.3% 05/11/23: 65/80 - 81.25%  UPPER EXTREMITY ROM:       Assessed in seated, er/IR adducted  Active ROM Left eval Left 05/11/23  Shoulder flexion 58 55  Shoulder abduction 82 78  Shoulder internal rotation 90 90  Shoulder external rotation 15 19  (Blank rows = not tested)    UPPER EXTREMITY MMT:     Assessed in seated, er/IR adducted  04/12/23: Unable to test due to pain and weakness 05/12/23: Too much pain to assess  MMT Left eval  Shoulder flexion   Shoulder abduction   Shoulder internal rotation   Shoulder external rotation   (Blank rows = not tested)  HAND FUNCTION: Grip strength: Right: 17 lbs; Left: 9 lbs  SENSATION: Pt having constant numbness/tingling in the pinky and ring finger of the L hand  EDEMA: No edema noted this session  OBSERVATIONS: Moderate fascial restrictions along the biceps, scapular region, deltoid, and trapezius.    TODAY'S TREATMENT:                                                                                                                              DATE:   05/16/23 -Manual therapy: Gentle Myofascial release and trigger point applied to biceps, trapezius, and scapular region in order to reduce facial restrictions and pain, as well as improve ROM -P/ROM:  supine-flexion, abduction, er/IR, x5 -Scapular ROM: shoulder shrugs, scapular retraction, x6 -K-taping: 1 strip from 1in superior to elbow crease 50% pull up to anterior shoulder girdle notch. 2nd strip placed along deltoid muscle from lateral epicondyle 50% pull up to lateral aspect of shoulder. -Ball Rolls: flexion, abduction, x10  05/11/23 -Manual therapy: Gentle Myofascial release and trigger point applied to biceps, trapezius,  and scapular region in order to reduce facial restrictions and pain, as well as improve ROM -P/ROM: supine-flexion, abduction, er/IR, x5 -Scapular ROM: shoulder shrugs, scapular retraction, x6 -Assisted pushing and pulling/reaching with slow gentle movements in low flexion and low abduction x10 -A/ROM: wrist circles, elbow flexion/extension, x10  04/28/23 -Manual therapy: Gentle Myofascial release and trigger point applied to biceps, trapezius, and scapular region in order to reduce facial restrictions and pain, as well as improve ROM -P/ROM: supine-flexion, abduction, er/IR, x8 -Scapular ROM: shoulder shrugs, scapular retraction, x6 -Ball Rolls: flexion, abduction, x8   PATIENT EDUCATION: Education details: k-taping Person educated: Patient Education method: Programmer, multimedia, Facilities manager, and Handouts Education comprehension: verbalized understanding and returned demonstration  HOME EXERCISE PROGRAM: Eval: Table Slides and Pendulums 3/6: Assisted reaching: flexion and abduction  GOALS: Goals reviewed with patient? Yes   SHORT TERM GOALS: Target date: 05/19/23  Pt will be provided with and educated on HEP to improve mobility in LUE required for use during ADL completion.   Goal status: IN PROGRESS  2. Pt will decrease pain in LUE to 3/10 or less to improve ability to sleep for 2+  consecutive hours without waking due to pain.   Goal status: IN PROGRESS  2.  Pt will decrease LUE fascial restrictions to min amounts or less to improve mobility required for  functional reaching tasks.   Goal status: IN PROGRESS  3.  Pt will increase LUE A/ROM by 40 degrees to improve ability to use LUE when reaching overhead or behind back during dressing and bathing tasks.   Goal status: IN PROGRESS  4.  Pt will increase LUE strength to 4+/5 or greater to improve ability to use LUE when lifting or carrying items during meal preparation/housework/yardwork tasks.   Goal status: IN PROGRESS  5.  Pt will return to highest level of function using LUE as non-dominant during functional task completion.   Goal status: IN PROGRESS   ASSESSMENT:  CLINICAL IMPRESSION: This session pt continuing to have severe pain with all movements at the shoulder joint. She was able to tolerate manual therapy with increased mild pressure this session, except along the posterior shoulder girdle. OT added k-taping this session to address severe pain and fascial restrictions along the bicep and deltoid, in order to attempt pain relief and muscle support in low level movements. Verbal and tactile cuing provided throughout session for positioning and technique.  PERFORMANCE DEFICITS: in functional skills including in functional skills including ADLs, IADLs, coordination, tone, ROM, strength, pain, fascial restrictions, muscle spasms, and UE functional use.    PLAN:  OT FREQUENCY: 2x/week  OT DURATION: 4 weeks  PLANNED INTERVENTIONS: 97168 OT Re-evaluation, 97535 self care/ADL training, 40981 therapeutic exercise, 97530 therapeutic activity, 97112 neuromuscular re-education, 97140 manual therapy, 97035 ultrasound, 97010 moist heat, 97032 electrical stimulation (manual), passive range of motion, functional mobility training, energy conservation, coping strategies training, patient/family education, and DME and/or AE instructions  CONSULTED AND AGREED WITH PLAN OF CARE: Patient  PLAN FOR NEXT SESSION: Gentle Manual, AA/ROM, low level reaching   Trish Mage,  OTR/L [phone] 05/16/2023, 5:02 PM   [facility] Medicare Auth Request Information  Date of referral: 02/20/24 Referring provider: Fuller Canada, MD Referring diagnosis (ICD 10)? M25.512, G89.29 Treatment diagnosis (ICD 10)? (if different than referring diagnosis) M25.512, M25.612, R29.898  Functional Tool Score: UEFS: 65/80  What was this (referring dx) caused by? Ongoing Issue  Ashby Dawes of Condition: Chronic (continuous duration > 3 months)   Laterality: Lt  Current Functional Measure Score:  Other UEFS: 65/80  Objective measurements identify impairments when they are compared to normal values, the uninvolved extremity, and prior level of function.  [x]  Yes  []  No  Objective assessment of functional ability: Severe functional limitations   Briefly describe symptoms: Pt has severe pain and ROM deficits.  How did symptoms start: suddenly and constant  Average pain intensity:  Last 24 hours: ~6-7/10  Past week: ~7/10  How often does the pt experience symptoms? Constantly  How much have the symptoms interfered with usual daily activities? Quite a bit  How has condition changed since care began at this facility? No change  In general, how is the patients overall health? Good   BACK PAIN (STarT Back Screening Tool) No

## 2023-05-18 ENCOUNTER — Ambulatory Visit (HOSPITAL_COMMUNITY): Admitting: Occupational Therapy

## 2023-05-18 ENCOUNTER — Encounter (HOSPITAL_COMMUNITY): Payer: Self-pay | Admitting: Occupational Therapy

## 2023-05-18 DIAGNOSIS — M25612 Stiffness of left shoulder, not elsewhere classified: Secondary | ICD-10-CM

## 2023-05-18 DIAGNOSIS — R29898 Other symptoms and signs involving the musculoskeletal system: Secondary | ICD-10-CM

## 2023-05-18 DIAGNOSIS — M25512 Pain in left shoulder: Secondary | ICD-10-CM

## 2023-05-18 NOTE — Therapy (Unsigned)
 OUTPATIENT OCCUPATIONAL THERAPY ORTHO TREATMENT NOTE   Patient Name: Tiffany Jacobs MRN: [id number] DOB:[birth year], 78 y.o., female Today's Date: 05/19/2023   END OF SESSION:  OT End of Session - 05/18/23 1602     Visit Number 7    Number of Visits 13    Date for OT Re-Evaluation 06/16/23    Authorization Type [employer] Medicare    Authorization Time Period 8 visits approved (05/16/23-06/13/23)    Authorization - Visit Number 2    Authorization - Number of Visits 8    OT Start Time 1519    OT Stop Time 1602    OT Time Calculation (min) 43 min    Activity Tolerance Patient tolerated treatment well    Behavior During Therapy [facility] for tasks assessed/performed             Past Medical History:  Diagnosis Date   Arthritis    Asthma 1963   Cataract    Chest pain with high risk for cardiac etiology 08/21/2017   Diabetes (HCC)    Diabetes mellitus, type 2 (HCC) 2004   GERD (gastroesophageal reflux disease) 2004   Hypertension 2004   Low back pain    Obesity    RAD (reactive airway disease)    Sciatica    Seasonal allergies    Past Surgical History:  Procedure Laterality Date   ACROMIO-CLAVICULAR JOINT REPAIR Right 11/09/2012   Procedure: ACROMIO-CLAVICULAR JOINT REPAIR;  Surgeon: Vickki Hearing, MD;  Location: [facility];  Service: Orthopedics;  Laterality: Right;   COLONOSCOPY N/A 10/10/2016   Procedure: COLONOSCOPY;  Surgeon: West Bali, MD;  Location: [facility];  Service: Endoscopy;  Laterality: N/A;  10:30   EYE SURGERY Right 05/09/2018   cataract   EYE SURGERY Left 2021   cataract   left breast biopsy for benign disease     SHOULDER ACROMIOPLASTY Right 11/09/2012   Procedure: RIGHT SHOULDER ACROMIOPLASTY;  Surgeon: Vickki Hearing, MD;  Location: [facility];  Service: Orthopedics;  Laterality: Right;   SHOULDER OPEN ROTATOR CUFF REPAIR Right 11/09/2012   Procedure: ROTATOR CUFF REPAIR Right SHOULDER OPEN;  Surgeon: Vickki Hearing, MD;  Location: [facility];   Service: Orthopedics;  Laterality: Right;   total knee arthroplasty left  02/01/2005   Dr. Romeo Apple   TUBAL LIGATION  1970   Patient Active Problem List   Diagnosis Date Noted   Neuropathy 02/19/2023   ERRONEOUS ENCOUNTER--DISREGARD 01/11/2023   Chronic pain 10/20/2022   Hypertriglyceridemia 09/07/2022   H/O adenomatous polyp of colon 05/10/2022   Acute foreign body of ear canal, left, initial encounter 05/04/2022   Annual visit for general adult medical examination with abnormal findings 01/09/2022   Unsteady gait when walking 05/16/2021   Anxiety 05/11/2020   History of colonic polyps 03/11/2020   Chronic cough 12/18/2019   Urinary frequency 10/05/2019   Tubular adenoma of colon 12/04/2018   Nocturnal hypoxia 07/03/2014   Allergic rhinitis 03/20/2013   Itching of ear 02/17/2012   Mixed hyperlipidemia 11/20/2010   Cough variant asthma 11/27/2009   Obesity (BMI 30.0-34.9) 06/21/2009   Gastroesophageal reflux disease 06/21/2009   Primary hypertension 03/08/2009   SHOULDER, ARTHRITIS, DEGEN./OSTEO 08/28/2007   SCIATICA 06/28/2007   Type 2 diabetes mellitus with vascular disease (HCC) 11/28/2006    PCP: Benita Stabile, MD REFERRING PROVIDER: Fuller Canada, MD  ONSET DATE: >6 months   REFERRING DIAG: L Shoulder pain  THERAPY DIAG:  Left shoulder pain, unspecified chronicity  Stiffness of left shoulder,  not elsewhere classified  Other symptoms and signs involving the musculoskeletal system  Rationale for Evaluation and Treatment: Rehabilitation  SUBJECTIVE:   SUBJECTIVE STATEMENT: "Now my right arm is starting to hurt"  PERTINENT HISTORY: Pt reporting Left chronic shoulder pain. Pt doesn't think pain comes from neck but MD said its from her neck. Pt reports concerns with intermittent but overall chronic left shoulder pain. Notices 10/10 pain at night when rolling over during sleep, but it is constantly in pain that is described as 'just painful.'  Patient does not  experience numbness, tingling, or burning sensation down left upper extremity.   PRECAUTIONS: None  WEIGHT BEARING RESTRICTIONS: No  PAIN:  Are you having pain?  "Only when I move" - 6/10 nagging/sharp pain  FALLS: Has patient fallen in last 6 months? No  PLOF: Independent  PATIENT GOALS: "To improve mobility"  NEXT MD VISIT: unsure  OBJECTIVE:   HAND DOMINANCE: Right  ADLs: Overall ADLs: Pt unable to bath without mod assist, she also requires min to mod assist for dressing, unable to open items, reports total assist for all IADL's due to weakness and pain.   FUNCTIONAL OUTCOME MEASURES: Upper Extremity Functional Scale (UEFS): 69/80 - 86.3% 05/11/23: 65/80 - 81.25%  UPPER EXTREMITY ROM:       Assessed in seated, er/IR adducted  Active ROM Left eval Left 05/11/23  Shoulder flexion 58 55  Shoulder abduction 82 78  Shoulder internal rotation 90 90  Shoulder external rotation 15 19  (Blank rows = not tested)    UPPER EXTREMITY MMT:     Assessed in seated, er/IR adducted  04/12/23: Unable to test due to pain and weakness 05/12/23: Too much pain to assess  MMT Left eval  Shoulder flexion   Shoulder abduction   Shoulder internal rotation   Shoulder external rotation   (Blank rows = not tested)  HAND FUNCTION: Grip strength: Right: 17 lbs; Left: 9 lbs  SENSATION: Pt having constant numbness/tingling in the pinky and ring finger of the L hand  EDEMA: No edema noted this session  OBSERVATIONS: Moderate fascial restrictions along the biceps, scapular region, deltoid, and trapezius.    TODAY'S TREATMENT:                                                                                                                              DATE:   05/18/23 -Manual therapy: Gentle Myofascial release and trigger point applied to biceps, trapezius, and scapular region in order to reduce facial restrictions and pain, as well as improve ROM -Wrist ROM: flexion/extension,  ulnar/radial deviation, supination/pronation, x10 -Elbow ROM: flexion/extension, x10 -Isometrics: flexion, IR, abduction, extension, 5x8" -Pulleys: flexion, x12 -Table Slides: flexion, x10  05/16/23 -Manual therapy: Gentle Myofascial release and trigger point applied to biceps, trapezius, and scapular region in order to reduce facial restrictions and pain, as well as improve ROM -P/ROM: supine-flexion, abduction, er/IR, x5 -Scapular ROM: shoulder shrugs, scapular retraction, x6 -K-taping: 1 strip  from 1in superior to elbow crease 50% pull up to anterior shoulder girdle notch. 2nd strip placed along deltoid muscle from lateral epicondyle 50% pull up to lateral aspect of shoulder. -Ball Rolls: flexion, abduction, x10  05/11/23 -Manual therapy: Gentle Myofascial release and trigger point applied to biceps, trapezius, and scapular region in order to reduce facial restrictions and pain, as well as improve ROM -P/ROM: supine-flexion, abduction, er/IR, x5 -Scapular ROM: shoulder shrugs, scapular retraction, x6 -Assisted pushing and pulling/reaching with slow gentle movements in low flexion and low abduction x10 -A/ROM: wrist circles, elbow flexion/extension, x10  PATIENT EDUCATION: Education details: Isometrics Person educated: Patient Education method: Programmer, multimedia, Facilities manager, and Handouts Education comprehension: verbalized understanding and returned demonstration  HOME EXERCISE PROGRAM: Eval: Table Slides and Pendulums 3/6: Assisted reaching: flexion and abduction 3/13: Isometrics  GOALS: Goals reviewed with patient? Yes   SHORT TERM GOALS: Target date: 05/19/23  Pt will be provided with and educated on HEP to improve mobility in LUE required for use during ADL completion.   Goal status: IN PROGRESS  2. Pt will decrease pain in LUE to 3/10 or less to improve ability to sleep for 2+  consecutive hours without waking due to pain.   Goal status: IN PROGRESS  2.  Pt will decrease  LUE fascial restrictions to min amounts or less to improve mobility required for functional reaching tasks.   Goal status: IN PROGRESS  3.  Pt will increase LUE A/ROM by 40 degrees to improve ability to use LUE when reaching overhead or behind back during dressing and bathing tasks.   Goal status: IN PROGRESS  4.  Pt will increase LUE strength to 4+/5 or greater to improve ability to use LUE when lifting or carrying items during meal preparation/housework/yardwork tasks.   Goal status: IN PROGRESS  5.  Pt will return to highest level of function using LUE as non-dominant during functional task completion.   Goal status: IN PROGRESS   ASSESSMENT:  CLINICAL IMPRESSION: Pt continues to have severe pain, however fascial restrictions along the biceps and deltoids were much better this session. Her pain continues to radiate down her arm with moderate fascial restrictions noted this session in the scapular region, triceps, and forearms. Pt was able to tolerate light isometrics this session against OT's hand, along with wrist and elbow ROM. Attempted pulleys for shoulder ROM, however pt was unable to pull her L shoulder past 10-15 degrees flexion. OT providing hands on assist throughout session, along with verbal and tactile cuing for positioning and technique.   PERFORMANCE DEFICITS: in functional skills including in functional skills including ADLs, IADLs, coordination, tone, ROM, strength, pain, fascial restrictions, muscle spasms, and UE functional use.    PLAN:  OT FREQUENCY: 2x/week  OT DURATION: 4 weeks  PLANNED INTERVENTIONS: 97168 OT Re-evaluation, 97535 self care/ADL training, 09811 therapeutic exercise, 97530 therapeutic activity, 97112 neuromuscular re-education, 97140 manual therapy, 97035 ultrasound, 97010 moist heat, 97032 electrical stimulation (manual), passive range of motion, functional mobility training, energy conservation, coping strategies training, patient/family  education, and DME and/or AE instructions  CONSULTED AND AGREED WITH PLAN OF CARE: Patient  PLAN FOR NEXT SESSION: Gentle Manual, AA/ROM, low level reaching   Trish Mage, OTR/L [phone] 05/19/2023, 9:58 AM

## 2023-05-18 NOTE — Patient Instructions (Signed)
  Complete the following 1 a day. Hold for 8-10 seconds. Complete 4-5 sets for each.   1) SHOULDER - ISOMETRIC FLEXION  Gently push your fist forward into a wall with your elbow bent.    2) SHOULDER - ISOMETRIC EXTENSION  Gently push your a bent elbow back into a wall.    3) SHOULDER - ISOMETRIC INTERNAL ROTATION   Gently press your hand into a wall using the palm side of your hand.  Maintain a bent elbow the entire time.        4) SHOULDER - ISOMETRIC ADDUCTION  Gently push your elbow into the side of your body.   5) SHOULDER - ISOMETRIC ABDUCTION  Gently push your elbow out to the side into a wall with your elbow bent.

## 2023-05-22 ENCOUNTER — Other Ambulatory Visit: Payer: Self-pay | Admitting: Orthopedic Surgery

## 2023-05-22 DIAGNOSIS — M542 Cervicalgia: Secondary | ICD-10-CM

## 2023-05-23 ENCOUNTER — Ambulatory Visit (HOSPITAL_COMMUNITY): Admitting: Occupational Therapy

## 2023-05-23 ENCOUNTER — Other Ambulatory Visit: Payer: Self-pay | Admitting: Family Medicine

## 2023-05-23 ENCOUNTER — Encounter (HOSPITAL_COMMUNITY): Payer: Self-pay | Admitting: Occupational Therapy

## 2023-05-23 DIAGNOSIS — M25512 Pain in left shoulder: Secondary | ICD-10-CM | POA: Diagnosis not present

## 2023-05-23 DIAGNOSIS — R29898 Other symptoms and signs involving the musculoskeletal system: Secondary | ICD-10-CM

## 2023-05-23 DIAGNOSIS — M25612 Stiffness of left shoulder, not elsewhere classified: Secondary | ICD-10-CM

## 2023-05-23 NOTE — Therapy (Unsigned)
 OUTPATIENT OCCUPATIONAL THERAPY ORTHO TREATMENT NOTE   Patient Name: Tiffany Jacobs MRN: [id number] DOB:[birth year], 78 y.o., female Today's Date: 05/24/2023   END OF SESSION:  OT End of Session - 05/23/23 1433     Visit Number 8    Number of Visits 13    Date for OT Re-Evaluation 06/16/23    Authorization Type [employer] Medicare    Authorization Time Period 8 visits approved (05/16/23-06/13/23)    Authorization - Visit Number 3    Authorization - Number of Visits 8    OT Start Time 1353    OT Stop Time 1433    OT Time Calculation (min) 40 min    Activity Tolerance Patient tolerated treatment well    Behavior During Therapy [facility] for tasks assessed/performed             Past Medical History:  Diagnosis Date   Arthritis    Asthma 1963   Cataract    Chest pain with high risk for cardiac etiology 08/21/2017   Diabetes (HCC)    Diabetes mellitus, type 2 (HCC) 2004   GERD (gastroesophageal reflux disease) 2004   Hypertension 2004   Low back pain    Obesity    RAD (reactive airway disease)    Sciatica    Seasonal allergies    Past Surgical History:  Procedure Laterality Date   ACROMIO-CLAVICULAR JOINT REPAIR Right 11/09/2012   Procedure: ACROMIO-CLAVICULAR JOINT REPAIR;  Surgeon: Vickki Hearing, MD;  Location: [facility];  Service: Orthopedics;  Laterality: Right;   COLONOSCOPY N/A 10/10/2016   Procedure: COLONOSCOPY;  Surgeon: West Bali, MD;  Location[facility];  Service: Endoscopy;  Laterality: N/A;  10:30   EYE SURGERY Right 05/09/2018   cataract   EYE SURGERY Left 2021   cataract   left breast biopsy for benign disease     SHOULDER ACROMIOPLASTY Right 11/09/2012   Procedure: RIGHT SHOULDER ACROMIOPLASTY;  Surgeon: Vickki Hearing, MD;  Location: [facility];  Service: Orthopedics;  Laterality: Right;   SHOULDER OPEN ROTATOR CUFF REPAIR Right 11/09/2012   Procedure: ROTATOR CUFF REPAIR Right SHOULDER OPEN;  Surgeon: Vickki Hearing, MD;  Location: [facility];   Service: Orthopedics;  Laterality: Right;   total knee arthroplasty left  02/01/2005   Dr. Romeo Apple   TUBAL LIGATION  1970   Patient Active Problem List   Diagnosis Date Noted   Neuropathy 02/19/2023   ERRONEOUS ENCOUNTER--DISREGARD 01/11/2023   Chronic pain 10/20/2022   Hypertriglyceridemia 09/07/2022   H/O adenomatous polyp of colon 05/10/2022   Acute foreign body of ear canal, left, initial encounter 05/04/2022   Annual visit for general adult medical examination with abnormal findings 01/09/2022   Unsteady gait when walking 05/16/2021   Anxiety 05/11/2020   History of colonic polyps 03/11/2020   Chronic cough 12/18/2019   Urinary frequency 10/05/2019   Tubular adenoma of colon 12/04/2018   Nocturnal hypoxia 07/03/2014   Allergic rhinitis 03/20/2013   Itching of ear 02/17/2012   Mixed hyperlipidemia 11/20/2010   Cough variant asthma 11/27/2009   Obesity (BMI 30.0-34.9) 06/21/2009   Gastroesophageal reflux disease 06/21/2009   Primary hypertension 03/08/2009   SHOULDER, ARTHRITIS, DEGEN./OSTEO 08/28/2007   SCIATICA 06/28/2007   Type 2 diabetes mellitus with vascular disease (HCC) 11/28/2006    PCP: Benita Stabile, MD REFERRING PROVIDER: Fuller Canada, MD  ONSET DATE: >6 months   REFERRING DIAG: L Shoulder pain  THERAPY DIAG:  Left shoulder pain, unspecified chronicity  Stiffness of left shoulder,  not elsewhere classified  Other symptoms and signs involving the musculoskeletal system  Rationale for Evaluation and Treatment: Rehabilitation  SUBJECTIVE:   SUBJECTIVE STATEMENT: "My daughter works me hard"  PERTINENT HISTORY: Pt reporting Left chronic shoulder pain. Pt doesn't think pain comes from neck but MD said its from her neck. Pt reports concerns with intermittent but overall chronic left shoulder pain. Notices 10/10 pain at night when rolling over during sleep, but it is constantly in pain that is described as 'just painful.'  Patient does not experience  numbness, tingling, or burning sensation down left upper extremity.   PRECAUTIONS: None  WEIGHT BEARING RESTRICTIONS: No  PAIN:  Are you having pain?  "Only when I move" - 6/10 nagging/sharp pain  FALLS: Has patient fallen in last 6 months? No  PLOF: Independent  PATIENT GOALS: "To improve mobility"  NEXT MD VISIT: unsure  OBJECTIVE:   HAND DOMINANCE: Right  ADLs: Overall ADLs: Pt unable to bath without mod assist, she also requires min to mod assist for dressing, unable to open items, reports total assist for all IADL's due to weakness and pain.   FUNCTIONAL OUTCOME MEASURES: Upper Extremity Functional Scale (UEFS): 69/80 - 86.3% 05/11/23: 65/80 - 81.25%  UPPER EXTREMITY ROM:       Assessed in seated, er/IR adducted  Active ROM Left eval Left 05/11/23  Shoulder flexion 58 55  Shoulder abduction 82 78  Shoulder internal rotation 90 90  Shoulder external rotation 15 19  (Blank rows = not tested)    UPPER EXTREMITY MMT:     Assessed in seated, er/IR adducted  04/12/23: Unable to test due to pain and weakness 05/12/23: Too much pain to assess  MMT Left eval  Shoulder flexion   Shoulder abduction   Shoulder internal rotation   Shoulder external rotation   (Blank rows = not tested)  HAND FUNCTION: Grip strength: Right: 17 lbs; Left: 9 lbs  SENSATION: Pt having constant numbness/tingling in the pinky and ring finger of the L hand  EDEMA: No edema noted this session  OBSERVATIONS: Moderate fascial restrictions along the biceps, scapular region, deltoid, and trapezius.    TODAY'S TREATMENT:                                                                                                                              DATE:   05/23/23 -Manual therapy: Gentle Myofascial release and trigger point applied to biceps, trapezius, and scapular region in order to reduce facial restrictions and pain, as well as improve ROM -Wrist ROM: flexion/extension, ulnar/radial  deviation, supination/pronation, x10 -Elbow ROM: flexion/extension, x10 -AA/ROM: assisted by OT, supine, flexion, abduction, er/IR, x10 -Pulleys: flexion, x12 - OT providing support at elbow to maintain straight arm in order to lift/stretch arm higher.   05/18/23 -Manual therapy: Gentle Myofascial release and trigger point applied to biceps, trapezius, and scapular region in order to reduce facial restrictions and pain, as well as improve ROM -Wrist ROM:  flexion/extension, ulnar/radial deviation, supination/pronation, x10 -Elbow ROM: flexion/extension, x10 -Isometrics: flexion, IR, abduction, extension, 5x8" -Pulleys: flexion, x12 -Table Slides: flexion, x10  05/16/23 -Manual therapy: Gentle Myofascial release and trigger point applied to biceps, trapezius, and scapular region in order to reduce facial restrictions and pain, as well as improve ROM -P/ROM: supine-flexion, abduction, er/IR, x5 -Scapular ROM: shoulder shrugs, scapular retraction, x6 -K-taping: 1 strip from 1in superior to elbow crease 50% pull up to anterior shoulder girdle notch. 2nd strip placed along deltoid muscle from lateral epicondyle 50% pull up to lateral aspect of shoulder. -Ball Rolls: flexion, abduction, x10  PATIENT EDUCATION: Education details: Continue HEP Person educated: Patient Education method: Programmer, multimedia, Demonstration, and Handouts Education comprehension: verbalized understanding and returned demonstration  HOME EXERCISE PROGRAM: Eval: Table Slides and Pendulums 3/6: Assisted reaching: flexion and abduction 3/13: Isometrics  GOALS: Goals reviewed with patient? Yes   SHORT TERM GOALS: Target date: 05/19/23  Pt will be provided with and educated on HEP to improve mobility in LUE required for use during ADL completion.   Goal status: IN PROGRESS  2. Pt will decrease pain in LUE to 3/10 or less to improve ability to sleep for 2+  consecutive hours without waking due to pain.   Goal status: IN  PROGRESS  2.  Pt will decrease LUE fascial restrictions to min amounts or less to improve mobility required for functional reaching tasks.   Goal status: IN PROGRESS  3.  Pt will increase LUE A/ROM by 40 degrees to improve ability to use LUE when reaching overhead or behind back during dressing and bathing tasks.   Goal status: IN PROGRESS  4.  Pt will increase LUE strength to 4+/5 or greater to improve ability to use LUE when lifting or carrying items during meal preparation/housework/yardwork tasks.   Goal status: IN PROGRESS  5.  Pt will return to highest level of function using LUE as non-dominant during functional task completion.   Goal status: IN PROGRESS   ASSESSMENT:  CLINICAL IMPRESSION: Pt's daughter present during this session to learn/watch what therapy sessions look like. Pt continues to have significant pain with all mobility and manual therapy. Continued education provided to pt regarding importance of pushing her movements more to stretch out, as well as the difference between sharp high pain levels and sore lower pain levels. Pt will state she is in 4/10 pain, however unable to push through and improve/complete movements provided. OT remained hands on throughout session for positioning and technique, as well as up to mod assist to encourage improved mobility.   PERFORMANCE DEFICITS: in functional skills including in functional skills including ADLs, IADLs, coordination, tone, ROM, strength, pain, fascial restrictions, muscle spasms, and UE functional use.    PLAN:  OT FREQUENCY: 2x/week  OT DURATION: 4 weeks  PLANNED INTERVENTIONS: 97168 OT Re-evaluation, 97535 self care/ADL training, 30865 therapeutic exercise, 97530 therapeutic activity, 97112 neuromuscular re-education, 97140 manual therapy, 97035 ultrasound, 97010 moist heat, 97032 electrical stimulation (manual), passive range of motion, functional mobility training, energy conservation, coping strategies  training, patient/family education, and DME and/or AE instructions  CONSULTED AND AGREED WITH PLAN OF CARE: Patient  PLAN FOR NEXT SESSION: Gentle Manual, AA/ROM, low level reaching   Trish Mage, OTR/L [phone] 05/24/2023, 8:13 AM

## 2023-05-23 NOTE — Patient Instructions (Signed)
  1) AROM: Lateral Neck Flexion   Slowly tilt head toward one shoulder, then the other. Hold each position ____ seconds. Repeat ____ times per set. Do ____ sets per session. Do ____ sessions per day.  http://orth.exer.us/296   Copyright  VHI. All rights reserved.  2) AROM: Neck Extension   Bend head backward. Hold ____ seconds. Repeat ____ times per set. Do ____ sets per session. Do ____ sessions per day.  http://orth.exer.us/300   Copyright  VHI. All rights reserved.  3) AROM: Neck Flexion   Bend head forward. Hold ____ seconds. Repeat ____ times per set. Do ____ sets per session. Do ____ sessions per day.  http://orth.exer.us/298   Copyright  VHI. All rights reserved.  4) AROM: Neck Rotation   Turn head slowly to look over one shoulder, then the other. Hold each position ____ seconds. Repeat ____ times per set. Do ____ sets per session. Do ____ sessions per day.  http://orth.exer.us/294   Copyright  VHI. All rights reserved.

## 2023-05-25 ENCOUNTER — Encounter (HOSPITAL_COMMUNITY): Payer: Self-pay | Admitting: Occupational Therapy

## 2023-05-25 ENCOUNTER — Ambulatory Visit (HOSPITAL_COMMUNITY): Admitting: Occupational Therapy

## 2023-05-25 DIAGNOSIS — M25512 Pain in left shoulder: Secondary | ICD-10-CM | POA: Diagnosis not present

## 2023-05-25 DIAGNOSIS — M25612 Stiffness of left shoulder, not elsewhere classified: Secondary | ICD-10-CM

## 2023-05-25 DIAGNOSIS — R29898 Other symptoms and signs involving the musculoskeletal system: Secondary | ICD-10-CM

## 2023-05-25 NOTE — Patient Instructions (Signed)

## 2023-05-25 NOTE — Therapy (Unsigned)
 OUTPATIENT OCCUPATIONAL THERAPY ORTHO TREATMENT NOTE   Patient Name: Tiffany Jacobs MRN: [id number] DOB:01-Dec-1945, 78 y.o., female Today's Date: 05/26/2023   END OF SESSION:  OT End of Session - 05/25/23 1601     Visit Number 9    Number of Visits 13    Date for OT Re-Evaluation 06/16/23    Authorization Type [employer] Medicare    Authorization Time Period 8 visits approved (05/16/23-06/13/23)    Authorization - Visit Number 4    Authorization - Number of Visits 8    OT Start Time 1520    OT Stop Time 1600    OT Time Calculation (min) 40 min    Activity Tolerance Patient tolerated treatment well    Behavior During Therapy [facility] for tasks assessed/performed             Past Medical History:  Diagnosis Date   Arthritis    Asthma 1963   Cataract    Chest pain with high risk for cardiac etiology 08/21/2017   Diabetes (HCC)    Diabetes mellitus, type 2 (HCC) 2004   GERD (gastroesophageal reflux disease) 2004   Hypertension 2004   Low back pain    Obesity    RAD (reactive airway disease)    Sciatica    Seasonal allergies    Past Surgical History:  Procedure Laterality Date   ACROMIO-CLAVICULAR JOINT REPAIR Right 11/09/2012   Procedure: ACROMIO-CLAVICULAR JOINT REPAIR;  Surgeon: Vickki Hearing, MD;  Location: [facility];  Service: Orthopedics;  Laterality: Right;   COLONOSCOPY N/A 10/10/2016   Procedure: COLONOSCOPY;  Surgeon: West Bali, MD;  Location: [facility];  Service: Endoscopy;  Laterality: N/A;  10:30   EYE SURGERY Right 05/09/2018   cataract   EYE SURGERY Left 2021   cataract   left breast biopsy for benign disease     SHOULDER ACROMIOPLASTY Right 11/09/2012   Procedure: RIGHT SHOULDER ACROMIOPLASTY;  Surgeon: Vickki Hearing, MD;  Location: [facility];  Service: Orthopedics;  Laterality: Right;   SHOULDER OPEN ROTATOR CUFF REPAIR Right 11/09/2012   Procedure: ROTATOR CUFF REPAIR Right SHOULDER OPEN;  Surgeon: Vickki Hearing, MD;  Location: [facility];   Service: Orthopedics;  Laterality: Right;   total knee arthroplasty left  02/01/2005   Dr. Romeo Apple   TUBAL LIGATION  1970   Patient Active Problem List   Diagnosis Date Noted   Neuropathy 02/19/2023   ERRONEOUS ENCOUNTER--DISREGARD 01/11/2023   Chronic pain 10/20/2022   Hypertriglyceridemia 09/07/2022   H/O adenomatous polyp of colon 05/10/2022   Acute foreign body of ear canal, left, initial encounter 05/04/2022   Annual visit for general adult medical examination with abnormal findings 01/09/2022   Unsteady gait when walking 05/16/2021   Anxiety 05/11/2020   History of colonic polyps 03/11/2020   Chronic cough 12/18/2019   Urinary frequency 10/05/2019   Tubular adenoma of colon 12/04/2018   Nocturnal hypoxia 07/03/2014   Allergic rhinitis 03/20/2013   Itching of ear 02/17/2012   Mixed hyperlipidemia 11/20/2010   Cough variant asthma 11/27/2009   Obesity (BMI 30.0-34.9) 06/21/2009   Gastroesophageal reflux disease 06/21/2009   Primary hypertension 03/08/2009   SHOULDER, ARTHRITIS, DEGEN./OSTEO 08/28/2007   SCIATICA 06/28/2007   Type 2 diabetes mellitus with vascular disease (HCC) 11/28/2006    PCP: Benita Stabile, MD REFERRING PROVIDER: Fuller Canada, MD  ONSET DATE: >6 months   REFERRING DIAG: L Shoulder pain  THERAPY DIAG:  Left shoulder pain, unspecified chronicity  Stiffness of left shoulder,  not elsewhere classified  Other symptoms and signs involving the musculoskeletal system  Rationale for Evaluation and Treatment: Rehabilitation  SUBJECTIVE:   SUBJECTIVE STATEMENT: "I just wish it would stop hurting."  PERTINENT HISTORY: Pt reporting Left chronic shoulder pain. Pt doesn't think pain comes from neck but MD said its from her neck. Pt reports concerns with intermittent but overall chronic left shoulder pain. Notices 10/10 pain at night when rolling over during sleep, but it is constantly in pain that is described as 'just painful.'  Patient does not  experience numbness, tingling, or burning sensation down left upper extremity.   PRECAUTIONS: None  WEIGHT BEARING RESTRICTIONS: No  PAIN:  Are you having pain?  "Only when I move" - 6/10 nagging/sharp pain  FALLS: Has patient fallen in last 6 months? No  PLOF: Independent  PATIENT GOALS: "To improve mobility"  NEXT MD VISIT: unsure  OBJECTIVE:   HAND DOMINANCE: Right  ADLs: Overall ADLs: Pt unable to bath without mod assist, she also requires min to mod assist for dressing, unable to open items, reports total assist for all IADL's due to weakness and pain.   FUNCTIONAL OUTCOME MEASURES: Upper Extremity Functional Scale (UEFS): 69/80 - 86.3% 05/11/23: 65/80 - 81.25%  UPPER EXTREMITY ROM:       Assessed in seated, er/IR adducted  Active ROM Left eval Left 05/11/23  Shoulder flexion 58 55  Shoulder abduction 82 78  Shoulder internal rotation 90 90  Shoulder external rotation 15 19  (Blank rows = not tested)    UPPER EXTREMITY MMT:     Assessed in seated, er/IR adducted  04/12/23: Unable to test due to pain and weakness 05/12/23: Too much pain to assess  MMT Left eval  Shoulder flexion   Shoulder abduction   Shoulder internal rotation   Shoulder external rotation   (Blank rows = not tested)  HAND FUNCTION: Grip strength: Right: 17 lbs; Left: 9 lbs  SENSATION: Pt having constant numbness/tingling in the pinky and ring finger of the L hand  EDEMA: No edema noted this session  OBSERVATIONS: Moderate fascial restrictions along the biceps, scapular region, deltoid, and trapezius.    TODAY'S TREATMENT:                                                                                                                              DATE:   05/26/23 -Pulleys: flexion, x12 - OT providing support at elbow to maintain straight arm in order to lift/stretch arm higher.  -Manual therapy: Gentle Myofascial release and trigger point applied to biceps, trapezius, and scapular  region in order to reduce facial restrictions and pain, as well as improve ROM -Wrist ROM: flexion/extension, ulnar/radial deviation, supination/pronation, x10 -Elbow ROM: flexion/extension, x10 -AA/ROM: supine, flexion, abduction, protraction, horizontal abduction, er/IR, x10 - OT providing assist to keep elbow straight and encouraging pt to reach further  05/23/23 -Manual therapy: Gentle Myofascial release and trigger point applied to biceps, trapezius, and scapular  region in order to reduce facial restrictions and pain, as well as improve ROM -Wrist ROM: flexion/extension, ulnar/radial deviation, supination/pronation, x10 -Elbow ROM: flexion/extension, x10 -AA/ROM: assisted by OT, supine, flexion, abduction, er/IR, x10 -Pulleys: flexion, x12 - OT providing support at elbow to maintain straight arm in order to lift/stretch arm higher.   05/18/23 -Manual therapy: Gentle Myofascial release and trigger point applied to biceps, trapezius, and scapular region in order to reduce facial restrictions and pain, as well as improve ROM -Wrist ROM: flexion/extension, ulnar/radial deviation, supination/pronation, x10 -Elbow ROM: flexion/extension, x10 -Isometrics: flexion, IR, abduction, extension, 5x8" -Pulleys: flexion, x12 -Table Slides: flexion, x10   PATIENT EDUCATION: Education details: Continue HEP Person educated: Patient Education method: Programmer, multimedia, Demonstration, and Handouts Education comprehension: verbalized understanding and returned demonstration  HOME EXERCISE PROGRAM: Eval: Table Slides and Pendulums 3/6: Assisted reaching: flexion and abduction 3/13: Isometrics  GOALS: Goals reviewed with patient? Yes   SHORT TERM GOALS: Target date: 05/19/23  Pt will be provided with and educated on HEP to improve mobility in LUE required for use during ADL completion.   Goal status: IN PROGRESS  2. Pt will decrease pain in LUE to 3/10 or less to improve ability to sleep for 2+   consecutive hours without waking due to pain.   Goal status: IN PROGRESS  2.  Pt will decrease LUE fascial restrictions to min amounts or less to improve mobility required for functional reaching tasks.   Goal status: IN PROGRESS  3.  Pt will increase LUE A/ROM by 40 degrees to improve ability to use LUE when reaching overhead or behind back during dressing and bathing tasks.   Goal status: IN PROGRESS  4.  Pt will increase LUE strength to 4+/5 or greater to improve ability to use LUE when lifting or carrying items during meal preparation/housework/yardwork tasks.   Goal status: IN PROGRESS  5.  Pt will return to highest level of function using LUE as non-dominant during functional task completion.   Goal status: IN PROGRESS   ASSESSMENT:  CLINICAL IMPRESSION: Pt's daughter present during this session to learn/watch what therapy sessions look like to assist with exercises at home. Pt reports that they have been working on exercises daily. She continues to present with severe pain and limited tolerance. OT focusing on having pt keep her elbow straight to ensure that the shoulder is moving instead of just the elbow. With AA/ROM this session she was able to transition to using a dowel, however was unable to achieve more then 35-40% of full ROM. OT providing hands on assist throughout entire session, as well as verbal and tactile cuing for positioning and technique.   PERFORMANCE DEFICITS: in functional skills including in functional skills including ADLs, IADLs, coordination, tone, ROM, strength, pain, fascial restrictions, muscle spasms, and UE functional use.    PLAN:  OT FREQUENCY: 2x/week  OT DURATION: 4 weeks  PLANNED INTERVENTIONS: 97168 OT Re-evaluation, 97535 self care/ADL training, 40981 therapeutic exercise, 97530 therapeutic activity, 97112 neuromuscular re-education, 97140 manual therapy, 97035 ultrasound, 97010 moist heat, 97032 electrical stimulation (manual), passive  range of motion, functional mobility training, energy conservation, coping strategies training, patient/family education, and DME and/or AE instructions  CONSULTED AND AGREED WITH PLAN OF CARE: Patient  PLAN FOR NEXT SESSION: Gentle Manual, AA/ROM, low level reaching   Trish Mage, OTR/L [phone] 05/26/2023, 8:47 AM

## 2023-05-30 ENCOUNTER — Ambulatory Visit (HOSPITAL_COMMUNITY): Admitting: Occupational Therapy

## 2023-05-30 ENCOUNTER — Telehealth (HOSPITAL_COMMUNITY): Payer: Self-pay | Admitting: Occupational Therapy

## 2023-05-30 NOTE — Telephone Encounter (Signed)
 OT called and left HIPAA appropriate voicemail regarding No Show on 05/30/23 at 2:30p. Reminded patient of next visit on 06/01/23.   Trish Mage, OTR/L [facility] [phone]

## 2023-06-01 ENCOUNTER — Encounter (HOSPITAL_COMMUNITY): Payer: Self-pay | Admitting: Occupational Therapy

## 2023-06-01 ENCOUNTER — Ambulatory Visit (HOSPITAL_COMMUNITY): Admitting: Occupational Therapy

## 2023-06-01 DIAGNOSIS — R29898 Other symptoms and signs involving the musculoskeletal system: Secondary | ICD-10-CM

## 2023-06-01 DIAGNOSIS — M25512 Pain in left shoulder: Secondary | ICD-10-CM

## 2023-06-01 DIAGNOSIS — M25612 Stiffness of left shoulder, not elsewhere classified: Secondary | ICD-10-CM

## 2023-06-01 NOTE — Therapy (Signed)
 OUTPATIENT OCCUPATIONAL THERAPY ORTHO TREATMENT NOTE DISCHARGE SUMMARY   Patient Name: Tiffany CASAMENTO MRN: [id number] DOB:[birth year], 78 y.o., female Today's Date: 06/01/2023  OCCUPATIONAL THERAPY DISCHARGE SUMMARY  Visits from Start of Care: 10  Current functional level related to goals / functional outcomes: See below. Discharging due to high pain and inability to tolerate therapy   Remaining deficits: Continued high pain, decreased ROM and strength, limited functional use of LUE   Education / Equipment: HEP   Patient agrees to discharge. Patient goals were not met. Patient is being discharged due to lack of progress..     END OF SESSION:  OT End of Session - 06/01/23 1412     Visit Number 10    Number of Visits 13    Date for OT Re-Evaluation 06/16/23    Authorization Type [employer] Medicare    Authorization Time Period 8 visits approved (05/16/23-06/13/23)    Authorization - Visit Number 5    Authorization - Number of Visits 8    OT Start Time 1354    OT Stop Time 1412    OT Time Calculation (min) 18 min    Activity Tolerance Patient tolerated treatment well    Behavior During Therapy [facility] for tasks assessed/performed              Past Medical History:  Diagnosis Date   Arthritis    Asthma 1963   Cataract    Chest pain with high risk for cardiac etiology 08/21/2017   Diabetes (HCC)    Diabetes mellitus, type 2 (HCC) 2004   GERD (gastroesophageal reflux disease) 2004   Hypertension 2004   Low back pain    Obesity    RAD (reactive airway disease)    Sciatica    Seasonal allergies    Past Surgical History:  Procedure Laterality Date   ACROMIO-CLAVICULAR JOINT REPAIR Right 11/09/2012   Procedure: ACROMIO-CLAVICULAR JOINT REPAIR;  Surgeon: Vickki Hearing, MD;  Location: [facility];  Service: Orthopedics;  Laterality: Right;   COLONOSCOPY N/A 10/10/2016   Procedure: COLONOSCOPY;  Surgeon: West Bali, MD;  Location[facility];  Service: Endoscopy;   Laterality: N/A;  10:30   EYE SURGERY Right 05/09/2018   cataract   EYE SURGERY Left 2021   cataract   left breast biopsy for benign disease     SHOULDER ACROMIOPLASTY Right 11/09/2012   Procedure: RIGHT SHOULDER ACROMIOPLASTY;  Surgeon: Vickki Hearing, MD;  Location: [facility];  Service: Orthopedics;  Laterality: Right;   SHOULDER OPEN ROTATOR CUFF REPAIR Right 11/09/2012   Procedure: ROTATOR CUFF REPAIR Right SHOULDER OPEN;  Surgeon: Vickki Hearing, MD;  Location: [facility];  Service: Orthopedics;  Laterality: Right;   total knee arthroplasty left  02/01/2005   Dr. Romeo Apple   TUBAL LIGATION  1970   Patient Active Problem List   Diagnosis Date Noted   Neuropathy 02/19/2023   ERRONEOUS ENCOUNTER--DISREGARD 01/11/2023   Chronic pain 10/20/2022   Hypertriglyceridemia 09/07/2022   H/O adenomatous polyp of colon 05/10/2022   Acute foreign body of ear canal, left, initial encounter 05/04/2022   Annual visit for general adult medical examination with abnormal findings 01/09/2022   Unsteady gait when walking 05/16/2021   Anxiety 05/11/2020   History of colonic polyps 03/11/2020   Chronic cough 12/18/2019   Urinary frequency 10/05/2019   Tubular adenoma of colon 12/04/2018   Nocturnal hypoxia 07/03/2014   Allergic rhinitis 03/20/2013   Itching of ear 02/17/2012   Mixed hyperlipidemia 11/20/2010  Cough variant asthma 11/27/2009   Obesity (BMI 30.0-34.9) 06/21/2009   Gastroesophageal reflux disease 06/21/2009   Primary hypertension 03/08/2009   SHOULDER, ARTHRITIS, DEGEN./OSTEO 08/28/2007   SCIATICA 06/28/2007   Type 2 diabetes mellitus with vascular disease (HCC) 11/28/2006    PCP: Benita Stabile, MD REFERRING PROVIDER: Fuller Canada, MD  ONSET DATE: >6 months   REFERRING DIAG: L Shoulder pain  THERAPY DIAG:  Left shoulder pain, unspecified chronicity  Stiffness of left shoulder, not elsewhere classified  Other symptoms and signs involving the musculoskeletal  system  Rationale for Evaluation and Treatment: Rehabilitation  SUBJECTIVE:   SUBJECTIVE STATEMENT: "It hurts all the time.   PERTINENT HISTORY: Pt reporting Left chronic shoulder pain. Pt doesn't think pain comes from neck but MD said its from her neck. Pt reports concerns with intermittent but overall chronic left shoulder pain. Notices 10/10 pain at night when rolling over during sleep, but it is constantly in pain that is described as 'just painful.'  Patient does not experience numbness, tingling, or burning sensation down left upper extremity.   PRECAUTIONS: None  WEIGHT BEARING RESTRICTIONS: No  PAIN:  Are you having pain?  "Only when I move" - 9/10 nagging/sharp pain  FALLS: Has patient fallen in last 6 months? No  PLOF: Independent  PATIENT GOALS: "To improve mobility"  NEXT MD VISIT: 08/03/23  OBJECTIVE:   HAND DOMINANCE: Right  ADLs: Overall ADLs: Pt unable to bath without mod assist, she also requires min to mod assist for dressing, unable to open items, reports total assist for all IADL's due to weakness and pain.   FUNCTIONAL OUTCOME MEASURES: Upper Extremity Functional Scale (UEFS): 69/80 - 86.3% 05/11/23: 65/80 - 81.25%  UPPER EXTREMITY ROM:       Assessed in seated, er/IR adducted  Active ROM Left eval Left 05/11/23 Right 06/01/23  Shoulder flexion 58 55 72  Shoulder abduction 82 78 86  Shoulder internal rotation 90 90 90  Shoulder external rotation 15 19 23   (Blank rows = not tested)    UPPER EXTREMITY MMT:     Assessed in seated, er/IR adducted  04/12/23: Unable to test due to pain and weakness 05/12/23: Too much pain to assess  MMT Left eval  Shoulder flexion   Shoulder abduction   Shoulder internal rotation   Shoulder external rotation   (Blank rows = not tested)  HAND FUNCTION: Grip strength: Right: 17 lbs; Left: 9 lbs  SENSATION: Pt having constant numbness/tingling in the pinky and ring finger of the L hand  EDEMA: No edema noted  this session  OBSERVATIONS: Moderate fascial restrictions along the biceps, scapular region, deltoid, and trapezius.    TODAY'S TREATMENT:                                                                                                                              DATE:  06/01/23 -Manual therapy: Gentle Myofascial release and trigger point applied to biceps, trapezius, and  scapular region in order to reduce facial restrictions and pain, as well as improve ROM -P/ROM: supine-flexion, abduction, er, 1 rep due to limited tolerance -AA/ROM: supine-protraction-attempted 1 rep however unable to complete  05/26/23 -Pulleys: flexion, x12 - OT providing support at elbow to maintain straight arm in order to lift/stretch arm higher.  -Manual therapy: Gentle Myofascial release and trigger point applied to biceps, trapezius, and scapular region in order to reduce facial restrictions and pain, as well as improve ROM -Wrist ROM: flexion/extension, ulnar/radial deviation, supination/pronation, x10 -Elbow ROM: flexion/extension, x10 -AA/ROM: supine, flexion, abduction, protraction, horizontal abduction, er/IR, x10 - OT providing assist to keep elbow straight and encouraging pt to reach further  05/23/23 -Manual therapy: Gentle Myofascial release and trigger point applied to biceps, trapezius, and scapular region in order to reduce facial restrictions and pain, as well as improve ROM -Wrist ROM: flexion/extension, ulnar/radial deviation, supination/pronation, x10 -Elbow ROM: flexion/extension, x10 -AA/ROM: assisted by OT, supine, flexion, abduction, er/IR, x10 -Pulleys: flexion, x12 - OT providing support at elbow to maintain straight arm in order to lift/stretch arm higher.    PATIENT EDUCATION: Education details: Discussed pain level with movement, termination of OT services due to inability to tolerate Person educated: Patient Education method: Explanation, Demonstration, and Handouts Education  comprehension: verbalized understanding and returned demonstration  HOME EXERCISE PROGRAM: Eval: Table Slides and Pendulums 3/6: Assisted reaching: flexion and abduction 3/13: Isometrics  GOALS: Goals reviewed with patient? Yes   SHORT TERM GOALS: Target date: 05/19/23  Pt will be provided with and educated on HEP to improve mobility in LUE required for use during ADL completion.   Goal status: NOT MET  2. Pt will decrease pain in LUE to 3/10 or less to improve ability to sleep for 2+  consecutive hours without waking due to pain.   Goal status: NOT MET  2.  Pt will decrease LUE fascial restrictions to min amounts or less to improve mobility required for functional reaching tasks.   Goal status: NOT MET  3.  Pt will increase LUE A/ROM by 40 degrees to improve ability to use LUE when reaching overhead or behind back during dressing and bathing tasks.   Goal status: NOT MET  4.  Pt will increase LUE strength to 4+/5 or greater to improve ability to use LUE when lifting or carrying items during meal preparation/housework/yardwork tasks.   Goal status: NOT MET  5.  Pt will return to highest level of function using LUE as non-dominant during functional task completion.   Goal status: NOT MET   ASSESSMENT:  CLINICAL IMPRESSION: Pt with no pain upon arrival, however with any movement pain is a 9/10. Pt in tears with passive and AA/ROM attempts. Pt with severe pain in her upper arm and tricep regions, reports pain radiating down ulnar nerve distribution to 4th digit. Pt reports she is completing her HEP, but any movement causes high pain that lasts for a while. Pt is unable to use the LUE functionally with any reaching tasks. Measurements taken today with minimal progress and max compensatory leaning from pt during ROM. Recommend discharge from OT and return to MD for further assessment as pt with significantly limited ability to participate and all exercises exacerbating pain. Pt is  agreeable.   PERFORMANCE DEFICITS: in functional skills including in functional skills including ADLs, IADLs, coordination, tone, ROM, strength, pain, fascial restrictions, muscle spasms, and UE functional use.    PLAN:  OT FREQUENCY: 2x/week  OT DURATION: 4 weeks  PLANNED INTERVENTIONS:  40981 OT Re-evaluation, 97535 self care/ADL training, 19147 therapeutic exercise, 97530 therapeutic activity, 97112 neuromuscular re-education, 97140 manual therapy, 97035 ultrasound, 97010 moist heat, 97032 electrical stimulation (manual), passive range of motion, functional mobility training, energy conservation, coping strategies training, patient/family education, and DME and/or AE instructions  CONSULTED AND AGREED WITH PLAN OF CARE: Patient  PLAN FOR NEXT SESSION: N/A-discharge today due to inability to tolerate and symptoms worsening   Ezra Sites, OTR/L  [phone] 06/01/2023, 2:12 PM

## 2023-06-06 ENCOUNTER — Encounter (HOSPITAL_COMMUNITY): Admitting: Occupational Therapy

## 2023-06-06 ENCOUNTER — Ambulatory Visit: Admitting: Orthopedic Surgery

## 2023-06-08 ENCOUNTER — Encounter (HOSPITAL_COMMUNITY): Admitting: Occupational Therapy

## 2023-06-11 ENCOUNTER — Other Ambulatory Visit: Payer: Self-pay | Admitting: Family Medicine

## 2023-06-14 ENCOUNTER — Ambulatory Visit: Admitting: Orthopedic Surgery

## 2023-06-14 ENCOUNTER — Encounter: Payer: Self-pay | Admitting: Orthopedic Surgery

## 2023-06-14 VITALS — BP 137/72 | HR 78 | Ht 62.5 in | Wt 168.0 lb

## 2023-06-14 DIAGNOSIS — M19012 Primary osteoarthritis, left shoulder: Secondary | ICD-10-CM | POA: Diagnosis not present

## 2023-06-14 MED ORDER — TRAMADOL HCL 50 MG PO TABS: 50.0000 mg | ORAL_TABLET | Freq: Two times a day (BID) | ORAL | 0 refills | Status: AC | PRN

## 2023-06-14 NOTE — Patient Instructions (Signed)
 Work on control of diabetes  Please provide updated lab results  We can consider injections if diabetes is not improving

## 2023-06-15 NOTE — Progress Notes (Signed)
 New Patient Visit  Assessment: Tiffany Jacobs is a 78 y.o. female with the following: 1. Arthritis of left glenohumeral joint  Plan: Dickey Gave has severe left glenohumeral joint arthritis, with bone-on-bone articulation, and cysts within the humeral head.  She continues to have a lot of discomfort.  She has been working with physical therapy, but this is making her pain worse.  I recommend against ongoing therapy.  We once again discussed surgery, but her A1c remains elevated.  She is scheduled for some updated labs.  We will monitor for updated lab reports regarding her A1c.  She states understanding.  I have provided her with some tramadol.  She will return to clinic as needed.   Follow-up: Return if symptoms worsen or fail to improve.  Subjective:  Chief Complaint  Patient presents with   Neck Pain    Left arm pain from neck down right arm/ going for therapy making it worse can't take the Tizanidine and the Meloxicam one or both of them made her "leg jump"    History of Present Illness: Tiffany Jacobs is a 78 y.o. female who returns for evaluation of left shoulder pain.  She has severe arthritis in the left shoulder.  This is known.  At the last visit, I proceeded with an ultrasound-guided injection.  Since then, she has been working with therapy.  Unfortunately, therapy has made the pain worse.  She is not having pains radiating distally.  Review of Systems: No fevers or chills No numbness or tingling No chest pain No shortness of breath No bowel or bladder dysfunction No GI distress No headaches   Objective: BP 137/72   Pulse 78   Ht 5' 2.5" (1.588 m)   Wt 168 lb (76.2 kg)   BMI 30.24 kg/m   Physical Exam:  General: Alert and oriented. and No acute distress. Gait: Normal gait.  Left shoulder without deformity.  No swelling.  No redness.  Limited range of motion.  Unable to get her arm above the level of her shoulder.  80 degrees of abduction at  her side.  Unable to get her arm to her lumbar spine.  Fingers are warm and well-perfused.  Sensation intact throughout the left upper extremity.  IMAGING: I personally reviewed images previously obtained in clinic  X-rays of the left shoulder demonstrate severe left glenohumeral joint arthritis.  There are multiple cysts within the humeral head.   New Medications:  Meds ordered this encounter  Medications   traMADol (ULTRAM) 50 MG tablet    Sig: Take 1 tablet (50 mg total) by mouth every 12 (twelve) hours as needed.    Dispense:  30 tablet    Refill:  0      Oliver Barre, MD  06/15/2023 2:00 PM

## 2023-08-03 ENCOUNTER — Ambulatory Visit: Payer: Medicare Other | Admitting: Orthopedic Surgery

## 2023-08-08 ENCOUNTER — Ambulatory Visit: Admitting: Orthopedic Surgery

## 2023-08-15 ENCOUNTER — Ambulatory Visit (INDEPENDENT_AMBULATORY_CARE_PROVIDER_SITE_OTHER): Admitting: Orthopedic Surgery

## 2023-08-15 DIAGNOSIS — M19012 Primary osteoarthritis, left shoulder: Secondary | ICD-10-CM | POA: Diagnosis not present

## 2023-08-15 NOTE — Patient Instructions (Signed)

## 2023-08-16 NOTE — Progress Notes (Signed)
 Return patient Visit  Assessment: Tiffany Jacobs is a 78 y.o. female with the following: 1. Arthritis of left glenohumeral joint  Plan: Tiffany Jacobs has severe left glenohumeral joint arthritis, with bone-on-bone articulation, and cysts within the humeral head.  She remains a poor candidate for surgery, as her A1c is elevated although it has improved.  She states she does not want to surgery.  She would like to try another injection.  This was completed in clinic today.  She will follow-up as needed.  Procedure note injection - Left shoulder, ultrasound guidance   Verbal consent was obtained to inject the Left shoulder, glenohumeral joint  Timeout was completed to confirm the site of injection.   Using the ultrasound, the rotator cuff tendons were identified.  The joint space was also identified. The skin was prepped with alcohol and ethyl chloride was sprayed at the injection site.  A 21-gauge needle was used to inject 40 mg of Depo-Medrol  and 1% lidocaine  (4 cc) into the glenohumeral joint space of the Left shoulder using a posterolateral approach.  The needle was visualized entering the glenohumeral joint, and the medication was also visualized. There were no complications.  A sterile bandage was applied.   Note: In order to accurately identify the placement of the needle, ultrasound was required, to increase the accuracy, and specificity of the injection.      Follow-up: Return if symptoms worsen or fail to improve.  Subjective:  Chief Complaint  Patient presents with   Shoulder Pain    L still bothering her but R is starting cause problems     History of Present Illness: Tiffany Jacobs is a 78 y.o. female who returns for evaluation of left shoulder pain.  She has advanced degenerative changes in the left shoulder.  She notes this.  She has decreased range of motion.  Prior injection provided relief of her pain for a couple of weeks.  She is interested in  repeat injection today.   Review of Systems: No fevers or chills No numbness or tingling No chest pain No shortness of breath No bowel or bladder dysfunction No GI distress No headaches    Objective: There were no vitals taken for this visit.  Physical Exam:  General: Alert and oriented. and No acute distress. Gait: Normal gait.  Left shoulder without deformity.  No swelling.  No redness.  Limited range of motion.  Unable to get her arm above the level of her shoulder.  80 degrees of abduction at her side.  Unable to get her arm to her lumbar spine.  Fingers are warm and well-perfused.  Sensation intact throughout the left upper extremity.  IMAGING: I personally reviewed images previously obtained in clinic  X-rays of the left shoulder demonstrate severe left glenohumeral joint arthritis.  There are multiple cysts within the humeral head.   New Medications:  No orders of the defined types were placed in this encounter.     Tiffany Frater, MD  08/16/2023 8:35 AM

## 2023-08-28 ENCOUNTER — Encounter: Payer: Self-pay | Admitting: Internal Medicine

## 2023-08-28 ENCOUNTER — Ambulatory Visit: Attending: Internal Medicine | Admitting: Internal Medicine

## 2023-08-28 VITALS — BP 126/80 | HR 87 | Ht 62.5 in | Wt 170.4 lb

## 2023-08-28 DIAGNOSIS — M79602 Pain in left arm: Secondary | ICD-10-CM | POA: Insufficient documentation

## 2023-08-28 DIAGNOSIS — I10 Essential (primary) hypertension: Secondary | ICD-10-CM

## 2023-08-28 NOTE — Patient Instructions (Signed)

## 2023-08-28 NOTE — Progress Notes (Signed)
 Cardiology Office Note  Date: 08/28/2023   ID: Tiffany Jacobs, DOB 10-31-1945, MRN [id number]  PCP:  Shona Norleen PEDLAR, MD  Cardiologist:  Preesha Benjamin P Analeigh Aries, MD Electrophysiologist:  None   Reason for Office Visit: Management of hyperlipidemia   History of Present Illness: Tiffany Jacobs is a 78 y.o. female known to have HTN, DM 2, HLD is here for follow-up visit.  Lipid panel from 6/24 reviewed, total cholesterol 313 (elevated), TG 197 (mildly elevated), LDL 223 (significantly elevated).  Currently on atorvastatin  40 mg nightly and fenofibrate  145 mg once daily.  Reviewed lipid panel from May 2025, with normal limits.  She does not have any angina or DOE.  No dizziness, syncope, palpitations or leg swelling.  She has gait issues.  She also reported left arm pain with no chest pain.   Past Medical History:  Diagnosis Date   Arthritis    Asthma 1963   Cataract    Chest pain with high risk for cardiac etiology 08/21/2017   Diabetes (HCC)    Diabetes mellitus, type 2 (HCC) 2004   GERD (gastroesophageal reflux disease) 2004   Hypertension 2004   Low back pain    Obesity    RAD (reactive airway disease)    Sciatica    Seasonal allergies     Past Surgical History:  Procedure Laterality Date   ACROMIO-CLAVICULAR JOINT REPAIR Right 11/09/2012   Procedure: ACROMIO-CLAVICULAR JOINT REPAIR;  Surgeon: Taft FORBES Minerva, MD;  Location: [facility];  Service: Orthopedics;  Laterality: Right;   COLONOSCOPY N/A 10/10/2016   Procedure: COLONOSCOPY;  Surgeon: Harvey Margo LITTIE, MD;  Location: [facility];  Service: Endoscopy;  Laterality: N/A;  10:30   EYE SURGERY Right 05/09/2018   cataract   EYE SURGERY Left 2021   cataract   left breast biopsy for benign disease     SHOULDER ACROMIOPLASTY Right 11/09/2012   Procedure: RIGHT SHOULDER ACROMIOPLASTY;  Surgeon: Taft FORBES Minerva, MD;  Location: [facility];  Service: Orthopedics;  Laterality: Right;   SHOULDER OPEN ROTATOR CUFF REPAIR  Right 11/09/2012   Procedure: ROTATOR CUFF REPAIR Right SHOULDER OPEN;  Surgeon: Taft FORBES Minerva, MD;  Location: [facility];  Service: Orthopedics;  Laterality: Right;   total knee arthroplasty left  02/01/2005   Dr. Minerva   TUBAL LIGATION  1970    Current Outpatient Medications  Medication Sig Dispense Refill   albuterol  (PROVENTIL ) (2.5 MG/3ML) 0.083% nebulizer solution Take 3 mLs (2.5 mg total) by nebulization every 6 (six) hours as needed for wheezing or shortness of breath. 75 mL 5   Alcohol Swabs (B-D SINGLE USE SWABS REGULAR) PADS Use to test as directed 100 each 6   amLODipine -atorvastatin  (CADUET ) 10-40 MG tablet Take 1 tablet by mouth daily. 100 tablet 3   blood glucose meter kit and supplies Two times daily testing (dispense meter based on insurance preference) dx e11.65 1 each 0   famotidine  (PEPCID ) 20 MG tablet TAKE ONE TABLET BY MOUTH ONCE DAILY AFTER DINNER. 30 tablet 0   fenofibrate  (TRICOR ) 145 MG tablet TAKE ONE TABLET BY MOUTH ONCE DAILY. 30 tablet 5   ferrous sulfate 325 (65 FE) MG tablet Take 325 mg by mouth daily with breakfast.     Fluocinolone Acetonide 0.01 % OIL Instill 5 drops twice daily into both ear(s) for 7 days. Then stop and use as needed for itching.     gabapentin  (NEURONTIN ) 100 MG capsule Take 1 capsule (100 mg total) by mouth 3 (three)  times daily. 90 capsule 2   gabapentin  (NEURONTIN ) 300 MG capsule TAKE ONE CAPSULE BY MOUTH EACH NIGHT AT BEDTIME. 90 capsule 0   glipiZIDE  (GLUCOTROL  XL) 10 MG 24 hr tablet TAKE 2 TABLETS BY MOUTH WITH BREAKFAST. 60 tablet 0   glucose blood test strip Use as instructed once daily dx E11.9 50 each 5   Insulin  Syringe-Needle U-100 (BD SAFETYGLIDE INSULIN  SYRINGE) 31G X 15/64 0.3 ML MISC Use to self administer insulin  twice daily. 100 each 3   latanoprost (XALATAN) 0.005 % ophthalmic solution SMARTSIG:1 Drop(s) In Eye(s) Every Evening     lidocaine  (LIDODERM ) 5 % Place 1 patch onto the skin daily. Remove & Discard patch  within 12 hours or as directed by MD 30 patch 0   meloxicam  (MOBIC ) 7.5 MG tablet Take 1 tablet (7.5 mg total) by mouth daily. (Patient not taking: Reported on 06/14/2023) 30 tablet 5   metFORMIN  (GLUCOPHAGE ) 1000 MG tablet Take 1 tablet (1,000 mg total) by mouth daily with breakfast. 100 tablet 3   metoprolol  tartrate (LOPRESSOR ) 25 MG tablet TAKE (1) TABLET BY MOUTH TWICE DAILY. 60 tablet 3   nitroGLYCERIN (NITROSTAT) 0.4 MG SL tablet DISSOLVE ONE TABLET UNDER THE TONGUE EVERY 5 MINUTES AS NEEDED FOR CHEST PAIN. DO NOT EXCEED A TOTAL OF 3 DOSES IN 15 MINUTES  0   pantoprazole  (PROTONIX ) 20 MG tablet TAKE ONE TABLET BY MOUTH ONCE DAILY. 90 tablet 5   spironolactone  (ALDACTONE ) 25 MG tablet TAKE ONE TABLET BY MOUTH ONCE DAILY. 30 tablet 3   timolol (TIMOPTIC) 0.5 % ophthalmic solution 1 drop 2 (two) times daily.     tiZANidine  (ZANAFLEX ) 4 MG capsule Take 1 capsule (4 mg total) by mouth 3 (three) times daily as needed for muscle spasms. (Patient not taking: Reported on 06/14/2023) 30 capsule 1   tiZANidine  (ZANAFLEX ) 4 MG tablet TAKE ONE TABLET BY MOUTH EVERY 6 HOURS AS NEEDED FOR MUSCLE SPASMS (Patient not taking: Reported on 06/14/2023) 30 tablet 0   traMADol  (ULTRAM ) 50 MG tablet Take 1 tablet (50 mg total) by mouth every 12 (twelve) hours as needed. 30 tablet 0   TRUEplus Lancets 33G MISC TEST BLOOD SUGAR THREE TIMES DAILY 300 each 0   No current facility-administered medications for this visit.   Allergies:  Ace inhibitors and Latex   Social History: The patient  reports that she quit smoking about 34 years ago. Her smoking use included cigarettes. She started smoking about 71 years ago. She has a 18.5 pack-year smoking history. She has never used smokeless tobacco. She reports that she does not drink alcohol and does not use drugs.   Family History: The patient's family history includes Arthritis in an other family member; Asthma in an other family member; Cancer in an other family member; Cancer  (age of onset: 62) in her daughter; Diabetes in her brother and mother; Heart disease in an other family member; Hypertension in her brother; Kidney disease in her father; Kidney failure in her father; Lung disease in an other family member; Stroke (age of onset: 50) in her brother.   ROS:  Please see the history of present illness. Otherwise, complete review of systems is positive for none.  All other systems are reviewed and negative.   Physical Exam: VS:  Ht 5' 2.5 (1.588 m)   Wt 170 lb 6.4 oz (77.3 kg)   BMI 30.67 kg/m , BMI Body mass index is 30.67 kg/m.  Wt Readings from Last 3 Encounters:  08/28/23 170  lb 6.4 oz (77.3 kg)  06/14/23 168 lb (76.2 kg)  05/03/23 169 lb (76.7 kg)    General: Patient appears comfortable at rest. HEENT: Conjunctiva and lids normal, oropharynx clear with moist mucosa. Neck: Supple, no elevated JVP or carotid bruits, no thyromegaly. Lungs: Clear to auscultation, nonlabored breathing at rest. Cardiac: Regular rate and rhythm, no S3 or significant systolic murmur, no pericardial rub. Abdomen: Soft, nontender, no hepatomegaly, bowel sounds present, no guarding or rebound. Extremities: No pitting edema, distal pulses 2+. Skin: Warm and dry. Musculoskeletal: No kyphosis. Neuropsychiatric: Alert and oriented x3, affect grossly appropriate.  Recent Labwork: No results found for requested labs within last 365 days.     Component Value Date/Time   CHOL 313 (H) 07/12/2022 1542   TRIG 187 (H) 07/12/2022 1542   HDL 54 07/12/2022 1542   CHOLHDL 5.8 (H) 07/12/2022 1542   CHOLHDL 3.0 05/21/2019 0929   VLDL 20 07/22/2016 1004   LDLCALC 223 (H) 07/12/2022 1542   LDLCALC 107 (H) 05/21/2019 0929     Assessment and Plan:  # Hyperlipidemia, not at goal # Hypertriglyceridemia, not at goal - LDL 223 and TG 187 in May 7975.  Repeat lipid panel in May 2025 showed LDL and TG within normal limits.  Continue atorvastatin  40 mg nightly and fenofibrate  145 mg once  daily.  DM2 well-controlled, HbA1c dropped from 14 to 9.  Follow-up with PCP.  # Left arm pain - She has daily left arm pain for the last 3 months.  No associated chest pain.  She has neck issues.  If she develops chest pain in the future, she will need stress test or CT cardiac if her serum creatinine stays normal.  # HTN, controlled - Continue amlodipine  10 mg once daily, metoprolol  tartrate 25 mg twice daily and spironolactone  20 mg once daily.  Allergy to ACE inhibitors.  Follows with PCP.   Medication Adjustments/Labs and Tests Ordered: Current medicines are reviewed at length with the patient today.  Concerns regarding medicines are outlined above.   Tests Ordered: Orders Placed This Encounter  Procedures   EKG 12-Lead    Medication Changes: No orders of the defined types were placed in this encounter.   Disposition:  Follow up as needed  Signed, Charlaine Utsey Arleta Maywood, MD, 08/28/2023 3:56 PM    [facility] [address], [place], [place] [address]

## 2023-09-06 ENCOUNTER — Other Ambulatory Visit (HOSPITAL_COMMUNITY): Payer: Self-pay | Admitting: Family Medicine

## 2023-09-06 DIAGNOSIS — Z1231 Encounter for screening mammogram for malignant neoplasm of breast: Secondary | ICD-10-CM

## 2023-09-27 ENCOUNTER — Ambulatory Visit (HOSPITAL_COMMUNITY)

## 2023-10-04 ENCOUNTER — Ambulatory Visit (HOSPITAL_COMMUNITY)
Admission: RE | Admit: 2023-10-04 | Discharge: 2023-10-04 | Disposition: A | Discharge status: Home / Self Care | Source: Ambulatory Visit | Attending: Internal Medicine | Admitting: Internal Medicine

## 2023-10-04 DIAGNOSIS — Z1231 Encounter for screening mammogram for malignant neoplasm of breast: Secondary | ICD-10-CM | POA: Diagnosis present

## 2023-11-15 ENCOUNTER — Other Ambulatory Visit (HOSPITAL_COMMUNITY): Payer: Self-pay | Admitting: Internal Medicine

## 2023-11-15 DIAGNOSIS — G629 Polyneuropathy, unspecified: Secondary | ICD-10-CM

## 2023-11-15 DIAGNOSIS — R0989 Other specified symptoms and signs involving the circulatory and respiratory systems: Secondary | ICD-10-CM
